# Patient Record
Sex: Female | Born: 1935
Health system: Southern US, Community
[De-identification: ages and names within clinical notes are randomized; demographics above are authoritative.]

## PROBLEM LIST (undated history)

## (undated) DIAGNOSIS — D500 Iron deficiency anemia secondary to blood loss (chronic): Principal | ICD-10-CM

## (undated) DIAGNOSIS — K635 Polyp of colon: Secondary | ICD-10-CM

## (undated) DIAGNOSIS — H353220 Exudative age-related macular degeneration, left eye, stage unspecified: Secondary | ICD-10-CM

## (undated) DIAGNOSIS — K219 Gastro-esophageal reflux disease without esophagitis: Secondary | ICD-10-CM

## (undated) DIAGNOSIS — F419 Anxiety disorder, unspecified: Secondary | ICD-10-CM

## (undated) DIAGNOSIS — N289 Disorder of kidney and ureter, unspecified: Secondary | ICD-10-CM

## (undated) DIAGNOSIS — C069 Malignant neoplasm of mouth, unspecified: Secondary | ICD-10-CM

## (undated) DIAGNOSIS — G459 Transient cerebral ischemic attack, unspecified: Secondary | ICD-10-CM

## (undated) DIAGNOSIS — Z7189 Other specified counseling: Secondary | ICD-10-CM

## (undated) DIAGNOSIS — C320 Malignant neoplasm of glottis: Secondary | ICD-10-CM

## (undated) DIAGNOSIS — J189 Pneumonia, unspecified organism: Secondary | ICD-10-CM

## (undated) DIAGNOSIS — F32A Depression, unspecified: Secondary | ICD-10-CM

## (undated) DIAGNOSIS — M1990 Unspecified osteoarthritis, unspecified site: Secondary | ICD-10-CM

## (undated) DIAGNOSIS — M545 Low back pain, unspecified: Secondary | ICD-10-CM

## (undated) DIAGNOSIS — Z9289 Personal history of other medical treatment: Secondary | ICD-10-CM

## (undated) DIAGNOSIS — G8929 Other chronic pain: Secondary | ICD-10-CM

## (undated) DIAGNOSIS — F329 Major depressive disorder, single episode, unspecified: Secondary | ICD-10-CM

## (undated) DIAGNOSIS — K909 Intestinal malabsorption, unspecified: Secondary | ICD-10-CM

## (undated) DIAGNOSIS — Z8739 Personal history of other diseases of the musculoskeletal system and connective tissue: Secondary | ICD-10-CM

## (undated) DIAGNOSIS — B019 Varicella without complication: Secondary | ICD-10-CM

## (undated) DIAGNOSIS — I739 Peripheral vascular disease, unspecified: Secondary | ICD-10-CM

## (undated) DIAGNOSIS — D649 Anemia, unspecified: Secondary | ICD-10-CM

## (undated) DIAGNOSIS — I779 Disorder of arteries and arterioles, unspecified: Secondary | ICD-10-CM

## (undated) DIAGNOSIS — I10 Essential (primary) hypertension: Secondary | ICD-10-CM

## (undated) DIAGNOSIS — K5792 Diverticulitis of intestine, part unspecified, without perforation or abscess without bleeding: Secondary | ICD-10-CM

## (undated) DIAGNOSIS — H353110 Nonexudative age-related macular degeneration, right eye, stage unspecified: Secondary | ICD-10-CM

## (undated) HISTORY — DX: Intestinal malabsorption, unspecified: K90.9

## (undated) HISTORY — DX: Other specified counseling: Z71.89

## (undated) HISTORY — DX: Depression, unspecified: F32.A

## (undated) HISTORY — PX: JOINT REPLACEMENT: SHX530

## (undated) HISTORY — DX: Essential (primary) hypertension: I10

## (undated) HISTORY — PX: DILATION AND CURETTAGE OF UTERUS: SHX78

## (undated) HISTORY — DX: Disorder of arteries and arterioles, unspecified: I77.9

## (undated) HISTORY — DX: Unspecified osteoarthritis, unspecified site: M19.90

## (undated) HISTORY — DX: Major depressive disorder, single episode, unspecified: F32.9

## (undated) HISTORY — DX: Varicella without complication: B01.9

## (undated) HISTORY — DX: Iron deficiency anemia secondary to blood loss (chronic): D50.0

## (undated) HISTORY — DX: Peripheral vascular disease, unspecified: I73.9

## (undated) HISTORY — DX: Gastro-esophageal reflux disease without esophagitis: K21.9

## (undated) HISTORY — DX: Malignant neoplasm of mouth, unspecified: C06.9

## (undated) HISTORY — PX: CATARACT EXTRACTION W/ INTRAOCULAR LENS  IMPLANT, BILATERAL: SHX1307

## (undated) HISTORY — DX: Diverticulitis of intestine, part unspecified, without perforation or abscess without bleeding: K57.92

## (undated) HISTORY — PX: CAROTID ENDARTERECTOMY: SUR193

## (undated) HISTORY — DX: Disorder of kidney and ureter, unspecified: N28.9

## (undated) HISTORY — DX: Polyp of colon: K63.5

---
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
## 1974-03-04 HISTORY — PX: ABDOMINAL HYSTERECTOMY: SHX81

## 2011-03-05 HISTORY — PX: TOTAL HIP ARTHROPLASTY: SHX124

## 2011-04-03 DIAGNOSIS — H43399 Other vitreous opacities, unspecified eye: Secondary | ICD-10-CM | POA: Diagnosis not present

## 2011-04-03 DIAGNOSIS — H43819 Vitreous degeneration, unspecified eye: Secondary | ICD-10-CM | POA: Diagnosis not present

## 2011-04-03 DIAGNOSIS — H3530 Unspecified macular degeneration: Secondary | ICD-10-CM | POA: Diagnosis not present

## 2011-04-12 DIAGNOSIS — R55 Syncope and collapse: Secondary | ICD-10-CM | POA: Diagnosis not present

## 2011-04-12 DIAGNOSIS — Z8673 Personal history of transient ischemic attack (TIA), and cerebral infarction without residual deficits: Secondary | ICD-10-CM | POA: Diagnosis not present

## 2011-04-12 DIAGNOSIS — W19XXXA Unspecified fall, initial encounter: Secondary | ICD-10-CM | POA: Diagnosis not present

## 2011-04-12 DIAGNOSIS — S0993XA Unspecified injury of face, initial encounter: Secondary | ICD-10-CM | POA: Diagnosis not present

## 2011-04-13 DIAGNOSIS — I517 Cardiomegaly: Secondary | ICD-10-CM | POA: Diagnosis not present

## 2011-04-13 DIAGNOSIS — D72829 Elevated white blood cell count, unspecified: Secondary | ICD-10-CM | POA: Diagnosis not present

## 2011-04-13 DIAGNOSIS — D649 Anemia, unspecified: Secondary | ICD-10-CM | POA: Diagnosis not present

## 2011-04-13 DIAGNOSIS — N390 Urinary tract infection, site not specified: Secondary | ICD-10-CM | POA: Diagnosis not present

## 2011-04-13 DIAGNOSIS — I359 Nonrheumatic aortic valve disorder, unspecified: Secondary | ICD-10-CM | POA: Diagnosis not present

## 2011-04-13 DIAGNOSIS — I7090 Unspecified atherosclerosis: Secondary | ICD-10-CM | POA: Diagnosis not present

## 2011-04-13 DIAGNOSIS — I519 Heart disease, unspecified: Secondary | ICD-10-CM | POA: Diagnosis not present

## 2011-04-13 DIAGNOSIS — R55 Syncope and collapse: Secondary | ICD-10-CM | POA: Diagnosis not present

## 2011-04-14 DIAGNOSIS — S0990XA Unspecified injury of head, initial encounter: Secondary | ICD-10-CM | POA: Diagnosis not present

## 2011-04-14 DIAGNOSIS — R55 Syncope and collapse: Secondary | ICD-10-CM | POA: Diagnosis not present

## 2011-04-14 DIAGNOSIS — N390 Urinary tract infection, site not specified: Secondary | ICD-10-CM | POA: Diagnosis not present

## 2011-04-14 DIAGNOSIS — I10 Essential (primary) hypertension: Secondary | ICD-10-CM | POA: Diagnosis not present

## 2011-04-14 DIAGNOSIS — D72829 Elevated white blood cell count, unspecified: Secondary | ICD-10-CM | POA: Diagnosis not present

## 2011-04-15 DIAGNOSIS — I6509 Occlusion and stenosis of unspecified vertebral artery: Secondary | ICD-10-CM | POA: Diagnosis not present

## 2011-04-15 DIAGNOSIS — D649 Anemia, unspecified: Secondary | ICD-10-CM | POA: Diagnosis not present

## 2011-04-15 DIAGNOSIS — R55 Syncope and collapse: Secondary | ICD-10-CM | POA: Diagnosis not present

## 2011-04-15 DIAGNOSIS — D72829 Elevated white blood cell count, unspecified: Secondary | ICD-10-CM | POA: Diagnosis not present

## 2011-04-15 DIAGNOSIS — N390 Urinary tract infection, site not specified: Secondary | ICD-10-CM | POA: Diagnosis not present

## 2011-04-16 DIAGNOSIS — R55 Syncope and collapse: Secondary | ICD-10-CM | POA: Diagnosis not present

## 2011-04-16 DIAGNOSIS — D72829 Elevated white blood cell count, unspecified: Secondary | ICD-10-CM | POA: Diagnosis not present

## 2011-04-16 DIAGNOSIS — N390 Urinary tract infection, site not specified: Secondary | ICD-10-CM | POA: Diagnosis not present

## 2011-04-17 DIAGNOSIS — D72829 Elevated white blood cell count, unspecified: Secondary | ICD-10-CM | POA: Diagnosis not present

## 2011-04-17 DIAGNOSIS — R55 Syncope and collapse: Secondary | ICD-10-CM | POA: Diagnosis not present

## 2011-04-17 DIAGNOSIS — N390 Urinary tract infection, site not specified: Secondary | ICD-10-CM | POA: Diagnosis not present

## 2011-04-18 DIAGNOSIS — N390 Urinary tract infection, site not specified: Secondary | ICD-10-CM | POA: Diagnosis not present

## 2011-04-18 DIAGNOSIS — M545 Low back pain: Secondary | ICD-10-CM | POA: Diagnosis not present

## 2011-04-18 DIAGNOSIS — R55 Syncope and collapse: Secondary | ICD-10-CM | POA: Diagnosis not present

## 2011-04-22 DIAGNOSIS — I129 Hypertensive chronic kidney disease with stage 1 through stage 4 chronic kidney disease, or unspecified chronic kidney disease: Secondary | ICD-10-CM | POA: Diagnosis not present

## 2011-04-22 DIAGNOSIS — IMO0001 Reserved for inherently not codable concepts without codable children: Secondary | ICD-10-CM | POA: Diagnosis not present

## 2011-04-22 DIAGNOSIS — G8929 Other chronic pain: Secondary | ICD-10-CM | POA: Diagnosis not present

## 2011-04-22 DIAGNOSIS — R262 Difficulty in walking, not elsewhere classified: Secondary | ICD-10-CM | POA: Diagnosis not present

## 2011-04-22 DIAGNOSIS — M545 Low back pain: Secondary | ICD-10-CM | POA: Diagnosis not present

## 2011-04-22 DIAGNOSIS — M1990 Unspecified osteoarthritis, unspecified site: Secondary | ICD-10-CM | POA: Diagnosis not present

## 2011-04-24 DIAGNOSIS — G8929 Other chronic pain: Secondary | ICD-10-CM | POA: Diagnosis not present

## 2011-04-24 DIAGNOSIS — M545 Low back pain: Secondary | ICD-10-CM | POA: Diagnosis not present

## 2011-04-24 DIAGNOSIS — I129 Hypertensive chronic kidney disease with stage 1 through stage 4 chronic kidney disease, or unspecified chronic kidney disease: Secondary | ICD-10-CM | POA: Diagnosis not present

## 2011-04-24 DIAGNOSIS — R262 Difficulty in walking, not elsewhere classified: Secondary | ICD-10-CM | POA: Diagnosis not present

## 2011-04-24 DIAGNOSIS — IMO0001 Reserved for inherently not codable concepts without codable children: Secondary | ICD-10-CM | POA: Diagnosis not present

## 2011-04-24 DIAGNOSIS — M1990 Unspecified osteoarthritis, unspecified site: Secondary | ICD-10-CM | POA: Diagnosis not present

## 2011-04-26 DIAGNOSIS — R109 Unspecified abdominal pain: Secondary | ICD-10-CM | POA: Diagnosis not present

## 2011-04-26 DIAGNOSIS — R110 Nausea: Secondary | ICD-10-CM | POA: Diagnosis not present

## 2011-04-26 DIAGNOSIS — K5900 Constipation, unspecified: Secondary | ICD-10-CM | POA: Diagnosis not present

## 2011-04-26 DIAGNOSIS — F17200 Nicotine dependence, unspecified, uncomplicated: Secondary | ICD-10-CM | POA: Diagnosis not present

## 2011-04-26 DIAGNOSIS — K921 Melena: Secondary | ICD-10-CM | POA: Diagnosis not present

## 2011-04-26 DIAGNOSIS — Z885 Allergy status to narcotic agent status: Secondary | ICD-10-CM | POA: Diagnosis not present

## 2011-04-26 DIAGNOSIS — R000 Tachycardia, unspecified: Secondary | ICD-10-CM | POA: Diagnosis not present

## 2011-04-29 DIAGNOSIS — I129 Hypertensive chronic kidney disease with stage 1 through stage 4 chronic kidney disease, or unspecified chronic kidney disease: Secondary | ICD-10-CM | POA: Diagnosis not present

## 2011-04-29 DIAGNOSIS — R262 Difficulty in walking, not elsewhere classified: Secondary | ICD-10-CM | POA: Diagnosis not present

## 2011-04-29 DIAGNOSIS — G8929 Other chronic pain: Secondary | ICD-10-CM | POA: Diagnosis not present

## 2011-04-29 DIAGNOSIS — M545 Low back pain: Secondary | ICD-10-CM | POA: Diagnosis not present

## 2011-04-29 DIAGNOSIS — IMO0001 Reserved for inherently not codable concepts without codable children: Secondary | ICD-10-CM | POA: Diagnosis not present

## 2011-04-29 DIAGNOSIS — M1990 Unspecified osteoarthritis, unspecified site: Secondary | ICD-10-CM | POA: Diagnosis not present

## 2011-04-30 DIAGNOSIS — R000 Tachycardia, unspecified: Secondary | ICD-10-CM | POA: Diagnosis not present

## 2011-04-30 DIAGNOSIS — I6529 Occlusion and stenosis of unspecified carotid artery: Secondary | ICD-10-CM | POA: Diagnosis not present

## 2011-05-01 DIAGNOSIS — I129 Hypertensive chronic kidney disease with stage 1 through stage 4 chronic kidney disease, or unspecified chronic kidney disease: Secondary | ICD-10-CM | POA: Diagnosis not present

## 2011-05-01 DIAGNOSIS — M1990 Unspecified osteoarthritis, unspecified site: Secondary | ICD-10-CM | POA: Diagnosis not present

## 2011-05-01 DIAGNOSIS — R262 Difficulty in walking, not elsewhere classified: Secondary | ICD-10-CM | POA: Diagnosis not present

## 2011-05-01 DIAGNOSIS — IMO0001 Reserved for inherently not codable concepts without codable children: Secondary | ICD-10-CM | POA: Diagnosis not present

## 2011-05-01 DIAGNOSIS — M545 Low back pain: Secondary | ICD-10-CM | POA: Diagnosis not present

## 2011-05-01 DIAGNOSIS — G8929 Other chronic pain: Secondary | ICD-10-CM | POA: Diagnosis not present

## 2011-05-06 DIAGNOSIS — R262 Difficulty in walking, not elsewhere classified: Secondary | ICD-10-CM | POA: Diagnosis not present

## 2011-05-06 DIAGNOSIS — I129 Hypertensive chronic kidney disease with stage 1 through stage 4 chronic kidney disease, or unspecified chronic kidney disease: Secondary | ICD-10-CM | POA: Diagnosis not present

## 2011-05-06 DIAGNOSIS — G8929 Other chronic pain: Secondary | ICD-10-CM | POA: Diagnosis not present

## 2011-05-06 DIAGNOSIS — IMO0001 Reserved for inherently not codable concepts without codable children: Secondary | ICD-10-CM | POA: Diagnosis not present

## 2011-05-06 DIAGNOSIS — M1990 Unspecified osteoarthritis, unspecified site: Secondary | ICD-10-CM | POA: Diagnosis not present

## 2011-05-06 DIAGNOSIS — M545 Low back pain: Secondary | ICD-10-CM | POA: Diagnosis not present

## 2011-05-08 DIAGNOSIS — M1990 Unspecified osteoarthritis, unspecified site: Secondary | ICD-10-CM | POA: Diagnosis not present

## 2011-05-08 DIAGNOSIS — I129 Hypertensive chronic kidney disease with stage 1 through stage 4 chronic kidney disease, or unspecified chronic kidney disease: Secondary | ICD-10-CM | POA: Diagnosis not present

## 2011-05-08 DIAGNOSIS — IMO0001 Reserved for inherently not codable concepts without codable children: Secondary | ICD-10-CM | POA: Diagnosis not present

## 2011-05-08 DIAGNOSIS — G8929 Other chronic pain: Secondary | ICD-10-CM | POA: Diagnosis not present

## 2011-05-08 DIAGNOSIS — R262 Difficulty in walking, not elsewhere classified: Secondary | ICD-10-CM | POA: Diagnosis not present

## 2011-05-08 DIAGNOSIS — M545 Low back pain: Secondary | ICD-10-CM | POA: Diagnosis not present

## 2011-05-15 DIAGNOSIS — IMO0001 Reserved for inherently not codable concepts without codable children: Secondary | ICD-10-CM | POA: Diagnosis not present

## 2011-05-15 DIAGNOSIS — R262 Difficulty in walking, not elsewhere classified: Secondary | ICD-10-CM | POA: Diagnosis not present

## 2011-05-15 DIAGNOSIS — M545 Low back pain: Secondary | ICD-10-CM | POA: Diagnosis not present

## 2011-05-15 DIAGNOSIS — M1990 Unspecified osteoarthritis, unspecified site: Secondary | ICD-10-CM | POA: Diagnosis not present

## 2011-05-15 DIAGNOSIS — G8929 Other chronic pain: Secondary | ICD-10-CM | POA: Diagnosis not present

## 2011-05-15 DIAGNOSIS — I129 Hypertensive chronic kidney disease with stage 1 through stage 4 chronic kidney disease, or unspecified chronic kidney disease: Secondary | ICD-10-CM | POA: Diagnosis not present

## 2011-05-17 DIAGNOSIS — M1990 Unspecified osteoarthritis, unspecified site: Secondary | ICD-10-CM | POA: Diagnosis not present

## 2011-05-17 DIAGNOSIS — M545 Low back pain: Secondary | ICD-10-CM | POA: Diagnosis not present

## 2011-05-17 DIAGNOSIS — IMO0001 Reserved for inherently not codable concepts without codable children: Secondary | ICD-10-CM | POA: Diagnosis not present

## 2011-05-17 DIAGNOSIS — R262 Difficulty in walking, not elsewhere classified: Secondary | ICD-10-CM | POA: Diagnosis not present

## 2011-05-17 DIAGNOSIS — I129 Hypertensive chronic kidney disease with stage 1 through stage 4 chronic kidney disease, or unspecified chronic kidney disease: Secondary | ICD-10-CM | POA: Diagnosis not present

## 2011-05-17 DIAGNOSIS — G8929 Other chronic pain: Secondary | ICD-10-CM | POA: Diagnosis not present

## 2011-05-20 DIAGNOSIS — I129 Hypertensive chronic kidney disease with stage 1 through stage 4 chronic kidney disease, or unspecified chronic kidney disease: Secondary | ICD-10-CM | POA: Diagnosis not present

## 2011-05-20 DIAGNOSIS — M545 Low back pain: Secondary | ICD-10-CM | POA: Diagnosis not present

## 2011-05-20 DIAGNOSIS — R262 Difficulty in walking, not elsewhere classified: Secondary | ICD-10-CM | POA: Diagnosis not present

## 2011-05-20 DIAGNOSIS — IMO0001 Reserved for inherently not codable concepts without codable children: Secondary | ICD-10-CM | POA: Diagnosis not present

## 2011-05-20 DIAGNOSIS — M1990 Unspecified osteoarthritis, unspecified site: Secondary | ICD-10-CM | POA: Diagnosis not present

## 2011-05-20 DIAGNOSIS — G8929 Other chronic pain: Secondary | ICD-10-CM | POA: Diagnosis not present

## 2011-05-22 DIAGNOSIS — M1990 Unspecified osteoarthritis, unspecified site: Secondary | ICD-10-CM | POA: Diagnosis not present

## 2011-05-22 DIAGNOSIS — G8929 Other chronic pain: Secondary | ICD-10-CM | POA: Diagnosis not present

## 2011-05-22 DIAGNOSIS — M545 Low back pain: Secondary | ICD-10-CM | POA: Diagnosis not present

## 2011-05-22 DIAGNOSIS — I129 Hypertensive chronic kidney disease with stage 1 through stage 4 chronic kidney disease, or unspecified chronic kidney disease: Secondary | ICD-10-CM | POA: Diagnosis not present

## 2011-05-22 DIAGNOSIS — IMO0001 Reserved for inherently not codable concepts without codable children: Secondary | ICD-10-CM | POA: Diagnosis not present

## 2011-05-22 DIAGNOSIS — R262 Difficulty in walking, not elsewhere classified: Secondary | ICD-10-CM | POA: Diagnosis not present

## 2011-05-27 DIAGNOSIS — IMO0001 Reserved for inherently not codable concepts without codable children: Secondary | ICD-10-CM | POA: Diagnosis not present

## 2011-05-27 DIAGNOSIS — M1990 Unspecified osteoarthritis, unspecified site: Secondary | ICD-10-CM | POA: Diagnosis not present

## 2011-05-27 DIAGNOSIS — G8929 Other chronic pain: Secondary | ICD-10-CM | POA: Diagnosis not present

## 2011-05-27 DIAGNOSIS — I129 Hypertensive chronic kidney disease with stage 1 through stage 4 chronic kidney disease, or unspecified chronic kidney disease: Secondary | ICD-10-CM | POA: Diagnosis not present

## 2011-05-27 DIAGNOSIS — M545 Low back pain: Secondary | ICD-10-CM | POA: Diagnosis not present

## 2011-05-27 DIAGNOSIS — R262 Difficulty in walking, not elsewhere classified: Secondary | ICD-10-CM | POA: Diagnosis not present

## 2011-05-29 DIAGNOSIS — IMO0001 Reserved for inherently not codable concepts without codable children: Secondary | ICD-10-CM | POA: Diagnosis not present

## 2011-05-29 DIAGNOSIS — G8929 Other chronic pain: Secondary | ICD-10-CM | POA: Diagnosis not present

## 2011-05-29 DIAGNOSIS — M545 Low back pain: Secondary | ICD-10-CM | POA: Diagnosis not present

## 2011-05-29 DIAGNOSIS — I129 Hypertensive chronic kidney disease with stage 1 through stage 4 chronic kidney disease, or unspecified chronic kidney disease: Secondary | ICD-10-CM | POA: Diagnosis not present

## 2011-05-29 DIAGNOSIS — M1990 Unspecified osteoarthritis, unspecified site: Secondary | ICD-10-CM | POA: Diagnosis not present

## 2011-05-29 DIAGNOSIS — R262 Difficulty in walking, not elsewhere classified: Secondary | ICD-10-CM | POA: Diagnosis not present

## 2011-06-02 DIAGNOSIS — IMO0001 Reserved for inherently not codable concepts without codable children: Secondary | ICD-10-CM | POA: Diagnosis not present

## 2011-06-03 DIAGNOSIS — I129 Hypertensive chronic kidney disease with stage 1 through stage 4 chronic kidney disease, or unspecified chronic kidney disease: Secondary | ICD-10-CM | POA: Diagnosis not present

## 2011-06-03 DIAGNOSIS — IMO0001 Reserved for inherently not codable concepts without codable children: Secondary | ICD-10-CM | POA: Diagnosis not present

## 2011-06-03 DIAGNOSIS — M545 Low back pain: Secondary | ICD-10-CM | POA: Diagnosis not present

## 2011-06-03 DIAGNOSIS — M1990 Unspecified osteoarthritis, unspecified site: Secondary | ICD-10-CM | POA: Diagnosis not present

## 2011-06-03 DIAGNOSIS — G8929 Other chronic pain: Secondary | ICD-10-CM | POA: Diagnosis not present

## 2011-06-03 DIAGNOSIS — R262 Difficulty in walking, not elsewhere classified: Secondary | ICD-10-CM | POA: Diagnosis not present

## 2011-06-05 DIAGNOSIS — M545 Low back pain: Secondary | ICD-10-CM | POA: Diagnosis not present

## 2011-06-05 DIAGNOSIS — I129 Hypertensive chronic kidney disease with stage 1 through stage 4 chronic kidney disease, or unspecified chronic kidney disease: Secondary | ICD-10-CM | POA: Diagnosis not present

## 2011-06-05 DIAGNOSIS — IMO0001 Reserved for inherently not codable concepts without codable children: Secondary | ICD-10-CM | POA: Diagnosis not present

## 2011-06-05 DIAGNOSIS — R262 Difficulty in walking, not elsewhere classified: Secondary | ICD-10-CM | POA: Diagnosis not present

## 2011-06-05 DIAGNOSIS — M1990 Unspecified osteoarthritis, unspecified site: Secondary | ICD-10-CM | POA: Diagnosis not present

## 2011-06-05 DIAGNOSIS — R0989 Other specified symptoms and signs involving the circulatory and respiratory systems: Secondary | ICD-10-CM | POA: Diagnosis not present

## 2011-06-05 DIAGNOSIS — I10 Essential (primary) hypertension: Secondary | ICD-10-CM | POA: Diagnosis not present

## 2011-06-05 DIAGNOSIS — I6529 Occlusion and stenosis of unspecified carotid artery: Secondary | ICD-10-CM | POA: Diagnosis not present

## 2011-06-05 DIAGNOSIS — G8929 Other chronic pain: Secondary | ICD-10-CM | POA: Diagnosis not present

## 2011-06-05 DIAGNOSIS — I70209 Unspecified atherosclerosis of native arteries of extremities, unspecified extremity: Secondary | ICD-10-CM | POA: Diagnosis not present

## 2011-06-06 DIAGNOSIS — H353290 Exudative age-related macular degeneration, unspecified eye, stage unspecified: Secondary | ICD-10-CM | POA: Diagnosis not present

## 2011-06-06 DIAGNOSIS — H353190 Nonexudative age-related macular degeneration, unspecified eye, stage unspecified: Secondary | ICD-10-CM | POA: Diagnosis not present

## 2011-06-08 DIAGNOSIS — I129 Hypertensive chronic kidney disease with stage 1 through stage 4 chronic kidney disease, or unspecified chronic kidney disease: Secondary | ICD-10-CM | POA: Diagnosis present

## 2011-06-08 DIAGNOSIS — I63239 Cerebral infarction due to unspecified occlusion or stenosis of unspecified carotid arteries: Secondary | ICD-10-CM | POA: Diagnosis not present

## 2011-06-08 DIAGNOSIS — I6529 Occlusion and stenosis of unspecified carotid artery: Secondary | ICD-10-CM | POA: Diagnosis not present

## 2011-06-08 DIAGNOSIS — Z5309 Procedure and treatment not carried out because of other contraindication: Secondary | ICD-10-CM | POA: Diagnosis not present

## 2011-06-12 DIAGNOSIS — M1990 Unspecified osteoarthritis, unspecified site: Secondary | ICD-10-CM | POA: Diagnosis not present

## 2011-06-12 DIAGNOSIS — M545 Low back pain: Secondary | ICD-10-CM | POA: Diagnosis not present

## 2011-06-12 DIAGNOSIS — G8929 Other chronic pain: Secondary | ICD-10-CM | POA: Diagnosis not present

## 2011-06-12 DIAGNOSIS — I129 Hypertensive chronic kidney disease with stage 1 through stage 4 chronic kidney disease, or unspecified chronic kidney disease: Secondary | ICD-10-CM | POA: Diagnosis not present

## 2011-06-12 DIAGNOSIS — IMO0001 Reserved for inherently not codable concepts without codable children: Secondary | ICD-10-CM | POA: Diagnosis not present

## 2011-06-12 DIAGNOSIS — R262 Difficulty in walking, not elsewhere classified: Secondary | ICD-10-CM | POA: Diagnosis not present

## 2011-06-14 DIAGNOSIS — M545 Low back pain: Secondary | ICD-10-CM | POA: Diagnosis not present

## 2011-06-14 DIAGNOSIS — G8929 Other chronic pain: Secondary | ICD-10-CM | POA: Diagnosis not present

## 2011-06-14 DIAGNOSIS — R262 Difficulty in walking, not elsewhere classified: Secondary | ICD-10-CM | POA: Diagnosis not present

## 2011-06-14 DIAGNOSIS — M1990 Unspecified osteoarthritis, unspecified site: Secondary | ICD-10-CM | POA: Diagnosis not present

## 2011-06-14 DIAGNOSIS — I129 Hypertensive chronic kidney disease with stage 1 through stage 4 chronic kidney disease, or unspecified chronic kidney disease: Secondary | ICD-10-CM | POA: Diagnosis not present

## 2011-06-14 DIAGNOSIS — IMO0001 Reserved for inherently not codable concepts without codable children: Secondary | ICD-10-CM | POA: Diagnosis not present

## 2011-06-17 DIAGNOSIS — R262 Difficulty in walking, not elsewhere classified: Secondary | ICD-10-CM | POA: Diagnosis not present

## 2011-06-17 DIAGNOSIS — M1990 Unspecified osteoarthritis, unspecified site: Secondary | ICD-10-CM | POA: Diagnosis not present

## 2011-06-17 DIAGNOSIS — G8929 Other chronic pain: Secondary | ICD-10-CM | POA: Diagnosis not present

## 2011-06-17 DIAGNOSIS — M545 Low back pain: Secondary | ICD-10-CM | POA: Diagnosis not present

## 2011-06-17 DIAGNOSIS — I129 Hypertensive chronic kidney disease with stage 1 through stage 4 chronic kidney disease, or unspecified chronic kidney disease: Secondary | ICD-10-CM | POA: Diagnosis not present

## 2011-06-17 DIAGNOSIS — IMO0001 Reserved for inherently not codable concepts without codable children: Secondary | ICD-10-CM | POA: Diagnosis not present

## 2011-06-19 DIAGNOSIS — R262 Difficulty in walking, not elsewhere classified: Secondary | ICD-10-CM | POA: Diagnosis not present

## 2011-06-19 DIAGNOSIS — G8929 Other chronic pain: Secondary | ICD-10-CM | POA: Diagnosis not present

## 2011-06-19 DIAGNOSIS — I129 Hypertensive chronic kidney disease with stage 1 through stage 4 chronic kidney disease, or unspecified chronic kidney disease: Secondary | ICD-10-CM | POA: Diagnosis not present

## 2011-06-19 DIAGNOSIS — IMO0001 Reserved for inherently not codable concepts without codable children: Secondary | ICD-10-CM | POA: Diagnosis not present

## 2011-06-19 DIAGNOSIS — M545 Low back pain: Secondary | ICD-10-CM | POA: Diagnosis not present

## 2011-06-19 DIAGNOSIS — M1990 Unspecified osteoarthritis, unspecified site: Secondary | ICD-10-CM | POA: Diagnosis not present

## 2011-06-28 DIAGNOSIS — I10 Essential (primary) hypertension: Secondary | ICD-10-CM | POA: Diagnosis not present

## 2011-06-28 DIAGNOSIS — G4700 Insomnia, unspecified: Secondary | ICD-10-CM | POA: Diagnosis not present

## 2011-06-28 DIAGNOSIS — R627 Adult failure to thrive: Secondary | ICD-10-CM | POA: Diagnosis not present

## 2011-07-04 DIAGNOSIS — H43819 Vitreous degeneration, unspecified eye: Secondary | ICD-10-CM | POA: Diagnosis not present

## 2011-07-04 DIAGNOSIS — H353190 Nonexudative age-related macular degeneration, unspecified eye, stage unspecified: Secondary | ICD-10-CM | POA: Diagnosis not present

## 2011-07-04 DIAGNOSIS — R7989 Other specified abnormal findings of blood chemistry: Secondary | ICD-10-CM | POA: Diagnosis not present

## 2011-07-04 DIAGNOSIS — H353290 Exudative age-related macular degeneration, unspecified eye, stage unspecified: Secondary | ICD-10-CM | POA: Diagnosis not present

## 2011-07-04 DIAGNOSIS — R7301 Impaired fasting glucose: Secondary | ICD-10-CM | POA: Diagnosis not present

## 2011-07-12 DIAGNOSIS — R634 Abnormal weight loss: Secondary | ICD-10-CM | POA: Diagnosis not present

## 2011-07-12 DIAGNOSIS — I10 Essential (primary) hypertension: Secondary | ICD-10-CM | POA: Diagnosis not present

## 2011-07-12 DIAGNOSIS — R110 Nausea: Secondary | ICD-10-CM | POA: Diagnosis not present

## 2011-07-12 DIAGNOSIS — G4700 Insomnia, unspecified: Secondary | ICD-10-CM | POA: Diagnosis not present

## 2011-07-12 DIAGNOSIS — M109 Gout, unspecified: Secondary | ICD-10-CM | POA: Diagnosis not present

## 2011-07-15 DIAGNOSIS — B351 Tinea unguium: Secondary | ICD-10-CM | POA: Diagnosis not present

## 2011-07-15 DIAGNOSIS — I739 Peripheral vascular disease, unspecified: Secondary | ICD-10-CM | POA: Diagnosis not present

## 2011-08-12 DIAGNOSIS — H353190 Nonexudative age-related macular degeneration, unspecified eye, stage unspecified: Secondary | ICD-10-CM | POA: Diagnosis not present

## 2011-08-12 DIAGNOSIS — H43819 Vitreous degeneration, unspecified eye: Secondary | ICD-10-CM | POA: Diagnosis not present

## 2011-08-12 DIAGNOSIS — H353290 Exudative age-related macular degeneration, unspecified eye, stage unspecified: Secondary | ICD-10-CM | POA: Diagnosis not present

## 2011-08-20 DIAGNOSIS — M109 Gout, unspecified: Secondary | ICD-10-CM | POA: Diagnosis not present

## 2011-08-20 DIAGNOSIS — R634 Abnormal weight loss: Secondary | ICD-10-CM | POA: Diagnosis not present

## 2011-08-20 DIAGNOSIS — R630 Anorexia: Secondary | ICD-10-CM | POA: Diagnosis not present

## 2011-08-20 DIAGNOSIS — R110 Nausea: Secondary | ICD-10-CM | POA: Diagnosis not present

## 2011-08-20 DIAGNOSIS — I10 Essential (primary) hypertension: Secondary | ICD-10-CM | POA: Diagnosis not present

## 2011-08-20 DIAGNOSIS — G4700 Insomnia, unspecified: Secondary | ICD-10-CM | POA: Diagnosis not present

## 2011-09-23 DIAGNOSIS — H353190 Nonexudative age-related macular degeneration, unspecified eye, stage unspecified: Secondary | ICD-10-CM | POA: Diagnosis not present

## 2011-09-23 DIAGNOSIS — H43819 Vitreous degeneration, unspecified eye: Secondary | ICD-10-CM | POA: Diagnosis not present

## 2011-09-23 DIAGNOSIS — H353290 Exudative age-related macular degeneration, unspecified eye, stage unspecified: Secondary | ICD-10-CM | POA: Diagnosis not present

## 2011-11-18 DIAGNOSIS — H353190 Nonexudative age-related macular degeneration, unspecified eye, stage unspecified: Secondary | ICD-10-CM | POA: Diagnosis not present

## 2011-11-18 DIAGNOSIS — H353290 Exudative age-related macular degeneration, unspecified eye, stage unspecified: Secondary | ICD-10-CM | POA: Diagnosis not present

## 2011-12-11 DIAGNOSIS — K219 Gastro-esophageal reflux disease without esophagitis: Secondary | ICD-10-CM | POA: Diagnosis not present

## 2011-12-11 DIAGNOSIS — E119 Type 2 diabetes mellitus without complications: Secondary | ICD-10-CM | POA: Diagnosis not present

## 2011-12-11 DIAGNOSIS — E039 Hypothyroidism, unspecified: Secondary | ICD-10-CM | POA: Diagnosis not present

## 2011-12-25 DIAGNOSIS — Z23 Encounter for immunization: Secondary | ICD-10-CM | POA: Diagnosis not present

## 2012-01-27 DIAGNOSIS — H353190 Nonexudative age-related macular degeneration, unspecified eye, stage unspecified: Secondary | ICD-10-CM | POA: Diagnosis not present

## 2012-01-27 DIAGNOSIS — H353290 Exudative age-related macular degeneration, unspecified eye, stage unspecified: Secondary | ICD-10-CM | POA: Diagnosis not present

## 2012-02-03 DIAGNOSIS — D1801 Hemangioma of skin and subcutaneous tissue: Secondary | ICD-10-CM | POA: Diagnosis not present

## 2012-02-03 DIAGNOSIS — L738 Other specified follicular disorders: Secondary | ICD-10-CM | POA: Diagnosis not present

## 2012-02-03 DIAGNOSIS — L570 Actinic keratosis: Secondary | ICD-10-CM | POA: Diagnosis not present

## 2012-02-03 DIAGNOSIS — L821 Other seborrheic keratosis: Secondary | ICD-10-CM | POA: Diagnosis not present

## 2012-03-05 DIAGNOSIS — K219 Gastro-esophageal reflux disease without esophagitis: Secondary | ICD-10-CM | POA: Diagnosis not present

## 2012-03-05 DIAGNOSIS — R5381 Other malaise: Secondary | ICD-10-CM | POA: Diagnosis not present

## 2012-03-05 DIAGNOSIS — I10 Essential (primary) hypertension: Secondary | ICD-10-CM | POA: Diagnosis not present

## 2012-04-23 DIAGNOSIS — I6529 Occlusion and stenosis of unspecified carotid artery: Secondary | ICD-10-CM | POA: Diagnosis not present

## 2012-04-23 DIAGNOSIS — R110 Nausea: Secondary | ICD-10-CM | POA: Diagnosis not present

## 2012-04-23 DIAGNOSIS — R269 Unspecified abnormalities of gait and mobility: Secondary | ICD-10-CM | POA: Diagnosis not present

## 2012-04-23 DIAGNOSIS — I10 Essential (primary) hypertension: Secondary | ICD-10-CM | POA: Diagnosis not present

## 2012-04-23 DIAGNOSIS — K219 Gastro-esophageal reflux disease without esophagitis: Secondary | ICD-10-CM | POA: Diagnosis not present

## 2012-04-27 DIAGNOSIS — H353290 Exudative age-related macular degeneration, unspecified eye, stage unspecified: Secondary | ICD-10-CM | POA: Diagnosis not present

## 2012-04-27 DIAGNOSIS — H353190 Nonexudative age-related macular degeneration, unspecified eye, stage unspecified: Secondary | ICD-10-CM | POA: Diagnosis not present

## 2012-04-29 DIAGNOSIS — I63239 Cerebral infarction due to unspecified occlusion or stenosis of unspecified carotid arteries: Secondary | ICD-10-CM | POA: Diagnosis not present

## 2012-05-07 DIAGNOSIS — N189 Chronic kidney disease, unspecified: Secondary | ICD-10-CM | POA: Diagnosis not present

## 2012-05-07 DIAGNOSIS — I739 Peripheral vascular disease, unspecified: Secondary | ICD-10-CM | POA: Diagnosis not present

## 2012-06-08 DIAGNOSIS — G4700 Insomnia, unspecified: Secondary | ICD-10-CM | POA: Diagnosis not present

## 2012-06-08 DIAGNOSIS — N189 Chronic kidney disease, unspecified: Secondary | ICD-10-CM | POA: Diagnosis not present

## 2012-06-08 DIAGNOSIS — I739 Peripheral vascular disease, unspecified: Secondary | ICD-10-CM | POA: Diagnosis not present

## 2012-06-08 DIAGNOSIS — I10 Essential (primary) hypertension: Secondary | ICD-10-CM | POA: Diagnosis not present

## 2012-06-08 DIAGNOSIS — S92919A Unspecified fracture of unspecified toe(s), initial encounter for closed fracture: Secondary | ICD-10-CM | POA: Diagnosis not present

## 2012-06-09 DIAGNOSIS — I10 Essential (primary) hypertension: Secondary | ICD-10-CM | POA: Diagnosis not present

## 2012-06-09 DIAGNOSIS — I739 Peripheral vascular disease, unspecified: Secondary | ICD-10-CM | POA: Diagnosis not present

## 2012-06-09 DIAGNOSIS — N189 Chronic kidney disease, unspecified: Secondary | ICD-10-CM | POA: Diagnosis not present

## 2012-06-09 DIAGNOSIS — E119 Type 2 diabetes mellitus without complications: Secondary | ICD-10-CM | POA: Diagnosis not present

## 2012-06-09 DIAGNOSIS — Z139 Encounter for screening, unspecified: Secondary | ICD-10-CM | POA: Diagnosis not present

## 2012-06-09 DIAGNOSIS — F329 Major depressive disorder, single episode, unspecified: Secondary | ICD-10-CM | POA: Diagnosis not present

## 2012-06-23 DIAGNOSIS — H43399 Other vitreous opacities, unspecified eye: Secondary | ICD-10-CM | POA: Diagnosis not present

## 2012-06-23 DIAGNOSIS — H353190 Nonexudative age-related macular degeneration, unspecified eye, stage unspecified: Secondary | ICD-10-CM | POA: Diagnosis not present

## 2012-06-23 DIAGNOSIS — H353290 Exudative age-related macular degeneration, unspecified eye, stage unspecified: Secondary | ICD-10-CM | POA: Diagnosis not present

## 2012-07-22 DIAGNOSIS — I10 Essential (primary) hypertension: Secondary | ICD-10-CM | POA: Diagnosis not present

## 2012-07-22 DIAGNOSIS — S92919B Unspecified fracture of unspecified toe(s), initial encounter for open fracture: Secondary | ICD-10-CM | POA: Diagnosis not present

## 2012-07-22 DIAGNOSIS — G4700 Insomnia, unspecified: Secondary | ICD-10-CM | POA: Diagnosis not present

## 2012-07-22 DIAGNOSIS — I739 Peripheral vascular disease, unspecified: Secondary | ICD-10-CM | POA: Diagnosis not present

## 2012-07-22 DIAGNOSIS — N189 Chronic kidney disease, unspecified: Secondary | ICD-10-CM | POA: Diagnosis not present

## 2012-07-23 DIAGNOSIS — H353290 Exudative age-related macular degeneration, unspecified eye, stage unspecified: Secondary | ICD-10-CM | POA: Diagnosis not present

## 2012-07-23 DIAGNOSIS — H353190 Nonexudative age-related macular degeneration, unspecified eye, stage unspecified: Secondary | ICD-10-CM | POA: Diagnosis not present

## 2012-07-23 DIAGNOSIS — H43399 Other vitreous opacities, unspecified eye: Secondary | ICD-10-CM | POA: Diagnosis not present

## 2012-08-11 DIAGNOSIS — K219 Gastro-esophageal reflux disease without esophagitis: Secondary | ICD-10-CM | POA: Diagnosis not present

## 2012-08-11 DIAGNOSIS — I10 Essential (primary) hypertension: Secondary | ICD-10-CM | POA: Diagnosis not present

## 2012-08-11 DIAGNOSIS — G4700 Insomnia, unspecified: Secondary | ICD-10-CM | POA: Diagnosis not present

## 2012-08-11 DIAGNOSIS — I739 Peripheral vascular disease, unspecified: Secondary | ICD-10-CM | POA: Diagnosis not present

## 2012-08-11 DIAGNOSIS — N189 Chronic kidney disease, unspecified: Secondary | ICD-10-CM | POA: Diagnosis not present

## 2012-08-20 DIAGNOSIS — H353290 Exudative age-related macular degeneration, unspecified eye, stage unspecified: Secondary | ICD-10-CM | POA: Diagnosis not present

## 2012-08-20 DIAGNOSIS — H43399 Other vitreous opacities, unspecified eye: Secondary | ICD-10-CM | POA: Diagnosis not present

## 2012-08-20 DIAGNOSIS — H353190 Nonexudative age-related macular degeneration, unspecified eye, stage unspecified: Secondary | ICD-10-CM | POA: Diagnosis not present

## 2012-10-22 DIAGNOSIS — I10 Essential (primary) hypertension: Secondary | ICD-10-CM | POA: Diagnosis not present

## 2012-10-22 DIAGNOSIS — H353290 Exudative age-related macular degeneration, unspecified eye, stage unspecified: Secondary | ICD-10-CM | POA: Diagnosis not present

## 2012-10-22 DIAGNOSIS — K219 Gastro-esophageal reflux disease without esophagitis: Secondary | ICD-10-CM | POA: Diagnosis not present

## 2012-10-22 DIAGNOSIS — I739 Peripheral vascular disease, unspecified: Secondary | ICD-10-CM | POA: Diagnosis not present

## 2012-10-22 DIAGNOSIS — F17200 Nicotine dependence, unspecified, uncomplicated: Secondary | ICD-10-CM | POA: Diagnosis not present

## 2012-10-22 DIAGNOSIS — H353190 Nonexudative age-related macular degeneration, unspecified eye, stage unspecified: Secondary | ICD-10-CM | POA: Diagnosis not present

## 2012-10-22 DIAGNOSIS — R5381 Other malaise: Secondary | ICD-10-CM | POA: Diagnosis not present

## 2012-10-22 DIAGNOSIS — N189 Chronic kidney disease, unspecified: Secondary | ICD-10-CM | POA: Diagnosis not present

## 2012-10-22 DIAGNOSIS — H43399 Other vitreous opacities, unspecified eye: Secondary | ICD-10-CM | POA: Diagnosis not present

## 2012-12-01 DIAGNOSIS — N189 Chronic kidney disease, unspecified: Secondary | ICD-10-CM | POA: Diagnosis not present

## 2012-12-01 DIAGNOSIS — R109 Unspecified abdominal pain: Secondary | ICD-10-CM | POA: Diagnosis not present

## 2012-12-01 DIAGNOSIS — F17200 Nicotine dependence, unspecified, uncomplicated: Secondary | ICD-10-CM | POA: Diagnosis not present

## 2012-12-01 DIAGNOSIS — I739 Peripheral vascular disease, unspecified: Secondary | ICD-10-CM | POA: Diagnosis not present

## 2012-12-01 DIAGNOSIS — I10 Essential (primary) hypertension: Secondary | ICD-10-CM | POA: Diagnosis not present

## 2012-12-01 DIAGNOSIS — R5381 Other malaise: Secondary | ICD-10-CM | POA: Diagnosis not present

## 2012-12-01 DIAGNOSIS — K219 Gastro-esophageal reflux disease without esophagitis: Secondary | ICD-10-CM | POA: Diagnosis not present

## 2012-12-17 DIAGNOSIS — H353190 Nonexudative age-related macular degeneration, unspecified eye, stage unspecified: Secondary | ICD-10-CM | POA: Diagnosis not present

## 2012-12-17 DIAGNOSIS — H353290 Exudative age-related macular degeneration, unspecified eye, stage unspecified: Secondary | ICD-10-CM | POA: Diagnosis not present

## 2012-12-17 DIAGNOSIS — H43399 Other vitreous opacities, unspecified eye: Secondary | ICD-10-CM | POA: Diagnosis not present

## 2012-12-23 DIAGNOSIS — Z23 Encounter for immunization: Secondary | ICD-10-CM | POA: Diagnosis not present

## 2013-01-14 DIAGNOSIS — I10 Essential (primary) hypertension: Secondary | ICD-10-CM | POA: Diagnosis not present

## 2013-01-14 DIAGNOSIS — N189 Chronic kidney disease, unspecified: Secondary | ICD-10-CM | POA: Diagnosis not present

## 2013-01-14 DIAGNOSIS — I739 Peripheral vascular disease, unspecified: Secondary | ICD-10-CM | POA: Diagnosis not present

## 2013-01-14 DIAGNOSIS — R5381 Other malaise: Secondary | ICD-10-CM | POA: Diagnosis not present

## 2013-01-14 DIAGNOSIS — K219 Gastro-esophageal reflux disease without esophagitis: Secondary | ICD-10-CM | POA: Diagnosis not present

## 2013-01-14 DIAGNOSIS — D649 Anemia, unspecified: Secondary | ICD-10-CM | POA: Diagnosis not present

## 2013-01-14 DIAGNOSIS — F329 Major depressive disorder, single episode, unspecified: Secondary | ICD-10-CM | POA: Diagnosis not present

## 2013-02-05 DIAGNOSIS — H43819 Vitreous degeneration, unspecified eye: Secondary | ICD-10-CM | POA: Diagnosis not present

## 2013-02-05 DIAGNOSIS — H3530 Unspecified macular degeneration: Secondary | ICD-10-CM | POA: Diagnosis not present

## 2013-02-18 DIAGNOSIS — H43399 Other vitreous opacities, unspecified eye: Secondary | ICD-10-CM | POA: Diagnosis not present

## 2013-02-18 DIAGNOSIS — H353190 Nonexudative age-related macular degeneration, unspecified eye, stage unspecified: Secondary | ICD-10-CM | POA: Diagnosis not present

## 2013-02-18 DIAGNOSIS — H353290 Exudative age-related macular degeneration, unspecified eye, stage unspecified: Secondary | ICD-10-CM | POA: Diagnosis not present

## 2013-04-22 DIAGNOSIS — F3289 Other specified depressive episodes: Secondary | ICD-10-CM | POA: Diagnosis not present

## 2013-04-22 DIAGNOSIS — R5381 Other malaise: Secondary | ICD-10-CM | POA: Diagnosis not present

## 2013-04-22 DIAGNOSIS — K219 Gastro-esophageal reflux disease without esophagitis: Secondary | ICD-10-CM | POA: Diagnosis not present

## 2013-04-22 DIAGNOSIS — H353190 Nonexudative age-related macular degeneration, unspecified eye, stage unspecified: Secondary | ICD-10-CM | POA: Diagnosis not present

## 2013-04-22 DIAGNOSIS — G4700 Insomnia, unspecified: Secondary | ICD-10-CM | POA: Diagnosis not present

## 2013-04-22 DIAGNOSIS — F329 Major depressive disorder, single episode, unspecified: Secondary | ICD-10-CM | POA: Diagnosis not present

## 2013-04-22 DIAGNOSIS — H43399 Other vitreous opacities, unspecified eye: Secondary | ICD-10-CM | POA: Diagnosis not present

## 2013-04-22 DIAGNOSIS — H353290 Exudative age-related macular degeneration, unspecified eye, stage unspecified: Secondary | ICD-10-CM | POA: Diagnosis not present

## 2013-04-22 DIAGNOSIS — D649 Anemia, unspecified: Secondary | ICD-10-CM | POA: Diagnosis not present

## 2013-04-22 DIAGNOSIS — I739 Peripheral vascular disease, unspecified: Secondary | ICD-10-CM | POA: Diagnosis not present

## 2013-04-22 DIAGNOSIS — I10 Essential (primary) hypertension: Secondary | ICD-10-CM | POA: Diagnosis not present

## 2013-04-22 DIAGNOSIS — N189 Chronic kidney disease, unspecified: Secondary | ICD-10-CM | POA: Diagnosis not present

## 2013-05-25 DIAGNOSIS — H353190 Nonexudative age-related macular degeneration, unspecified eye, stage unspecified: Secondary | ICD-10-CM | POA: Diagnosis not present

## 2013-05-25 DIAGNOSIS — H209 Unspecified iridocyclitis: Secondary | ICD-10-CM | POA: Diagnosis not present

## 2013-05-25 DIAGNOSIS — H353290 Exudative age-related macular degeneration, unspecified eye, stage unspecified: Secondary | ICD-10-CM | POA: Diagnosis not present

## 2013-05-25 DIAGNOSIS — H3020 Posterior cyclitis, unspecified eye: Secondary | ICD-10-CM | POA: Diagnosis not present

## 2013-05-27 DIAGNOSIS — H353190 Nonexudative age-related macular degeneration, unspecified eye, stage unspecified: Secondary | ICD-10-CM | POA: Diagnosis not present

## 2013-05-27 DIAGNOSIS — H3020 Posterior cyclitis, unspecified eye: Secondary | ICD-10-CM | POA: Diagnosis not present

## 2013-05-27 DIAGNOSIS — H209 Unspecified iridocyclitis: Secondary | ICD-10-CM | POA: Diagnosis not present

## 2013-05-27 DIAGNOSIS — H353290 Exudative age-related macular degeneration, unspecified eye, stage unspecified: Secondary | ICD-10-CM | POA: Diagnosis not present

## 2013-05-28 DIAGNOSIS — J984 Other disorders of lung: Secondary | ICD-10-CM | POA: Diagnosis not present

## 2013-05-31 DIAGNOSIS — H353190 Nonexudative age-related macular degeneration, unspecified eye, stage unspecified: Secondary | ICD-10-CM | POA: Diagnosis not present

## 2013-05-31 DIAGNOSIS — H3020 Posterior cyclitis, unspecified eye: Secondary | ICD-10-CM | POA: Diagnosis not present

## 2013-05-31 DIAGNOSIS — H209 Unspecified iridocyclitis: Secondary | ICD-10-CM | POA: Diagnosis not present

## 2013-05-31 DIAGNOSIS — H353290 Exudative age-related macular degeneration, unspecified eye, stage unspecified: Secondary | ICD-10-CM | POA: Diagnosis not present

## 2013-05-31 DIAGNOSIS — H43399 Other vitreous opacities, unspecified eye: Secondary | ICD-10-CM | POA: Diagnosis not present

## 2013-06-17 DIAGNOSIS — H353290 Exudative age-related macular degeneration, unspecified eye, stage unspecified: Secondary | ICD-10-CM | POA: Diagnosis not present

## 2013-06-17 DIAGNOSIS — H353190 Nonexudative age-related macular degeneration, unspecified eye, stage unspecified: Secondary | ICD-10-CM | POA: Diagnosis not present

## 2013-06-17 DIAGNOSIS — H209 Unspecified iridocyclitis: Secondary | ICD-10-CM | POA: Diagnosis not present

## 2013-06-17 DIAGNOSIS — H3020 Posterior cyclitis, unspecified eye: Secondary | ICD-10-CM | POA: Diagnosis not present

## 2013-06-24 DIAGNOSIS — H5360 Unspecified night blindness: Secondary | ICD-10-CM | POA: Diagnosis not present

## 2013-06-24 DIAGNOSIS — H3020 Posterior cyclitis, unspecified eye: Secondary | ICD-10-CM | POA: Diagnosis not present

## 2013-06-24 DIAGNOSIS — H353290 Exudative age-related macular degeneration, unspecified eye, stage unspecified: Secondary | ICD-10-CM | POA: Diagnosis not present

## 2013-06-24 DIAGNOSIS — H209 Unspecified iridocyclitis: Secondary | ICD-10-CM | POA: Diagnosis not present

## 2013-06-24 DIAGNOSIS — H353190 Nonexudative age-related macular degeneration, unspecified eye, stage unspecified: Secondary | ICD-10-CM | POA: Diagnosis not present

## 2013-06-24 DIAGNOSIS — H43399 Other vitreous opacities, unspecified eye: Secondary | ICD-10-CM | POA: Diagnosis not present

## 2013-07-06 DIAGNOSIS — K219 Gastro-esophageal reflux disease without esophagitis: Secondary | ICD-10-CM | POA: Diagnosis not present

## 2013-07-06 DIAGNOSIS — R630 Anorexia: Secondary | ICD-10-CM | POA: Diagnosis not present

## 2013-07-06 DIAGNOSIS — F3289 Other specified depressive episodes: Secondary | ICD-10-CM | POA: Diagnosis not present

## 2013-07-06 DIAGNOSIS — N189 Chronic kidney disease, unspecified: Secondary | ICD-10-CM | POA: Diagnosis not present

## 2013-07-06 DIAGNOSIS — H3530 Unspecified macular degeneration: Secondary | ICD-10-CM | POA: Diagnosis not present

## 2013-07-06 DIAGNOSIS — F329 Major depressive disorder, single episode, unspecified: Secondary | ICD-10-CM | POA: Diagnosis not present

## 2013-07-06 DIAGNOSIS — D649 Anemia, unspecified: Secondary | ICD-10-CM | POA: Diagnosis not present

## 2013-07-06 DIAGNOSIS — R5381 Other malaise: Secondary | ICD-10-CM | POA: Diagnosis not present

## 2013-07-06 DIAGNOSIS — F411 Generalized anxiety disorder: Secondary | ICD-10-CM | POA: Diagnosis not present

## 2013-07-12 DIAGNOSIS — H43399 Other vitreous opacities, unspecified eye: Secondary | ICD-10-CM | POA: Diagnosis not present

## 2013-07-12 DIAGNOSIS — H353290 Exudative age-related macular degeneration, unspecified eye, stage unspecified: Secondary | ICD-10-CM | POA: Diagnosis not present

## 2013-07-12 DIAGNOSIS — H53139 Sudden visual loss, unspecified eye: Secondary | ICD-10-CM | POA: Diagnosis not present

## 2013-07-12 DIAGNOSIS — H353190 Nonexudative age-related macular degeneration, unspecified eye, stage unspecified: Secondary | ICD-10-CM | POA: Diagnosis not present

## 2013-07-12 DIAGNOSIS — H5340 Unspecified visual field defects: Secondary | ICD-10-CM | POA: Diagnosis not present

## 2013-07-12 DIAGNOSIS — H209 Unspecified iridocyclitis: Secondary | ICD-10-CM | POA: Diagnosis not present

## 2013-07-12 DIAGNOSIS — H3020 Posterior cyclitis, unspecified eye: Secondary | ICD-10-CM | POA: Diagnosis not present

## 2013-08-03 DIAGNOSIS — H43399 Other vitreous opacities, unspecified eye: Secondary | ICD-10-CM | POA: Diagnosis not present

## 2013-08-03 DIAGNOSIS — H209 Unspecified iridocyclitis: Secondary | ICD-10-CM | POA: Diagnosis not present

## 2013-08-03 DIAGNOSIS — H353190 Nonexudative age-related macular degeneration, unspecified eye, stage unspecified: Secondary | ICD-10-CM | POA: Diagnosis not present

## 2013-08-03 DIAGNOSIS — H3020 Posterior cyclitis, unspecified eye: Secondary | ICD-10-CM | POA: Diagnosis not present

## 2013-08-03 DIAGNOSIS — H5340 Unspecified visual field defects: Secondary | ICD-10-CM | POA: Diagnosis not present

## 2013-08-03 DIAGNOSIS — H353290 Exudative age-related macular degeneration, unspecified eye, stage unspecified: Secondary | ICD-10-CM | POA: Diagnosis not present

## 2013-08-03 DIAGNOSIS — H53139 Sudden visual loss, unspecified eye: Secondary | ICD-10-CM | POA: Diagnosis not present

## 2013-09-03 DIAGNOSIS — F411 Generalized anxiety disorder: Secondary | ICD-10-CM | POA: Diagnosis not present

## 2013-09-03 DIAGNOSIS — R630 Anorexia: Secondary | ICD-10-CM | POA: Diagnosis not present

## 2013-09-03 DIAGNOSIS — R5381 Other malaise: Secondary | ICD-10-CM | POA: Diagnosis not present

## 2013-09-03 DIAGNOSIS — D649 Anemia, unspecified: Secondary | ICD-10-CM | POA: Diagnosis not present

## 2013-09-03 DIAGNOSIS — F329 Major depressive disorder, single episode, unspecified: Secondary | ICD-10-CM | POA: Diagnosis not present

## 2013-09-03 DIAGNOSIS — F3289 Other specified depressive episodes: Secondary | ICD-10-CM | POA: Diagnosis not present

## 2013-09-03 DIAGNOSIS — K219 Gastro-esophageal reflux disease without esophagitis: Secondary | ICD-10-CM | POA: Diagnosis not present

## 2013-09-03 DIAGNOSIS — H3530 Unspecified macular degeneration: Secondary | ICD-10-CM | POA: Diagnosis not present

## 2013-09-03 DIAGNOSIS — N189 Chronic kidney disease, unspecified: Secondary | ICD-10-CM | POA: Diagnosis not present

## 2013-09-10 DIAGNOSIS — H43399 Other vitreous opacities, unspecified eye: Secondary | ICD-10-CM | POA: Diagnosis not present

## 2013-09-10 DIAGNOSIS — H353290 Exudative age-related macular degeneration, unspecified eye, stage unspecified: Secondary | ICD-10-CM | POA: Diagnosis not present

## 2013-09-10 DIAGNOSIS — H353190 Nonexudative age-related macular degeneration, unspecified eye, stage unspecified: Secondary | ICD-10-CM | POA: Diagnosis not present

## 2013-09-10 DIAGNOSIS — H53139 Sudden visual loss, unspecified eye: Secondary | ICD-10-CM | POA: Diagnosis not present

## 2013-10-19 DIAGNOSIS — H3340 Traction detachment of retina, unspecified eye: Secondary | ICD-10-CM | POA: Diagnosis not present

## 2013-10-19 DIAGNOSIS — H353290 Exudative age-related macular degeneration, unspecified eye, stage unspecified: Secondary | ICD-10-CM | POA: Diagnosis not present

## 2013-10-19 DIAGNOSIS — H353190 Nonexudative age-related macular degeneration, unspecified eye, stage unspecified: Secondary | ICD-10-CM | POA: Diagnosis not present

## 2013-10-19 DIAGNOSIS — Z961 Presence of intraocular lens: Secondary | ICD-10-CM | POA: Diagnosis not present

## 2013-10-21 ENCOUNTER — Encounter: Payer: Self-pay | Admitting: Family Medicine

## 2013-10-21 ENCOUNTER — Ambulatory Visit (INDEPENDENT_AMBULATORY_CARE_PROVIDER_SITE_OTHER): Payer: Medicare Other | Admitting: Family Medicine

## 2013-10-21 VITALS — BP 140/78 | HR 96 | Temp 98.1°F | Ht 65.0 in | Wt 99.4 lb

## 2013-10-21 DIAGNOSIS — M9112 Juvenile osteochondrosis of head of femur [Legg-Calve-Perthes], left leg: Secondary | ICD-10-CM

## 2013-10-21 DIAGNOSIS — I10 Essential (primary) hypertension: Secondary | ICD-10-CM | POA: Diagnosis not present

## 2013-10-21 DIAGNOSIS — Z23 Encounter for immunization: Secondary | ICD-10-CM | POA: Diagnosis not present

## 2013-10-21 DIAGNOSIS — M109 Gout, unspecified: Secondary | ICD-10-CM

## 2013-10-21 DIAGNOSIS — M10072 Idiopathic gout, left ankle and foot: Secondary | ICD-10-CM

## 2013-10-21 DIAGNOSIS — M9190 Juvenile osteochondrosis of hip and pelvis, unspecified, unspecified leg: Secondary | ICD-10-CM | POA: Diagnosis not present

## 2013-10-21 DIAGNOSIS — M9110 Juvenile osteochondrosis of head of femur [Legg-Calve-Perthes], unspecified leg: Secondary | ICD-10-CM | POA: Insufficient documentation

## 2013-10-21 DIAGNOSIS — K219 Gastro-esophageal reflux disease without esophagitis: Secondary | ICD-10-CM

## 2013-10-21 DIAGNOSIS — R8290 Unspecified abnormal findings in urine: Secondary | ICD-10-CM

## 2013-10-21 DIAGNOSIS — F411 Generalized anxiety disorder: Secondary | ICD-10-CM | POA: Insufficient documentation

## 2013-10-21 DIAGNOSIS — R82998 Other abnormal findings in urine: Secondary | ICD-10-CM | POA: Diagnosis not present

## 2013-10-21 LAB — POCT URINALYSIS DIPSTICK
Bilirubin, UA: NEGATIVE
Blood, UA: NEGATIVE
GLUCOSE UA: NEGATIVE
Ketones, UA: NEGATIVE
Leukocytes, UA: NEGATIVE
Nitrite, UA: NEGATIVE
Protein, UA: 30
Spec Grav, UA: 1.01
Urobilinogen, UA: NEGATIVE
pH, UA: 5

## 2013-10-21 NOTE — Patient Instructions (Signed)

## 2013-10-21 NOTE — Progress Notes (Signed)
Subjective:    Patient ID: Marissa Cruz, female    DOB: 07-Jul-1934, 79 y.o.   MRN: [id number]  HPI Pt here to establish , daughter is present.  No complaints.  Past Medical History  Diagnosis Date  . Arthritis     Thumbs  . Throat cancer     Vocal cord Cancer  . Chicken pox   . Depression   . Diverticulitis   . GERD (gastroesophageal reflux disease)   . Hypertension   . Kidney disease   . Colon polyp   . Stroke     Mini Stroke  . Carotid artery disease    . Current Outpatient Prescriptions  Medication Sig Dispense Refill  . acetaminophen (TYLENOL) 650 MG CR tablet Take 1,300 mg by mouth at bedtime.      Marland Kitchen allopurinol (ZYLOPRIM) 100 MG tablet Take 100 mg by mouth at bedtime.      . ALPRAZolam (XANAX) 0.25 MG tablet Take 0.25 mg by mouth as needed for anxiety.      Marland Kitchen amLODipine (NORVASC) 5 MG tablet Take 5 mg by mouth daily.      Marland Kitchen aspirin 81 MG tablet Take 81 mg by mouth daily.      . beta carotene w/minerals (OCUVITE) tablet Take 1 tablet by mouth every morning.      . Cholecalciferol 4000 UNITS CAPS Take 1 capsule by mouth every morning. Vitamin D 3      . diphenhydrAMINE (SIMPLY SLEEP) 25 MG tablet Take 25 mg by mouth at bedtime as needed for sleep.      Marland Kitchen docusate sodium (COLACE) 100 MG capsule Take 100 mg by mouth every morning.      . estrogens, conjugated, ([name]) 0.625 MG tablet Take 0.625 mg by mouth at bedtime. Take daily for 21 days then do not take for 7 days.      . ferrous sulfate 325 (65 FE) MG tablet Take 325 mg by mouth daily with breakfast.      . LORazepam (ATIVAN) 0.5 MG tablet Take 0.5 mg by mouth as needed for anxiety.      . metoCLOPramide (REGLAN) 10 MG tablet Take 10 mg by mouth as needed for nausea.      . metoprolol succinate (TOPROL-XL) 25 MG 24 hr tablet Take 25 mg by mouth every morning.      . mirtazapine (REMERON SOL-TAB) 15 MG disintegrating tablet Take 15 mg by mouth at bedtime.      . Multiple Vitamins-Minerals (HM MULTIVITAMIN ADULT  GUMMY) CHEW Chew 2 tablets by mouth every morning.      Marland Kitchen omeprazole (PRILOSEC) 20 MG capsule Take 20 mg by mouth 2 (two) times daily before a meal.      . ondansetron (ZOFRAN-ODT) 8 MG disintegrating tablet Take 8 mg by mouth as needed for nausea or vomiting.      Marland Kitchen PARoxetine (PAXIL) 10 MG tablet Take 10 mg by mouth every morning.      . senna (SENOKOT) 8.6 MG TABS tablet Take 2 tablets by mouth daily.      . traZODone (DESYREL) 50 MG tablet Take 50 mg by mouth at bedtime.       No current facility-administered medications for this visit.   Family History  Problem Relation Age of Onset  . Hypertension Father    Allergies  Allergen Reactions  . Morphine And Related    Past Surgical History  Procedure Laterality Date  . Appendectomy    . Tonsillectomy and adenoidectomy    .  Vaginal hysterectomy    . Total hip arthroplasty Left       Review of Systems As above     Objective:   Physical Exam  BP 140/78  Pulse 96  Temp(Src) 98.1 F (36.7 C) (Oral)  Ht 5\' 5"  (1.651 m)  Wt 99 lb 6.4 oz (45.088 kg)  BMI 16.54 kg/m2  SpO2 97% General appearance: alert, cooperative, appears stated age and no distress Throat: lips, mucosa, and tongue normal; teeth and gums normal Neck: no adenopathy, supple, symmetrical, trachea midline and thyroid not enlarged, symmetric, no tenderness/mass/nodules Lungs: clear to auscultation bilaterally Heart: S1, S2 normal Extremities: extremities normal, atraumatic, no cyanosis or edema      Assessment & Plan:  1. Acute idiopathic gout of left foot con't allopurinol  2. Gouty arthritis of toe, left   - Uric acid  3. Essential hypertension Stable, con't meds - Basic metabolic panel - CBC with Differential - Hepatic function panel - Lipid panel - POCT urinalysis dipstick - Uric acid  4. Perthe's disease of hip, left   5. Generalized anxiety disorder con't meds  6. Gastroesophageal reflux disease without esophagitis   7. Abnormal  urine   - Urine Culture

## 2013-10-21 NOTE — Progress Notes (Signed)
Pre visit review using our clinic review tool, if applicable. No additional management support is needed unless otherwise documented below in the visit note. 

## 2013-10-22 ENCOUNTER — Telehealth: Payer: Self-pay | Admitting: Family Medicine

## 2013-10-22 NOTE — Telephone Encounter (Signed)
Relevant patient education mailed to patient.  

## 2013-10-23 LAB — HEPATIC FUNCTION PANEL
ALT: 11 U/L (ref 0–35)
AST: 23 U/L (ref 0–37)
Albumin: 3.7 g/dL (ref 3.5–5.2)
Alkaline Phosphatase: 78 U/L (ref 39–117)
Bilirubin, Direct: 0 mg/dL (ref 0.0–0.3)
TOTAL PROTEIN: 7.1 g/dL (ref 6.0–8.3)
Total Bilirubin: 0.3 mg/dL (ref 0.2–1.2)

## 2013-10-23 LAB — CBC WITH DIFFERENTIAL/PLATELET
Basophils Absolute: 0.2 10*3/uL — ABNORMAL HIGH (ref 0.0–0.1)
Basophils Relative: 1 % (ref 0.0–3.0)
EOS ABS: 0.2 10*3/uL (ref 0.0–0.7)
EOS PCT: 1.1 % (ref 0.0–5.0)
HEMATOCRIT: 30 % — AB (ref 36.0–46.0)
Hemoglobin: 9.2 g/dL — ABNORMAL LOW (ref 12.0–15.0)
LYMPHS ABS: 2.2 10*3/uL (ref 0.7–4.0)
Lymphocytes Relative: 13.8 % (ref 12.0–46.0)
MCHC: 30.7 g/dL (ref 30.0–36.0)
MCV: 92.2 fl (ref 78.0–100.0)
MONO ABS: 0.6 10*3/uL (ref 0.1–1.0)
Monocytes Relative: 3.5 % (ref 3.0–12.0)
Neutro Abs: 13.1 10*3/uL — ABNORMAL HIGH (ref 1.4–7.7)
Neutrophils Relative %: 80.6 % — ABNORMAL HIGH (ref 43.0–77.0)
PLATELETS: 480 10*3/uL — AB (ref 150.0–400.0)
RBC: 3.26 Mil/uL — ABNORMAL LOW (ref 3.87–5.11)
RDW: 16.6 % — ABNORMAL HIGH (ref 11.5–15.5)
WBC: 16.2 10*3/uL — AB (ref 4.0–10.5)

## 2013-10-23 LAB — LIPID PANEL
CHOL/HDL RATIO: 2
Cholesterol: 182 mg/dL (ref 0–200)
HDL: 93.8 mg/dL (ref >39.00)
NonHDL: 88.2
TRIGLYCERIDES: 235 mg/dL — AB (ref 0.0–149.0)
VLDL: 47 mg/dL — ABNORMAL HIGH (ref 0.0–40.0)

## 2013-10-23 LAB — BASIC METABOLIC PANEL
BUN: 35 mg/dL — ABNORMAL HIGH (ref 6–23)
CO2: 27 mEq/L (ref 19–32)
Calcium: 9.2 mg/dL (ref 8.4–10.5)
Chloride: 100 mEq/L (ref 96–112)
Creatinine, Ser: 1.6 mg/dL — ABNORMAL HIGH (ref 0.4–1.2)
GFR: 33.71 mL/min — ABNORMAL LOW (ref >60.00)
Glucose, Bld: 70 mg/dL (ref 70–99)
Potassium: 4.7 mEq/L (ref 3.5–5.1)
SODIUM: 137 meq/L (ref 135–145)

## 2013-10-23 LAB — LDL CHOLESTEROL, DIRECT: Direct LDL: 73.2 mg/dL

## 2013-10-23 LAB — URIC ACID: Uric Acid, Serum: 4 mg/dL (ref 2.4–7.0)

## 2013-10-25 LAB — URINE CULTURE: Colony Count: >=100000

## 2013-10-26 ENCOUNTER — Other Ambulatory Visit: Payer: Self-pay

## 2013-10-26 ENCOUNTER — Encounter: Payer: Self-pay | Admitting: Family Medicine

## 2013-10-26 MED ORDER — CIPROFLOXACIN HCL 500 MG PO TABS: 500.0000 mg | ORAL_TABLET | Freq: Two times a day (BID) | ORAL | Status: DC

## 2013-10-26 NOTE — Progress Notes (Signed)
Quick Note:  Called the patient at [phone] [employer]) and the line was busy ______

## 2013-11-03 DIAGNOSIS — I10 Essential (primary) hypertension: Secondary | ICD-10-CM | POA: Diagnosis not present

## 2013-11-03 DIAGNOSIS — S72009D Fracture of unspecified part of neck of unspecified femur, subsequent encounter for closed fracture with routine healing: Secondary | ICD-10-CM | POA: Diagnosis not present

## 2013-11-03 DIAGNOSIS — H3530 Unspecified macular degeneration: Secondary | ICD-10-CM

## 2013-11-04 ENCOUNTER — Telehealth: Payer: Self-pay

## 2013-11-04 NOTE — Telephone Encounter (Signed)
Forms received via fax on 11/03/2013.  Labeled.  Billing sheet attached and placed in Dr. Nonda Lou red folder for review and signature.

## 2013-11-11 NOTE — Telephone Encounter (Signed)
Forms signed by Dr. Etter Sjogren and faxed back.

## 2013-11-22 ENCOUNTER — Other Ambulatory Visit (INDEPENDENT_AMBULATORY_CARE_PROVIDER_SITE_OTHER): Payer: Medicare Other

## 2013-11-22 ENCOUNTER — Encounter: Payer: Self-pay | Admitting: Family Medicine

## 2013-11-22 DIAGNOSIS — Z0000 Encounter for general adult medical examination without abnormal findings: Secondary | ICD-10-CM | POA: Diagnosis not present

## 2013-11-22 LAB — POC HEMOCCULT BLD/STL (HOME/3-CARD/SCREEN)
Card #2 Fecal Occult Blod, POC: POSITIVE
Card #3 Fecal Occult Blood, POC: NEGATIVE
Fecal Occult Blood, POC: POSITIVE

## 2013-11-22 NOTE — Addendum Note (Signed)
Addended by: Modena Morrow D on: 11/22/2013 05:21 PM   Modules accepted: Orders

## 2013-11-23 ENCOUNTER — Encounter: Payer: Self-pay | Admitting: Family Medicine

## 2013-11-23 ENCOUNTER — Encounter: Payer: Self-pay | Admitting: Gastroenterology

## 2013-11-23 ENCOUNTER — Other Ambulatory Visit: Payer: Self-pay

## 2013-11-23 DIAGNOSIS — K921 Melena: Secondary | ICD-10-CM

## 2013-11-25 ENCOUNTER — Telehealth: Payer: Self-pay | Admitting: Family Medicine

## 2013-11-25 MED ORDER — ALLOPURINOL 100 MG PO TABS: 100.0000 mg | ORAL_TABLET | Freq: Every day | ORAL | Status: DC

## 2013-11-25 NOTE — Telephone Encounter (Signed)
Caller name:  Giglio,Stacey Relation to pt: daughter  Call back number: [phone] [facility] [phone]   Reason for call:  requesting a refill allopurinol (ZYLOPRIM) 100 MG tablet for her gout

## 2013-11-25 NOTE — Telephone Encounter (Signed)
Rx faxed.    KP 

## 2013-11-30 ENCOUNTER — Encounter: Payer: Self-pay | Admitting: Family Medicine

## 2013-11-30 NOTE — Telephone Encounter (Signed)
Please advise if you will authorize this medication or if she should wait until Dr.Lowne comes back.     KP

## 2014-01-04 DIAGNOSIS — H3531 Nonexudative age-related macular degeneration: Secondary | ICD-10-CM | POA: Diagnosis not present

## 2014-01-04 DIAGNOSIS — H3532 Exudative age-related macular degeneration: Secondary | ICD-10-CM | POA: Diagnosis not present

## 2014-01-04 DIAGNOSIS — H43813 Vitreous degeneration, bilateral: Secondary | ICD-10-CM | POA: Diagnosis not present

## 2014-01-07 ENCOUNTER — Encounter: Payer: Self-pay | Admitting: Medical

## 2014-01-07 ENCOUNTER — Ambulatory Visit (INDEPENDENT_AMBULATORY_CARE_PROVIDER_SITE_OTHER): Payer: Medicare Other | Admitting: Medical

## 2014-01-07 VITALS — BP 148/67 | HR 97 | Temp 98.7°F | Ht 65.0 in | Wt 101.8 lb

## 2014-01-07 DIAGNOSIS — M549 Dorsalgia, unspecified: Secondary | ICD-10-CM | POA: Insufficient documentation

## 2014-01-07 DIAGNOSIS — N390 Urinary tract infection, site not specified: Secondary | ICD-10-CM

## 2014-01-07 DIAGNOSIS — C041 Malignant neoplasm of lateral floor of mouth: Secondary | ICD-10-CM | POA: Diagnosis not present

## 2014-01-07 DIAGNOSIS — R319 Hematuria, unspecified: Secondary | ICD-10-CM | POA: Diagnosis not present

## 2014-01-07 DIAGNOSIS — N939 Abnormal uterine and vaginal bleeding, unspecified: Secondary | ICD-10-CM

## 2014-01-07 DIAGNOSIS — J209 Acute bronchitis, unspecified: Secondary | ICD-10-CM | POA: Insufficient documentation

## 2014-01-07 DIAGNOSIS — R82998 Other abnormal findings in urine: Secondary | ICD-10-CM | POA: Insufficient documentation

## 2014-01-07 LAB — POCT URINALYSIS DIPSTICK
Bilirubin, UA: NEGATIVE
GLUCOSE UA: NEGATIVE
Ketones, UA: NEGATIVE
NITRITE UA: NEGATIVE
PROTEIN UA: 1
SPEC GRAV UA: 1.025
UROBILINOGEN UA: 1
pH, UA: 5.5

## 2014-01-07 MED ORDER — CIPROFLOXACIN HCL 250 MG PO TABS: 250.0000 mg | ORAL_TABLET | Freq: Two times a day (BID) | ORAL | Status: DC

## 2014-01-07 NOTE — Assessment & Plan Note (Signed)
With blood in the urine and leukocytes will get urine culture. Pending urine culture if uti symptoms then start ciprofloxin. Made antibiotic available.

## 2014-01-07 NOTE — Progress Notes (Signed)
Pre visit review using our clinic review tool, if applicable. No additional management support is needed unless otherwise documented below in the visit note. 

## 2014-01-07 NOTE — Assessment & Plan Note (Signed)
With some leukocytes in urine. Send culture out. Cipro available if symptoms worsen.

## 2014-01-07 NOTE — Patient Instructions (Addendum)
You have no uti symptoms but your urine has some infection fighting cells. I am sending urine out for culture. I am going to send cipro to your pharmacy. You can start antibiotic if you develop urinary symptoms pending urine culture.  For your blood on toilet paper when you wiped, I checked your vaginal area today and the tissue looks mild swollen and scant blood present. But I could not visualize a source of bleeding. I will refer you to gynecologist for further evaluation.  Folllow up in 7 days or as needed.

## 2014-01-07 NOTE — Progress Notes (Signed)
Subjective:    Patient ID: Marissa Cruz, female    DOB: Mar 09, 1934, 79 y.o.   MRN: [id number]  HPI   Pt states last night she wiped herself and got some bright red blood. She described small bright red spot on toilet paper. This was after wiping vaginal area. Vaginal area is not sore. Pt states the redness was blood not mixed with urine. No fever, no chills, no nausea or vomiting. No new back pain. No suprapubic pain. Pt had complete hysterectomy.(40 yrs ago)  Pt just moved here.   Past Medical History  Diagnosis Date  . Arthritis     Thumbs  . Throat cancer     Vocal cord Cancer  . Chicken pox   . Depression   . Diverticulitis   . GERD (gastroesophageal reflux disease)   . Hypertension   . Kidney disease   . Colon polyp   . Stroke     Mini Stroke  . Carotid artery disease     History   Social History  . Marital Status: Widowed    Spouse Name: N/A    Number of Children: N/A  . Years of Education: N/A   Occupational History  . Not on file.   Social History Main Topics  . Smoking status: Current Every Day Smoker  . Smokeless tobacco: Never Used  . Alcohol Use: Yes  . Drug Use: No  . Sexual Activity: Not on file   Other Topics Concern  . Not on file   Social History Narrative    Past Surgical History  Procedure Laterality Date  . Appendectomy    . Tonsillectomy and adenoidectomy    . Vaginal hysterectomy    . Total hip arthroplasty Left     Family History  Problem Relation Age of Onset  . Hypertension Father     Allergies  Allergen Reactions  . Morphine And Related     Current Outpatient Prescriptions on File Prior to Visit  Medication Sig Dispense Refill  . acetaminophen (TYLENOL) 650 MG CR tablet Take 1,300 mg by mouth at bedtime.    Marland Kitchen allopurinol (ZYLOPRIM) 100 MG tablet Take 1 tablet (100 mg total) by mouth at bedtime. 90 tablet 1  . ALPRAZolam (XANAX) 0.25 MG tablet Take 0.25 mg by mouth as needed for anxiety.    Marland Kitchen amLODipine (NORVASC)  5 MG tablet Take 5 mg by mouth daily.    Marland Kitchen aspirin 81 MG tablet Take 81 mg by mouth daily.    . beta carotene w/minerals (OCUVITE) tablet Take 1 tablet by mouth every morning.    . Cholecalciferol 4000 UNITS CAPS Take 1 capsule by mouth every morning. Vitamin D 3    . ciprofloxacin (CIPRO) 500 MG tablet Take 1 tablet (500 mg total) by mouth 2 (two) times daily. 10 tablet 0  . diphenhydrAMINE (SIMPLY SLEEP) 25 MG tablet Take 25 mg by mouth at bedtime as needed for sleep.    Marland Kitchen docusate sodium (COLACE) 100 MG capsule Take 100 mg by mouth every morning.    . estrogens, conjugated, ([name]) 0.625 MG tablet Take 0.625 mg by mouth at bedtime. Take daily for 21 days then do not take for 7 days.    . ferrous sulfate 325 (65 FE) MG tablet Take 325 mg by mouth daily with breakfast.    . LORazepam (ATIVAN) 0.5 MG tablet Take 0.5 mg by mouth as needed for anxiety.    . metoCLOPramide (REGLAN) 10 MG tablet Take 10 mg by mouth  as needed for nausea.    . metoprolol succinate (TOPROL-XL) 25 MG 24 hr tablet Take 25 mg by mouth every morning.    . mirtazapine (REMERON SOL-TAB) 15 MG disintegrating tablet Take 15 mg by mouth at bedtime.    . Multiple Vitamins-Minerals (HM MULTIVITAMIN ADULT GUMMY) CHEW Chew 2 tablets by mouth every morning.    Marland Kitchen omeprazole (PRILOSEC) 20 MG capsule Take 20 mg by mouth 2 (two) times daily before a meal.    . ondansetron (ZOFRAN-ODT) 8 MG disintegrating tablet Take 8 mg by mouth as needed for nausea or vomiting.    Marland Kitchen PARoxetine (PAXIL) 10 MG tablet Take 10 mg by mouth every morning.    . senna (SENOKOT) 8.6 MG TABS tablet Take 2 tablets by mouth daily.    . traZODone (DESYREL) 50 MG tablet Take 50 mg by mouth at bedtime.     No current facility-administered medications on file prior to visit.    BP 148/67 mmHg  Pulse 97  Temp(Src) 98.7 F (37.1 C) (Oral)  Ht 5\' 5"  (1.651 m)  Wt 101 lb 12.8 oz (46.176 kg)  BMI 16.94 kg/m2  SpO2 94%     Review of Systems    Constitutional: Negative for fever, chills and fatigue.  Respiratory: Negative for cough, choking, shortness of breath and wheezing.   Cardiovascular: Negative for chest pain and palpitations.  Gastrointestinal: Negative.   Genitourinary: Negative.  Negative for dysuria, urgency, frequency, hematuria, flank pain, decreased urine volume, vaginal discharge, enuresis, difficulty urinating, vaginal pain and pelvic pain.       Slight bright red blood on wiping vaginal area.  Musculoskeletal: Negative.   Skin: Negative.   Neurological: Negative.   Hematological: Negative for adenopathy. Does not bruise/bleed easily.       Objective:   Physical Exam  Constitutional: She is oriented to person, place, and time. She appears well-developed and well-nourished.  HENT:  Head: Normocephalic and atraumatic.  Eyes: Conjunctivae and EOM are normal. Pupils are equal, round, and reactive to light.  Neck: Normal range of motion. Neck supple.  Cardiovascular: Normal rate, regular rhythm and normal heart sounds.  Exam reveals no gallop and no friction rub.   No murmur heard. Pulmonary/Chest: Effort normal and breath sounds normal. No respiratory distress. She has no wheezes. She has no rales. She exhibits no tenderness.  Abdominal: Soft. Bowel sounds are normal. She exhibits no distension and no mass. There is no tenderness. There is no rebound and no guarding.  Suprapubic area not tender.  Genitourinary:  Outside inpection of vaginal area shows. Slight reddish raw appearance. One small spec of blood.I gid not see source of the blood.  Neurological: She is alert and oriented to person, place, and time. No cranial nerve deficit.  Psychiatric: She has a normal mood and affect. Her behavior is normal. Judgment and thought content normal.           Assessment & Plan:

## 2014-01-09 LAB — URINE CULTURE

## 2014-01-10 ENCOUNTER — Telehealth: Payer: Self-pay | Admitting: Medical

## 2014-01-10 ENCOUNTER — Telehealth: Payer: Self-pay | Admitting: Family Medicine

## 2014-01-10 MED ORDER — CIPROFLOXACIN HCL 500 MG PO TABS: 500.0000 mg | ORAL_TABLET | Freq: Two times a day (BID) | ORAL | Status: DC

## 2014-01-10 NOTE — Telephone Encounter (Signed)
emmi emailed °

## 2014-01-10 NOTE — Telephone Encounter (Signed)
Patient advised ABO sent to pharmacy.

## 2014-01-10 NOTE — Telephone Encounter (Signed)
Will give more days of cipro due to + klebsiella.

## 2014-01-11 ENCOUNTER — Other Ambulatory Visit: Payer: Self-pay | Admitting: Family Medicine

## 2014-01-11 ENCOUNTER — Telehealth: Payer: Self-pay | Admitting: Medical

## 2014-01-11 NOTE — Telephone Encounter (Signed)
Pt calling want more antibiotics. I gave cipro 250 mg bid x 3 days. Then when culture came back I gave addtion cipro 500 mg bid x 4 more days. Did she pick up that script. Total 7 days. I don't thinks she needs any more unless worse symptoms and in that case needs to be rechecked?

## 2014-01-13 ENCOUNTER — Telehealth: Payer: Self-pay | Admitting: Medical

## 2014-01-13 MED ORDER — CIPROFLOXACIN HCL 500 MG PO TABS: 500.0000 mg | ORAL_TABLET | Freq: Two times a day (BID) | ORAL | Status: DC

## 2014-01-13 NOTE — Telephone Encounter (Signed)
Unclear why pt did not take the original low dose cipro I rx'd. But I want her to be on cipro 500 mg bid x full 7 days. I called in 500 mg bid x 4 days the other day. So call in another 500 mg bid x 3 days.

## 2014-01-21 DIAGNOSIS — C049 Malignant neoplasm of floor of mouth, unspecified: Secondary | ICD-10-CM | POA: Diagnosis not present

## 2014-01-24 ENCOUNTER — Ambulatory Visit (INDEPENDENT_AMBULATORY_CARE_PROVIDER_SITE_OTHER): Payer: Medicare Other | Admitting: Gastroenterology

## 2014-01-24 ENCOUNTER — Encounter: Payer: Self-pay | Admitting: Gastroenterology

## 2014-01-24 VITALS — BP 168/84 | HR 100 | Ht 65.0 in | Wt 101.8 lb

## 2014-01-24 DIAGNOSIS — R195 Other fecal abnormalities: Secondary | ICD-10-CM | POA: Diagnosis not present

## 2014-01-24 DIAGNOSIS — D509 Iron deficiency anemia, unspecified: Secondary | ICD-10-CM | POA: Diagnosis not present

## 2014-01-24 MED ORDER — PEG-KCL-NACL-NASULF-NA ASC-C 100 G PO SOLR: 1.0000 | Freq: Once | ORAL | Status: DC

## 2014-01-24 NOTE — Patient Instructions (Addendum)
Your procedure has been scheduled at [facility] on 01/31/2014 Separate instructions have been given STOP IRON 5 days before procedure

## 2014-01-24 NOTE — Progress Notes (Signed)
    History of Present Illness: This is a 79 year old female accompanied by her daughter. She was recently found to have occult blood in her stool. She has an anemia which has been treated with iron replacement. She states she has been treated with iron replacement several times in the past. She states she had a colonoscopy performed in [place] about 6 or 7 years ago and she reports that it was normal. Denies weight loss, abdominal pain, constipation, diarrhea, change in stool caliber, melena, hematochezia, nausea, vomiting, dysphagia, reflux symptoms, chest pain.  Current Medications, Allergies, Past Medical History, Past Surgical History, Family History and Social History were reviewed in [employer] record.  Physical Exam: General: Well developed , well nourished, frail, elderly, no acute distress Head: Normocephalic and atraumatic Eyes:  sclerae anicteric, EOMI Ears: Normal auditory acuity Mouth: No deformity or lesions Lungs: Clear throughout to auscultation Heart: Regular rate and rhythm; no murmurs, rubs or bruits Abdomen: Soft, non tender and non distended. No masses, hepatosplenomegaly or hernias noted. Normal Bowel sounds Rectal: Deferred to colonoscopy Musculoskeletal: Symmetrical with no gross deformities  Pulses:  Normal pulses noted Extremities: No clubbing, cyanosis, edema or deformities noted Neurological: Alert oriented x 4, grossly nonfocal Psychological:  Alert and cooperative. Normal mood and affect  Assessment and Recommendations:  1. Hemoccult positive stool and anemia, possibly iron deficiency. Schedule colonoscopy. The risks, benefits, and alternatives to colonoscopy with possible biopsy and possible polypectomy were discussed with the patient and they consent to proceed.

## 2014-01-26 DIAGNOSIS — D490 Neoplasm of unspecified behavior of digestive system: Secondary | ICD-10-CM | POA: Diagnosis not present

## 2014-01-31 ENCOUNTER — Encounter (HOSPITAL_COMMUNITY)
Admission: RE | Disposition: A | Discharge status: Home / Self Care | Payer: Self-pay | Source: Ambulatory Visit | Attending: Gastroenterology

## 2014-01-31 ENCOUNTER — Encounter (HOSPITAL_COMMUNITY): Payer: Self-pay | Admitting: *Deleted

## 2014-01-31 ENCOUNTER — Ambulatory Visit (HOSPITAL_COMMUNITY)
Admission: RE | Admit: 2014-01-31 | Discharge: 2014-01-31 | Disposition: A | Discharge status: Home / Self Care | Payer: Medicare Other | Source: Ambulatory Visit | Attending: Gastroenterology | Admitting: Gastroenterology

## 2014-01-31 DIAGNOSIS — D123 Benign neoplasm of transverse colon: Secondary | ICD-10-CM

## 2014-01-31 DIAGNOSIS — D509 Iron deficiency anemia, unspecified: Secondary | ICD-10-CM

## 2014-01-31 DIAGNOSIS — Z1211 Encounter for screening for malignant neoplasm of colon: Secondary | ICD-10-CM | POA: Insufficient documentation

## 2014-01-31 DIAGNOSIS — K648 Other hemorrhoids: Secondary | ICD-10-CM | POA: Insufficient documentation

## 2014-01-31 DIAGNOSIS — D500 Iron deficiency anemia secondary to blood loss (chronic): Secondary | ICD-10-CM | POA: Diagnosis not present

## 2014-01-31 DIAGNOSIS — D649 Anemia, unspecified: Secondary | ICD-10-CM | POA: Insufficient documentation

## 2014-01-31 DIAGNOSIS — D125 Benign neoplasm of sigmoid colon: Secondary | ICD-10-CM | POA: Diagnosis not present

## 2014-01-31 DIAGNOSIS — R195 Other fecal abnormalities: Secondary | ICD-10-CM

## 2014-01-31 HISTORY — PX: COLONOSCOPY: SHX5424

## 2014-01-31 SURGERY — COLONOSCOPY
Anesthesia: Moderate Sedation

## 2014-01-31 MED ORDER — SODIUM CHLORIDE 0.9 % IV SOLN
INTRAVENOUS | Status: DC
Start: 2014-01-31 — End: 2014-01-31
  Administered 2014-01-31: 500 mL via INTRAVENOUS

## 2014-01-31 MED ORDER — MIDAZOLAM HCL 10 MG/2ML IJ SOLN
INTRAMUSCULAR | Status: AC
  Filled 2014-01-31: qty 2

## 2014-01-31 MED ORDER — FENTANYL CITRATE 0.05 MG/ML IJ SOLN
INTRAMUSCULAR | Status: AC
  Filled 2014-01-31: qty 2

## 2014-01-31 MED ORDER — FENTANYL CITRATE 0.05 MG/ML IJ SOLN
INTRAMUSCULAR | Status: DC | PRN
  Administered 2014-01-31 (×3): 25 ug via INTRAVENOUS

## 2014-01-31 MED ORDER — MIDAZOLAM HCL 5 MG/5ML IJ SOLN
INTRAMUSCULAR | Status: DC | PRN
  Administered 2014-01-31 (×3): 2 mg via INTRAVENOUS

## 2014-01-31 NOTE — Discharge Instructions (Signed)
Gastrointestinal Endoscopy, Care After °Refer to this sheet in the next few weeks. These instructions provide you with information on caring for yourself after your procedure. Your caregiver may also give you more specific instructions. Your treatment has been planned according to current medical practices, but problems sometimes occur. Call your caregiver if you have any problems or questions after your procedure. °HOME CARE INSTRUCTIONS °· If you were given medicine to help you relax (sedative), do not drive, operate machinery, or sign important documents for 24 hours. °· Avoid alcohol and hot or warm beverages for the first 24 hours after the procedure. °· Only take over-the-counter or prescription medicines for pain, discomfort, or fever as directed by your caregiver. You may resume taking your normal medicines unless your caregiver tells you otherwise. Ask your caregiver when you may resume taking medicines that may cause bleeding, such as aspirin, clopidogrel, or warfarin. °· You may return to your normal diet and activities on the day after your procedure, or as directed by your caregiver. Walking may help to reduce any bloated feeling in your abdomen. °· Drink enough fluids to keep your urine clear or pale yellow. °· You may gargle with salt water if you have a sore throat. °SEEK IMMEDIATE MEDICAL CARE IF: °· You have severe nausea or vomiting. °· You have severe abdominal pain, abdominal cramps that last longer than 6 hours, or abdominal swelling (distention). °· You have severe shoulder or back pain. °· You have trouble swallowing. °· You have shortness of breath, your breathing is shallow, or you are breathing faster than normal. °· You have a fever or a rapid heartbeat. °· You vomit blood or material that looks like coffee grounds. °· You have bloody, black, or tarry stools. °MAKE SURE YOU: °· Understand these instructions. °· Will watch your condition. °· Will get help right away if you are not doing  well or get worse. °Document Released: 10/03/2003 Document Revised: 07/05/2013 Document Reviewed: 05/21/2011 °ExitCare® Patient Information ©2015 [employer]. This information is not intended to replace advice given to you by your health care provider. Make sure you discuss any questions you have with your health care provider. ° °

## 2014-01-31 NOTE — H&P (View-Only) (Signed)
    History of Present Illness: This is a 79 year old female accompanied by her daughter. She was recently found to have occult blood in her stool. She has an anemia which has been treated with iron replacement. She states she has been treated with iron replacement several times in the past. She states she had a colonoscopy performed in [place] about 6 or 7 years ago and she reports that it was normal. Denies weight loss, abdominal pain, constipation, diarrhea, change in stool caliber, melena, hematochezia, nausea, vomiting, dysphagia, reflux symptoms, chest pain.  Current Medications, Allergies, Past Medical History, Past Surgical History, Family History and Social History were reviewed in [employer] record.  Physical Exam: General: Well developed , well nourished, frail, elderly, no acute distress Head: Normocephalic and atraumatic Eyes:  sclerae anicteric, EOMI Ears: Normal auditory acuity Mouth: No deformity or lesions Lungs: Clear throughout to auscultation Heart: Regular rate and rhythm; no murmurs, rubs or bruits Abdomen: Soft, non tender and non distended. No masses, hepatosplenomegaly or hernias noted. Normal Bowel sounds Rectal: Deferred to colonoscopy Musculoskeletal: Symmetrical with no gross deformities  Pulses:  Normal pulses noted Extremities: No clubbing, cyanosis, edema or deformities noted Neurological: Alert oriented x 4, grossly nonfocal Psychological:  Alert and cooperative. Normal mood and affect  Assessment and Recommendations:  1. Hemoccult positive stool and anemia, possibly iron deficiency. Schedule colonoscopy. The risks, benefits, and alternatives to colonoscopy with possible biopsy and possible polypectomy were discussed with the patient and they consent to proceed.

## 2014-01-31 NOTE — Interval H&P Note (Signed)
History and Physical Interval Note:  01/31/2014 8:02 AM  Marissa Cruz  has presented today for surgery, with the diagnosis of blood in stools   anemia  The various methods of treatment have been discussed with the patient and family. After consideration of risks, benefits and other options for treatment, the patient has consented to  Procedure(s): COLONOSCOPY (N/A) as a surgical intervention .  The patient's history has been reviewed, patient examined, no change in status, stable for surgery.  I have reviewed the patient's chart and labs.  Questions were answered to the patient's satisfaction.     Pricilla Riffle. Fuller Plan MD

## 2014-01-31 NOTE — Op Note (Signed)
[facility] [place], [id number]   COLONOSCOPY PROCEDURE REPORT  PATIENT: Marissa Cruz, Marissa Cruz  MR#: [id number] BIRTHDATE: June 30, 1934 , 79  yrs. old GENDER: female ENDOSCOPIST: Ladene Artist, MD, [facility] REFERRED HM:CNOBSJ Lowne, DO PROCEDURE DATE:  01/31/2014 PROCEDURE:   Colonoscopy with snare polypectomy First Screening Colonoscopy - Avg.  risk and is 50 yrs.  old or older - No.  Prior Negative Screening - Now for repeat screening. N/A  History of Adenoma - Now for follow-up colonoscopy & has been > or = to 3 yrs.  N/A  Polyps Removed Today? Yes. ASA CLASS:   Class III INDICATIONS:heme-positive stool and anemia, non-specific. MEDICATIONS: Fentanyl 75 mcg IV and Versed 6 mg IV DESCRIPTION OF PROCEDURE:   After the risks benefits and alternatives of the procedure were thoroughly explained, informed consent was obtained.  The digital rectal exam revealed no abnormalities of the rectum.   The Pentax Ped Colon [id number] endoscope was introduced through the anus and advanced to the cecum, which was identified by both the appendix and ileocecal valve. No adverse events experienced.   Limited by a tortuous colon.   The quality of the prep was good, using MoviPrep  The instrument was then slowly withdrawn as the colon was fully examined.  COLON FINDINGS: Multiple non-bleeding arteriovenous malformations were found in the ascending colon and at the cecum.   Two sessile polyps measuring 6 mm in size were found in the sigmoid colon and transverse colon.  A polypectomy was performed with a cold snare. The resection was complete, the polyp tissue was completely retrieved and sent to histology.   The examination was otherwise normal.  Retroflexed views revealed internal Grade I hemorrhoids. The time to cecum=6 minutes 00 seconds.  Withdrawal time=11 minutes 00 seconds.  The scope was withdrawn and the procedure completed. COMPLICATIONS: There were no immediate  complications.  ENDOSCOPIC IMPRESSION: 1.   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   Two sessile polyps in the sigmoid colon and transverse colon; polypectomy performed with a cold snare 3.   Grade l internal hemorrhoids  RECOMMENDATIONS: 1.  Await pathology results 2.  Given your age, you will not need another colonoscopy for colon cancer screening or polyp surveillance.  These types of tests usually stop around the age 54. 3.  Chronic blood loss from AVMs. Continue Fe replacement. Anticipate ongoing GI blood loss from AVMs.  eSigned:  Ladene Artist, MD, [facility] 01/31/2014 9:07 AM

## 2014-02-01 ENCOUNTER — Encounter: Payer: Self-pay | Admitting: Gastroenterology

## 2014-02-01 ENCOUNTER — Encounter (HOSPITAL_COMMUNITY): Payer: Self-pay | Admitting: Gastroenterology

## 2014-03-04 DIAGNOSIS — C069 Malignant neoplasm of mouth, unspecified: Secondary | ICD-10-CM

## 2014-03-04 HISTORY — PX: MOUTH FLOOR MASS EXCISION: SHX2048

## 2014-03-04 HISTORY — DX: Malignant neoplasm of mouth, unspecified: C06.9

## 2014-04-01 DIAGNOSIS — I10 Essential (primary) hypertension: Secondary | ICD-10-CM | POA: Diagnosis not present

## 2014-04-01 DIAGNOSIS — J309 Allergic rhinitis, unspecified: Secondary | ICD-10-CM | POA: Diagnosis not present

## 2014-04-01 DIAGNOSIS — Z885 Allergy status to narcotic agent status: Secondary | ICD-10-CM | POA: Diagnosis not present

## 2014-04-01 DIAGNOSIS — Z79899 Other long term (current) drug therapy: Secondary | ICD-10-CM | POA: Diagnosis not present

## 2014-04-01 DIAGNOSIS — K219 Gastro-esophageal reflux disease without esophagitis: Secondary | ICD-10-CM | POA: Diagnosis not present

## 2014-04-01 DIAGNOSIS — Z8673 Personal history of transient ischemic attack (TIA), and cerebral infarction without residual deficits: Secondary | ICD-10-CM | POA: Diagnosis not present

## 2014-04-01 DIAGNOSIS — F17210 Nicotine dependence, cigarettes, uncomplicated: Secondary | ICD-10-CM | POA: Diagnosis not present

## 2014-04-01 DIAGNOSIS — Z7982 Long term (current) use of aspirin: Secondary | ICD-10-CM | POA: Diagnosis not present

## 2014-04-01 DIAGNOSIS — D490 Neoplasm of unspecified behavior of digestive system: Secondary | ICD-10-CM | POA: Diagnosis not present

## 2014-04-01 DIAGNOSIS — F419 Anxiety disorder, unspecified: Secondary | ICD-10-CM | POA: Diagnosis not present

## 2014-04-02 DIAGNOSIS — Z01810 Encounter for preprocedural cardiovascular examination: Secondary | ICD-10-CM | POA: Diagnosis not present

## 2014-04-04 ENCOUNTER — Encounter: Payer: Self-pay | Admitting: Family Medicine

## 2014-04-04 MED ORDER — OMEPRAZOLE 20 MG PO CPDR: 20.0000 mg | DELAYED_RELEASE_CAPSULE | Freq: Two times a day (BID) | ORAL | Status: DC

## 2014-04-05 DIAGNOSIS — Z90710 Acquired absence of both cervix and uterus: Secondary | ICD-10-CM | POA: Diagnosis not present

## 2014-04-05 DIAGNOSIS — Z7982 Long term (current) use of aspirin: Secondary | ICD-10-CM | POA: Diagnosis not present

## 2014-04-05 DIAGNOSIS — D649 Anemia, unspecified: Secondary | ICD-10-CM | POA: Diagnosis not present

## 2014-04-05 DIAGNOSIS — Z885 Allergy status to narcotic agent status: Secondary | ICD-10-CM | POA: Diagnosis not present

## 2014-04-05 DIAGNOSIS — I10 Essential (primary) hypertension: Secondary | ICD-10-CM | POA: Diagnosis not present

## 2014-04-05 DIAGNOSIS — F419 Anxiety disorder, unspecified: Secondary | ICD-10-CM | POA: Diagnosis not present

## 2014-04-05 DIAGNOSIS — Z8673 Personal history of transient ischemic attack (TIA), and cerebral infarction without residual deficits: Secondary | ICD-10-CM | POA: Diagnosis not present

## 2014-04-05 DIAGNOSIS — F17210 Nicotine dependence, cigarettes, uncomplicated: Secondary | ICD-10-CM | POA: Diagnosis not present

## 2014-04-05 DIAGNOSIS — K219 Gastro-esophageal reflux disease without esophagitis: Secondary | ICD-10-CM | POA: Diagnosis not present

## 2014-04-05 DIAGNOSIS — Z9049 Acquired absence of other specified parts of digestive tract: Secondary | ICD-10-CM | POA: Diagnosis not present

## 2014-04-05 DIAGNOSIS — Z96642 Presence of left artificial hip joint: Secondary | ICD-10-CM | POA: Diagnosis not present

## 2014-04-05 DIAGNOSIS — Z9889 Other specified postprocedural states: Secondary | ICD-10-CM | POA: Diagnosis not present

## 2014-04-05 DIAGNOSIS — Z923 Personal history of irradiation: Secondary | ICD-10-CM | POA: Diagnosis not present

## 2014-04-05 DIAGNOSIS — D0006 Carcinoma in situ of floor of mouth: Secondary | ICD-10-CM | POA: Diagnosis not present

## 2014-04-18 ENCOUNTER — Other Ambulatory Visit: Payer: Self-pay

## 2014-04-18 NOTE — Telephone Encounter (Signed)
[name] requested  Please advise due to patient age.   Marissa Cruz

## 2014-04-20 ENCOUNTER — Telehealth: Payer: Self-pay | Admitting: *Deleted

## 2014-04-20 DIAGNOSIS — D0006 Carcinoma in situ of floor of mouth: Secondary | ICD-10-CM | POA: Diagnosis not present

## 2014-04-20 DIAGNOSIS — Z483 Aftercare following surgery for neoplasm: Secondary | ICD-10-CM | POA: Diagnosis not present

## 2014-04-20 NOTE — Telephone Encounter (Signed)
Received home health certification and plan of care via fax from [facility]. Forwarded to Dr. Etter Sjogren. JG//CMA

## 2014-04-21 DIAGNOSIS — M9110 Juvenile osteochondrosis of head of femur [Legg-Calve-Perthes], unspecified leg: Secondary | ICD-10-CM | POA: Diagnosis not present

## 2014-04-21 DIAGNOSIS — H3530 Unspecified macular degeneration: Secondary | ICD-10-CM | POA: Diagnosis not present

## 2014-04-21 DIAGNOSIS — D123 Benign neoplasm of transverse colon: Secondary | ICD-10-CM | POA: Diagnosis not present

## 2014-04-21 DIAGNOSIS — I10 Essential (primary) hypertension: Secondary | ICD-10-CM | POA: Diagnosis not present

## 2014-04-26 DIAGNOSIS — H3532 Exudative age-related macular degeneration: Secondary | ICD-10-CM | POA: Diagnosis not present

## 2014-04-26 DIAGNOSIS — H3341 Traction detachment of retina, right eye: Secondary | ICD-10-CM | POA: Diagnosis not present

## 2014-04-26 DIAGNOSIS — H35372 Puckering of macula, left eye: Secondary | ICD-10-CM | POA: Diagnosis not present

## 2014-04-26 DIAGNOSIS — H3531 Nonexudative age-related macular degeneration: Secondary | ICD-10-CM | POA: Diagnosis not present

## 2014-05-01 ENCOUNTER — Other Ambulatory Visit: Payer: Self-pay | Admitting: Family Medicine

## 2014-05-02 MED ORDER — ESTROGENS CONJUGATED 0.625 MG PO TABS: 0.6250 mg | ORAL_TABLET | Freq: Every day | ORAL | Status: DC

## 2014-05-16 ENCOUNTER — Other Ambulatory Visit: Payer: Self-pay | Admitting: Family Medicine

## 2014-05-16 ENCOUNTER — Telehealth: Payer: Self-pay

## 2014-05-16 NOTE — Telephone Encounter (Signed)
See speciality notes 

## 2014-05-17 ENCOUNTER — Encounter: Payer: Self-pay | Admitting: Family Medicine

## 2014-05-17 ENCOUNTER — Ambulatory Visit (INDEPENDENT_AMBULATORY_CARE_PROVIDER_SITE_OTHER): Payer: Medicare Other | Admitting: Family Medicine

## 2014-05-17 VITALS — BP 142/64 | HR 94 | Temp 98.5°F | Ht 65.0 in | Wt 105.6 lb

## 2014-05-17 DIAGNOSIS — F17200 Nicotine dependence, unspecified, uncomplicated: Secondary | ICD-10-CM

## 2014-05-17 DIAGNOSIS — Z720 Tobacco use: Secondary | ICD-10-CM | POA: Diagnosis not present

## 2014-05-17 DIAGNOSIS — I10 Essential (primary) hypertension: Secondary | ICD-10-CM

## 2014-05-17 DIAGNOSIS — Z0000 Encounter for general adult medical examination without abnormal findings: Secondary | ICD-10-CM

## 2014-05-17 DIAGNOSIS — E2839 Other primary ovarian failure: Secondary | ICD-10-CM | POA: Diagnosis not present

## 2014-05-17 MED ORDER — NICOTINE 10 MG IN INHA: 1.0000 | RESPIRATORY_TRACT | Status: DC | PRN

## 2014-05-17 NOTE — Progress Notes (Signed)
Subjective:   Marissa Cruz is a 80 y.o. female who presents for Medicare Annual (Subsequent) preventive examination.  Review of Systems:   Review of Systems  Constitutional: Negative for activity change, appetite change and fatigue.  HENT: Negative for hearing loss, congestion, tinnitus and ear discharge.   Eyes: Negative for visual disturbance (see optho q1y -- vision corrected to 20/20 with glasses).  Respiratory: Negative for cough, chest tightness and shortness of breath.   Cardiovascular: Negative for chest pain, palpitations and leg swelling.  Gastrointestinal: Negative for abdominal pain, diarrhea, constipation and abdominal distention.  Genitourinary: Negative for urgency, frequency, decreased urine volume and difficulty urinating.  Musculoskeletal: Negative for back pain, arthralgias and gait problem.  Skin: Negative for color change, pallor and rash.  Neurological: Negative for dizziness, light-headedness, numbness and headaches.  Hematological: Negative for adenopathy. Does not bruise/bleed easily.  Psychiatric/Behavioral: Negative for suicidal ideas, confusion, sleep disturbance, self-injury, dysphoric mood, decreased concentration and agitation.  Pt is able to read and write and can do all ADLs No risk for falling No abuse/ violence in home    [facility]th-- [facility]-- [facility]-- [facility]          Objective:     Vitals: BP 142/64 mmHg  Pulse 94  Temp(Src) 98.5 F (36.9 C) (Oral)  Ht 5\' 5"  (1.651 m)  Wt 105 lb 9.6 oz (47.9 kg)  BMI 17.57 kg/m2  SpO2 97% BP 142/64 mmHg  Pulse 94  Temp(Src) 98.5 F (36.9 C) (Oral)  Ht 5\' 5"  (1.651 m)  Wt 105 lb 9.6 oz (47.9 kg)  BMI 17.57 kg/m2  SpO2 97% General appearance: alert, cooperative, appears stated age and no distress Head: Normocephalic, without obvious abnormality, atraumatic Eyes: conjunctivae/corneas clear. PERRL, EOM's intact. Fundi benign. Ears: normal TM's and external ear canals both  ears Nose: Nares normal. Septum midline. Mucosa normal. No drainage or sinus tenderness. Throat: lips, mucosa, and tongue normal; teeth and gums normal Neck: no adenopathy, no carotid bruit, no JVD, supple, symmetrical, trachea midline and thyroid not enlarged, symmetric, no tenderness/mass/nodules Back: symmetric, no curvature. ROM normal. No CVA tenderness. Lungs: clear to auscultation bilaterally Breasts: normal appearance, no masses or tenderness Heart: regular rate and rhythm, S1, S2 normal, no murmur, click, rub or gallop Abdomen: soft, non-tender; bowel sounds normal; no masses,  no organomegaly Pelvic: not indicated; post-menopausal, no abnormal Pap smears in past Extremities: extremities normal, atraumatic, no cyanosis or edema Pulses: 2+ and symmetric Skin: Skin color, texture, turgor normal. No rashes or lesions Lymph nodes: Cervical, supraclavicular, and axillary nodes normal. Neurologic: Alert and oriented X 3, normal strength and tone. Normal symmetric reflexes. Normal coordination and gait Psych-- no depression , no anxiety Tobacco History  Smoking status  . Current Every Day Smoker -- 0.50 packs/day  . Types: Cigarettes  Smokeless tobacco  . Never Used     Ready to quit: Yes Counseling given: Not Answered   Past Medical History  Diagnosis Date  . Arthritis     Thumbs  . Throat cancer     Vocal cord Cancer  . Chicken pox   . Depression   . Diverticulitis   . GERD (gastroesophageal reflux disease)   . Hypertension   . Kidney disease   . Colon polyp   . Stroke     Mini Stroke  . Carotid artery disease   . Oral-mouth cancer    Past Surgical History  Procedure Laterality Date  . Appendectomy    . Tonsillectomy and  adenoidectomy    . Vaginal hysterectomy    . Total hip arthroplasty Left   . Carotid endarterectomy    . Colonoscopy N/A 01/31/2014    Procedure: COLONOSCOPY;  Surgeon: Ladene Artist, MD;  Location: [facility];  Service: Endoscopy;   Laterality: N/A;  . Mouth cancer     Family History  Problem Relation Age of Onset  . Hypertension Father    History  Sexual Activity  . Sexual Activity: Not on file    Outpatient Encounter Prescriptions as of 05/17/2014  Medication Sig  . acetaminophen (TYLENOL) 650 MG CR tablet Take 1,300 mg by mouth at bedtime.  Marland Kitchen allopurinol (ZYLOPRIM) 100 MG tablet TAKE 1 TABLET BY MOUTH AT BEDTIME  . ALPRAZolam (XANAX) 0.25 MG tablet Take 0.25 mg by mouth as needed for anxiety.  Marland Kitchen amLODipine (NORVASC) 5 MG tablet Take 5 mg by mouth daily.  Marland Kitchen aspirin 81 MG tablet Take 81 mg by mouth daily.  . beta carotene w/minerals (OCUVITE) tablet Take 1 tablet by mouth every morning.  . Cholecalciferol 4000 UNITS CAPS Take 1 capsule by mouth every morning. Vitamin D 3  . diphenhydrAMINE (SIMPLY SLEEP) 25 MG tablet Take 25 mg by mouth at bedtime as needed for sleep.  Marland Kitchen docusate sodium (COLACE) 100 MG capsule Take 100 mg by mouth every morning.  . estrogens, conjugated, ([name]) 0.625 MG tablet Take 1 tablet (0.625 mg total) by mouth daily. Take daily for 21 days then do not take for 7 days.  . ferrous sulfate 325 (65 FE) MG tablet Take 325 mg by mouth 2 (two) times daily with a meal.   . LORazepam (ATIVAN) 0.5 MG tablet Take 0.5 mg by mouth as needed for anxiety.  . metoprolol succinate (TOPROL-XL) 25 MG 24 hr tablet Take 25 mg by mouth every morning.  . Multiple Vitamins-Minerals (HM MULTIVITAMIN ADULT GUMMY) CHEW Chew 2 tablets by mouth every morning.  Marland Kitchen omeprazole (PRILOSEC) 20 MG capsule Take 1 capsule (20 mg total) by mouth 2 (two) times daily before a meal.  . ondansetron (ZOFRAN-ODT) 8 MG disintegrating tablet Take 8 mg by mouth as needed for nausea or vomiting.  Marland Kitchen PARoxetine (PAXIL) 10 MG tablet Take 10 mg by mouth every morning.  . traZODone (DESYREL) 50 MG tablet Take 50 mg by mouth at bedtime.  . nicotine (NICOTROL) 10 MG inhaler Inhale 1 cartridge (1 continuous puffing total) into the lungs as  needed for smoking cessation.    Activities of Daily Living In your present state of health, do you have any difficulty performing the following activities: 10/21/2013  Is the patient deaf or have difficulty hearing? N  Hearing Y  Vision N  Difficulty concentrating or making decisions Y  Walking or climbing stairs? Y  Doing errands, shopping? Y    Patient Care Team: Rosalita Chessman, DO as PCP - General (Family Medicine) Ladene Artist, MD as Consulting Physician (Gastroenterology) Michaela Corner, DDS as Consulting Physician (Oral Surgery)    Assessment:    Cpe:  Exercise Activities and Dietary recommendations    Goals    None     Fall Risk Fall Risk  10/21/2013  Falls in the past year? No   Depression Screen PHQ 2/9 Scores 10/21/2013  PHQ - 2 Score 0     Cognitive Testing No flowsheet data found.  Immunization History  Administered Date(s) Administered  . Influenza-Unspecified 12/02/2013  . Pneumococcal Polysaccharide-23 10/21/2013  . Tdap 05/02/2013  . Zoster 11/02/2013  Screening Tests Health Maintenance  Topic Date Due  . DEXA SCAN  11/12/2001  . INFLUENZA VACCINE  10/03/2014  . PNA vac Low Risk Adult (2 of 2 - PCV13) 10/22/2014  . TETANUS/TDAP  05/03/2023  . COLONOSCOPY  02/01/2024  . ZOSTAVAX  Completed      Plan:    check labs GHM utd During the course of the visit the patient was educated and counseled about the following appropriate screening and preventive services:   Vaccines to include Pneumoccal, Influenza, Hepatitis B, Td, Zostavax, HCV  Electrocardiogram  Cardiovascular Disease  Colorectal cancer screening  Bone density screening  Diabetes screening  Glaucoma screening  Mammography/pap  Nutrition counseling   Patient Instructions (the written plan) was given to the patient.   1. Estrogen deficiency    2. Tobacco use disorder   - nicotine (NICOTROL) 10 MG inhaler; Inhale 1 cartridge (1 continuous puffing total)  into the lungs as needed for smoking cessation.  Dispense: 42 each; Refill: 3  3. Essential hypertension   - Basic metabolic panel; Future - CBC with Differential/Platelet; Future - Hepatic function panel; Future - Lipid panel; Future - POCT urinalysis dipstick; Future - TSH; Future  4. Preventative health care    5. Routine history and physical examination of adult     Garnet Koyanagi, DO  05/17/2014

## 2014-05-17 NOTE — Progress Notes (Signed)
lymph node

## 2014-05-17 NOTE — Patient Instructions (Addendum)
Your Bone Density Scan appointment is scheduled for Monday 05/23/2014 @ 2 pm. [facility], [address], [place], [place] [address] [phone]   Preventive Care for Adults A healthy lifestyle and preventive care can promote health and wellness. Preventive health guidelines for women include the following key practices.  A routine yearly physical is a good way to check with your health care provider about your health and preventive screening. It is a chance to share any concerns and updates on your health and to receive a thorough exam.  Visit your dentist for a routine exam and preventive care every 6 months. Brush your teeth twice a day and floss once a day. Good oral hygiene prevents tooth decay and gum disease.  The frequency of eye exams is based on your age, health, family medical history, use of contact lenses, and other factors. Follow your health care provider's recommendations for frequency of eye exams.  Eat a healthy diet. Foods like vegetables, fruits, whole grains, low-fat dairy products, and lean protein foods contain the nutrients you need without too many calories. Decrease your intake of foods high in solid fats, added sugars, and salt. Eat the right amount of calories for you.Get information about a proper diet from your health care provider, if necessary.  Regular physical exercise is one of the most important things you can do for your health. Most adults should get at least 150 minutes of moderate-intensity exercise (any activity that increases your heart rate and causes you to sweat) each week. In addition, most adults need muscle-strengthening exercises on 2 or more days a week.  Maintain a healthy weight. The body mass index (BMI) is a screening tool to identify possible weight problems. It provides an estimate of body fat based on height and weight. Your health care provider can find your BMI and can help you achieve or maintain a healthy weight.For adults 20  years and older:  A BMI below 18.5 is considered underweight.  A BMI of 18.5 to 24.9 is normal.  A BMI of 25 to 29.9 is considered overweight.  A BMI of 30 and above is considered obese.  Maintain normal blood lipids and cholesterol levels by exercising and minimizing your intake of saturated fat. Eat a balanced diet with plenty of fruit and vegetables. Blood tests for lipids and cholesterol should begin at age 37 and be repeated every 5 years. If your lipid or cholesterol levels are high, you are over 50, or you are at high risk for heart disease, you may need your cholesterol levels checked more frequently.Ongoing high lipid and cholesterol levels should be treated with medicines if diet and exercise are not working.  If you smoke, find out from your health care provider how to quit. If you do not use tobacco, do not start.  Lung cancer screening is recommended for adults aged 69-80 years who are at high risk for developing lung cancer because of a history of smoking. A yearly low-dose CT scan of the lungs is recommended for people who have at least a 30-pack-year history of smoking and are a current smoker or have quit within the past 15 years. A pack year of smoking is smoking an average of 1 pack of cigarettes a day for 1 year (for example: 1 pack a day for 30 years or 2 packs a day for 15 years). Yearly screening should continue until the smoker has stopped smoking for at least 15 years. Yearly screening should be stopped for people  who develop a health problem that would prevent them from having lung cancer treatment.  If you are pregnant, do not drink alcohol. If you are breastfeeding, be very cautious about drinking alcohol. If you are not pregnant and choose to drink alcohol, do not have more than 1 drink per day. One drink is considered to be 12 ounces (355 mL) of beer, 5 ounces (148 mL) of wine, or 1.5 ounces (44 mL) of liquor.  Avoid use of street drugs. Do not share needles with  anyone. Ask for help if you need support or instructions about stopping the use of drugs.  High blood pressure causes heart disease and increases the risk of stroke. Your blood pressure should be checked at least every 1 to 2 years. Ongoing high blood pressure should be treated with medicines if weight loss and exercise do not work.  If you are 32-40 years old, ask your health care provider if you should take aspirin to prevent strokes.  Diabetes screening involves taking a blood sample to check your fasting blood sugar level. This should be done once every 3 years, after age 34, if you are within normal weight and without risk factors for diabetes. Testing should be considered at a younger age or be carried out more frequently if you are overweight and have at least 1 risk factor for diabetes.  Breast cancer screening is essential preventive care for women. You should practice "breast self-awareness." This means understanding the normal appearance and feel of your breasts and may include breast self-examination. Any changes detected, no matter how small, should be reported to a health care provider. Women in their 72s and 30s should have a clinical breast exam (CBE) by a health care provider as part of a regular health exam every 1 to 3 years. After age 39, women should have a CBE every year. Starting at age 49, women should consider having a mammogram (breast X-ray test) every year. Women who have a family history of breast cancer should talk to their health care provider about genetic screening. Women at a high risk of breast cancer should talk to their health care providers about having an MRI and a mammogram every year.  Breast cancer gene (BRCA)-related cancer risk assessment is recommended for women who have family members with BRCA-related cancers. BRCA-related cancers include breast, ovarian, tubal, and peritoneal cancers. Having family members with these cancers may be associated with an  increased risk for harmful changes (mutations) in the breast cancer genes BRCA1 and BRCA2. Results of the assessment will determine the need for genetic counseling and BRCA1 and BRCA2 testing.  Routine pelvic exams to screen for cancer are no longer recommended for nonpregnant women who are considered low risk for cancer of the pelvic organs (ovaries, uterus, and vagina) and who do not have symptoms. Ask your health care provider if a screening pelvic exam is right for you.  If you have had past treatment for cervical cancer or a condition that could lead to cancer, you need Pap tests and screening for cancer for at least 20 years after your treatment. If Pap tests have been discontinued, your risk factors (such as having a new sexual partner) need to be reassessed to determine if screening should be resumed. Some women have medical problems that increase the chance of getting cervical cancer. In these cases, your health care provider may recommend more frequent screening and Pap tests.  The HPV test is an additional test that may be used for cervical  cancer screening. The HPV test looks for the virus that can cause the cell changes on the cervix. The cells collected during the Pap test can be tested for HPV. The HPV test could be used to screen women aged 61 years and older, and should be used in women of any age who have unclear Pap test results. After the age of 58, women should have HPV testing at the same frequency as a Pap test.  Colorectal cancer can be detected and often prevented. Most routine colorectal cancer screening begins at the age of 70 years and continues through age 71 years. However, your health care provider may recommend screening at an earlier age if you have risk factors for colon cancer. On a yearly basis, your health care provider may provide home test kits to check for hidden blood in the stool. Use of a small camera at the end of a tube, to directly examine the colon  (sigmoidoscopy or colonoscopy), can detect the earliest forms of colorectal cancer. Talk to your health care provider about this at age 47, when routine screening begins. Direct exam of the colon should be repeated every 5-10 years through age 28 years, unless early forms of pre-cancerous polyps or small growths are found.  People who are at an increased risk for hepatitis B should be screened for this virus. You are considered at high risk for hepatitis B if:  You were born in a country where hepatitis B occurs often. Talk with your health care provider about which countries are considered high risk.  Your parents were born in a high-risk country and you have not received a shot to protect against hepatitis B (hepatitis B vaccine).  You have HIV or AIDS.  You use needles to inject street drugs.  You live with, or have sex with, someone who has hepatitis B.  You get hemodialysis treatment.  You take certain medicines for conditions like cancer, organ transplantation, and autoimmune conditions.  Hepatitis C blood testing is recommended for all people born from [birth year] through 1963 and any individual with known risks for hepatitis C.  Practice safe sex. Use condoms and avoid high-risk sexual practices to reduce the spread of sexually transmitted infections (STIs). STIs include gonorrhea, chlamydia, syphilis, trichomonas, herpes, HPV, and human immunodeficiency virus (HIV). Herpes, HIV, and HPV are viral illnesses that have no cure. They can result in disability, cancer, and death.  You should be screened for sexually transmitted illnesses (STIs) including gonorrhea and chlamydia if:  You are sexually active and are younger than 24 years.  You are older than 24 years and your health care provider tells you that you are at risk for this type of infection.  Your sexual activity has changed since you were last screened and you are at an increased risk for chlamydia or gonorrhea. Ask your health  care provider if you are at risk.  If you are at risk of being infected with HIV, it is recommended that you take a prescription medicine daily to prevent HIV infection. This is called preexposure prophylaxis (PrEP). You are considered at risk if:  You are a heterosexual woman, are sexually active, and are at increased risk for HIV infection.  You take drugs by injection.  You are sexually active with a partner who has HIV.  Talk with your health care provider about whether you are at high risk of being infected with HIV. If you choose to begin PrEP, you should first be tested for HIV. You should  then be tested every 3 months for as long as you are taking PrEP.  Osteoporosis is a disease in which the bones lose minerals and strength with aging. This can result in serious bone fractures or breaks. The risk of osteoporosis can be identified using a bone density scan. Women ages 22 years and over and women at risk for fractures or osteoporosis should discuss screening with their health care providers. Ask your health care provider whether you should take a calcium supplement or vitamin D to reduce the rate of osteoporosis.  Menopause can be associated with physical symptoms and risks. Hormone replacement therapy is available to decrease symptoms and risks. You should talk to your health care provider about whether hormone replacement therapy is right for you.  Use sunscreen. Apply sunscreen liberally and repeatedly throughout the day. You should seek shade when your shadow is shorter than you. Protect yourself by wearing long sleeves, pants, a wide-brimmed hat, and sunglasses year round, whenever you are outdoors.  Once a month, do a whole body skin exam, using a mirror to look at the skin on your back. Tell your health care provider of new moles, moles that have irregular borders, moles that are larger than a pencil eraser, or moles that have changed in shape or color.  Stay current with required  vaccines (immunizations).  Influenza vaccine. All adults should be immunized every year.  Tetanus, diphtheria, and acellular pertussis (Td, Tdap) vaccine. Pregnant women should receive 1 dose of Tdap vaccine during each pregnancy. The dose should be obtained regardless of the length of time since the last dose. Immunization is preferred during the 27th-36th week of gestation. An adult who has not previously received Tdap or who does not know her vaccine status should receive 1 dose of Tdap. This initial dose should be followed by tetanus and diphtheria toxoids (Td) booster doses every 10 years. Adults with an unknown or incomplete history of completing a 3-dose immunization series with Td-containing vaccines should begin or complete a primary immunization series including a Tdap dose. Adults should receive a Td booster every 10 years.  Varicella vaccine. An adult without evidence of immunity to varicella should receive 2 doses or a second dose if she has previously received 1 dose. Pregnant females who do not have evidence of immunity should receive the first dose after pregnancy. This first dose should be obtained before leaving the health care facility. The second dose should be obtained 4-8 weeks after the first dose.  Human papillomavirus (HPV) vaccine. Females aged 13-26 years who have not received the vaccine previously should obtain the 3-dose series. The vaccine is not recommended for use in pregnant females. However, pregnancy testing is not needed before receiving a dose. If a female is found to be pregnant after receiving a dose, no treatment is needed. In that case, the remaining doses should be delayed until after the pregnancy. Immunization is recommended for any person with an immunocompromised condition through the age of 69 years if she did not get any or all doses earlier. During the 3-dose series, the second dose should be obtained 4-8 weeks after the first dose. The third dose should be  obtained 24 weeks after the first dose and 16 weeks after the second dose.  Zoster vaccine. One dose is recommended for adults aged 69 years or older unless certain conditions are present.  Measles, mumps, and rubella (MMR) vaccine. Adults born before [birth year] generally are considered immune to measles and mumps. Adults born in [birth year]  or later should have 1 or more doses of MMR vaccine unless there is a contraindication to the vaccine or there is laboratory evidence of immunity to each of the three diseases. A routine second dose of MMR vaccine should be obtained at least 28 days after the first dose for students attending postsecondary schools, health care workers, or international travelers. People who received inactivated measles vaccine or an unknown type of measles vaccine during 1963-1967 should receive 2 doses of MMR vaccine. People who received inactivated mumps vaccine or an unknown type of mumps vaccine before 1979 and are at high risk for mumps infection should consider immunization with 2 doses of MMR vaccine. For females of childbearing age, rubella immunity should be determined. If there is no evidence of immunity, females who are not pregnant should be vaccinated. If there is no evidence of immunity, females who are pregnant should delay immunization until after pregnancy. Unvaccinated health care workers born before [birth year] who lack laboratory evidence of measles, mumps, or rubella immunity or laboratory confirmation of disease should consider measles and mumps immunization with 2 doses of MMR vaccine or rubella immunization with 1 dose of MMR vaccine.  Pneumococcal 13-valent conjugate (PCV13) vaccine. When indicated, a person who is uncertain of her immunization history and has no record of immunization should receive the PCV13 vaccine. An adult aged 101 years or older who has certain medical conditions and has not been previously immunized should receive 1 dose of PCV13 vaccine. This PCV13 should be  followed with a dose of pneumococcal polysaccharide (PPSV23) vaccine. The PPSV23 vaccine dose should be obtained at least 8 weeks after the dose of PCV13 vaccine. An adult aged 1 years or older who has certain medical conditions and previously received 1 or more doses of PPSV23 vaccine should receive 1 dose of PCV13. The PCV13 vaccine dose should be obtained 1 or more years after the last PPSV23 vaccine dose.  Pneumococcal polysaccharide (PPSV23) vaccine. When PCV13 is also indicated, PCV13 should be obtained first. All adults aged 65 years and older should be immunized. An adult younger than age 75 years who has certain medical conditions should be immunized. Any person who resides in a nursing home or long-term care facility should be immunized. An adult smoker should be immunized. People with an immunocompromised condition and certain other conditions should receive both PCV13 and PPSV23 vaccines. People with human immunodeficiency virus (HIV) infection should be immunized as soon as possible after diagnosis. Immunization during chemotherapy or radiation therapy should be avoided. Routine use of PPSV23 vaccine is not recommended for American Indians, [place] Natives, or people younger than 65 years unless there are medical conditions that require PPSV23 vaccine. When indicated, people who have unknown immunization and have no record of immunization should receive PPSV23 vaccine. One-time revaccination 5 years after the first dose of PPSV23 is recommended for people aged 19-64 years who have chronic kidney failure, nephrotic syndrome, asplenia, or immunocompromised conditions. People who received 1-2 doses of PPSV23 before age 12 years should receive another dose of PPSV23 vaccine at age 61 years or later if at least 5 years have passed since the previous dose. Doses of PPSV23 are not needed for people immunized with PPSV23 at or after age 52 years.  Meningococcal vaccine. Adults with asplenia or persistent  complement component deficiencies should receive 2 doses of quadrivalent meningococcal conjugate (MenACWY-D) vaccine. The doses should be obtained at least 2 months apart. Microbiologists working with certain meningococcal bacteria, [employer] recruits, people at risk during  an outbreak, and people who travel to or live in countries with a high rate of meningitis should be immunized. A first-year college student up through age 62 years who is living in a residence hall should receive a dose if she did not receive a dose on or after her 16th birthday. Adults who have certain high-risk conditions should receive one or more doses of vaccine.  Hepatitis A vaccine. Adults who wish to be protected from this disease, have certain high-risk conditions, work with hepatitis A-infected animals, work in hepatitis A research labs, or travel to or work in countries with a high rate of hepatitis A should be immunized. Adults who were previously unvaccinated and who anticipate close contact with an international adoptee during the first 60 days after arrival in the [place] from a country with a high rate of hepatitis A should be immunized.  Hepatitis B vaccine. Adults who wish to be protected from this disease, have certain high-risk conditions, may be exposed to blood or other infectious body fluids, are household contacts or sex partners of hepatitis B positive people, are clients or workers in certain care facilities, or travel to or work in countries with a high rate of hepatitis B should be immunized.  Haemophilus influenzae type b (Hib) vaccine. A previously unvaccinated person with asplenia or sickle cell disease or having a scheduled splenectomy should receive 1 dose of Hib vaccine. Regardless of previous immunization, a recipient of a hematopoietic stem cell transplant should receive a 3-dose series 6-12 months after her successful transplant. Hib vaccine is not recommended for adults with HIV  infection. Preventive Services / Frequency Ages 83 to 57 years  Blood pressure check.** / Every 1 to 2 years.  Lipid and cholesterol check.** / Every 5 years beginning at age 40.  Clinical breast exam.** / Every 3 years for women in their 66s and 76s.  BRCA-related cancer risk assessment.** / For women who have family members with a BRCA-related cancer (breast, ovarian, tubal, or peritoneal cancers).  Pap test.** / Every 2 years from ages 57 through 44. Every 3 years starting at age 40 through age 70 or 65 with a history of 3 consecutive normal Pap tests.  HPV screening.** / Every 3 years from ages 39 through ages 23 to 22 with a history of 3 consecutive normal Pap tests.  Hepatitis C blood test.** / For any individual with known risks for hepatitis C.  Skin self-exam. / Monthly.  Influenza vaccine. / Every year.  Tetanus, diphtheria, and acellular pertussis (Tdap, Td) vaccine.** / Consult your health care provider. Pregnant women should receive 1 dose of Tdap vaccine during each pregnancy. 1 dose of Td every 10 years.  Varicella vaccine.** / Consult your health care provider. Pregnant females who do not have evidence of immunity should receive the first dose after pregnancy.  HPV vaccine. / 3 doses over 6 months, if 53 and younger. The vaccine is not recommended for use in pregnant females. However, pregnancy testing is not needed before receiving a dose.  Measles, mumps, rubella (MMR) vaccine.** / You need at least 1 dose of MMR if you were born in 1957 or later. You may also need a 2nd dose. For females of childbearing age, rubella immunity should be determined. If there is no evidence of immunity, females who are not pregnant should be vaccinated. If there is no evidence of immunity, females who are pregnant should delay immunization until after pregnancy.  Pneumococcal 13-valent conjugate (PCV13) vaccine.** / Consult  your health care provider.  Pneumococcal polysaccharide  (PPSV23) vaccine.** / 1 to 2 doses if you smoke cigarettes or if you have certain conditions.  Meningococcal vaccine.** / 1 dose if you are age 78 to 81 years and a Market researcher living in a residence hall, or have one of several medical conditions, you need to get vaccinated against meningococcal disease. You may also need additional booster doses.  Hepatitis A vaccine.** / Consult your health care provider.  Hepatitis B vaccine.** / Consult your health care provider.  Haemophilus influenzae type b (Hib) vaccine.** / Consult your health care provider. Ages 6 to 6 years  Blood pressure check.** / Every 1 to 2 years.  Lipid and cholesterol check.** / Every 5 years beginning at age 69 years.  Lung cancer screening. / Every year if you are aged 74-80 years and have a 30-pack-year history of smoking and currently smoke or have quit within the past 15 years. Yearly screening is stopped once you have quit smoking for at least 15 years or develop a health problem that would prevent you from having lung cancer treatment.  Clinical breast exam.** / Every year after age 52 years.  BRCA-related cancer risk assessment.** / For women who have family members with a BRCA-related cancer (breast, ovarian, tubal, or peritoneal cancers).  Mammogram.** / Every year beginning at age 57 years and continuing for as long as you are in good health. Consult with your health care provider.  Pap test.** / Every 3 years starting at age 28 years through age 37 or 80 years with a history of 3 consecutive normal Pap tests.  HPV screening.** / Every 3 years from ages 43 years through ages 16 to 80 years with a history of 3 consecutive normal Pap tests.  Fecal occult blood test (FOBT) of stool. / Every year beginning at age 22 years and continuing until age 70 years. You may not need to do this test if you get a colonoscopy every 10 years.  Flexible sigmoidoscopy or colonoscopy.** / Every 5 years for a  flexible sigmoidoscopy or every 10 years for a colonoscopy beginning at age 71 years and continuing until age 43 years.  Hepatitis C blood test.** / For all people born from [birth year] through 1963 and any individual with known risks for hepatitis C.  Skin self-exam. / Monthly.  Influenza vaccine. / Every year.  Tetanus, diphtheria, and acellular pertussis (Tdap/Td) vaccine.** / Consult your health care provider. Pregnant women should receive 1 dose of Tdap vaccine during each pregnancy. 1 dose of Td every 10 years.  Varicella vaccine.** / Consult your health care provider. Pregnant females who do not have evidence of immunity should receive the first dose after pregnancy.  Zoster vaccine.** / 1 dose for adults aged 58 years or older.  Measles, mumps, rubella (MMR) vaccine.** / You need at least 1 dose of MMR if you were born in 1955 or later. You may also need a 2nd dose. For females of childbearing age, rubella immunity should be determined. If there is no evidence of immunity, females who are not pregnant should be vaccinated. If there is no evidence of immunity, females who are pregnant should delay immunization until after pregnancy.  Pneumococcal 13-valent conjugate (PCV13) vaccine.** / Consult your health care provider.  Pneumococcal polysaccharide (PPSV23) vaccine.** / 1 to 2 doses if you smoke cigarettes or if you have certain conditions.  Meningococcal vaccine.** / Consult your health care provider.  Hepatitis A vaccine.** / Consult your  health care provider.  Hepatitis B vaccine.** / Consult your health care provider.  Haemophilus influenzae type b (Hib) vaccine.** / Consult your health care provider. Ages 37 years and over  Blood pressure check.** / Every 1 to 2 years.  Lipid and cholesterol check.** / Every 5 years beginning at age 44 years.  Lung cancer screening. / Every year if you are aged 70-80 years and have a 30-pack-year history of smoking and currently smoke or have  quit within the past 15 years. Yearly screening is stopped once you have quit smoking for at least 15 years or develop a health problem that would prevent you from having lung cancer treatment.  Clinical breast exam.** / Every year after age 23 years.  BRCA-related cancer risk assessment.** / For women who have family members with a BRCA-related cancer (breast, ovarian, tubal, or peritoneal cancers).  Mammogram.** / Every year beginning at age 87 years and continuing for as long as you are in good health. Consult with your health care provider.  Pap test.** / Every 3 years starting at age 60 years through age 9 or 90 years with 3 consecutive normal Pap tests. Testing can be stopped between 65 and 70 years with 3 consecutive normal Pap tests and no abnormal Pap or HPV tests in the past 10 years.  HPV screening.** / Every 3 years from ages 33 years through ages 50 or 62 years with a history of 3 consecutive normal Pap tests. Testing can be stopped between 65 and 70 years with 3 consecutive normal Pap tests and no abnormal Pap or HPV tests in the past 10 years.  Fecal occult blood test (FOBT) of stool. / Every year beginning at age 44 years and continuing until age 31 years. You may not need to do this test if you get a colonoscopy every 10 years.  Flexible sigmoidoscopy or colonoscopy.** / Every 5 years for a flexible sigmoidoscopy or every 10 years for a colonoscopy beginning at age 31 years and continuing until age 87 years.  Hepatitis C blood test.** / For all people born from [birth year] through 1963 and any individual with known risks for hepatitis C.  Osteoporosis screening.** / A one-time screening for women ages 40 years and over and women at risk for fractures or osteoporosis.  Skin self-exam. / Monthly.  Influenza vaccine. / Every year.  Tetanus, diphtheria, and acellular pertussis (Tdap/Td) vaccine.** / 1 dose of Td every 10 years.  Varicella vaccine.** / Consult your health care  provider.  Zoster vaccine.** / 1 dose for adults aged 91 years or older.  Pneumococcal 13-valent conjugate (PCV13) vaccine.** / Consult your health care provider.  Pneumococcal polysaccharide (PPSV23) vaccine.** / 1 dose for all adults aged 5 years and older.  Meningococcal vaccine.** / Consult your health care provider.  Hepatitis A vaccine.** / Consult your health care provider.  Hepatitis B vaccine.** / Consult your health care provider.  Haemophilus influenzae type b (Hib) vaccine.** / Consult your health care provider. ** Family history and personal history of risk and conditions may change your health care provider's recommendations. Document Released: 04/16/2001 Document Revised: 07/05/2013 Document Reviewed: 07/16/2010 [facility] Patient Information 2015 [place], [place]. This information is not intended to replace advice given to you by your health care provider. Make sure you discuss any questions you have with your health care provider.

## 2014-05-23 DIAGNOSIS — Z13820 Encounter for screening for osteoporosis: Secondary | ICD-10-CM | POA: Diagnosis not present

## 2014-05-24 ENCOUNTER — Other Ambulatory Visit (INDEPENDENT_AMBULATORY_CARE_PROVIDER_SITE_OTHER): Payer: Medicare Other

## 2014-05-24 DIAGNOSIS — I10 Essential (primary) hypertension: Secondary | ICD-10-CM | POA: Diagnosis not present

## 2014-05-24 LAB — CBC WITH DIFFERENTIAL/PLATELET
Basophils Absolute: 0.1 10*3/uL (ref 0.0–0.1)
Basophils Relative: 0.8 % (ref 0.0–3.0)
Eosinophils Absolute: 0.3 10*3/uL (ref 0.0–0.7)
Eosinophils Relative: 2.2 % (ref 0.0–5.0)
HCT: 36.1 % (ref 36.0–46.0)
HEMOGLOBIN: 11.6 g/dL — AB (ref 12.0–15.0)
LYMPHS PCT: 14.6 % (ref 12.0–46.0)
Lymphs Abs: 1.8 10*3/uL (ref 0.7–4.0)
MCHC: 32.1 g/dL (ref 30.0–36.0)
MCV: 99.2 fl (ref 78.0–100.0)
Monocytes Absolute: 0.6 10*3/uL (ref 0.1–1.0)
Monocytes Relative: 5.2 % (ref 3.0–12.0)
NEUTROS ABS: 9.6 10*3/uL — AB (ref 1.4–7.7)
Neutrophils Relative %: 77.2 % — ABNORMAL HIGH (ref 43.0–77.0)
PLATELETS: 386 10*3/uL (ref 150.0–400.0)
RBC: 3.64 Mil/uL — ABNORMAL LOW (ref 3.87–5.11)
RDW: 14.9 % (ref 11.5–15.5)
WBC: 12.4 10*3/uL — ABNORMAL HIGH (ref 4.0–10.5)

## 2014-05-24 LAB — BASIC METABOLIC PANEL
BUN: 22 mg/dL (ref 6–23)
CO2: 29 mEq/L (ref 19–32)
Calcium: 9.1 mg/dL (ref 8.4–10.5)
Chloride: 102 mEq/L (ref 96–112)
Creatinine, Ser: 1.45 mg/dL — ABNORMAL HIGH (ref 0.40–1.20)
GFR: 37.17 mL/min — ABNORMAL LOW (ref >60.00)
Glucose, Bld: 82 mg/dL (ref 70–99)
Potassium: 3.7 mEq/L (ref 3.5–5.1)
SODIUM: 138 meq/L (ref 135–145)

## 2014-05-24 LAB — POCT URINALYSIS DIPSTICK
Bilirubin, UA: NEGATIVE
Blood, UA: NEGATIVE
GLUCOSE UA: NEGATIVE
Ketones, UA: NEGATIVE
Leukocytes, UA: NEGATIVE
Nitrite, UA: NEGATIVE
PH UA: 6
Protein, UA: NEGATIVE
SPEC GRAV UA: 1.02
Urobilinogen, UA: NEGATIVE

## 2014-05-24 LAB — LIPID PANEL
CHOL/HDL RATIO: 2
Cholesterol: 168 mg/dL (ref 0–200)
HDL: 85.3 mg/dL (ref >39.00)
LDL Cholesterol: 63 mg/dL (ref 0–99)
NonHDL: 82.7
Triglycerides: 100 mg/dL (ref 0.0–149.0)
VLDL: 20 mg/dL (ref 0.0–40.0)

## 2014-05-24 LAB — HEPATIC FUNCTION PANEL
ALBUMIN: 3.8 g/dL (ref 3.5–5.2)
ALT: 12 U/L (ref 0–35)
AST: 20 U/L (ref 0–37)
Alkaline Phosphatase: 95 U/L (ref 39–117)
Bilirubin, Direct: 0.1 mg/dL (ref 0.0–0.3)
Total Bilirubin: 0.3 mg/dL (ref 0.2–1.2)
Total Protein: 6.9 g/dL (ref 6.0–8.3)

## 2014-05-24 LAB — TSH: TSH: 3.95 u[IU]/mL (ref 0.35–4.50)

## 2014-05-29 ENCOUNTER — Encounter: Payer: Self-pay | Admitting: Family Medicine

## 2014-05-30 MED ORDER — TRAZODONE HCL 50 MG PO TABS: 50.0000 mg | ORAL_TABLET | Freq: Every day | ORAL | Status: DC

## 2014-05-30 NOTE — Telephone Encounter (Signed)
Please advise on Trazodone Rx.       KP

## 2014-05-30 NOTE — Telephone Encounter (Signed)
Ok to fill trazadone #30  1 po qhs prn sleep,  2 refills

## 2014-06-09 ENCOUNTER — Other Ambulatory Visit: Payer: Self-pay | Admitting: Family Medicine

## 2014-06-09 NOTE — Telephone Encounter (Signed)
Last seen 05/17/14 and filled 01/24/14    Please advise     KP

## 2014-06-16 ENCOUNTER — Telehealth: Payer: Self-pay

## 2014-06-16 MED ORDER — ALENDRONATE SODIUM 70 MG PO TABS: 70.0000 mg | ORAL_TABLET | ORAL | Status: DC

## 2014-06-16 NOTE — Telephone Encounter (Signed)
BMD received from [facility]: the patient has Osteoporosis and per Dr.Lowne Take calcium with vitamin d daily and start Fosamax 70 mg po weekly. The patient verbalized understanding and agreed. Rx faxed, no question at this time but she will call back if she has any.     KP

## 2014-06-27 ENCOUNTER — Other Ambulatory Visit: Payer: Self-pay | Admitting: Family Medicine

## 2014-06-28 ENCOUNTER — Encounter: Payer: Self-pay | Admitting: Family Medicine

## 2014-06-28 ENCOUNTER — Ambulatory Visit (HOSPITAL_BASED_OUTPATIENT_CLINIC_OR_DEPARTMENT_OTHER)
Admission: RE | Admit: 2014-06-28 | Discharge: 2014-06-28 | Disposition: A | Discharge status: Home / Self Care | Payer: Medicare Other | Source: Ambulatory Visit | Attending: Family Medicine | Admitting: Family Medicine

## 2014-06-28 ENCOUNTER — Ambulatory Visit (INDEPENDENT_AMBULATORY_CARE_PROVIDER_SITE_OTHER): Payer: Medicare Other | Admitting: Family Medicine

## 2014-06-28 VITALS — BP 158/76 | HR 111 | Temp 99.4°F | Resp 18 | Ht 67.0 in | Wt 104.0 lb

## 2014-06-28 DIAGNOSIS — R062 Wheezing: Secondary | ICD-10-CM | POA: Insufficient documentation

## 2014-06-28 DIAGNOSIS — R05 Cough: Secondary | ICD-10-CM

## 2014-06-28 DIAGNOSIS — J209 Acute bronchitis, unspecified: Secondary | ICD-10-CM

## 2014-06-28 DIAGNOSIS — R059 Cough, unspecified: Secondary | ICD-10-CM

## 2014-06-28 DIAGNOSIS — R0602 Shortness of breath: Secondary | ICD-10-CM | POA: Diagnosis not present

## 2014-06-28 MED ORDER — CEFTRIAXONE SODIUM 1 G IJ SOLR
1.0000 g | Freq: Once | INTRAMUSCULAR | Status: AC
  Administered 2014-06-28: 1 g via INTRAMUSCULAR

## 2014-06-28 MED ORDER — LEVOFLOXACIN 500 MG PO TABS: 500.0000 mg | ORAL_TABLET | Freq: Every day | ORAL | Status: DC

## 2014-06-28 NOTE — Progress Notes (Signed)
Patient ID: Marissa Cruz, female    DOB: 1934/07/20  Age: 80 y.o. MRN: [id number]    Subjective:  Subjective HPI Marissa Cruz presents for cough since last Thursday.  No fevers + chills   No cp  Review of Systems  Constitutional: Positive for chills and fatigue. Negative for fever.  HENT: Positive for congestion and postnasal drip. Negative for rhinorrhea and sinus pressure.   Respiratory: Positive for cough, chest tightness, shortness of breath and wheezing.   Cardiovascular: Negative for chest pain, palpitations and leg swelling.  Allergic/Immunologic: Negative for environmental allergies.  Psychiatric/Behavioral: Negative for confusion, dysphoric mood and decreased concentration. The patient is not nervous/anxious.     History Past Medical History  Diagnosis Date  . Arthritis     Thumbs  . Throat cancer     Vocal cord Cancer  . Chicken pox   . Depression   . Diverticulitis   . GERD (gastroesophageal reflux disease)   . Hypertension   . Kidney disease   . Colon polyp   . Stroke     Mini Stroke  . Carotid artery disease   . Oral-mouth cancer     She has past surgical history that includes Appendectomy; Tonsillectomy and adenoidectomy; Vaginal hysterectomy; Total hip arthroplasty (Left); Carotid endarterectomy; Colonoscopy (N/A, 01/31/2014); and mouth cancer.   Her family history includes Hypertension in her father.She reports that she has been smoking Cigarettes.  She has been smoking about 0.50 packs per day. She has never used smokeless tobacco. She reports that she drinks alcohol. She reports that she does not use illicit drugs.  Current Outpatient Prescriptions on File Prior to Visit  Medication Sig Dispense Refill  . acetaminophen (TYLENOL) 650 MG CR tablet Take 1,300 mg by mouth at bedtime.    Marland Kitchen alendronate (FOSAMAX) 70 MG tablet Take 1 tablet (70 mg total) by mouth once a week. Take with a full glass of water on an empty stomach. 4 tablet 11  . allopurinol  (ZYLOPRIM) 100 MG tablet TAKE 1 TABLET BY MOUTH AT BEDTIME 90 tablet 0  . ALPRAZolam (XANAX) 0.25 MG tablet Take 0.25 mg by mouth as needed for anxiety.    Marland Kitchen amLODipine (NORVASC) 5 MG tablet TAKE 1 TABLET BY MOUTH DAILY FOR HYPEFRTENSION 90 tablet 1  . aspirin 81 MG tablet Take 81 mg by mouth daily.    . beta carotene w/minerals (OCUVITE) tablet Take 1 tablet by mouth every morning.    . Cholecalciferol 4000 UNITS CAPS Take 1 capsule by mouth every morning. Vitamin D 3    . diphenhydrAMINE (SIMPLY SLEEP) 25 MG tablet Take 25 mg by mouth at bedtime as needed for sleep.    Marland Kitchen docusate sodium (COLACE) 100 MG capsule Take 100 mg by mouth every morning.    . estrogens, conjugated, ([name]) 0.625 MG tablet Take 1 tablet (0.625 mg total) by mouth daily. Take daily for 21 days then do not take for 7 days. 90 tablet 0  . ferrous sulfate 325 (65 FE) MG tablet Take 325 mg by mouth 2 (two) times daily with a meal.     . LORazepam (ATIVAN) 0.5 MG tablet Take 0.5 mg by mouth as needed for anxiety.    . metoprolol succinate (TOPROL-XL) 25 MG 24 hr tablet Take 25 mg by mouth every morning.    . mirtazapine (REMERON SOL-TAB) 15 MG disintegrating tablet TAKE 1 TABLET BY MOUTH EVERY NIGHT AT BEDTIME 90 tablet 0  . Multiple Vitamins-Minerals (HM MULTIVITAMIN ADULT GUMMY) CHEW Chew  2 tablets by mouth every morning.    . nicotine (NICOTROL) 10 MG inhaler Inhale 1 cartridge (1 continuous puffing total) into the lungs as needed for smoking cessation. 42 each 3  . omeprazole (PRILOSEC) 20 MG capsule Take 1 capsule (20 mg total) by mouth 2 (two) times daily before a meal. 60 capsule 11  . ondansetron (ZOFRAN-ODT) 8 MG disintegrating tablet Take 8 mg by mouth as needed for nausea or vomiting.    Marland Kitchen PARoxetine (PAXIL) 10 MG tablet TAKE 1 TABLET BY MOUTH DAILY FOR DEPRESSION 90 tablet 1  . traZODone (DESYREL) 50 MG tablet Take 1 tablet (50 mg total) by mouth at bedtime. 30 tablet 2   No current facility-administered  medications on file prior to visit.     Objective:  Objective Physical Exam  Constitutional: She is oriented to person, place, and time. She appears well-developed and well-nourished.  HENT:  Right Ear: External ear normal.  Left Ear: External ear normal.  + PND + errythema  Eyes: Conjunctivae are normal. Right eye exhibits no discharge. Left eye exhibits no discharge.  Cardiovascular: Normal rate, regular rhythm and normal heart sounds.   No murmur heard. Pulmonary/Chest: Effort normal and breath sounds normal. No respiratory distress. She has no wheezes. She has no rales. She exhibits no tenderness.  Musculoskeletal: She exhibits no edema.  Lymphadenopathy:    She has cervical adenopathy.  Neurological: She is alert and oriented to person, place, and time.  Nursing note and vitals reviewed.  BP 158/76 mmHg  Pulse 111  Temp(Src) 99.4 F (37.4 C) (Oral)  Resp 18  Ht '5\' 7"'$  (1.702 m)  Wt 104 lb (47.174 kg)  BMI 16.28 kg/m2  SpO2 93% Wt Readings from Last 3 Encounters:  06/28/14 104 lb (47.174 kg)  05/17/14 105 lb 9.6 oz (47.9 kg)  01/24/14 101 lb 12.8 oz (46.176 kg)     Lab Results  Component Value Date   WBC 12.8* 06/28/2014   HGB 11.8* 06/28/2014   HCT 36.3 06/28/2014   PLT 478.0* 06/28/2014   GLUCOSE 85 06/28/2014   CHOL 168 05/24/2014   TRIG 100.0 05/24/2014   HDL 85.30 05/24/2014   LDLDIRECT 73.2 10/21/2013   LDLCALC 63 05/24/2014   ALT 10 06/28/2014   AST 18 06/28/2014   NA 139 06/28/2014   K 4.0 06/28/2014   CL 100 06/28/2014   CREATININE 1.83* 06/28/2014   BUN 24* 06/28/2014   CO2 31 06/28/2014   TSH 3.95 05/24/2014    No results found.   Assessment & Plan:  Plan I am having Ms. Mesch start on levofloxacin. I am also having her maintain her metoprolol succinate, ferrous sulfate, docusate sodium, beta carotene w/minerals, aspirin, Cholecalciferol, HM MULTIVITAMIN ADULT GUMMY, acetaminophen, diphenhydrAMINE, ondansetron, ALPRAZolam, LORazepam,  omeprazole, estrogens (conjugated), allopurinol, nicotine, traZODone, mirtazapine, alendronate, amLODipine, and PARoxetine. We administered cefTRIAXone.  Meds ordered this encounter  Medications  . levofloxacin (LEVAQUIN) 500 MG tablet    Sig: Take 1 tablet (500 mg total) by mouth daily.    Dispense:  7 tablet    Refill:  0  . cefTRIAXone (ROCEPHIN) injection 1 g    Sig:     Order Specific Question:  Antibiotic Indication:    Answer:  Other Indication (list below)    Problem List Items Addressed This Visit    Acute bronchitis   Relevant Medications   levofloxacin (LEVAQUIN) 500 MG tablet   cefTRIAXone (ROCEPHIN) injection 1 g (Completed)    Other Visit Diagnoses  Cough    -  Primary    Relevant Orders    DG Chest 2 View (Completed)    Basic metabolic panel (Completed)    CBC with Differential/Platelet (Completed)    Hepatic function panel (Completed)       Follow-up: Return in about 2 weeks (around 07/12/2014), or if symptoms worsen or fail to improve, for f/u.  Garnet Koyanagi, DO

## 2014-06-28 NOTE — Patient Instructions (Signed)

## 2014-06-28 NOTE — Progress Notes (Signed)
Pre visit review using our clinic review tool, if applicable. No additional management support is needed unless otherwise documented below in the visit note. 

## 2014-06-29 LAB — BASIC METABOLIC PANEL
BUN: 24 mg/dL — AB (ref 6–23)
CO2: 31 meq/L (ref 19–32)
Calcium: 9.5 mg/dL (ref 8.4–10.5)
Chloride: 100 mEq/L (ref 96–112)
Creatinine, Ser: 1.83 mg/dL — ABNORMAL HIGH (ref 0.40–1.20)
GFR: 28.4 mL/min — ABNORMAL LOW (ref >60.00)
GLUCOSE: 85 mg/dL (ref 70–99)
Potassium: 4 mEq/L (ref 3.5–5.1)
SODIUM: 139 meq/L (ref 135–145)

## 2014-06-29 LAB — CBC WITH DIFFERENTIAL/PLATELET
Basophils Absolute: 0.2 10*3/uL — ABNORMAL HIGH (ref 0.0–0.1)
Basophils Relative: 1.5 % (ref 0.0–3.0)
EOS PCT: 1.8 % (ref 0.0–5.0)
Eosinophils Absolute: 0.2 10*3/uL (ref 0.0–0.7)
HCT: 36.3 % (ref 36.0–46.0)
Hemoglobin: 11.8 g/dL — ABNORMAL LOW (ref 12.0–15.0)
Lymphocytes Relative: 16.1 % (ref 12.0–46.0)
Lymphs Abs: 2.1 10*3/uL (ref 0.7–4.0)
MCHC: 32.5 g/dL (ref 30.0–36.0)
MCV: 97.6 fl (ref 78.0–100.0)
MONOS PCT: 6.7 % (ref 3.0–12.0)
Monocytes Absolute: 0.9 10*3/uL (ref 0.1–1.0)
NEUTROS PCT: 73.9 % (ref 43.0–77.0)
Neutro Abs: 9.4 10*3/uL — ABNORMAL HIGH (ref 1.4–7.7)
PLATELETS: 478 10*3/uL — AB (ref 150.0–400.0)
RBC: 3.72 Mil/uL — ABNORMAL LOW (ref 3.87–5.11)
RDW: 14.4 % (ref 11.5–15.5)
WBC: 12.8 10*3/uL — AB (ref 4.0–10.5)

## 2014-06-29 LAB — HEPATIC FUNCTION PANEL
ALBUMIN: 4 g/dL (ref 3.5–5.2)
ALK PHOS: 101 U/L (ref 39–117)
ALT: 10 U/L (ref 0–35)
AST: 18 U/L (ref 0–37)
Bilirubin, Direct: 0 mg/dL (ref 0.0–0.3)
Total Bilirubin: 0.2 mg/dL (ref 0.2–1.2)
Total Protein: 7.4 g/dL (ref 6.0–8.3)

## 2014-06-30 ENCOUNTER — Encounter: Payer: Self-pay | Admitting: Family Medicine

## 2014-07-04 ENCOUNTER — Other Ambulatory Visit: Payer: Self-pay

## 2014-07-04 DIAGNOSIS — R945 Abnormal results of liver function studies: Principal | ICD-10-CM

## 2014-07-04 DIAGNOSIS — R7989 Other specified abnormal findings of blood chemistry: Secondary | ICD-10-CM

## 2014-07-11 ENCOUNTER — Encounter: Payer: Self-pay | Admitting: Family Medicine

## 2014-07-11 ENCOUNTER — Ambulatory Visit (INDEPENDENT_AMBULATORY_CARE_PROVIDER_SITE_OTHER): Payer: Medicare Other | Admitting: Family Medicine

## 2014-07-11 VITALS — BP 112/70 | HR 116 | Temp 98.2°F | Wt 105.0 lb

## 2014-07-11 DIAGNOSIS — J302 Other seasonal allergic rhinitis: Secondary | ICD-10-CM

## 2014-07-11 DIAGNOSIS — J209 Acute bronchitis, unspecified: Secondary | ICD-10-CM | POA: Diagnosis not present

## 2014-07-11 MED ORDER — AZELASTINE-FLUTICASONE 137-50 MCG/ACT NA SUSP: 1.0000 | Freq: Two times a day (BID) | NASAL | Status: DC

## 2014-07-11 NOTE — Progress Notes (Signed)
Subjective:    Patient ID: Marissa Cruz, female    DOB: [birth year], 80 y.o.   MRN: [id number]  HPI  Patient here for f/u bronchitis.  She still has some cough but is dry and overall symptoms are better.   Past Medical History  Diagnosis Date  . Arthritis     Thumbs  . Throat cancer     Vocal cord Cancer  . Chicken pox   . Depression   . Diverticulitis   . GERD (gastroesophageal reflux disease)   . Hypertension   . Kidney disease   . Colon polyp   . Stroke     Mini Stroke  . Carotid artery disease   . Oral-mouth cancer     Review of Systems  Constitutional: Positive for fatigue. Negative for activity change, appetite change and unexpected weight change.  Respiratory: Positive for cough. Negative for chest tightness, shortness of breath and wheezing.   Cardiovascular: Negative for chest pain and palpitations.  Psychiatric/Behavioral: Negative for behavioral problems and dysphoric mood. The patient is not nervous/anxious.     Current Outpatient Prescriptions on File Prior to Visit  Medication Sig Dispense Refill  . acetaminophen (TYLENOL) 650 MG CR tablet Take 1,300 mg by mouth at bedtime.    Marland Kitchen alendronate (FOSAMAX) 70 MG tablet Take 1 tablet (70 mg total) by mouth once a week. Take with a full glass of water on an empty stomach. 4 tablet 11  . allopurinol (ZYLOPRIM) 100 MG tablet TAKE 1 TABLET BY MOUTH AT BEDTIME 90 tablet 0  . ALPRAZolam (XANAX) 0.25 MG tablet Take 0.25 mg by mouth as needed for anxiety.    Marland Kitchen amLODipine (NORVASC) 5 MG tablet TAKE 1 TABLET BY MOUTH DAILY FOR HYPEFRTENSION 90 tablet 1  . aspirin 81 MG tablet Take 81 mg by mouth daily.    . beta carotene w/minerals (OCUVITE) tablet Take 1 tablet by mouth every morning.    . Cholecalciferol 4000 UNITS CAPS Take 1 capsule by mouth every morning. Vitamin D 3    . diphenhydrAMINE (SIMPLY SLEEP) 25 MG tablet Take 25 mg by mouth at bedtime as needed for sleep.    Marland Kitchen docusate sodium (COLACE) 100 MG capsule Take  100 mg by mouth every morning.    . estrogens, conjugated, ([name]) 0.625 MG tablet Take 1 tablet (0.625 mg total) by mouth daily. Take daily for 21 days then do not take for 7 days. 90 tablet 0  . ferrous sulfate 325 (65 FE) MG tablet Take 325 mg by mouth 2 (two) times daily with a meal.     . LORazepam (ATIVAN) 0.5 MG tablet Take 0.5 mg by mouth as needed for anxiety.    . metoprolol succinate (TOPROL-XL) 25 MG 24 hr tablet Take 25 mg by mouth every morning.    . mirtazapine (REMERON SOL-TAB) 15 MG disintegrating tablet TAKE 1 TABLET BY MOUTH EVERY NIGHT AT BEDTIME 90 tablet 0  . Multiple Vitamins-Minerals (HM MULTIVITAMIN ADULT GUMMY) CHEW Chew 2 tablets by mouth every morning.    . nicotine (NICOTROL) 10 MG inhaler Inhale 1 cartridge (1 continuous puffing total) into the lungs as needed for smoking cessation. 42 each 3  . omeprazole (PRILOSEC) 20 MG capsule Take 1 capsule (20 mg total) by mouth 2 (two) times daily before a meal. 60 capsule 11  . ondansetron (ZOFRAN-ODT) 8 MG disintegrating tablet Take 8 mg by mouth as needed for nausea or vomiting.    Marland Kitchen PARoxetine (PAXIL) 10 MG tablet TAKE 1  TABLET BY MOUTH DAILY FOR DEPRESSION 90 tablet 1  . traZODone (DESYREL) 50 MG tablet Take 1 tablet (50 mg total) by mouth at bedtime. 30 tablet 2   No current facility-administered medications on file prior to visit.       Objective:    Physical Exam  Constitutional: She is oriented to person, place, and time. She appears well-developed and well-nourished.  HENT:  Head: Normocephalic and atraumatic.  Eyes: Conjunctivae and EOM are normal.  Neck: Normal range of motion. Neck supple. No JVD present. Carotid bruit is not present. No thyromegaly present.  Cardiovascular: Normal rate, regular rhythm and normal heart sounds.   No murmur heard. Pulmonary/Chest: Effort normal and breath sounds normal. No respiratory distress. She has no wheezes. She has no rales. She exhibits no tenderness.    Musculoskeletal: She exhibits no edema.  Neurological: She is alert and oriented to person, place, and time.  Psychiatric: She has a normal mood and affect.    BP 112/70 mmHg  Pulse 116  Temp(Src) 98.2 F (36.8 C) (Oral)  Wt 105 lb (47.628 kg)  SpO2 93% Wt Readings from Last 3 Encounters:  07/11/14 105 lb (47.628 kg)  06/28/14 104 lb (47.174 kg)  05/17/14 105 lb 9.6 oz (47.9 kg)     Lab Results  Component Value Date   WBC 12.8* 06/28/2014   HGB 11.8* 06/28/2014   HCT 36.3 06/28/2014   PLT 478.0* 06/28/2014   GLUCOSE 85 06/28/2014   CHOL 168 05/24/2014   TRIG 100.0 05/24/2014   HDL 85.30 05/24/2014   LDLDIRECT 73.2 10/21/2013   LDLCALC 63 05/24/2014   ALT 10 06/28/2014   AST 18 06/28/2014   NA 139 06/28/2014   K 4.0 06/28/2014   CL 100 06/28/2014   CREATININE 1.83* 06/28/2014   BUN 24* 06/28/2014   CO2 31 06/28/2014   TSH 3.95 05/24/2014       Assessment & Plan:   Problem List Items Addressed This Visit    None    Visit Diagnoses    Other seasonal allergic rhinitis    -  Primary    Relevant Medications    Azelastine-Fluticasone (DYMISTA) 137-50 MCG/ACT SUSP       I have discontinued Ms. Asmar's levofloxacin. I am also having her start on Azelastine-Fluticasone. Additionally, I am having her maintain her metoprolol succinate, ferrous sulfate, docusate sodium, beta carotene w/minerals, aspirin, Cholecalciferol, HM MULTIVITAMIN ADULT GUMMY, acetaminophen, diphenhydrAMINE, ondansetron, ALPRAZolam, LORazepam, omeprazole, estrogens (conjugated), allopurinol, nicotine, traZODone, mirtazapine, alendronate, amLODipine, and PARoxetine.  Meds ordered this encounter  Medications  . Azelastine-Fluticasone (DYMISTA) 137-50 MCG/ACT SUSP    Sig: Place 1 spray into both nostrils 2 (two) times daily.    Dispense:  23 g    Refill:  [place], DO

## 2014-07-11 NOTE — Progress Notes (Signed)
Pre visit review using our clinic review tool, if applicable. No additional management support is needed unless otherwise documented below in the visit note. 

## 2014-07-11 NOTE — Patient Instructions (Signed)

## 2014-07-11 NOTE — Assessment & Plan Note (Signed)
Much improved dymista for pnd and runny nose rto prn

## 2014-07-25 ENCOUNTER — Other Ambulatory Visit: Payer: Self-pay | Admitting: Family Medicine

## 2014-08-08 ENCOUNTER — Other Ambulatory Visit: Payer: Self-pay | Admitting: Family Medicine

## 2014-08-09 NOTE — Telephone Encounter (Signed)
BMP 06/28/14 elevated kidney functions. Please advise      KP

## 2014-08-11 ENCOUNTER — Ambulatory Visit (HOSPITAL_BASED_OUTPATIENT_CLINIC_OR_DEPARTMENT_OTHER)
Admission: RE | Admit: 2014-08-11 | Discharge: 2014-08-11 | Disposition: A | Discharge status: Home / Self Care | Payer: Medicare Other | Source: Ambulatory Visit | Attending: Family Medicine | Admitting: Family Medicine

## 2014-08-11 ENCOUNTER — Encounter: Payer: Self-pay | Admitting: Family Medicine

## 2014-08-11 ENCOUNTER — Ambulatory Visit (INDEPENDENT_AMBULATORY_CARE_PROVIDER_SITE_OTHER): Payer: Medicare Other | Admitting: Family Medicine

## 2014-08-11 VITALS — BP 122/74 | HR 109 | Temp 98.1°F | Wt 102.0 lb

## 2014-08-11 DIAGNOSIS — R05 Cough: Secondary | ICD-10-CM

## 2014-08-11 DIAGNOSIS — R059 Cough, unspecified: Secondary | ICD-10-CM

## 2014-08-11 DIAGNOSIS — J449 Chronic obstructive pulmonary disease, unspecified: Secondary | ICD-10-CM | POA: Diagnosis not present

## 2014-08-11 DIAGNOSIS — J189 Pneumonia, unspecified organism: Secondary | ICD-10-CM | POA: Diagnosis not present

## 2014-08-11 DIAGNOSIS — R509 Fever, unspecified: Secondary | ICD-10-CM | POA: Diagnosis not present

## 2014-08-11 MED ORDER — LEVOFLOXACIN 500 MG PO TABS: 500.0000 mg | ORAL_TABLET | Freq: Every day | ORAL | Status: DC

## 2014-08-11 NOTE — Patient Instructions (Signed)

## 2014-08-11 NOTE — Progress Notes (Signed)
Pre visit review using our clinic review tool, if applicable. No additional management support is needed unless otherwise documented below in the visit note. 

## 2014-08-11 NOTE — Progress Notes (Signed)
Patient ID: Marissa Cruz, female    DOB: 1934-11-29  Age: 80 y.o. MRN: [id number]    Subjective:  Subjective HPI Marissa Cruz presents for cough and productive since Saturday.  No fever.  No chest pain.  Review of Systems  Constitutional: Negative for fever and chills.  HENT: Positive for congestion. Negative for postnasal drip, rhinorrhea and sinus pressure.   Respiratory: Positive for cough, chest tightness, shortness of breath and wheezing.   Cardiovascular: Negative for chest pain, palpitations and leg swelling.  Allergic/Immunologic: Negative for environmental allergies.  Psychiatric/Behavioral: Negative for decreased concentration. The patient is not nervous/anxious.     History Past Medical History  Diagnosis Date  . Arthritis     Thumbs  . Throat cancer     Vocal cord Cancer  . Chicken pox   . Depression   . Diverticulitis   . GERD (gastroesophageal reflux disease)   . Hypertension   . Kidney disease   . Colon polyp   . Stroke     Mini Stroke  . Carotid artery disease   . Oral-mouth cancer     She has past surgical history that includes Appendectomy; Tonsillectomy and adenoidectomy; Vaginal hysterectomy; Total hip arthroplasty (Left); Carotid endarterectomy; Colonoscopy (N/A, 01/31/2014); and mouth cancer.   Her family history includes Hypertension in her father.She reports that she has been smoking Cigarettes.  She has been smoking about 0.50 packs per day. She has never used smokeless tobacco. She reports that she drinks alcohol. She reports that she does not use illicit drugs.  Current Outpatient Prescriptions on File Prior to Visit  Medication Sig Dispense Refill  . acetaminophen (TYLENOL) 650 MG CR tablet Take 1,300 mg by mouth at bedtime.    Marland Kitchen alendronate (FOSAMAX) 70 MG tablet Take 1 tablet (70 mg total) by mouth once a week. Take with a full glass of water on an empty stomach. 4 tablet 11  . allopurinol (ZYLOPRIM) 100 MG tablet TAKE 1 TABLET BY MOUTH AT  BEDTIME 90 tablet 0  . allopurinol (ZYLOPRIM) 100 MG tablet TAKE 1 TABLET BY MOUTH AT BEDTIME 90 tablet 0  . ALPRAZolam (XANAX) 0.25 MG tablet Take 0.25 mg by mouth as needed for anxiety.    Marland Kitchen amLODipine (NORVASC) 5 MG tablet TAKE 1 TABLET BY MOUTH DAILY FOR HYPEFRTENSION 90 tablet 1  . aspirin 81 MG tablet Take 81 mg by mouth daily.    . Azelastine-Fluticasone (DYMISTA) 137-50 MCG/ACT SUSP Place 1 spray into both nostrils 2 (two) times daily. 23 g 3  . beta carotene w/minerals (OCUVITE) tablet Take 1 tablet by mouth every morning.    . Cholecalciferol 4000 UNITS CAPS Take 1 capsule by mouth every morning. Vitamin D 3    . diphenhydrAMINE (SIMPLY SLEEP) 25 MG tablet Take 25 mg by mouth at bedtime as needed for sleep.    Marland Kitchen docusate sodium (COLACE) 100 MG capsule Take 100 mg by mouth every morning.    . ferrous sulfate 325 (65 FE) MG tablet Take 325 mg by mouth 2 (two) times daily with a meal.     . LORazepam (ATIVAN) 0.5 MG tablet Take 0.5 mg by mouth as needed for anxiety.    . metoprolol succinate (TOPROL-XL) 25 MG 24 hr tablet Take 25 mg by mouth every morning.    . mirtazapine (REMERON SOL-TAB) 15 MG disintegrating tablet TAKE 1 TABLET BY MOUTH EVERY NIGHT AT BEDTIME 90 tablet 0  . Multiple Vitamins-Minerals (HM MULTIVITAMIN ADULT GUMMY) CHEW Chew 2 tablets by mouth  every morning.    . nicotine (NICOTROL) 10 MG inhaler Inhale 1 cartridge (1 continuous puffing total) into the lungs as needed for smoking cessation. 42 each 3  . omeprazole (PRILOSEC) 20 MG capsule Take 1 capsule (20 mg total) by mouth 2 (two) times daily before a meal. 60 capsule 11  . ondansetron (ZOFRAN-ODT) 8 MG disintegrating tablet Take 8 mg by mouth as needed for nausea or vomiting.    Marland Kitchen PARoxetine (PAXIL) 10 MG tablet TAKE 1 TABLET BY MOUTH DAILY FOR DEPRESSION 90 tablet 1  . [name] 0.625 MG tablet TAKE 1 TABLET BY MOUTH DAILY FOR 21 DAYS THEN DO NOT TAKE FOR 7 DAYS 90 tablet 0  . traZODone (DESYREL) 50 MG tablet Take 1  tablet (50 mg total) by mouth at bedtime. 30 tablet 2   No current facility-administered medications on file prior to visit.     Objective:  Objective Physical Exam  Constitutional: She is oriented to person, place, and time. She appears well-developed and well-nourished.  HENT:  Right Ear: External ear normal.  Left Ear: External ear normal.  + PND + errythema  Eyes: Conjunctivae are normal. Right eye exhibits no discharge. Left eye exhibits no discharge.  Neck: Normal range of motion. Neck supple.  Cardiovascular: Normal rate, regular rhythm and normal heart sounds.   No murmur heard. Pulmonary/Chest: She is in respiratory distress. She has no wheezes. She has rales. She exhibits no tenderness.  Musculoskeletal: She exhibits no edema.  Lymphadenopathy:    She has cervical adenopathy.  Neurological: She is alert and oriented to person, place, and time.  Psychiatric: She has a normal mood and affect. Her behavior is normal. Judgment and thought content normal.   BP 122/74 mmHg  Pulse 109  Temp(Src) 98.1 F (36.7 C) (Oral)  Wt 102 lb (46.267 kg)  SpO2 92% Wt Readings from Last 3 Encounters:  08/11/14 102 lb (46.267 kg)  07/11/14 105 lb (47.628 kg)  06/28/14 104 lb (47.174 kg)     Lab Results  Component Value Date   WBC 12.8* 06/28/2014   HGB 11.8* 06/28/2014   HCT 36.3 06/28/2014   PLT 478.0* 06/28/2014   GLUCOSE 85 06/28/2014   CHOL 168 05/24/2014   TRIG 100.0 05/24/2014   HDL 85.30 05/24/2014   LDLDIRECT 73.2 10/21/2013   LDLCALC 63 05/24/2014   ALT 10 06/28/2014   AST 18 06/28/2014   NA 139 06/28/2014   K 4.0 06/28/2014   CL 100 06/28/2014   CREATININE 1.83* 06/28/2014   BUN 24* 06/28/2014   CO2 31 06/28/2014   TSH 3.95 05/24/2014    Dg Chest 2 View  08/11/2014   CLINICAL DATA:  Productive cough and fever for the past 4 days, history of tobacco use, throat malignancy, and CVA.  EXAM: CHEST  2 VIEW  COMPARISON:  PA and lateral chest of June 28, 2014.   FINDINGS: The lungs are mildly hyperinflated. The right infrahilar lung markings are increased. There is small amount of apical pleural thickening bilaterally. The heart and pulmonary vascularity are normal. The mediastinum is normal in width. There is no pleural effusion. The bony thorax exhibits no acute abnormality.  IMPRESSION: COPD with superimposed right lower lobe atelectasis or early pneumonia.  Followup PA and lateral chest X-ray is recommended in 3-4 weeks following trial of antibiotic therapy to ensure resolution and exclude underlying malignancy.   Electronically Signed   By: David  Martinique M.D.   On: 08/11/2014 14:23     Assessment &  Plan:  Plan I am having Ms. Badolato start on levofloxacin. I am also having her maintain her metoprolol succinate, ferrous sulfate, docusate sodium, beta carotene w/minerals, aspirin, Cholecalciferol, HM MULTIVITAMIN ADULT GUMMY, acetaminophen, diphenhydrAMINE, ondansetron, ALPRAZolam, LORazepam, omeprazole, nicotine, traZODone, mirtazapine, alendronate, amLODipine, PARoxetine, Azelastine-Fluticasone, [name], allopurinol, and allopurinol.  Meds ordered this encounter  Medications  . levofloxacin (LEVAQUIN) 500 MG tablet    Sig: Take 1 tablet (500 mg total) by mouth daily.    Dispense:  10 tablet    Refill:  0    Problem List Items Addressed This Visit    None    Visit Diagnoses    CAP (community acquired pneumonia)    -  Primary    Relevant Medications    levofloxacin (LEVAQUIN) 500 MG tablet    Cough        Relevant Orders    DG Chest 2 View (Completed)       Follow-up: Return in about 2 weeks (around 08/25/2014), or if symptoms worsen or fail to improve, for f;u pneumonia.  Garnet Koyanagi, DO

## 2014-08-18 ENCOUNTER — Ambulatory Visit (INDEPENDENT_AMBULATORY_CARE_PROVIDER_SITE_OTHER): Payer: Medicare Other | Admitting: Family Medicine

## 2014-08-18 ENCOUNTER — Encounter: Payer: Self-pay | Admitting: Family Medicine

## 2014-08-18 VITALS — BP 123/72 | HR 115 | Temp 98.7°F | Resp 18 | Ht 65.0 in | Wt 100.2 lb

## 2014-08-18 DIAGNOSIS — J189 Pneumonia, unspecified organism: Secondary | ICD-10-CM

## 2014-08-18 MED ORDER — AMOXICILLIN-POT CLAVULANATE 875-125 MG PO TABS: 1.0000 | ORAL_TABLET | Freq: Two times a day (BID) | ORAL | Status: DC

## 2014-08-18 MED ORDER — IPRATROPIUM-ALBUTEROL 0.5-2.5 (3) MG/3ML IN SOLN
3.0000 mL | Freq: Once | RESPIRATORY_TRACT | Status: AC
  Administered 2014-08-18: 3 mL via RESPIRATORY_TRACT

## 2014-08-18 MED ORDER — CLARITHROMYCIN ER 500 MG PO TB24: 1000.0000 mg | ORAL_TABLET | Freq: Every day | ORAL | Status: DC

## 2014-08-18 MED ORDER — CEFTRIAXONE SODIUM 1 G IJ SOLR
1.0000 g | Freq: Once | INTRAMUSCULAR | Status: AC
  Administered 2014-08-18: 1 g via INTRAMUSCULAR

## 2014-08-18 NOTE — Patient Instructions (Signed)

## 2014-08-18 NOTE — Progress Notes (Signed)
Pre visit review using our clinic review tool, if applicable. No additional management support is needed unless otherwise documented below in the visit note. 

## 2014-08-19 ENCOUNTER — Encounter (HOSPITAL_BASED_OUTPATIENT_CLINIC_OR_DEPARTMENT_OTHER): Payer: Self-pay

## 2014-08-19 ENCOUNTER — Telehealth: Payer: Self-pay | Admitting: Family Medicine

## 2014-08-19 ENCOUNTER — Inpatient Hospital Stay (HOSPITAL_BASED_OUTPATIENT_CLINIC_OR_DEPARTMENT_OTHER)
Admission: EM | Admit: 2014-08-19 | Discharge: 2014-08-22 | DRG: 194 | Disposition: A | Discharge status: Home / Self Care | Payer: Medicare Other | Attending: Internal Medicine | Admitting: Internal Medicine

## 2014-08-19 ENCOUNTER — Emergency Department (HOSPITAL_BASED_OUTPATIENT_CLINIC_OR_DEPARTMENT_OTHER): Payer: Medicare Other

## 2014-08-19 DIAGNOSIS — I129 Hypertensive chronic kidney disease with stage 1 through stage 4 chronic kidney disease, or unspecified chronic kidney disease: Secondary | ICD-10-CM | POA: Diagnosis present

## 2014-08-19 DIAGNOSIS — Z923 Personal history of irradiation: Secondary | ICD-10-CM

## 2014-08-19 DIAGNOSIS — M109 Gout, unspecified: Secondary | ICD-10-CM | POA: Diagnosis present

## 2014-08-19 DIAGNOSIS — F17210 Nicotine dependence, cigarettes, uncomplicated: Secondary | ICD-10-CM | POA: Diagnosis not present

## 2014-08-19 DIAGNOSIS — N184 Chronic kidney disease, stage 4 (severe): Secondary | ICD-10-CM | POA: Diagnosis present

## 2014-08-19 DIAGNOSIS — E440 Moderate protein-calorie malnutrition: Secondary | ICD-10-CM | POA: Diagnosis present

## 2014-08-19 DIAGNOSIS — M1990 Unspecified osteoarthritis, unspecified site: Secondary | ICD-10-CM | POA: Diagnosis present

## 2014-08-19 DIAGNOSIS — Z96642 Presence of left artificial hip joint: Secondary | ICD-10-CM | POA: Diagnosis present

## 2014-08-19 DIAGNOSIS — Z85819 Personal history of malignant neoplasm of unspecified site of lip, oral cavity, and pharynx: Secondary | ICD-10-CM | POA: Diagnosis not present

## 2014-08-19 DIAGNOSIS — J189 Pneumonia, unspecified organism: Secondary | ICD-10-CM | POA: Diagnosis not present

## 2014-08-19 DIAGNOSIS — Z8521 Personal history of malignant neoplasm of larynx: Secondary | ICD-10-CM

## 2014-08-19 DIAGNOSIS — K219 Gastro-esophageal reflux disease without esophagitis: Secondary | ICD-10-CM | POA: Diagnosis present

## 2014-08-19 DIAGNOSIS — R059 Cough, unspecified: Secondary | ICD-10-CM

## 2014-08-19 DIAGNOSIS — L8990 Pressure ulcer of unspecified site, unspecified stage: Secondary | ICD-10-CM | POA: Insufficient documentation

## 2014-08-19 DIAGNOSIS — E46 Unspecified protein-calorie malnutrition: Secondary | ICD-10-CM | POA: Insufficient documentation

## 2014-08-19 DIAGNOSIS — Z681 Body mass index (BMI) 19 or less, adult: Secondary | ICD-10-CM

## 2014-08-19 DIAGNOSIS — Z66 Do not resuscitate: Secondary | ICD-10-CM | POA: Diagnosis present

## 2014-08-19 DIAGNOSIS — Z720 Tobacco use: Secondary | ICD-10-CM

## 2014-08-19 DIAGNOSIS — F17200 Nicotine dependence, unspecified, uncomplicated: Secondary | ICD-10-CM | POA: Diagnosis present

## 2014-08-19 DIAGNOSIS — Z8249 Family history of ischemic heart disease and other diseases of the circulatory system: Secondary | ICD-10-CM | POA: Diagnosis not present

## 2014-08-19 DIAGNOSIS — Z8673 Personal history of transient ischemic attack (TIA), and cerebral infarction without residual deficits: Secondary | ICD-10-CM | POA: Diagnosis not present

## 2014-08-19 DIAGNOSIS — F411 Generalized anxiety disorder: Secondary | ICD-10-CM | POA: Diagnosis not present

## 2014-08-19 DIAGNOSIS — L8991 Pressure ulcer of unspecified site, stage 1: Secondary | ICD-10-CM | POA: Diagnosis present

## 2014-08-19 DIAGNOSIS — I10 Essential (primary) hypertension: Secondary | ICD-10-CM | POA: Diagnosis not present

## 2014-08-19 DIAGNOSIS — R05 Cough: Secondary | ICD-10-CM | POA: Diagnosis not present

## 2014-08-19 HISTORY — DX: Malignant neoplasm of glottis: C32.0

## 2014-08-19 HISTORY — DX: Nonexudative age-related macular degeneration, right eye, stage unspecified: H35.3110

## 2014-08-19 HISTORY — DX: Personal history of other diseases of the musculoskeletal system and connective tissue: Z87.39

## 2014-08-19 HISTORY — DX: Pneumonia, unspecified organism: J18.9

## 2014-08-19 HISTORY — DX: Other chronic pain: G89.29

## 2014-08-19 HISTORY — DX: Exudative age-related macular degeneration, left eye, stage unspecified: H35.3220

## 2014-08-19 HISTORY — DX: Anemia, unspecified: D64.9

## 2014-08-19 HISTORY — DX: Low back pain: M54.5

## 2014-08-19 HISTORY — DX: Low back pain, unspecified: M54.50

## 2014-08-19 HISTORY — DX: Anxiety disorder, unspecified: F41.9

## 2014-08-19 HISTORY — DX: Transient cerebral ischemic attack, unspecified: G45.9

## 2014-08-19 HISTORY — DX: Personal history of other medical treatment: Z92.89

## 2014-08-19 LAB — CBC
HCT: 32.1 % — ABNORMAL LOW (ref 36.0–46.0)
HEMOGLOBIN: 10.5 g/dL — AB (ref 12.0–15.0)
MCH: 31.6 pg (ref 26.0–34.0)
MCHC: 32.7 g/dL (ref 30.0–36.0)
MCV: 96.7 fL (ref 78.0–100.0)
Platelets: 314 10*3/uL (ref 150–400)
RBC: 3.32 MIL/uL — AB (ref 3.87–5.11)
RDW: 14.6 % (ref 11.5–15.5)
WBC: 12.6 10*3/uL — ABNORMAL HIGH (ref 4.0–10.5)

## 2014-08-19 LAB — CBC WITH DIFFERENTIAL/PLATELET
BASOS PCT: 1 % (ref 0–1)
Basophils Absolute: 0 10*3/uL (ref 0.0–0.1)
Basophils Absolute: 0.2 10*3/uL — ABNORMAL HIGH (ref 0.0–0.1)
Basophils Relative: 0.2 % (ref 0.0–3.0)
Eosinophils Absolute: 0 10*3/uL (ref 0.0–0.7)
Eosinophils Absolute: 0 10*3/uL (ref 0.0–0.7)
Eosinophils Relative: 0.2 % (ref 0.0–5.0)
Eosinophils Relative: 0 % (ref 0–5)
HCT: 36.3 % (ref 36.0–46.0)
HCT: 32.2 % — ABNORMAL LOW (ref 36.0–46.0)
HEMOGLOBIN: 11.5 g/dL — AB (ref 12.0–15.0)
Hemoglobin: 10.2 g/dL — ABNORMAL LOW (ref 12.0–15.0)
LYMPHS PCT: 2 % — AB (ref 12–46)
Lymphocytes Relative: 11 % — ABNORMAL LOW (ref 12.0–46.0)
Lymphs Abs: 1.7 10*3/uL (ref 0.7–4.0)
Lymphs Abs: 0.3 10*3/uL — ABNORMAL LOW (ref 0.7–4.0)
MCH: 30.4 pg (ref 26.0–34.0)
MCHC: 31.7 g/dL (ref 30.0–36.0)
MCHC: 31.7 g/dL (ref 30.0–36.0)
MCV: 97.9 fl (ref 78.0–100.0)
MCV: 95.8 fL (ref 78.0–100.0)
MONO ABS: 1.4 10*3/uL — AB (ref 0.1–1.0)
MONO ABS: 0.3 10*3/uL (ref 0.1–1.0)
Monocytes Relative: 8.8 % (ref 3.0–12.0)
Monocytes Relative: 2 % — ABNORMAL LOW (ref 3–12)
NEUTROS ABS: 12.4 10*3/uL — AB (ref 1.4–7.7)
NEUTROS PCT: 95 % — AB (ref 43–77)
Neutro Abs: 15.2 10*3/uL — ABNORMAL HIGH (ref 1.7–7.7)
Neutrophils Relative %: 79.8 % — ABNORMAL HIGH (ref 43.0–77.0)
PLATELETS: 243 10*3/uL (ref 150–400)
Platelets: 350 10*3/uL (ref 150.0–400.0)
RBC: 3.71 Mil/uL — ABNORMAL LOW (ref 3.87–5.11)
RBC: 3.36 MIL/uL — ABNORMAL LOW (ref 3.87–5.11)
RDW: 15.6 % — ABNORMAL HIGH (ref 11.5–15.5)
RDW: 14.3 % (ref 11.5–15.5)
WBC: 15.6 10*3/uL — ABNORMAL HIGH (ref 4.0–10.5)
WBC: 16 10*3/uL — ABNORMAL HIGH (ref 4.0–10.5)

## 2014-08-19 LAB — BASIC METABOLIC PANEL
BUN: 19 mg/dL (ref 6–23)
CO2: 25 mEq/L (ref 19–32)
CREATININE: 1.54 mg/dL — AB (ref 0.40–1.20)
Calcium: 8.9 mg/dL (ref 8.4–10.5)
Chloride: 95 mEq/L — ABNORMAL LOW (ref 96–112)
GFR: 34.65 mL/min — ABNORMAL LOW (ref >60.00)
Glucose, Bld: 73 mg/dL (ref 70–99)
Potassium: 4.4 mEq/L (ref 3.5–5.1)
Sodium: 132 mEq/L — ABNORMAL LOW (ref 135–145)

## 2014-08-19 LAB — COMPREHENSIVE METABOLIC PANEL
ALT: 9 U/L — ABNORMAL LOW (ref 14–54)
ANION GAP: 14 (ref 5–15)
AST: 20 U/L (ref 15–41)
Albumin: 3.1 g/dL — ABNORMAL LOW (ref 3.5–5.0)
Alkaline Phosphatase: 72 U/L (ref 38–126)
BILIRUBIN TOTAL: 0.5 mg/dL (ref 0.3–1.2)
BUN: 22 mg/dL — ABNORMAL HIGH (ref 6–20)
CHLORIDE: 95 mmol/L — AB (ref 101–111)
CO2: 24 mmol/L (ref 22–32)
Calcium: 8 mg/dL — ABNORMAL LOW (ref 8.9–10.3)
Creatinine, Ser: 1.64 mg/dL — ABNORMAL HIGH (ref 0.44–1.00)
GFR calc non Af Amer: 29 mL/min — ABNORMAL LOW (ref >60)
GFR, EST AFRICAN AMERICAN: 34 mL/min — AB (ref >60)
GLUCOSE: 116 mg/dL — AB (ref 65–99)
POTASSIUM: 4 mmol/L (ref 3.5–5.1)
Sodium: 133 mmol/L — ABNORMAL LOW (ref 135–145)
TOTAL PROTEIN: 6.3 g/dL — AB (ref 6.5–8.1)

## 2014-08-19 LAB — TROPONIN I: Troponin I: 0.04 ng/mL — ABNORMAL HIGH (ref <0.031)

## 2014-08-19 LAB — CREATININE, SERUM
CREATININE: 1.63 mg/dL — AB (ref 0.44–1.00)
GFR calc non Af Amer: 29 mL/min — ABNORMAL LOW (ref >60)
GFR, EST AFRICAN AMERICAN: 34 mL/min — AB (ref >60)

## 2014-08-19 LAB — I-STAT CG4 LACTIC ACID, ED: LACTIC ACID, VENOUS: 1.37 mmol/L (ref 0.5–2.0)

## 2014-08-19 MED ORDER — PIPERACILLIN-TAZOBACTAM 3.375 G IVPB 30 MIN
3.3750 g | Freq: Once | INTRAVENOUS | Status: AC
  Administered 2014-08-19: 3.375 g via INTRAVENOUS
  Filled 2014-08-19 (×2): qty 50

## 2014-08-19 MED ORDER — CEFTRIAXONE SODIUM IN DEXTROSE 20 MG/ML IV SOLN
1.0000 g | INTRAVENOUS | Status: DC
  Administered 2014-08-19 – 2014-08-21 (×3): 1 g via INTRAVENOUS
  Filled 2014-08-19 (×4): qty 50

## 2014-08-19 MED ORDER — METOPROLOL SUCCINATE ER 25 MG PO TB24
25.0000 mg | ORAL_TABLET | Freq: Every morning | ORAL | Status: DC
  Administered 2014-08-20 – 2014-08-22 (×3): 25 mg via ORAL
  Filled 2014-08-19 (×3): qty 1

## 2014-08-19 MED ORDER — PANTOPRAZOLE SODIUM 40 MG PO TBEC
40.0000 mg | DELAYED_RELEASE_TABLET | Freq: Every day | ORAL | Status: DC
  Administered 2014-08-19 – 2014-08-22 (×4): 40 mg via ORAL
  Filled 2014-08-19 (×4): qty 1

## 2014-08-19 MED ORDER — SODIUM CHLORIDE 0.9 % IV SOLN
INTRAVENOUS | Status: AC
  Administered 2014-08-19: 14:00:00 via INTRAVENOUS

## 2014-08-19 MED ORDER — OCUVITE PO TABS
1.0000 | ORAL_TABLET | Freq: Every morning | ORAL | Status: DC
  Administered 2014-08-20 – 2014-08-22 (×3): 1 via ORAL
  Filled 2014-08-19 (×3): qty 1

## 2014-08-19 MED ORDER — MIRTAZAPINE 15 MG PO TBDP
15.0000 mg | ORAL_TABLET | Freq: Every day | ORAL | Status: DC
  Administered 2014-08-19 – 2014-08-21 (×3): 15 mg via ORAL
  Filled 2014-08-19 (×4): qty 1

## 2014-08-19 MED ORDER — ACETAMINOPHEN 325 MG PO TABS
650.0000 mg | ORAL_TABLET | Freq: Once | ORAL | Status: AC
  Administered 2014-08-19: 650 mg via ORAL
  Filled 2014-08-19: qty 2

## 2014-08-19 MED ORDER — ENSURE ENLIVE PO LIQD: 237.0000 mL | Freq: Two times a day (BID) | ORAL | Status: DC

## 2014-08-19 MED ORDER — SODIUM CHLORIDE 0.9 % IV BOLUS (SEPSIS)
500.0000 mL | Freq: Once | INTRAVENOUS | Status: AC
  Administered 2014-08-19: 500 mL via INTRAVENOUS

## 2014-08-19 MED ORDER — SODIUM CHLORIDE 0.9 % IJ SOLN: 3.0000 mL | INTRAMUSCULAR | Status: DC | PRN

## 2014-08-19 MED ORDER — ACETAMINOPHEN 500 MG PO TABS
1000.0000 mg | ORAL_TABLET | Freq: Every day | ORAL | Status: DC
  Administered 2014-08-19 – 2014-08-21 (×3): 1000 mg via ORAL
  Filled 2014-08-19 (×5): qty 2

## 2014-08-19 MED ORDER — PREDNISONE 50 MG PO TABS
50.0000 mg | ORAL_TABLET | Freq: Every day | ORAL | Status: DC
  Administered 2014-08-19 – 2014-08-22 (×4): 50 mg via ORAL
  Filled 2014-08-19 (×5): qty 1

## 2014-08-19 MED ORDER — DOCUSATE SODIUM 100 MG PO CAPS
100.0000 mg | ORAL_CAPSULE | Freq: Every morning | ORAL | Status: DC
  Administered 2014-08-20 – 2014-08-21 (×2): 100 mg via ORAL
  Filled 2014-08-19 (×3): qty 1

## 2014-08-19 MED ORDER — NICOTINE 21 MG/24HR TD PT24
21.0000 mg | MEDICATED_PATCH | Freq: Every day | TRANSDERMAL | Status: DC
Start: 2014-08-19 — End: 2014-08-22
  Administered 2014-08-19 – 2014-08-22 (×4): 21 mg via TRANSDERMAL
  Filled 2014-08-19 (×4): qty 1

## 2014-08-19 MED ORDER — DEXTROSE 5 % IV SOLN
500.0000 mg | INTRAVENOUS | Status: DC
  Administered 2014-08-19 – 2014-08-21 (×3): 500 mg via INTRAVENOUS
  Filled 2014-08-19 (×4): qty 500

## 2014-08-19 MED ORDER — FERROUS SULFATE 325 (65 FE) MG PO TABS
325.0000 mg | ORAL_TABLET | Freq: Two times a day (BID) | ORAL | Status: DC
  Administered 2014-08-19 – 2014-08-22 (×6): 325 mg via ORAL
  Filled 2014-08-19 (×8): qty 1

## 2014-08-19 MED ORDER — SODIUM CHLORIDE 0.9 % IV SOLN: 250.0000 mL | INTRAVENOUS | Status: DC | PRN

## 2014-08-19 MED ORDER — ALPRAZOLAM 0.25 MG PO TABS: 0.2500 mg | ORAL_TABLET | ORAL | Status: DC | PRN

## 2014-08-19 MED ORDER — ONDANSETRON HCL 4 MG/2ML IJ SOLN
4.0000 mg | Freq: Once | INTRAMUSCULAR | Status: AC
  Administered 2014-08-19: 4 mg via INTRAVENOUS
  Filled 2014-08-19: qty 2

## 2014-08-19 MED ORDER — PAROXETINE HCL 10 MG PO TABS
10.0000 mg | ORAL_TABLET | Freq: Every day | ORAL | Status: DC
  Administered 2014-08-19 – 2014-08-22 (×4): 10 mg via ORAL
  Filled 2014-08-19 (×4): qty 1

## 2014-08-19 MED ORDER — ALBUTEROL SULFATE (2.5 MG/3ML) 0.083% IN NEBU
2.5000 mg | INHALATION_SOLUTION | RESPIRATORY_TRACT | Status: DC | PRN
  Filled 2014-08-19: qty 3

## 2014-08-19 MED ORDER — ALLOPURINOL 100 MG PO TABS
100.0000 mg | ORAL_TABLET | Freq: Every day | ORAL | Status: DC
  Administered 2014-08-19 – 2014-08-21 (×3): 100 mg via ORAL
  Filled 2014-08-19 (×4): qty 1

## 2014-08-19 MED ORDER — ENOXAPARIN SODIUM 30 MG/0.3ML ~~LOC~~ SOLN
30.0000 mg | SUBCUTANEOUS | Status: DC
  Administered 2014-08-19 – 2014-08-21 (×3): 30 mg via SUBCUTANEOUS
  Filled 2014-08-19 (×4): qty 0.3

## 2014-08-19 MED ORDER — POLYETHYLENE GLYCOL 3350 17 G PO PACK
17.0000 g | PACK | Freq: Once | ORAL | Status: AC
  Administered 2014-08-19: 17 g via ORAL
  Filled 2014-08-19: qty 1

## 2014-08-19 MED ORDER — ASPIRIN EC 81 MG PO TBEC
81.0000 mg | DELAYED_RELEASE_TABLET | Freq: Every day | ORAL | Status: DC
  Administered 2014-08-19 – 2014-08-22 (×4): 81 mg via ORAL
  Filled 2014-08-19 (×4): qty 1

## 2014-08-19 MED ORDER — SODIUM CHLORIDE 0.9 % IJ SOLN
3.0000 mL | Freq: Two times a day (BID) | INTRAMUSCULAR | Status: DC
  Administered 2014-08-19 – 2014-08-22 (×6): 3 mL via INTRAVENOUS

## 2014-08-19 MED ORDER — GUAIFENESIN-DM 100-10 MG/5ML PO SYRP
5.0000 mL | ORAL_SOLUTION | ORAL | Status: DC | PRN
  Filled 2014-08-19: qty 5

## 2014-08-19 MED ORDER — AMLODIPINE BESYLATE 5 MG PO TABS
5.0000 mg | ORAL_TABLET | Freq: Every day | ORAL | Status: DC
  Administered 2014-08-19 – 2014-08-22 (×4): 5 mg via ORAL
  Filled 2014-08-19 (×4): qty 1

## 2014-08-19 MED ORDER — PIPERACILLIN-TAZOBACTAM 3.375 G IVPB
3.3750 g | Freq: Three times a day (TID) | INTRAVENOUS | Status: DC
  Filled 2014-08-19 (×2): qty 50

## 2014-08-19 MED ORDER — TRAZODONE HCL 50 MG PO TABS
50.0000 mg | ORAL_TABLET | Freq: Every day | ORAL | Status: DC
  Administered 2014-08-19 – 2014-08-21 (×3): 50 mg via ORAL
  Filled 2014-08-19 (×4): qty 1

## 2014-08-19 MED ORDER — ALBUTEROL SULFATE (2.5 MG/3ML) 0.083% IN NEBU
2.5000 mg | INHALATION_SOLUTION | Freq: Three times a day (TID) | RESPIRATORY_TRACT | Status: DC
  Filled 2014-08-19: qty 3

## 2014-08-19 MED ORDER — VANCOMYCIN HCL 500 MG IV SOLR
500.0000 mg | INTRAVENOUS | Status: DC
  Filled 2014-08-19: qty 500

## 2014-08-19 NOTE — Plan of Care (Signed)
Problem: Phase I Progression Outcomes Goal: OOB as tolerated unless otherwise ordered Outcome: Completed/Met Date Met:  08/19/14 [facility]SC

## 2014-08-19 NOTE — ED Notes (Signed)
Patient back from  X-ray 

## 2014-08-19 NOTE — ED Notes (Signed)
Patient transported to X-ray 

## 2014-08-19 NOTE — H&P (Addendum)
History and Physical  Marissa Cruz VOJ:[id number] DOB: January 21, 1935 DOA: 08/19/2014  Referring physician: Blanchie Dessert, ER physician PCP: Garnet Koyanagi, DO   Chief Complaint: Cough  HPI: Marissa Cruz is a 80 y.o. female  With past medical history of tobacco abuse, hypertension and stage III-4 chronic kidney disease who week ago was returning from [place] with her family and after they arrived home multiple family members including her had pneumonia. Prior to this she was in good health. Seen by her PCP and chest x-ray confirmed infiltrate. Patient was started on by mouth Levaquin and completed almost the whole course, but with no improvement.  Antibiotics were changed but after one day, she called the office notified she was having continued shortness of breath and persistent cough and advised to come to the emergency room. Patient to [facility]. Chest x-ray confirmed continued infiltrate. White blood cell count at 16. Rest of labs were unrevealing. Lactic acid level only at 1.37. Patient given dose of vancomycin and Zosyn the emergency room. Hospitalist call for further evaluation and admission after.   Review of Systems:  Patient seen after transfer from [facility]. Pt complains of persistent cough, sometimes nonproductive with whitish sputum. She does feel short of breath with some exertion. Also complains of wheeze. She also has noted some constipation for her which is new  Pt denies any headaches, vision changes, dysphagia, chest pain, palpitations, abdominal pain, hematuria, dysuria, diarrhea, focal extremity numbness or weakness or pain-other than baseline, she ambulates by wheelchair Review of systems are otherwise negative  Past Medical History  Diagnosis Date  . Chicken pox   . Depression   . Diverticulitis   . GERD (gastroesophageal reflux disease)   . Hypertension   . Kidney disease   . Colon polyp   . Carotid artery disease   . CAP (community  acquired pneumonia) 08/19/2014  . Vocal cord cancer ~ 2009    S/P radiation  . 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 Anemia   . History of blood transfusion 1976; 2013    "w/hysterectomy; hip OR"  . TIA (transient ischemic attack) ~ 2009 X 2  . Arthritis     "thumbs, hands" (08/18/2014)  . Chronic lower back pain   . History of gout   . Anxiety   . Age-related macular degeneration, wet, left eye   . Age-related macular degeneration, dry, right eye    Past Surgical History  Procedure Laterality Date  . Abdominal hysterectomy  1976  . Total hip arthroplasty Left 2013  . Carotid endarterectomy Left ~ 2014  . Colonoscopy N/A 01/31/2014    Procedure: COLONOSCOPY;  Surgeon: Ladene Artist, MD;  Location: [facility];  Service: Endoscopy;  Laterality: N/A;  . Mouth floor mass excision  03/2014    "removed cancer underneath my tongue"  . Tonsillectomy  1941  . Appendectomy  1954  . Joint replacement    . Dilation and curettage of uterus    . Cataract extraction w/ intraocular lens  implant, bilateral Bilateral ~ 2009   Social History:  reports that she has been smoking Cigarettes.  She has a 30 pack-year smoking history. She has never used smokeless tobacco. She reports that she drinks about 8.4 oz of alcohol per week. She reports that she does not use illicit drugs. Patient lives at home by herself with her daughter living just 2 miles away & is able to participate in activities of daily  living with use of a wheelchair  Allergies  Allergen Reactions  . Morphine And Related Other (See Comments)    Hallucinations and combative  . Codeine Nausea Only    Family History  Problem Relation Age of Onset  . Hypertension Father      Prior to Admission medications   Medication Sig Start Date End Date Taking? Authorizing Provider  acetaminophen (TYLENOL) 650 MG CR tablet Take 1,300 mg by mouth at bedtime.   Yes Historical Provider, MD  alendronate (FOSAMAX) 70  MG tablet Take 1 tablet (70 mg total) by mouth once a week. Take with a full glass of water on an empty stomach. 06/16/14  Yes Rosalita Chessman, DO  allopurinol (ZYLOPRIM) 100 MG tablet TAKE 1 TABLET BY MOUTH AT BEDTIME 08/09/14  Yes Yvonne R Lowne, DO  ALPRAZolam (XANAX) 0.25 MG tablet Take 0.25 mg by mouth as needed for anxiety.   Yes Historical Provider, MD  amLODipine (NORVASC) 5 MG tablet TAKE 1 TABLET BY MOUTH DAILY FOR HYPEFRTENSION 06/27/14  Yes Yvonne R Lowne, DO  amoxicillin-clavulanate (AUGMENTIN) 875-125 MG per tablet Take 1 tablet by mouth 2 (two) times daily. 08/18/14  Yes Rosalita Chessman, DO  aspirin 81 MG tablet Take 81 mg by mouth daily.   Yes Historical Provider, MD  Azelastine-Fluticasone (DYMISTA) 137-50 MCG/ACT SUSP Place 1 spray into both nostrils 2 (two) times daily. 07/11/14  Yes Alferd Apa Lowne, DO  beta carotene w/minerals (OCUVITE) tablet Take 1 tablet by mouth every morning.   Yes Historical Provider, MD  Cholecalciferol 4000 UNITS CAPS Take 1 capsule by mouth every morning. Vitamin D 3   Yes Historical Provider, MD  clarithromycin (BIAXIN XL) 500 MG 24 hr tablet Take 2 tablets (1,000 mg total) by mouth daily. 08/18/14  Yes Alferd Apa Lowne, DO  diphenhydrAMINE (SIMPLY SLEEP) 25 MG tablet Take 25 mg by mouth at bedtime as needed for sleep.   Yes Historical Provider, MD  docusate sodium (COLACE) 100 MG capsule Take 100 mg by mouth every morning.   Yes Historical Provider, MD  ferrous sulfate 325 (65 FE) MG tablet Take 325 mg by mouth 2 (two) times daily with a meal.    Yes Historical Provider, MD  LORazepam (ATIVAN) 0.5 MG tablet Take 0.5 mg by mouth as needed for anxiety.   Yes Historical Provider, MD  metoprolol succinate (TOPROL-XL) 25 MG 24 hr tablet Take 25 mg by mouth every morning.   Yes Historical Provider, MD  mirtazapine (REMERON SOL-TAB) 15 MG disintegrating tablet TAKE 1 TABLET BY MOUTH EVERY NIGHT AT BEDTIME 06/09/14  Yes Rosalita Chessman, DO  Multiple Vitamins-Minerals (HM  MULTIVITAMIN ADULT GUMMY) CHEW Chew 2 tablets by mouth every morning.   Yes Historical Provider, MD  omeprazole (PRILOSEC) 20 MG capsule Take 1 capsule (20 mg total) by mouth 2 (two) times daily before a meal. 04/04/14  Yes Yvonne R Lowne, DO  ondansetron (ZOFRAN-ODT) 8 MG disintegrating tablet Take 8 mg by mouth as needed for nausea or vomiting.   Yes Historical Provider, MD  PARoxetine (PAXIL) 10 MG tablet TAKE 1 TABLET BY MOUTH DAILY FOR DEPRESSION 06/27/14  Yes Yvonne R Lowne, DO  [name] 0.625 MG tablet TAKE 1 TABLET BY MOUTH DAILY FOR 21 DAYS THEN DO NOT TAKE FOR 7 DAYS 07/25/14  Yes Alferd Apa Lowne, DO  traZODone (DESYREL) 50 MG tablet Take 1 tablet (50 mg total) by mouth at bedtime. 05/30/14  Yes Yvonne R Lowne, DO  nicotine (NICOTROL) 10 MG  inhaler Inhale 1 cartridge (1 continuous puffing total) into the lungs as needed for smoking cessation. 05/17/14   Rosalita Chessman, DO    Physical Exam: BP 139/42 mmHg  Pulse 89  Temp(Src) 98.6 F (37 C) (Oral)  Resp 20  Ht '5\' 7"'$  (1.702 m)  Wt 46.267 kg (102 lb)  BMI 15.97 kg/m2  SpO2 96%  General:  Alert and oriented 3, no acute distress, emaciated Eyes: Sclera nonicteric, extraocular movements are intact ENT: Normocephalic, atraumatic, mucous members are slightly dry Neck: No JVD Cardiovascular: Regular rate and rhythm, S1-S2 Respiratory: Bilateral rales, with some wheezing. Right greater than left Abdomen: Soft, nontender, nondistended, positive bowel sounds Skin: No skin breaks, tears or lesions Musculoskeletal: No clubbing or cyanosis or edema Psychiatric: Patient is appropriate, no evidence of psychoses Neurologic: No focal deficits           Labs on Admission:  Basic Metabolic Panel:  Recent Labs Lab 08/18/14 1519 08/19/14 1130  NA 132* 133*  K 4.4 4.0  CL 95* 95*  CO2 25 24  GLUCOSE 73 116*  BUN 19 22*  CREATININE 1.54* 1.64*  CALCIUM 8.9 8.0*   Liver Function Tests:  Recent Labs Lab 08/19/14 1130  AST 20  ALT 9*    ALKPHOS 72  BILITOT 0.5  PROT 6.3*  ALBUMIN 3.1*   No results for input(s): LIPASE, AMYLASE in the last 168 hours. No results for input(s): AMMONIA in the last 168 hours. CBC:  Recent Labs Lab 08/18/14 1519 08/19/14 1130  WBC 15.6* 16.0*  NEUTROABS 12.4* 15.2*  HGB 11.5* 10.2*  HCT 36.3 32.2*  MCV 97.9 95.8  PLT 350.0 243   Cardiac Enzymes:  Recent Labs Lab 08/19/14 1140  TROPONINI 0.04*    BNP (last 3 results) No results for input(s): BNP in the last 8760 hours.  ProBNP (last 3 results) No results for input(s): PROBNP in the last 8760 hours.  CBG: No results for input(s): GLUCAP in the last 168 hours.  Radiological Exams on Admission: Dg Chest 2 View  08/19/2014   CLINICAL DATA:  Recent diagnosis of pneumonia with persistent cough and congestion  EXAM: CHEST - 2 VIEW  COMPARISON:  08/11/2014  FINDINGS: Cardiac shadow is stable. The lungs are well aerated bilaterally. Persistent right basilar changes are noted particularly on the frontal exam. No new focal infiltrate is seen. No bony abnormality is noted.  IMPRESSION: Persistent right basilar changes.   Electronically Signed   By: Inez Catalina M.D.   On: 08/19/2014 12:16    EKG: Independently reviewed. Normal sinus rhythm  Assessment/Plan Present on Admission:  . CAP (community acquired pneumonia): Outside of tobacco abuse, patient does not really have any additional risk factors for healthcare associated pneumonia. She is quite alert and oriented. Very functional. No issues with choking or dysphagia or aspiration. We'll treat her as community-acquired pneumonia with IV antibiotics. Because of wheezing have given prednisone plus nebulizers scheduled and when necessary. If persistent fever or breathing gets worse could consider changing antibiotics to broad spectrum versus CT of chest. No active pain or presentation consistent with PE  . HTN (hypertension): Stable, continue home medication  . Gout: Stable continue  allopurinol  . GERD (gastroesophageal reflux disease): Stable, continue PPI  . Generalized anxiety disorder: Stable continue home medications  . Tobacco use disorder: Patient counseled. No previous history of COPD. Lungs do not appear to hyperinflated. Nicotine patch.  . CKD (chronic kidney disease) stage 4, GFR 15-29 ml/min: Looks to be  close to baseline  Minimally elevated troponin: Almost certainly secondary to respiratory issues. We'll check repeat level in the morning and as long as not significantly elevated,disregard  Consultants: None  Code Status: DO NOT RESUSCITATE as confirmed by patient  Family Communication: Left message with daughter  Disposition Plan: Likely here for a few days and then discharged home  Time spent: 53 minutes  [facility] Pager [phone]

## 2014-08-19 NOTE — Plan of Care (Signed)
Called by Dr Maryan Rued from [facility]   Patient is 80 year old history of pneumonia diagnosed last week who had completed a course of Levaquin and saw her PCP for persistent cough nausea and decreased appetite. At that time labs were drawn she was given a shot of Rocephin and started on clarithromycin and Augmentin. Since that time patient has had persistent nausea with one episode of vomiting. Today presented to [facility] ED with worsening leukocytosis, slightly elevated troponin 0.04, creatinine 1.6, low grade temp 99.1 with tachycardia 118 Chest x-ray with persistent right-sided infiltrates  Accepted  To tele Inpatient with IV antibiotics, serial cardiac enzymes.    Kameelah Minish M.D. [facility] 08/19/2014, 1:37 PM  Pager: [phone]

## 2014-08-19 NOTE — Telephone Encounter (Signed)
Caller name: Erline Levine Relationship to patient: daughter Can be reached: [phone] Pharmacy:  Reason for call: Pt is not feeling any better. With the concern of Pneumonia pt has stated she wants to be taken to the ER. Daughter states she will bring her to the [facility] ER.

## 2014-08-19 NOTE — ED Notes (Signed)
[facility] at bedside. Report called to [place] RN.

## 2014-08-19 NOTE — Progress Notes (Signed)
ANTIBIOTIC CONSULT NOTE - INITIAL  Pharmacy Consult for vanc/zosyn Indication: pneumonia  Allergies  Allergen Reactions  . Morphine And Related Other (See Comments)    Hallucinations and combative  . Codeine Nausea Only    Patient Measurements: Height: '5\' 7"'$  (711.6 cm) Weight: 102 lb (46.267 kg) IBW/kg (Calculated) : 61.6   Vital Signs: Temp: 99.1 F (37.3 C) (06/17 1155) Temp Source: Oral (06/17 1155) BP: 150/57 mmHg (06/17 1330) Pulse Rate: 116 (06/17 1042) Intake/Output from previous day:   Intake/Output from this shift:    Labs:  Recent Labs  08/19/14 1130  WBC 16.0*  HGB 10.2*  PLT 243  CREATININE 1.64*   Estimated Creatinine Clearance: 21 mL/min (by C-G formula based on Cr of 1.64). No results for input(s): VANCOTROUGH, VANCOPEAK, VANCORANDOM, GENTTROUGH, GENTPEAK, GENTRANDOM, TOBRATROUGH, TOBRAPEAK, TOBRARND, AMIKACINPEAK, AMIKACINTROU, AMIKACIN in the last 72 hours.   Microbiology: No results found for this or any previous visit (from the past 720 hour(s)).  Medical History: Past Medical History  Diagnosis Date  . Arthritis     Thumbs  . Throat cancer     Vocal cord Cancer  . Chicken pox   . Depression   . Diverticulitis   . GERD (gastroesophageal reflux disease)   . Hypertension   . Kidney disease   . Colon polyp   . Stroke     Mini Stroke  . Carotid artery disease   . Oral-mouth cancer     Assessment: 60 yof to [facility] ED with worsening leukocytosis, slight trop elevation, SCr 1.6, tachycardia. CXR - R sided infiltrates. Diagnosed with pna last wk. Completed course of levo. WBC 16, afebrile, LA 1.37. No cultures ordered yet. SCr 1.64 on admit, CrCl~21  6/17 vanc>> 6/17 zosyn>>  Goal of Therapy:  Vancomycin trough level 15-20 mcg/ml  Plan:  Zosyn 3.375g IV (30 min inf); then zosyn 3.375g IV q8h  Vanc '500mg'$  IV q24h  Monitor renal function closely (right at cut-off for zosyn dose reduction)  Mon clinical progress, abx plan   Elicia Lamp, PharmD Clinical Pharmacist - Resident Pager [phone] 08/19/2014 2:05 PM

## 2014-08-19 NOTE — ED Provider Notes (Signed)
CSN: [id number]     Arrival date & time 08/19/14  1029 History   First MD Initiated Contact with Patient 08/19/14 1051     Chief Complaint  Patient presents with  . Cough     (Consider location/radiation/quality/duration/timing/severity/associated sxs/prior Treatment) HPI Comments: Patient with a history of pneumonia diagnosed last week who had completed a course of Levaquin and saw her doctor yesterday for persistent cough nausea and decreased appetite. At that time labs were drawn she was given a shot of Rocephin and started on clarithromycin and Augmentin. Since that time patient has had persistent nausea with one episode of vomiting. She denies significant shortness of breath but has no prior lung history. She is able to eat occasionally if she takes an antinausea pill. She denies any diarrhea. No significant chest pain but persistent nonproductive cough. No abdominal pain. Since she is having all the coughing she has spelled the muscle in her lower back so does have back pain every time she coughs. Because she was feeling no better today she came here for further care  Patient is a 80 y.o. female presenting with cough. The history is provided by the patient.  Cough Cough characteristics:  Non-productive and hacking Severity:  Severe Onset quality:  Gradual Duration:  2 weeks Timing:  Constant Progression:  Worsening Chronicity:  New Smoker: no   Associated symptoms: no fever and no shortness of breath   Associated symptoms comment:  Nausea, occasional vomiting, persistent coughing and lower back pain.   Past Medical History  Diagnosis Date  . Arthritis     Thumbs  . Throat cancer     Vocal cord Cancer  . Chicken pox   . Depression   . Diverticulitis   . GERD (gastroesophageal reflux disease)   . Hypertension   . Kidney disease   . Colon polyp   . Stroke     Mini Stroke  . Carotid artery disease   . Oral-mouth cancer    Past Surgical History  Procedure Laterality Date   . Appendectomy    . Tonsillectomy and adenoidectomy    . Vaginal hysterectomy    . Total hip arthroplasty Left   . Carotid endarterectomy    . Colonoscopy N/A 01/31/2014    Procedure: COLONOSCOPY;  Surgeon: Ladene Artist, MD;  Location: [facility];  Service: Endoscopy;  Laterality: N/A;  . Mouth cancer     Family History  Problem Relation Age of Onset  . Hypertension Father    History  Substance Use Topics  . Smoking status: Current Every Day Smoker -- 0.50 packs/day    Types: Cigarettes  . Smokeless tobacco: Never Used  . Alcohol Use: 0.0 oz/week    0 Standard drinks or equivalent per week   OB History    No data available     Review of Systems  Constitutional: Negative for fever.  Respiratory: Positive for cough. Negative for shortness of breath.   All other systems reviewed and are negative.     Allergies  Morphine and related and Codeine  Home Medications   Prior to Admission medications   Medication Sig Start Date End Date Taking? Authorizing Provider  acetaminophen (TYLENOL) 650 MG CR tablet Take 1,300 mg by mouth at bedtime.   Yes Historical Provider, MD  alendronate (FOSAMAX) 70 MG tablet Take 1 tablet (70 mg total) by mouth once a week. Take with a full glass of water on an empty stomach. 06/16/14  Yes Rosalita Chessman, DO  allopurinol (  ZYLOPRIM) 100 MG tablet TAKE 1 TABLET BY MOUTH AT BEDTIME 08/09/14  Yes Yvonne R Lowne, DO  ALPRAZolam (XANAX) 0.25 MG tablet Take 0.25 mg by mouth as needed for anxiety.   Yes Historical Provider, MD  amLODipine (NORVASC) 5 MG tablet TAKE 1 TABLET BY MOUTH DAILY FOR HYPEFRTENSION 06/27/14  Yes Yvonne R Lowne, DO  amoxicillin-clavulanate (AUGMENTIN) 875-125 MG per tablet Take 1 tablet by mouth 2 (two) times daily. 08/18/14  Yes Rosalita Chessman, DO  aspirin 81 MG tablet Take 81 mg by mouth daily.   Yes Historical Provider, MD  Azelastine-Fluticasone (DYMISTA) 137-50 MCG/ACT SUSP Place 1 spray into both nostrils 2 (two) times daily.  07/11/14  Yes Alferd Apa Lowne, DO  beta carotene w/minerals (OCUVITE) tablet Take 1 tablet by mouth every morning.   Yes Historical Provider, MD  Cholecalciferol 4000 UNITS CAPS Take 1 capsule by mouth every morning. Vitamin D 3   Yes Historical Provider, MD  clarithromycin (BIAXIN XL) 500 MG 24 hr tablet Take 2 tablets (1,000 mg total) by mouth daily. 08/18/14  Yes Alferd Apa Lowne, DO  diphenhydrAMINE (SIMPLY SLEEP) 25 MG tablet Take 25 mg by mouth at bedtime as needed for sleep.   Yes Historical Provider, MD  docusate sodium (COLACE) 100 MG capsule Take 100 mg by mouth every morning.   Yes Historical Provider, MD  ferrous sulfate 325 (65 FE) MG tablet Take 325 mg by mouth 2 (two) times daily with a meal.    Yes Historical Provider, MD  LORazepam (ATIVAN) 0.5 MG tablet Take 0.5 mg by mouth as needed for anxiety.   Yes Historical Provider, MD  metoprolol succinate (TOPROL-XL) 25 MG 24 hr tablet Take 25 mg by mouth every morning.   Yes Historical Provider, MD  mirtazapine (REMERON SOL-TAB) 15 MG disintegrating tablet TAKE 1 TABLET BY MOUTH EVERY NIGHT AT BEDTIME 06/09/14  Yes Rosalita Chessman, DO  Multiple Vitamins-Minerals (HM MULTIVITAMIN ADULT GUMMY) CHEW Chew 2 tablets by mouth every morning.   Yes Historical Provider, MD  omeprazole (PRILOSEC) 20 MG capsule Take 1 capsule (20 mg total) by mouth 2 (two) times daily before a meal. 04/04/14  Yes Yvonne R Lowne, DO  ondansetron (ZOFRAN-ODT) 8 MG disintegrating tablet Take 8 mg by mouth as needed for nausea or vomiting.   Yes Historical Provider, MD  PARoxetine (PAXIL) 10 MG tablet TAKE 1 TABLET BY MOUTH DAILY FOR DEPRESSION 06/27/14  Yes Yvonne R Lowne, DO  [name] 0.625 MG tablet TAKE 1 TABLET BY MOUTH DAILY FOR 21 DAYS THEN DO NOT TAKE FOR 7 DAYS 07/25/14  Yes Alferd Apa Lowne, DO  traZODone (DESYREL) 50 MG tablet Take 1 tablet (50 mg total) by mouth at bedtime. 05/30/14  Yes Alferd Apa Lowne, DO  allopurinol (ZYLOPRIM) 100 MG tablet TAKE 1 TABLET BY MOUTH AT  BEDTIME 08/09/14   Rosalita Chessman, DO  nicotine (NICOTROL) 10 MG inhaler Inhale 1 cartridge (1 continuous puffing total) into the lungs as needed for smoking cessation. 05/17/14   Alferd Apa Lowne, DO   BP 112/92 mmHg  Pulse 118  Temp(Src) 98.5 F (36.9 C) (Oral)  Resp 18  Ht '5\' 7"'$  (1.702 m)  Wt 102 lb (46.267 kg)  BMI 15.97 kg/m2  SpO2 97% Physical Exam  Constitutional: She is oriented to person, place, and time. She appears well-developed. She appears cachectic. No distress.  HENT:  Head: Normocephalic and atraumatic.  Mouth/Throat: Oropharynx is clear and moist.  Eyes: Conjunctivae and EOM are normal. Pupils  are equal, round, and reactive to light.  Neck: Normal range of motion. Neck supple.  Cardiovascular: Normal rate, regular rhythm and intact distal pulses.   No murmur heard. Pulmonary/Chest: Effort normal. No respiratory distress. She has no rales.  Occasional wheeze. After coughing lung sounds are clear  Abdominal: Soft. She exhibits no distension. There is no tenderness. There is no rebound and no guarding.  Musculoskeletal: Normal range of motion. She exhibits no edema or tenderness.  Neurological: She is alert and oriented to person, place, and time.  Skin: Skin is warm and dry. No rash noted. No erythema.  Psychiatric: She has a normal mood and affect. Her behavior is normal.  Nursing note and vitals reviewed.   ED Course  Procedures (including critical care time) Labs Review Labs Reviewed  CBC WITH DIFFERENTIAL/PLATELET - Abnormal; Notable for the following:    WBC 16.0 (*)    RBC 3.36 (*)    Hemoglobin 10.2 (*)    HCT 32.2 (*)    Neutrophils Relative % 95 (*)    Lymphocytes Relative 2 (*)    Monocytes Relative 2 (*)    Neutro Abs 15.2 (*)    Lymphs Abs 0.3 (*)    Basophils Absolute 0.2 (*)    All other components within normal limits  COMPREHENSIVE METABOLIC PANEL - Abnormal; Notable for the following:    Sodium 133 (*)    Chloride 95 (*)    Glucose, Bld  116 (*)    BUN 22 (*)    Creatinine, Ser 1.64 (*)    Calcium 8.0 (*)    Total Protein 6.3 (*)    Albumin 3.1 (*)    ALT 9 (*)    GFR calc non Af Amer 29 (*)    GFR calc Af Amer 34 (*)    All other components within normal limits  TROPONIN I - Abnormal; Notable for the following:    Troponin I 0.04 (*)    All other components within normal limits  I-STAT CG4 LACTIC ACID, ED    Imaging Review Dg Chest 2 View  08/19/2014   CLINICAL DATA:  Recent diagnosis of pneumonia with persistent cough and congestion  EXAM: CHEST - 2 VIEW  COMPARISON:  08/11/2014  FINDINGS: Cardiac shadow is stable. The lungs are well aerated bilaterally. Persistent right basilar changes are noted particularly on the frontal exam. No new focal infiltrate is seen. No bony abnormality is noted.  IMPRESSION: Persistent right basilar changes.   Electronically Signed   By: Inez Catalina M.D.   On: 08/19/2014 12:16     EKG Interpretation   Date/Time:  Friday August 19 2014 11:17:59 EDT Ventricular Rate:  100 PR Interval:  114 QRS Duration: 84 QT Interval:  338 QTC Calculation: 436 R Axis:   70 Text Interpretation:  Normal sinus rhythm Normal ECG No previous tracing  Confirmed by Maryan Rued  MD, Loree Fee ([id number]) on 08/19/2014 11:29:13 AM      MDM   Final diagnoses:  Cough   Patient presents with a history of pneumonia diagnosed last week initially completed a course of Levaquin and saw her doctor yesterday started on Augmentin and clarithromycin and given a shot of Rocephin in the office. She had returned to her doctor for a follow-up it was having persistent nausea, cough and lower back pain. Patient presents to emergency room today for persistent symptoms of cough, nausea and lower back pain. She denies significant shortness of breath or abdominal pain. She was able to eat  some last night and only had one episode of vomiting this morning. Antinausea medication was helpful. She denies any swelling or weight gain. No  prior history of cardiac disease.    On exam patient initially had scant wheezing and occasional rub which cleared with coughing. She is tachycardic within normal pressure and oxygen saturation.  Patient had labs drawn yesterday but the results have not returned. EKG today without acute findings. CBC, CMP, troponin, lactic acid, chest x-ray pending. Patient given IV fluids and nausea medication. Also possibility that patient's nausea is related to the amount of amber by Rx she's had recently. She denies any diarrhea or concern for C. difficile at this time.  1:19 PM Patient with a worsening leukocytosis of 16,000. Chest x-ray with persistent right basilar abnormality. Mildly elevated troponin but a normal EKG so unknown significance. Given patient's failure on outpatient treatment will admit for community-acquired pneumonia. Will continue Rocephin and azithromycin.    Blanchie Dessert, MD 08/19/14 1320

## 2014-08-19 NOTE — Progress Notes (Signed)
PHARMACY NOTE  CONSULT :  Azithromycin, Ceftriaxone INDICATION :  CAP, Pharmacy to adjust if required.  ASSESSMENT:  Pharmacy consulted for Renal adjustment of Antibiotics.  Vancomycin and Zosyn Discontinued.  CTX and Azithromycin started for CAP.    Marland Kitchen  Dosing Weight  46 kg,  estimated CrCl  21 ml/min  PLAN:  1. Continue Ceftriaxone and Azithromycin as previously ordered.  No Dosing adjustments required. 2. Recommend Monitoring WBC's, fever curve, any cultures/sensitivities, and clinical progression. 3. Pharmacy will Sign Off and follow peripherally given no adjustments in doses or schedules are anticipated.  There are alerts to indicate dramatic changes in clinical condition or lab results that might require dose or schedule adjustments.  Please re-consult if additional assistance is needed. Thank you for allowing Pharmacy to participate in this patient's care   Marthenia Rolling,  Pharm.D. ,  08/19/2014,  6:50 PM

## 2014-08-19 NOTE — ED Notes (Signed)
Pt reports worsening shortness of breath, cough since being diagnosed with Pneumonia 6/9 - pt has been on Levaquin - started on Augmentin, Clarithromycin yesterday with PCN IM - pt traveled back from [place] on 08/10/14 - pt reports nausea and worsening s/s, referred to ED by PCP based upon that.

## 2014-08-20 DIAGNOSIS — L8990 Pressure ulcer of unspecified site, unspecified stage: Secondary | ICD-10-CM | POA: Insufficient documentation

## 2014-08-20 DIAGNOSIS — E440 Moderate protein-calorie malnutrition: Secondary | ICD-10-CM

## 2014-08-20 DIAGNOSIS — E46 Unspecified protein-calorie malnutrition: Secondary | ICD-10-CM | POA: Insufficient documentation

## 2014-08-20 LAB — BASIC METABOLIC PANEL
ANION GAP: 12 (ref 5–15)
BUN: 19 mg/dL (ref 6–20)
CO2: 24 mmol/L (ref 22–32)
Calcium: 7.6 mg/dL — ABNORMAL LOW (ref 8.9–10.3)
Chloride: 96 mmol/L — ABNORMAL LOW (ref 101–111)
Creatinine, Ser: 1.6 mg/dL — ABNORMAL HIGH (ref 0.44–1.00)
GFR calc non Af Amer: 30 mL/min — ABNORMAL LOW (ref >60)
GFR, EST AFRICAN AMERICAN: 35 mL/min — AB (ref >60)
Glucose, Bld: 130 mg/dL — ABNORMAL HIGH (ref 65–99)
POTASSIUM: 4.1 mmol/L (ref 3.5–5.1)
SODIUM: 132 mmol/L — AB (ref 135–145)

## 2014-08-20 LAB — CBC
HCT: 32.4 % — ABNORMAL LOW (ref 36.0–46.0)
HEMOGLOBIN: 10.7 g/dL — AB (ref 12.0–15.0)
MCH: 31.3 pg (ref 26.0–34.0)
MCHC: 33 g/dL (ref 30.0–36.0)
MCV: 94.7 fL (ref 78.0–100.0)
PLATELETS: 304 10*3/uL (ref 150–400)
RBC: 3.42 MIL/uL — ABNORMAL LOW (ref 3.87–5.11)
RDW: 14.8 % (ref 11.5–15.5)
WBC: 8.6 10*3/uL (ref 4.0–10.5)

## 2014-08-20 LAB — TROPONIN I: Troponin I: <0.03 ng/mL (ref <0.031)

## 2014-08-20 LAB — HIV ANTIBODY (ROUTINE TESTING W REFLEX): HIV Screen 4th Generation wRfx: NONREACTIVE

## 2014-08-20 MED ORDER — PRO-STAT 64 PO LIQD
30.0000 mL | Freq: Every day | ORAL | Status: DC
  Administered 2014-08-20 – 2014-08-21 (×2): 30 mL via ORAL
  Filled 2014-08-20 (×3): qty 30

## 2014-08-20 NOTE — Progress Notes (Signed)
PROGRESS NOTE  Marissa Cruz OHY:[id number] DOB: Feb 20, 1935 DOA: 08/19/2014 PCP: Garnet Koyanagi, DO  HPI/Recap of past 17 hours: 80 year old female with past medical history of stage 3/4 chronic kidney disease and tobacco abuse admitted 6/17 for persistent pneumonia despite taking course of Levaquin by mouth.   Much improved today. Lung exam much more clear. No fevers overnight. White blood cell count normalized.  Assessment/Plan: Principal Problem:   CAP (community acquired pneumonia): Responded to antibiotics. Given no response to by mouth Levaquin, would favor continuing one more day of IV antibiotics. Continue nebs plus steroids.  Taper steroids and change nebs to when necessary only Active Problems:   HTN (hypertension): Stable continue home meds   Generalized anxiety disorder: Stable   Gout: Stable continue allopurinol   GERD (gastroesophageal reflux disease)   Tobacco use disorder: Counseled. Nicotine patch.   CKD (chronic kidney disease) stage 4, GFR 15-29 ml/min   Malnutrition of moderate degree: Patient meets criteria and the context of acute illness/injury. Seen by nutrition. Started on pro-stat daily plus Magic cup with dinner   Pressure ulcer: Present on admission.   Code Status: DO NOT RESUSCITATE  Family Communication: Left message with daughter  Disposition Plan: Likely home tomorrow   Consultants:  None  Procedures:  None  Antibiotics:  IV Zithromax 6/17-present  IV Rocephin 6/17-present   Objective: BP 149/86 mmHg  Pulse 89  Temp(Src) 98.3 F (36.8 C) (Oral)  Resp 18  Ht '5\' 7"'$  (1.702 m)  Wt 47.628 kg (105 lb)  BMI 16.44 kg/m2  SpO2 96%  Intake/Output Summary (Last 24 hours) at 08/20/14 1653 Last data filed at 08/20/14 1230  Gross per 24 hour  Intake 1686.25 ml  Output    452 ml  Net 1234.25 ml   Filed Weights   08/19/14 1035 08/19/14 2008  Weight: 46.267 kg (102 lb) 47.628 kg (105 lb)    Exam:   General:  Alert and oriented 3,  no acute distress  Cardiovascular: Regular rate and rhythm, S1-S2  Respiratory: Decreased breath sounds throughout  Abdomen: Soft, nontender, nondistended, positive bowel sounds  Musculoskeletal: No edema   Data Reviewed: Basic Metabolic Panel:  Recent Labs Lab 08/18/14 1519 08/19/14 1130 08/19/14 1846 08/20/14 0608  NA 132* 133*  --  132*  K 4.4 4.0  --  4.1  CL 95* 95*  --  96*  CO2 25 24  --  24  GLUCOSE 73 116*  --  130*  BUN 19 22*  --  19  CREATININE 1.54* 1.64* 1.63* 1.60*  CALCIUM 8.9 8.0*  --  7.6*   Liver Function Tests:  Recent Labs Lab 08/19/14 1130  AST 20  ALT 9*  ALKPHOS 72  BILITOT 0.5  PROT 6.3*  ALBUMIN 3.1*   No results for input(s): LIPASE, AMYLASE in the last 168 hours. No results for input(s): AMMONIA in the last 168 hours. CBC:  Recent Labs Lab 08/18/14 1519 08/19/14 1130 08/19/14 1846 08/20/14 0608  WBC 15.6* 16.0* 12.6* 8.6  NEUTROABS 12.4* 15.2*  --   --   HGB 11.5* 10.2* 10.5* 10.7*  HCT 36.3 32.2* 32.1* 32.4*  MCV 97.9 95.8 96.7 94.7  PLT 350.0 243 314 304   Cardiac Enzymes:    Recent Labs Lab 08/19/14 1140 08/20/14 0608  TROPONINI 0.04* <0.03   BNP (last 3 results) No results for input(s): BNP in the last 8760 hours.  ProBNP (last 3 results) No results for input(s): PROBNP in the last 8760 hours.  CBG:  No results for input(s): GLUCAP in the last 168 hours.  No results found for this or any previous visit (from the past 240 hour(s)).   Studies: No results found.  Scheduled Meds: . acetaminophen  1,000 mg Oral QHS  . allopurinol  100 mg Oral QHS  . amLODipine  5 mg Oral Daily  . aspirin EC  81 mg Oral Daily  . azithromycin  500 mg Intravenous Q24H  . beta carotene w/minerals  1 tablet Oral q morning - 10a  . cefTRIAXone (ROCEPHIN)  IV  1 g Intravenous Q24H  . docusate sodium  100 mg Oral q morning - 10a  . enoxaparin (LOVENOX) injection  30 mg Subcutaneous Q24H  . feeding supplement (PRO-STAT 64)  30  mL Oral Q2000  . ferrous sulfate  325 mg Oral BID WC  . metoprolol succinate  25 mg Oral q morning - 10a  . mirtazapine  15 mg Oral QHS  . nicotine  21 mg Transdermal Daily  . pantoprazole  40 mg Oral Daily  . PARoxetine  10 mg Oral Daily  . predniSONE  50 mg Oral Q breakfast  . sodium chloride  3 mL Intravenous Q12H  . traZODone  50 mg Oral QHS    Continuous Infusions:    Time spent: 15 min  [facility] Pager [phone] . If 7PM-7AM, please contact night-coverage at [email or web address], password [facility] 08/20/2014, 4:53 PM  LOS: 1 day

## 2014-08-20 NOTE — Progress Notes (Signed)
Initial Nutrition Assessment  DOCUMENTATION CODES: Non-severe (moderate) malnutrition in context of acute illness/injury  INTERVENTION: Magic cup with Dinner, each supplement provides 290 kcal and 9 grams of protein  Prostat Daily (each supplement provides 100 kcals, 15 g Pro)  NUTRITION DIAGNOSIS: Inadequate oral intake related to acute illness as evidenced by reported energy intake < 75% for > 7 days.  GOAL: Patient will meet greater than or equal to 90% of their needs  MONITOR: PO intake, Supplement acceptance, Labs, I & O's, Skin  REASON FOR ASSESSMENT: Consult Assessment of nutrition requirement/status  ASSESSMENT: 80 y.o. female PMHx of tobacco abuse, hypertension and stage III-4 chronic kidney disease. Recently had gotten pneumonia, took most of antibiotic course with no improvement. Persistent SOB+Cough. Admitted for further eval. WBC: 16  Spoke with pt. She reports acquiring the pneumonia about 2 weeks ago. In that time she reports n/v/d and constipation from the anti nausea medication. She has been eating poorly as well. She would eat 2 meals a day but they were small. She drinks carnation instant breakfast each day-does not like boost/ensure. She did not follow any diet at home. She did take mvi, iron, calcium.   She reports normal weight is 106. She believes she has lost 4 lbs these past 2 weeks. Documentation would indicate 2 lb loss (just under 2%) . Patient and daughter report she has always been extremely thin which EMR documentation confirms.   NFPE: Pt is wheel chair bound, lower extremity atrophy expected. She does have upper body severe muscle/fat wasting. However, this is not new and unrelated to this acute illness. Pt reports being this thin for quite a while.    Height: Ht Readings from Last 1 Encounters:  08/19/14 '5\' 7"'$  (1.702 m)    Weight: Wt Readings from Last 1 Encounters:  08/19/14 105 lb (47.628 kg)    Ideal Body Weight:  61.4 kg  Wt Readings  from Last 10 Encounters:  08/19/14 105 lb (47.628 kg)  08/18/14 100 lb 3.2 oz (45.45 kg)  08/11/14 102 lb (46.267 kg)  07/11/14 105 lb (47.628 kg)  06/28/14 104 lb (47.174 kg)  05/17/14 105 lb 9.6 oz (47.9 kg)  01/24/14 101 lb 12.8 oz (46.176 kg)  01/07/14 101 lb 12.8 oz (46.176 kg)  10/21/13 99 lb 6.4 oz (45.088 kg)  6/16: 100 lbs (45.45 kg)  BMI:  Body mass index is 16.44 kg/(m^2).  Estimated Nutritional Needs: Kcal:  1500-1700 (33-37 kcal/kg) Protein:  64-73 (1.4-1.6 g/kg) Fluid:  1.5-1.7 lliters  Skin:  PU stage 1-sacrum  Diet Order:  Diet 2 gram sodium Room service appropriate?: Yes; Fluid consistency:: Thin  EDUCATION NEEDS: No education needs identified at this time   Intake/Output Summary (Last 24 hours) at 08/20/14 1115 Last data filed at 08/20/14 0600  Gross per 24 hour  Intake 1446.25 ml  Output    150 ml  Net 1296.25 ml   Last BM:  6/15  Burtis Junes RD, LDN Nutrition Pager: [phone] 08/20/2014 11:15 AM

## 2014-08-21 DIAGNOSIS — F411 Generalized anxiety disorder: Secondary | ICD-10-CM

## 2014-08-21 LAB — BRAIN NATRIURETIC PEPTIDE: B Natriuretic Peptide: 187.2 pg/mL — ABNORMAL HIGH (ref 0.0–100.0)

## 2014-08-21 MED ORDER — ALBUTEROL SULFATE (2.5 MG/3ML) 0.083% IN NEBU
2.5000 mg | INHALATION_SOLUTION | Freq: Three times a day (TID) | RESPIRATORY_TRACT | Status: DC
  Administered 2014-08-21 (×3): 2.5 mg via RESPIRATORY_TRACT
  Filled 2014-08-21 (×4): qty 3

## 2014-08-21 NOTE — Progress Notes (Signed)
PROGRESS NOTE  Marissa Cruz JKK:[id number] DOB: 11-Mar-1934 DOA: 08/19/2014 PCP: Garnet Koyanagi, DO  HPI/Recap of past 46 hours: 80 year old female with past medical history of stage 3/4 chronic kidney disease and tobacco abuse admitted 6/17 for persistent pneumonia despite taking course of Levaquin by mouth.   By hospital day 2, patient much improved. Lungs clear. However today, more coughing, wheezing on exam noted. Patient is soft does not feel well.  Assessment/Plan: Principal Problem:   CAP (community acquired pneumonia): Responded to antibiotics. Given no response to by mouth Levaquin, would favor continuing one more day of IV antibiotics. Continue nebs plus steroids.  Taper stable rate, we had scheduled nebulizers.  BNP checked and found to be normal. We'll have speech therapy check for dysphagia  Essential hypertension: Continue home meds Generalized anxiety disorder: Stable   Gout: Stable continue allopurinol   GERD (gastroesophageal reflux disease)   Tobacco use disorder: Counseled. Nicotine patch.   CKD (chronic kidney disease) stage 4, GFR 15-29 ml/min   Malnutrition of moderate degree: Patient meets criteria and the context of acute illness/injury. Seen by nutrition. Started on pro-stat daily plus Magic cup with dinner   Pressure ulcer: Present on admission.   Code Status: DO NOT RESUSCITATE  Family Communication: Left message with daughter  Disposition Plan: possibly home tomorrow if feeling better    Consultants:  None  Procedures:  None  Antibiotics:  IV Zithromax 6/17-present  IV Rocephin 6/17-present   Objective: BP 158/52 mmHg  Pulse 79  Temp(Src) 99.1 F (37.3 C) (Oral)  Resp 18  Ht '5\' 7"'$  (1.702 m)  Wt 45.36 kg (100 lb)  BMI 15.66 kg/m2  SpO2 96%  Intake/Output Summary (Last 24 hours) at 08/21/14 1243 Last data filed at 08/21/14 0840  Gross per 24 hour  Intake  24410 ml  Output    301 ml  Net  24109 ml   Filed Weights   08/19/14 1035  08/19/14 2008 08/20/14 2007  Weight: 46.267 kg (102 lb) 47.628 kg (105 lb) 45.36 kg (100 lb)    Exam:   General:  Alert and oriented 3, no acute distress  Cardiovascular: Regular rate and rhythm, S1-S2  Respiratory: Decreased breath sounds throughout, mild expiratory wheezing, no crackles   Abdomen: Soft, nontender, nondistended, positive bowel sounds  Musculoskeletal: No edema   Data Reviewed: Basic Metabolic Panel:  Recent Labs Lab 08/18/14 1519 08/19/14 1130 08/19/14 1846 08/20/14 0608  NA 132* 133*  --  132*  K 4.4 4.0  --  4.1  CL 95* 95*  --  96*  CO2 25 24  --  24  GLUCOSE 73 116*  --  130*  BUN 19 22*  --  19  CREATININE 1.54* 1.64* 1.63* 1.60*  CALCIUM 8.9 8.0*  --  7.6*   Liver Function Tests:  Recent Labs Lab 08/19/14 1130  AST 20  ALT 9*  ALKPHOS 72  BILITOT 0.5  PROT 6.3*  ALBUMIN 3.1*   No results for input(s): LIPASE, AMYLASE in the last 168 hours. No results for input(s): AMMONIA in the last 168 hours. CBC:  Recent Labs Lab 08/18/14 1519 08/19/14 1130 08/19/14 1846 08/20/14 0608  WBC 15.6* 16.0* 12.6* 8.6  NEUTROABS 12.4* 15.2*  --   --   HGB 11.5* 10.2* 10.5* 10.7*  HCT 36.3 32.2* 32.1* 32.4*  MCV 97.9 95.8 96.7 94.7  PLT 350.0 243 314 304   Cardiac Enzymes:    Recent Labs Lab 08/19/14 1140 08/20/14 0608  TROPONINI 0.04* <0.03  BNP (last 3 results)  Recent Labs  08/21/14 1046  BNP 187.2*    ProBNP (last 3 results) No results for input(s): PROBNP in the last 8760 hours.  CBG: No results for input(s): GLUCAP in the last 168 hours.  No results found for this or any previous visit (from the past 240 hour(s)).   Studies: No results found.  Scheduled Meds: . acetaminophen  1,000 mg Oral QHS  . albuterol  2.5 mg Nebulization TID  . allopurinol  100 mg Oral QHS  . amLODipine  5 mg Oral Daily  . aspirin EC  81 mg Oral Daily  . azithromycin  500 mg Intravenous Q24H  . beta carotene w/minerals  1 tablet Oral q  morning - 10a  . cefTRIAXone (ROCEPHIN)  IV  1 g Intravenous Q24H  . docusate sodium  100 mg Oral q morning - 10a  . enoxaparin (LOVENOX) injection  30 mg Subcutaneous Q24H  . feeding supplement (PRO-STAT 64)  30 mL Oral Q2000  . ferrous sulfate  325 mg Oral BID WC  . metoprolol succinate  25 mg Oral q morning - 10a  . mirtazapine  15 mg Oral QHS  . nicotine  21 mg Transdermal Daily  . pantoprazole  40 mg Oral Daily  . PARoxetine  10 mg Oral Daily  . predniSONE  50 mg Oral Q breakfast  . sodium chloride  3 mL Intravenous Q12H  . traZODone  50 mg Oral QHS    Continuous Infusions:    Time spent: 25 min  [facility] Pager [phone] . If 7PM-7AM, please contact night-coverage at [email or web address], password [facility] 08/21/2014, 12:43 PM  LOS: 2 days

## 2014-08-21 NOTE — Progress Notes (Signed)
Utilization review completed.  

## 2014-08-22 DIAGNOSIS — L8990 Pressure ulcer of unspecified site, unspecified stage: Secondary | ICD-10-CM

## 2014-08-22 LAB — CBC
HCT: 34.5 % — ABNORMAL LOW (ref 36.0–46.0)
Hemoglobin: 11.2 g/dL — ABNORMAL LOW (ref 12.0–15.0)
MCH: 30.9 pg (ref 26.0–34.0)
MCHC: 32.5 g/dL (ref 30.0–36.0)
MCV: 95 fL (ref 78.0–100.0)
PLATELETS: 422 10*3/uL — AB (ref 150–400)
RBC: 3.63 MIL/uL — ABNORMAL LOW (ref 3.87–5.11)
RDW: 14.6 % (ref 11.5–15.5)
WBC: 16.5 10*3/uL — ABNORMAL HIGH (ref 4.0–10.5)

## 2014-08-22 LAB — CLOSTRIDIUM DIFFICILE BY PCR: CDIFFPCR: NEGATIVE

## 2014-08-22 MED ORDER — AZITHROMYCIN 500 MG PO TABS: 500.0000 mg | ORAL_TABLET | Freq: Every day | ORAL | Status: AC

## 2014-08-22 MED ORDER — PROMETHAZINE-CODEINE 6.25-10 MG/5ML PO SYRP: 5.0000 mL | ORAL_SOLUTION | Freq: Four times a day (QID) | ORAL | Status: DC | PRN

## 2014-08-22 MED ORDER — ALBUTEROL SULFATE HFA 108 (90 BASE) MCG/ACT IN AERS: 2.0000 | INHALATION_SPRAY | Freq: Four times a day (QID) | RESPIRATORY_TRACT | Status: DC | PRN

## 2014-08-22 MED ORDER — CEFACLOR 500 MG PO CAPS: 500.0000 mg | ORAL_CAPSULE | Freq: Two times a day (BID) | ORAL | Status: DC

## 2014-08-22 MED ORDER — PREDNISOLONE 5 MG PO TABS: ORAL_TABLET | ORAL | Status: DC

## 2014-08-22 NOTE — Clinical Documentation Improvement (Signed)
  08/21/14 Progr note.Marland KitchenMarland Kitchen"Pressure ulcer: Present on admission."... 08/19/14 H&P...she ambulates by wheelchair Review of systems are otherwise negative..."..Marland Kitchen"Physical exam: .Marland KitchenMarland KitchenSkin: No skin breaks, tears or lesions"... For accurate Dx specificity & severity can noted "Pressure ulcer: Present on admission." be further specified w/ location, stage & tx. Thank you  Possible Clinical Conditions?  Stage  I  Pressure Ulcer   (reddening of the skin) Stage  II Pressure Ulcer  (blister open or unopened) Stage  III Pressure Ulcer (through all layers skin) Stage IV Pressure Ulcer   (through skin & underlying  muscle, tendons, and bones) Other Condition Cannot Clinically Determine   Supporting Information: See above note  Thank You, Ermelinda Das, RN, BSN, CCDS Certified Clinical Documentation Specialist Pager: [facility]: Health Information Management

## 2014-08-22 NOTE — Progress Notes (Signed)
Patient ID: Marissa Cruz, female    DOB: 1934-05-10  Age: 80 y.o. MRN: [id number]    Subjective:  Subjective HPI Marissa Cruz presents f/u pneumonia.  Pt feeling better but still with cough-- productive.  See previous visit and cxr.  Pt daughter concerned and feels pt is no better.    Review of Systems  Constitutional: Negative for fever and chills.  HENT: Negative for congestion, postnasal drip, rhinorrhea and sinus pressure.   Respiratory: Positive for cough, chest tightness, shortness of breath and wheezing.   Cardiovascular: Negative for chest pain, palpitations and leg swelling.  Allergic/Immunologic: Negative for environmental allergies.  Psychiatric/Behavioral: Negative for decreased concentration. The patient is not nervous/anxious.     History Past Medical History  Diagnosis Date  . Chicken pox   . Depression   . Diverticulitis   . GERD (gastroesophageal reflux disease)   . Hypertension   . Kidney disease   . Colon polyp   . Carotid artery disease   . CAP (community acquired pneumonia) 08/19/2014  . Vocal cord cancer ~ 2009    S/P radiation  . 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 Anemia   . History of blood transfusion 1976; 2013    "w/hysterectomy; hip OR"  . TIA (transient ischemic attack) ~ 2009 X 2  . Arthritis     "thumbs, hands" (08/18/2014)  . Chronic lower back pain   . History of gout   . Anxiety   . Age-related macular degeneration, wet, left eye   . Age-related macular degeneration, dry, right eye     She has past surgical history that includes Abdominal hysterectomy (1976); Total hip arthroplasty (Left, 2013); Carotid endarterectomy (Left, ~ 2014); Colonoscopy (N/A, 01/31/2014); Mouth floor mass excision (03/2014); Tonsillectomy (1941); Appendectomy (1954); Joint replacement; Dilation and curettage of uterus; and Cataract extraction w/ intraocular lens  implant, bilateral (Bilateral, ~ 2009).   Her family history includes  Hypertension in her father.She reports that she has been smoking Cigarettes.  She has a 30 pack-year smoking history. She has never used smokeless tobacco. She reports that she drinks about 8.4 oz of alcohol per week. She reports that she does not use illicit drugs.  No current facility-administered medications on file prior to visit.   Current Outpatient Prescriptions on File Prior to Visit  Medication Sig Dispense Refill  . acetaminophen (TYLENOL) 650 MG CR tablet Take 1,300 mg by mouth at bedtime.    Marland Kitchen alendronate (FOSAMAX) 70 MG tablet Take 1 tablet (70 mg total) by mouth once a week. Take with a full glass of water on an empty stomach. 4 tablet 11  . allopurinol (ZYLOPRIM) 100 MG tablet TAKE 1 TABLET BY MOUTH AT BEDTIME 90 tablet 0  . ALPRAZolam (XANAX) 0.25 MG tablet Take 0.25 mg by mouth as needed for anxiety.    Marland Kitchen amLODipine (NORVASC) 5 MG tablet TAKE 1 TABLET BY MOUTH DAILY FOR HYPEFRTENSION 90 tablet 1  . aspirin 81 MG tablet Take 81 mg by mouth daily.    . Azelastine-Fluticasone (DYMISTA) 137-50 MCG/ACT SUSP Place 1 spray into both nostrils 2 (two) times daily. 23 g 3  . beta carotene w/minerals (OCUVITE) tablet Take 1 tablet by mouth every morning.    . Cholecalciferol 4000 UNITS CAPS Take 1 capsule by mouth every morning. Vitamin D 3    . diphenhydrAMINE (SIMPLY SLEEP) 25 MG tablet Take 25 mg by mouth at bedtime as needed for sleep.    Marland Kitchen docusate  sodium (COLACE) 100 MG capsule Take 100 mg by mouth every morning.    . ferrous sulfate 325 (65 FE) MG tablet Take 325 mg by mouth 2 (two) times daily with a meal.     . LORazepam (ATIVAN) 0.5 MG tablet Take 0.5 mg by mouth as needed for anxiety.    . metoprolol succinate (TOPROL-XL) 25 MG 24 hr tablet Take 25 mg by mouth every morning.    . mirtazapine (REMERON SOL-TAB) 15 MG disintegrating tablet TAKE 1 TABLET BY MOUTH EVERY NIGHT AT BEDTIME 90 tablet 0  . Multiple Vitamins-Minerals (HM MULTIVITAMIN ADULT GUMMY) CHEW Chew 2 tablets by  mouth every morning.    . nicotine (NICOTROL) 10 MG inhaler Inhale 1 cartridge (1 continuous puffing total) into the lungs as needed for smoking cessation. 42 each 3  . omeprazole (PRILOSEC) 20 MG capsule Take 1 capsule (20 mg total) by mouth 2 (two) times daily before a meal. 60 capsule 11  . ondansetron (ZOFRAN-ODT) 8 MG disintegrating tablet Take 8 mg by mouth as needed for nausea or vomiting.    Marland Kitchen PARoxetine (PAXIL) 10 MG tablet TAKE 1 TABLET BY MOUTH DAILY FOR DEPRESSION 90 tablet 1  . [name] 0.625 MG tablet TAKE 1 TABLET BY MOUTH DAILY FOR 21 DAYS THEN DO NOT TAKE FOR 7 DAYS 90 tablet 0  . traZODone (DESYREL) 50 MG tablet Take 1 tablet (50 mg total) by mouth at bedtime. 30 tablet 2     Objective:  Objective Physical Exam  Constitutional: She is oriented to person, place, and time. She appears well-developed and well-nourished.  HENT:  Right Ear: External ear normal.  Left Ear: External ear normal.  + PND + errythema  Eyes: Conjunctivae are normal. Right eye exhibits no discharge. Left eye exhibits no discharge.  Cardiovascular: Normal rate, regular rhythm and normal heart sounds.   No murmur heard. Pulmonary/Chest: Effort normal. No respiratory distress. She has wheezes. She has rales. She exhibits no tenderness.  Pt givne duoneb with little improvement  Musculoskeletal: She exhibits no edema.  Lymphadenopathy:    She has cervical adenopathy.  Neurological: She is alert and oriented to person, place, and time.   BP 123/72 mmHg  Pulse 115  Temp(Src) 98.7 F (37.1 C) (Oral)  Resp 18  Ht '5\' 5"'$  (1.651 m)  Wt 100 lb 3.2 oz (45.45 kg)  BMI 16.67 kg/m2  SpO2 94% Wt Readings from Last 3 Encounters:  08/21/14 101 lb (45.813 kg)  08/18/14 100 lb 3.2 oz (45.45 kg)  08/11/14 102 lb (46.267 kg)     Lab Results  Component Value Date   WBC 8.6 08/20/2014   HGB 10.7* 08/20/2014   HCT 32.4* 08/20/2014   PLT 304 08/20/2014   GLUCOSE 130* 08/20/2014   CHOL 168 05/24/2014    TRIG 100.0 05/24/2014   HDL 85.30 05/24/2014   LDLDIRECT 73.2 10/21/2013   LDLCALC 63 05/24/2014   ALT 9* 08/19/2014   AST 20 08/19/2014   NA 132* 08/20/2014   K 4.1 08/20/2014   CL 96* 08/20/2014   CREATININE 1.60* 08/20/2014   BUN 19 08/20/2014   CO2 24 08/20/2014   TSH 3.95 05/24/2014    Dg Chest 2 View  08/19/2014   CLINICAL DATA:  Recent diagnosis of pneumonia with persistent cough and congestion  EXAM: CHEST - 2 VIEW  COMPARISON:  08/11/2014  FINDINGS: Cardiac shadow is stable. The lungs are well aerated bilaterally. Persistent right basilar changes are noted particularly on the frontal exam. No  new focal infiltrate is seen. No bony abnormality is noted.  IMPRESSION: Persistent right basilar changes.   Electronically Signed   By: Inez Catalina M.D.   On: 08/19/2014 12:16     Assessment & Plan:  Plan I have discontinued Ms. Delude's levofloxacin. I am also having her start on amoxicillin-clavulanate and clarithromycin. Additionally, I am having her maintain her metoprolol succinate, ferrous sulfate, docusate sodium, beta carotene w/minerals, aspirin, Cholecalciferol, HM MULTIVITAMIN ADULT GUMMY, acetaminophen, diphenhydrAMINE, ondansetron, ALPRAZolam, LORazepam, omeprazole, nicotine, traZODone, mirtazapine, alendronate, amLODipine, PARoxetine, Azelastine-Fluticasone, [name], and allopurinol. We administered cefTRIAXone and ipratropium-albuterol.  Meds ordered this encounter  Medications  . amoxicillin-clavulanate (AUGMENTIN) 875-125 MG per tablet    Sig: Take 1 tablet by mouth 2 (two) times daily.    Dispense:  20 tablet    Refill:  0  . clarithromycin (BIAXIN XL) 500 MG 24 hr tablet    Sig: Take 2 tablets (1,000 mg total) by mouth daily.    Dispense:  28 tablet    Refill:  0  . cefTRIAXone (ROCEPHIN) injection 1 g    Sig:     Order Specific Question:  Antibiotic Indication:    Answer:  Other Indication (list below)    Order Specific Question:  Other Indication:    Answer:   Pneumonia  . ipratropium-albuterol (DUONEB) 0.5-2.5 (3) MG/3ML nebulizer solution 3 mL    Sig:     Problem List Items Addressed This Visit    CAP (community acquired pneumonia) - Primary    Did discuss ER with pt-- she does not want to go to hospital meds changed per orders Pt and daughter instructed to take her to ER if symptoms do not improve or worsen      Relevant Medications   amoxicillin-clavulanate (AUGMENTIN) 875-125 MG per tablet   clarithromycin (BIAXIN XL) 500 MG 24 hr tablet   cefTRIAXone (ROCEPHIN) injection 1 g (Completed)   ipratropium-albuterol (DUONEB) 0.5-2.5 (3) MG/3ML nebulizer solution 3 mL (Completed)   Other Relevant Orders   Basic metabolic panel (Completed)   CBC with Differential/Platelet (Completed)      Follow-up: Return in about 2 weeks (around 09/01/2014), or if symptoms worsen or fail to improve.  Garnet Koyanagi, DO

## 2014-08-22 NOTE — Discharge Summary (Signed)
Discharge Summary  Marissa Cruz GQB:[id number] DOB: Aug 01, 1934  PCP: Garnet Koyanagi, DO  Admit date: 08/19/2014 Discharge date: 08/22/2014  Time spent: 25 minutes  Recommendations for Outpatient Follow-up:  1. New medication: Zithromax 500 mg by mouth daily 4 days 2. New medication: Prednisone taper 3. New medication: Albuterol inhaler 2 puffs 4 times a day scheduled for the next 2 days, then as needed 4. New medication: Ceclor 500 mg by mouth twice a day times the next 4 days  5. Patient will follow up with her PCP in the next 2 weeks: 6. New medication: Cough medicine when necessary  Discharge Diagnoses:  Active Hospital Problems   Diagnosis Date Noted  . CAP (community acquired pneumonia) 08/19/2014  . Malnutrition of moderate degree 08/20/2014  . Pressure ulcer 08/20/2014  . Tobacco use disorder 08/19/2014  . CKD (chronic kidney disease) stage 4, GFR 15-29 ml/min 08/19/2014  . HTN (hypertension) 10/21/2013  . Gout 10/21/2013  . GERD (gastroesophageal reflux disease) 10/21/2013  . Generalized anxiety disorder 10/21/2013    Resolved Hospital Problems   Diagnosis Date Noted Date Resolved  No resolved problems to display.    Discharge Condition: Improved, being discharged home  Diet recommendation: Low-sodium  Filed Weights   08/19/14 2008 08/20/14 2007 08/21/14 2102  Weight: 47.628 kg (105 lb) 45.36 kg (100 lb) 45.813 kg (101 lb)    History of present illness:  80 year old female with past medical history of stage 3/4 chronic kidney disease and tobacco abuse admitted 6/17 for persistent pneumonia despite taking course of Levaquin by mouth.   Hospital Course:  Principal Problem:   CAP (community acquired pneumonia): On initial exam, patient noted to have some wheezing and rales. Started on antibiotics plus nebulizers and steroid-induced plus cough medicine. Patient not hypoxic so supplemental oxygen not needed. Although she had proved to be on course of Levaquin, she  showed no signs of aspiration-confirmed by speech therapy, and no other indications that treat this with broad-spectrum antibiotics from healthcare associated pneumonia. Treated with IV Rocephin and Zithromax. By following day, patient had some improvement although by 6/19, backslid. Nebs added as scheduled plus continued prednisone taper and by 6/20, patient markedly improved with breathing easier, otherwise stable. Noted to have some leukocytosis at this point, attributed to steroids, plan will be to discharge patient on scheduled albuterol, then switch to when necessary plus prednisone taper plus several more days of by mouth Zithromax and cephalosporins. Follow-up with PCP in the next few weeks Active Problems:   HTN (hypertension): Stable continue home medications    Generalized anxiety disorder: Stable continue on home medications    Gout: Continue allopurinol    GERD (gastroesophageal reflux disease): Continue PPI.    Tobacco use disorder: Counseled. Responded well to nicotine patch. She says that she is going to try to quit smoking altogether. Daughter has nicotine patches at home that she will continue so no prescription given. Advised the patient that hopefully she will quit smoking, but if she does not, to at least not smoke at least the next week while she is still in recovery. She says she will do so.    CKD (chronic kidney disease) stage 4, GFR 15-29 ml/min: Stable. At baseline    Malnutrition of moderate degree: Patient is criteria in the context of acute illness/injury.  Seen by nutrition. Started on ProStep daily plus Magic cup with dinner.    Pressure ulcer: Stage I on decubitus. Present on admission. Patient while some what ambulatory, for the most part  gets around by wheelchair which is what's causing this.    Procedures:  None  Consultations:  None  Discharge Exam: BP 141/57 mmHg  Pulse 86  Temp(Src) 97.6 F (36.4 C) (Oral)  Resp 18  Ht '5\' 7"'$  (1.702 m)  Wt  45.813 kg (101 lb)  BMI 15.82 kg/m2  SpO2 98%  General: *Alert and oriented 3 Cardiovascular: Regular rate and rhythm, S1-S2 Respiratory: Decreased breath sounds throughout, minimal end expiratory wheezing at bases, much improved  Discharge Instructions You were cared for by a hospitalist during your hospital stay. If you have any questions about your discharge medications or the care you received while you were in the hospital after you are discharged, you can call the unit and asked to speak with the hospitalist on call if the hospitalist that took care of you is not available. Once you are discharged, your primary care physician will handle any further medical issues. Please note that NO REFILLS for any discharge medications will be authorized once you are discharged, as it is imperative that you return to your primary care physician (or establish a relationship with a primary care physician if you do not have one) for your aftercare needs so that they can reassess your need for medications and monitor your lab values.  Discharge Instructions    Diet - low sodium heart healthy    Complete by:  As directed      Increase activity slowly    Complete by:  As directed             Medication List    STOP taking these medications        amoxicillin-clavulanate 875-125 MG per tablet  Commonly known as:  AUGMENTIN     clarithromycin 500 MG 24 hr tablet  Commonly known as:  BIAXIN XL      TAKE these medications        acetaminophen 650 MG CR tablet  Commonly known as:  TYLENOL  Take 1,300 mg by mouth at bedtime.     albuterol 108 (90 BASE) MCG/ACT inhaler  Commonly known as:  PROVENTIL HFA;VENTOLIN HFA  Inhale 2 puffs into the lungs every 6 (six) hours as needed for wheezing or shortness of breath.     alendronate 70 MG tablet  Commonly known as:  FOSAMAX  Take 1 tablet (70 mg total) by mouth once a week. Take with a full glass of water on an empty stomach.     allopurinol 100  MG tablet  Commonly known as:  ZYLOPRIM  TAKE 1 TABLET BY MOUTH AT BEDTIME     ALPRAZolam 0.25 MG tablet  Commonly known as:  XANAX  Take 0.25 mg by mouth as needed for anxiety.     amLODipine 5 MG tablet  Commonly known as:  NORVASC  TAKE 1 TABLET BY MOUTH DAILY FOR HYPEFRTENSION     aspirin 81 MG tablet  Take 81 mg by mouth daily.     Azelastine-Fluticasone 137-50 MCG/ACT Susp  Commonly known as:  DYMISTA  Place 1 spray into both nostrils 2 (two) times daily.     azithromycin 500 MG tablet  Commonly known as:  ZITHROMAX  Take 1 tablet (500 mg total) by mouth daily.     beta carotene w/minerals tablet  Take 1 tablet by mouth every morning.     HM MULTIVITAMIN ADULT GUMMY Chew  Chew 2 tablets by mouth every morning.     cefaclor 500 MG capsule  Commonly known as:  CECLOR  Take 1 capsule (500 mg total) by mouth 2 (two) times daily.     Cholecalciferol 4000 UNITS Caps  Take 1 capsule by mouth every morning. Vitamin D 3     docusate sodium 100 MG capsule  Commonly known as:  COLACE  Take 100 mg by mouth every morning.     ferrous sulfate 325 (65 FE) MG tablet  Take 325 mg by mouth 2 (two) times daily with a meal.     LORazepam 0.5 MG tablet  Commonly known as:  ATIVAN  Take 0.5 mg by mouth as needed for anxiety.     metoprolol succinate 25 MG 24 hr tablet  Commonly known as:  TOPROL-XL  Take 25 mg by mouth every morning.     mirtazapine 15 MG disintegrating tablet  Commonly known as:  REMERON SOL-TAB  TAKE 1 TABLET BY MOUTH EVERY NIGHT AT BEDTIME     nicotine 10 MG inhaler  Commonly known as:  NICOTROL  Inhale 1 cartridge (1 continuous puffing total) into the lungs as needed for smoking cessation.     omeprazole 20 MG capsule  Commonly known as:  PRILOSEC  Take 1 capsule (20 mg total) by mouth 2 (two) times daily before a meal.     ondansetron 8 MG disintegrating tablet  Commonly known as:  ZOFRAN-ODT  Take 8 mg by mouth as needed for nausea or vomiting.       PARoxetine 10 MG tablet  Commonly known as:  PAXIL  TAKE 1 TABLET BY MOUTH DAILY FOR DEPRESSION     prednisoLONE 5 MG Tabs tablet  Commonly known as:  MILLIPRED  40 mg (8 tabs) po day 1, then 30 mg on day 2, then '20mg'$  on day 3, then '10mg'$  on day 4     [name] 0.625 MG tablet  Generic drug:  estrogens (conjugated)  TAKE 1 TABLET BY MOUTH DAILY FOR 21 DAYS THEN DO NOT TAKE FOR 7 DAYS     promethazine-codeine 6.25-10 MG/5ML syrup  Commonly known as:  PHENERGAN with CODEINE  Take 5 mLs by mouth every 6 (six) hours as needed for cough.     SIMPLY SLEEP 25 MG tablet  Generic drug:  diphenhydrAMINE  Take 25 mg by mouth at bedtime as needed for sleep.     traZODone 50 MG tablet  Commonly known as:  DESYREL  Take 1 tablet (50 mg total) by mouth at bedtime.       Allergies  Allergen Reactions  . Morphine And Related Other (See Comments)    Hallucinations and combative  . Codeine Nausea Only      The results of significant diagnostics from this hospitalization (including imaging, microbiology, ancillary and laboratory) are listed below for reference.    Significant Diagnostic Studies: Dg Chest 2 View  08/19/2014   CLINICAL DATA:  Recent diagnosis of pneumonia with persistent cough and congestion  EXAM: CHEST - 2 VIEW  COMPARISON:  08/11/2014  FINDINGS: Cardiac shadow is stable. The lungs are well aerated bilaterally. Persistent right basilar changes are noted particularly on the frontal exam. No new focal infiltrate is seen. No bony abnormality is noted.  IMPRESSION: Persistent right basilar changes.   Electronically Signed   By: Inez Catalina M.D.   On: 08/19/2014 12:16   Dg Chest 2 View  08/11/2014   CLINICAL DATA:  Productive cough and fever for the past 4 days, history of tobacco use, throat malignancy, and CVA.  EXAM: CHEST  2 VIEW  COMPARISON:  PA and lateral chest of June 28, 2014.  FINDINGS: The lungs are mildly hyperinflated. The right infrahilar lung markings are  increased. There is small amount of apical pleural thickening bilaterally. The heart and pulmonary vascularity are normal. The mediastinum is normal in width. There is no pleural effusion. The bony thorax exhibits no acute abnormality.  IMPRESSION: COPD with superimposed right lower lobe atelectasis or early pneumonia.  Followup PA and lateral chest X-ray is recommended in 3-4 weeks following trial of antibiotic therapy to ensure resolution and exclude underlying malignancy.   Electronically Signed   By: David  Martinique M.D.   On: 08/11/2014 14:23    Microbiology: Recent Results (from the past 240 hour(s))  Clostridium Difficile by PCR (not at [facility])     Status: None   Collection Time: 08/22/14  6:03 AM  Result Value Ref Range Status   C difficile by pcr NEGATIVE NEGATIVE Final     Labs: Basic Metabolic Panel:  Recent Labs Lab 08/18/14 1519 08/19/14 1130 08/19/14 1846 08/20/14 0608  NA 132* 133*  --  132*  K 4.4 4.0  --  4.1  CL 95* 95*  --  96*  CO2 25 24  --  24  GLUCOSE 73 116*  --  130*  BUN 19 22*  --  19  CREATININE 1.54* 1.64* 1.63* 1.60*  CALCIUM 8.9 8.0*  --  7.6*   Liver Function Tests:  Recent Labs Lab 08/19/14 1130  AST 20  ALT 9*  ALKPHOS 72  BILITOT 0.5  PROT 6.3*  ALBUMIN 3.1*   No results for input(s): LIPASE, AMYLASE in the last 168 hours. No results for input(s): AMMONIA in the last 168 hours. CBC:  Recent Labs Lab 08/18/14 1519 08/19/14 1130 08/19/14 1846 08/20/14 0608 08/22/14 0745  WBC 15.6* 16.0* 12.6* 8.6 16.5*  NEUTROABS 12.4* 15.2*  --   --   --   HGB 11.5* 10.2* 10.5* 10.7* 11.2*  HCT 36.3 32.2* 32.1* 32.4* 34.5*  MCV 97.9 95.8 96.7 94.7 95.0  PLT 350.0 243 314 304 422*   Cardiac Enzymes:  Recent Labs Lab 08/19/14 1140 08/20/14 0608  TROPONINI 0.04* <0.03   BNP: BNP (last 3 results)  Recent Labs  08/21/14 1046  BNP 187.2*    ProBNP (last 3 results) No results for input(s): PROBNP in the last 8760 hours.  CBG: No  results for input(s): GLUCAP in the last 168 hours.     Signed:  Annita Brod  [facility] 08/22/2014, 2:55 PM

## 2014-08-22 NOTE — Assessment & Plan Note (Signed)
Did discuss ER with pt-- she does not want to go to hospital meds changed per orders Pt and daughter instructed to take her to ER if symptoms do not improve or worsen

## 2014-08-22 NOTE — Evaluation (Signed)
Clinical/Bedside Swallow Evaluation Patient Details  Name: Marissa Cruz MRN: [id number] Date of Birth: 04/25/1934  Today's Date: 08/22/2014 Time: SLP Start Time (ACUTE ONLY): 7353 SLP Stop Time (ACUTE ONLY): 0852 SLP Time Calculation (min) (ACUTE ONLY): 17 min  Past Medical History:  Past Medical History  Diagnosis Date  . Chicken pox   . Depression   . Diverticulitis   . GERD (gastroesophageal reflux disease)   . Hypertension   . Kidney disease   . Colon polyp   . Carotid artery disease   . CAP (community acquired pneumonia) 08/19/2014  . Vocal cord cancer ~ 2009    S/P radiation  . 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 Anemia   . History of blood transfusion 1976; 2013    "w/hysterectomy; hip OR"  . TIA (transient ischemic attack) ~ 2009 X 2  . Arthritis     "thumbs, hands" (08/18/2014)  . Chronic lower back pain   . History of gout   . Anxiety   . Age-related macular degeneration, wet, left eye   . Age-related macular degeneration, dry, right eye    Past Surgical History:  Past Surgical History  Procedure Laterality Date  . Abdominal hysterectomy  1976  . Total hip arthroplasty Left 2013  . Carotid endarterectomy Left ~ 2014  . Colonoscopy N/A 01/31/2014    Procedure: COLONOSCOPY;  Surgeon: Ladene Artist, MD;  Location: [facility];  Service: Endoscopy;  Laterality: N/A;  . Mouth floor mass excision  03/2014    "removed cancer underneath my tongue"  . Tonsillectomy  1941  . Appendectomy  1954  . Joint replacement    . Dilation and curettage of uterus    . Cataract extraction w/ intraocular lens  implant, bilateral Bilateral ~ 2009   HPI:  Pt is a 80 y.o. female with PMH of tobacco abuse, GERD, hypertension and stage III-4 chronic kidney disease who week ago was returning from [place] with her family and after they arrived home multiple family members including her had pneumonia. Admitted to ER 6/17. CXR 6/9 revealed RLL atelectasis/  PNA, CXR 6/17 revealed persistent R basilar changes with no new infiltrate. Pt noted to have had vocal fold cancer with radiation in 2009 and oral-mouth cancer with mouth floor mass excision in January 2016. No reports of difficulty swallowing. Bedside swallow eval ordered to aide in ruling out aspiration.   Assessment / Plan / Recommendation Clinical Impression  Pt demonstrated no overt s/s of aspiration at bedside. Swallow appeared timely with adequate hyolaryngeal excursion. Discussed medical hx with pt as there are some risk factors for aspiration. Pt reported no increased difficulty swallowing following radiation in 2009 nor following oral resection in 2016. Pt did describe having a "strong gag reflex" and occasionally feeling choked during meals but reported that this happens after taking a large bite to eat. Aspiration risk at bedside appears low if precautions are followed. Recommend continuing regular diet/ thin liquids, meds whole with liquid. Reviewed reflux precautions with pt- sit upright 30 minutes after meal, alternate food with liquid, small bites/ sips. SLP will sign off at this time. Please re-consult if needs arise.    Aspiration Risk  Mild    Diet Recommendation Age appropriate regular solids;Thin   Medication Administration: Whole meds with liquid Compensations: Slow rate;Small sips/bites;Follow solids with liquid    Other  Recommendations Oral Care Recommendations: Oral care BID   Follow Up Recommendations       Frequency  and Duration        Pertinent Vitals/Pain none    SLP Swallow Goals     Swallow Study Prior Functional Status       General Other Pertinent Information: Pt is a 80 y.o. female with PMH of tobacco abuse, GERD, hypertension and stage III-4 chronic kidney disease who week ago was returning from [place] with her family and after they arrived home multiple family members including her had pneumonia. Admitted to ER 6/17. CXR 6/9 revealed RLL  atelectasis/ PNA, CXR 6/17 revealed persistent R basilar changes with no new infiltrate. Pt noted to have had vocal fold cancer with radiation in 2009 and oral-mouth cancer with mouth floor mass excision in January 2016. No reports of difficulty swallowing. Bedside swallow eval ordered to aide in ruling out aspiration. Type of Study: Bedside swallow evaluation Diet Prior to this Study: Regular;Thin liquids Temperature Spikes Noted: No Respiratory Status: Room air History of Recent Intubation: No Behavior/Cognition: Alert;Cooperative;Pleasant mood Oral Cavity - Dentition: Adequate natural dentition/normal for age Self-Feeding Abilities: Able to feed self Patient Positioning: Upright in bed Baseline Vocal Quality: Hoarse Volitional Cough: Strong Volitional Swallow: Able to elicit    Oral/Motor/Sensory Function Overall Oral Motor/Sensory Function: Appears within functional limits for tasks assessed Labial ROM: Within Functional Limits Labial Symmetry: Within Functional Limits Labial Strength: Within Functional Limits Labial Sensation: Within Functional Limits Lingual ROM: Within Functional Limits Lingual Symmetry: Within Functional Limits Lingual Strength: Within Functional Limits Lingual Sensation: Within Functional Limits   Ice Chips Ice chips: Not tested   Thin Liquid Thin Liquid: Within functional limits Presentation: Cup;Straw    Nectar Thick Nectar Thick Liquid: Not tested   Honey Thick Honey Thick Liquid: Not tested   Puree Puree: Within functional limits Presentation: Self Fed;Spoon   Solid   GO    Solid: Within functional limits Presentation: Fulda, Leon, Vian, CCC-SLP 08/22/2014,8:56 AM  [phone]

## 2014-08-23 ENCOUNTER — Telehealth: Payer: Self-pay

## 2014-08-23 NOTE — Telephone Encounter (Signed)
PCP: Garnet Koyanagi, DO  Admit date: 08/19/2014 Discharge date: 08/22/2014  Reason for admission:  CAP  Recommendations for Outpatient Follow-up:  1. New medication: Zithromax 500 mg by mouth daily 4 days 2. New medication: Prednisone taper 3. New medication: Albuterol inhaler 2 puffs 4 times a day scheduled for the next 2 days, then as needed 4. New medication: Ceclor 500 mg by mouth twice a day times the next 4 days  5. Patient will follow up with her PCP in the next 2 weeks: 6. New medication: Cough medicine when necessary    Hospital follow up:  Tuesday, August 30, 2014 at 11:30 am with Dr. Etter Sjogren  Transition Care Management Follow-up Telephone Call  How have you been since you were released from the hospital? Pt states she is feeling much better.  Still has a cough, but it is much improved.  She denies fever, shortness of breath, and chest tightness.     Do you understand why you were in the hospital? yes   Do you understand the discharge instructions? yes  Items Reviewed:  Medications reviewed: pt is states daughter prepares her medications. Daughter not present at time of call.    Allergies reviewed: yes  Dietary changes reviewed: yes, low sodium heart healthy  Referrals reviewed: n/a   Functional Questionnaire:   Activities of Daily Living (ADLs):   She states they are independent in the following: bathing and hygiene, feeding, continence, grooming, toileting and dressing States they require assistance with the following: mobile via wheelchair   Any transportation issues/concerns?: no, daughter provides transportation   Any patient concerns? no   Confirmed importance and date/time of follow-up visits scheduled: yes   Confirmed with patient if condition begins to worsen call PCP or go to the ER: yes

## 2014-08-30 ENCOUNTER — Encounter: Payer: Self-pay | Admitting: Family Medicine

## 2014-08-30 ENCOUNTER — Ambulatory Visit (INDEPENDENT_AMBULATORY_CARE_PROVIDER_SITE_OTHER): Payer: Medicare Other | Admitting: Family Medicine

## 2014-08-30 ENCOUNTER — Other Ambulatory Visit: Payer: Self-pay

## 2014-08-30 ENCOUNTER — Other Ambulatory Visit: Payer: Self-pay | Admitting: Family Medicine

## 2014-08-30 VITALS — BP 124/70 | HR 106 | Temp 98.4°F | Resp 18 | Ht 66.5 in | Wt 101.0 lb

## 2014-08-30 DIAGNOSIS — J189 Pneumonia, unspecified organism: Secondary | ICD-10-CM | POA: Diagnosis not present

## 2014-08-30 DIAGNOSIS — Z8701 Personal history of pneumonia (recurrent): Secondary | ICD-10-CM | POA: Diagnosis not present

## 2014-08-30 LAB — CBC WITH DIFFERENTIAL/PLATELET
Basophils Absolute: 0.2 10*3/uL — ABNORMAL HIGH (ref 0.0–0.1)
Basophils Relative: 0.9 % (ref 0.0–3.0)
Eosinophils Absolute: 0.3 10*3/uL (ref 0.0–0.7)
Eosinophils Relative: 2 % (ref 0.0–5.0)
HCT: 35.3 % — ABNORMAL LOW (ref 36.0–46.0)
Hemoglobin: 11.1 g/dL — ABNORMAL LOW (ref 12.0–15.0)
Lymphocytes Relative: 12.5 % (ref 12.0–46.0)
Lymphs Abs: 2.1 10*3/uL (ref 0.7–4.0)
MCHC: 31.4 g/dL (ref 30.0–36.0)
MCV: 95.8 fl (ref 78.0–100.0)
Monocytes Absolute: 1.2 10*3/uL — ABNORMAL HIGH (ref 0.1–1.0)
Monocytes Relative: 7.1 % (ref 3.0–12.0)
Neutro Abs: 13.3 10*3/uL — ABNORMAL HIGH (ref 1.4–7.7)
Neutrophils Relative %: 77.5 % — ABNORMAL HIGH (ref 43.0–77.0)
Platelets: 504 10*3/uL — ABNORMAL HIGH (ref 150.0–400.0)
RBC: 3.68 Mil/uL — ABNORMAL LOW (ref 3.87–5.11)
RDW: 15.3 % (ref 11.5–15.5)
WBC: 17.2 10*3/uL — ABNORMAL HIGH (ref 4.0–10.5)

## 2014-08-30 NOTE — Patient Instructions (Signed)

## 2014-08-30 NOTE — Progress Notes (Signed)
Pre visit review using our clinic review tool, if applicable. No additional management support is needed unless otherwise documented below in the visit note. 

## 2014-08-30 NOTE — Progress Notes (Signed)
Patient ID: Marissa Cruz, female    DOB: December 04, 1934  Age: 80 y.o. MRN: [id number]    Subjective:  Subjective HPI Marissa Cruz presents for f/u from hospital for pneumonia-- pt is feeling much better. No complaints.   Review of Systems  Constitutional: Negative for chills, diaphoresis, appetite change, fatigue and unexpected weight change.  Eyes: Negative for pain, redness and visual disturbance.  Respiratory: Negative for cough, chest tightness, shortness of breath and wheezing.   Cardiovascular: Negative for chest pain, palpitations and leg swelling.  Endocrine: Negative for cold intolerance, heat intolerance, polydipsia, polyphagia and polyuria.  Genitourinary: Negative for dysuria, frequency and difficulty urinating.  Neurological: Negative for dizziness, light-headedness, numbness and headaches.  Psychiatric/Behavioral: Negative for dysphoric mood and decreased concentration.    History Past Medical History  Diagnosis Date  . Chicken pox   . Depression   . Diverticulitis   . GERD (gastroesophageal reflux disease)   . Hypertension   . Kidney disease   . Colon polyp   . Carotid artery disease   . CAP (community acquired pneumonia) 08/19/2014  . Vocal cord cancer ~ 2009    S/P radiation  . 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 Anemia   . History of blood transfusion 1976; 2013    "w/hysterectomy; hip OR"  . TIA (transient ischemic attack) ~ 2009 X 2  . Arthritis     "thumbs, hands" (08/18/2014)  . Chronic lower back pain   . History of gout   . Anxiety   . Age-related macular degeneration, wet, left eye   . Age-related macular degeneration, dry, right eye     She has past surgical history that includes Abdominal hysterectomy (1976); Total hip arthroplasty (Left, 2013); Carotid endarterectomy (Left, ~ 2014); Colonoscopy (N/A, 01/31/2014); Mouth floor mass excision (03/2014); Tonsillectomy (1941); Appendectomy (1954); Joint replacement; Dilation  and curettage of uterus; and Cataract extraction w/ intraocular lens  implant, bilateral (Bilateral, ~ 2009).   Her family history includes Hypertension in her father.She reports that she quit smoking 11 days ago. Her smoking use included Cigarettes. She has a 30 pack-year smoking history. She has never used smokeless tobacco. She reports that she drinks about 8.4 oz of alcohol per week. She reports that she does not use illicit drugs.  Current Outpatient Prescriptions on File Prior to Visit  Medication Sig Dispense Refill  . acetaminophen (TYLENOL) 650 MG CR tablet Take 1,300 mg by mouth at bedtime.    Marland Kitchen albuterol (PROVENTIL HFA;VENTOLIN HFA) 108 (90 BASE) MCG/ACT inhaler Inhale 2 puffs into the lungs every 6 (six) hours as needed for wheezing or shortness of breath. 1 Inhaler 2  . alendronate (FOSAMAX) 70 MG tablet Take 1 tablet (70 mg total) by mouth once a week. Take with a full glass of water on an empty stomach. 4 tablet 11  . allopurinol (ZYLOPRIM) 100 MG tablet TAKE 1 TABLET BY MOUTH AT BEDTIME 90 tablet 0  . ALPRAZolam (XANAX) 0.25 MG tablet Take 0.25 mg by mouth as needed for anxiety.    Marland Kitchen amLODipine (NORVASC) 5 MG tablet TAKE 1 TABLET BY MOUTH DAILY FOR HYPEFRTENSION 90 tablet 1  . aspirin 81 MG tablet Take 81 mg by mouth daily.    . Azelastine-Fluticasone (DYMISTA) 137-50 MCG/ACT SUSP Place 1 spray into both nostrils 2 (two) times daily. 23 g 3  . beta carotene w/minerals (OCUVITE) tablet Take 1 tablet by mouth every morning.    . Cholecalciferol 4000 UNITS CAPS Take  1 capsule by mouth every morning. Vitamin D 3    . diphenhydrAMINE (SIMPLY SLEEP) 25 MG tablet Take 25 mg by mouth at bedtime as needed for sleep.    Marland Kitchen docusate sodium (COLACE) 100 MG capsule Take 100 mg by mouth every morning.    . ferrous sulfate 325 (65 FE) MG tablet Take 325 mg by mouth 2 (two) times daily with a meal.     . LORazepam (ATIVAN) 0.5 MG tablet Take 0.5 mg by mouth as needed for anxiety.    . metoprolol  succinate (TOPROL-XL) 25 MG 24 hr tablet Take 25 mg by mouth every morning.    . mirtazapine (REMERON SOL-TAB) 15 MG disintegrating tablet TAKE 1 TABLET BY MOUTH EVERY NIGHT AT BEDTIME 90 tablet 0  . Multiple Vitamins-Minerals (HM MULTIVITAMIN ADULT GUMMY) CHEW Chew 2 tablets by mouth every morning.    Marland Kitchen omeprazole (PRILOSEC) 20 MG capsule Take 1 capsule (20 mg total) by mouth 2 (two) times daily before a meal. 60 capsule 11  . ondansetron (ZOFRAN-ODT) 8 MG disintegrating tablet Take 8 mg by mouth as needed for nausea or vomiting.    Marland Kitchen PARoxetine (PAXIL) 10 MG tablet TAKE 1 TABLET BY MOUTH DAILY FOR DEPRESSION 90 tablet 1  . [name] 0.625 MG tablet TAKE 1 TABLET BY MOUTH DAILY FOR 21 DAYS THEN DO NOT TAKE FOR 7 DAYS 90 tablet 0  . traZODone (DESYREL) 50 MG tablet Take 1 tablet (50 mg total) by mouth at bedtime. 30 tablet 2   No current facility-administered medications on file prior to visit.     Objective:  Objective Physical Exam  Constitutional: She is oriented to person, place, and time. She appears well-developed and well-nourished.  HENT:  Head: Normocephalic and atraumatic.  Eyes: Conjunctivae and EOM are normal.  Neck: Normal range of motion. Neck supple. No JVD present. Carotid bruit is not present. No thyromegaly present.  Cardiovascular: Normal rate, regular rhythm and normal heart sounds.   No murmur heard. Pulmonary/Chest: Effort normal and breath sounds normal. No respiratory distress. She has no wheezes. She has no rales. She exhibits no tenderness.  Musculoskeletal: She exhibits no edema.  Neurological: She is alert and oriented to person, place, and time.  Psychiatric: She has a normal mood and affect. Her behavior is normal.   BP 124/70 mmHg  Pulse 106  Temp(Src) 98.4 F (36.9 C) (Oral)  Resp 18  Ht 5' 6.5" (1.689 m)  Wt 101 lb (45.813 kg)  BMI 16.06 kg/m2  SpO2 97% Wt Readings from Last 3 Encounters:  08/30/14 101 lb (45.813 kg)  08/21/14 101 lb (45.813 kg)    08/18/14 100 lb 3.2 oz (45.45 kg)     Lab Results  Component Value Date   WBC 16.5* 08/22/2014   HGB 11.2* 08/22/2014   HCT 34.5* 08/22/2014   PLT 422* 08/22/2014   GLUCOSE 130* 08/20/2014   CHOL 168 05/24/2014   TRIG 100.0 05/24/2014   HDL 85.30 05/24/2014   LDLDIRECT 73.2 10/21/2013   LDLCALC 63 05/24/2014   ALT 9* 08/19/2014   AST 20 08/19/2014   NA 132* 08/20/2014   K 4.1 08/20/2014   CL 96* 08/20/2014   CREATININE 1.60* 08/20/2014   BUN 19 08/20/2014   CO2 24 08/20/2014   TSH 3.95 05/24/2014    Dg Chest 2 View  08/19/2014   CLINICAL DATA:  Recent diagnosis of pneumonia with persistent cough and congestion  EXAM: CHEST - 2 VIEW  COMPARISON:  08/11/2014  FINDINGS:  Cardiac shadow is stable. The lungs are well aerated bilaterally. Persistent right basilar changes are noted particularly on the frontal exam. No new focal infiltrate is seen. No bony abnormality is noted.  IMPRESSION: Persistent right basilar changes.   Electronically Signed   By: Inez Catalina M.D.   On: 08/19/2014 12:16     Assessment & Plan:  Plan I have discontinued Marissa Cruz's nicotine, prednisoLONE, promethazine-codeine, and cefaclor. I am also having her maintain her metoprolol succinate, ferrous sulfate, docusate sodium, beta carotene w/minerals, aspirin, Cholecalciferol, HM MULTIVITAMIN ADULT GUMMY, acetaminophen, diphenhydrAMINE, ondansetron, ALPRAZolam, LORazepam, omeprazole, traZODone, mirtazapine, alendronate, amLODipine, PARoxetine, Azelastine-Fluticasone, [name], allopurinol, and albuterol.  No orders of the defined types were placed in this encounter.    Problem List Items Addressed This Visit    CAP (community acquired pneumonia)    abx and steroids done Check cbcd F/u 3 months or sooner prn       Other Visit Diagnoses    History of pneumonia    -  Primary    Relevant Orders    CBC with Differential/Platelet       Follow-up: Return in about 3 months (around 11/30/2014), or if  symptoms worsen or fail to improve, for pneumonia f/u.  Garnet Koyanagi, DO

## 2014-08-30 NOTE — Assessment & Plan Note (Signed)
abx and steroids done Check cbcd F/u 3 months or sooner prn

## 2014-08-31 ENCOUNTER — Ambulatory Visit (HOSPITAL_BASED_OUTPATIENT_CLINIC_OR_DEPARTMENT_OTHER)
Admission: RE | Admit: 2014-08-31 | Discharge: 2014-08-31 | Disposition: A | Discharge status: Home / Self Care | Payer: Medicare Other | Source: Ambulatory Visit | Attending: Family Medicine | Admitting: Family Medicine

## 2014-08-31 DIAGNOSIS — F17200 Nicotine dependence, unspecified, uncomplicated: Secondary | ICD-10-CM | POA: Diagnosis not present

## 2014-08-31 DIAGNOSIS — J189 Pneumonia, unspecified organism: Secondary | ICD-10-CM | POA: Insufficient documentation

## 2014-08-31 DIAGNOSIS — J449 Chronic obstructive pulmonary disease, unspecified: Secondary | ICD-10-CM | POA: Diagnosis not present

## 2014-09-03 ENCOUNTER — Other Ambulatory Visit: Payer: Self-pay | Admitting: Family Medicine

## 2014-09-06 NOTE — Telephone Encounter (Signed)
Pt is requesting refill on Trazodone. Dr. Etter Sjogren Pt  Last OV: 08/30/2014  Last Fill: 05/30/2014 #30 2RF  Please advise.

## 2014-09-06 NOTE — Telephone Encounter (Signed)
Sent to pharmacy 

## 2014-09-06 NOTE — Telephone Encounter (Signed)
Okay 30 and 2 refills 

## 2014-10-01 ENCOUNTER — Other Ambulatory Visit: Payer: Self-pay | Admitting: Family Medicine

## 2014-10-03 NOTE — Telephone Encounter (Signed)
Last seen 08/30/14 and filled 06/09/14 #90   Please advise     KP

## 2014-10-13 ENCOUNTER — Other Ambulatory Visit: Payer: Self-pay | Admitting: Family Medicine

## 2014-10-13 NOTE — Telephone Encounter (Signed)
The Trazodone was sent on 09/06/14 #30 with 2 refills, but the patient is requesting 90. Please advise     KP

## 2014-10-24 DIAGNOSIS — D490 Neoplasm of unspecified behavior of digestive system: Secondary | ICD-10-CM | POA: Diagnosis not present

## 2014-10-24 DIAGNOSIS — Z87891 Personal history of nicotine dependence: Secondary | ICD-10-CM | POA: Diagnosis not present

## 2014-10-24 DIAGNOSIS — Z483 Aftercare following surgery for neoplasm: Secondary | ICD-10-CM | POA: Diagnosis not present

## 2014-10-24 DIAGNOSIS — D0006 Carcinoma in situ of floor of mouth: Secondary | ICD-10-CM | POA: Diagnosis not present

## 2014-10-25 DIAGNOSIS — H3531 Nonexudative age-related macular degeneration: Secondary | ICD-10-CM | POA: Diagnosis not present

## 2014-10-25 DIAGNOSIS — H3532 Exudative age-related macular degeneration: Secondary | ICD-10-CM | POA: Diagnosis not present

## 2014-11-03 DIAGNOSIS — Z0100 Encounter for examination of eyes and vision without abnormal findings: Secondary | ICD-10-CM | POA: Diagnosis not present

## 2014-11-03 DIAGNOSIS — H3532 Exudative age-related macular degeneration: Secondary | ICD-10-CM | POA: Diagnosis not present

## 2014-11-03 DIAGNOSIS — Z961 Presence of intraocular lens: Secondary | ICD-10-CM | POA: Diagnosis not present

## 2014-11-08 ENCOUNTER — Other Ambulatory Visit: Payer: Self-pay | Admitting: Family Medicine

## 2014-11-17 ENCOUNTER — Encounter: Payer: Self-pay | Admitting: Family Medicine

## 2014-11-17 ENCOUNTER — Ambulatory Visit (INDEPENDENT_AMBULATORY_CARE_PROVIDER_SITE_OTHER): Payer: Medicare Other | Admitting: Family Medicine

## 2014-11-17 VITALS — BP 130/72 | HR 97 | Temp 98.1°F | Wt 108.4 lb

## 2014-11-17 DIAGNOSIS — I10 Essential (primary) hypertension: Secondary | ICD-10-CM | POA: Diagnosis not present

## 2014-11-17 DIAGNOSIS — Z23 Encounter for immunization: Secondary | ICD-10-CM | POA: Diagnosis not present

## 2014-11-17 LAB — LIPID PANEL
CHOLESTEROL: 177 mg/dL (ref 0–200)
HDL: 92.5 mg/dL (ref >39.00)
LDL CALC: 60 mg/dL (ref 0–99)
NonHDL: 84.93
Total CHOL/HDL Ratio: 2
Triglycerides: 123 mg/dL (ref 0.0–149.0)
VLDL: 24.6 mg/dL (ref 0.0–40.0)

## 2014-11-17 LAB — COMPREHENSIVE METABOLIC PANEL
ALBUMIN: 3.8 g/dL (ref 3.5–5.2)
ALK PHOS: 95 U/L (ref 39–117)
ALT: 13 U/L (ref 0–35)
AST: 20 U/L (ref 0–37)
BUN: 26 mg/dL — ABNORMAL HIGH (ref 6–23)
CALCIUM: 9.3 mg/dL (ref 8.4–10.5)
CHLORIDE: 103 meq/L (ref 96–112)
CO2: 28 mEq/L (ref 19–32)
CREATININE: 1.42 mg/dL — AB (ref 0.40–1.20)
GFR: 38.02 mL/min — ABNORMAL LOW (ref >60.00)
Glucose, Bld: 88 mg/dL (ref 70–99)
Potassium: 4.3 mEq/L (ref 3.5–5.1)
Sodium: 139 mEq/L (ref 135–145)
Total Bilirubin: 0.3 mg/dL (ref 0.2–1.2)
Total Protein: 7.2 g/dL (ref 6.0–8.3)

## 2014-11-17 NOTE — Assessment & Plan Note (Addendum)
con't metoprolol And norvasc rto 6 months

## 2014-11-17 NOTE — Patient Instructions (Signed)

## 2014-11-17 NOTE — Progress Notes (Signed)
Pre visit review using our clinic review tool, if applicable. No additional management support is needed unless otherwise documented below in the visit note. 

## 2014-11-17 NOTE — Progress Notes (Signed)
Patient ID: Marissa Cruz, female    DOB: Apr 01, 1934  Age: 80 y.o. MRN: [id number]    Subjective:  Subjective HPI Marissa Cruz presents for f/u bp and flu shot.  No cp, sob or palp.    Review of Systems  Constitutional: Negative for diaphoresis, appetite change, fatigue and unexpected weight change.  Eyes: Negative for pain, redness and visual disturbance.  Respiratory: Negative for cough, chest tightness, shortness of breath and wheezing.   Cardiovascular: Negative for chest pain, palpitations and leg swelling.  Endocrine: Negative for cold intolerance, heat intolerance, polydipsia, polyphagia and polyuria.  Genitourinary: Negative for dysuria, frequency and difficulty urinating.  Neurological: Negative for dizziness, light-headedness, numbness and headaches.    History Past Medical History  Diagnosis Date  . Chicken pox   . Depression   . Diverticulitis   . GERD (gastroesophageal reflux disease)   . Hypertension   . Kidney disease   . Colon polyp   . Carotid artery disease   . CAP (community acquired pneumonia) 08/19/2014  . Vocal cord cancer ~ 2009    S/P radiation  . 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 Anemia   . History of blood transfusion 1976; 2013    "w/hysterectomy; hip OR"  . TIA (transient ischemic attack) ~ 2009 X 2  . Arthritis     "thumbs, hands" (08/18/2014)  . Chronic lower back pain   . History of gout   . Anxiety   . Age-related macular degeneration, wet, left eye   . Age-related macular degeneration, dry, right eye     She has past surgical history that includes Abdominal hysterectomy (1976); Total hip arthroplasty (Left, 2013); Carotid endarterectomy (Left, ~ 2014); Colonoscopy (N/A, 01/31/2014); Mouth floor mass excision (03/2014); Tonsillectomy (1941); Appendectomy (1954); Joint replacement; Dilation and curettage of uterus; and Cataract extraction w/ intraocular lens  implant, bilateral (Bilateral, ~ 2009).   Her family  history includes Hypertension in her father.She reports that she quit smoking about 2 months ago. Her smoking use included Cigarettes. She has a 30 pack-year smoking history. She has never used smokeless tobacco. She reports that she drinks about 8.4 oz of alcohol per week. She reports that she does not use illicit drugs.  Current Outpatient Prescriptions on File Prior to Visit  Medication Sig Dispense Refill  . acetaminophen (TYLENOL) 650 MG CR tablet Take 1,300 mg by mouth at bedtime.    Marland Kitchen albuterol (PROVENTIL HFA;VENTOLIN HFA) 108 (90 BASE) MCG/ACT inhaler Inhale 2 puffs into the lungs every 6 (six) hours as needed for wheezing or shortness of breath. 1 Inhaler 2  . alendronate (FOSAMAX) 70 MG tablet Take 1 tablet (70 mg total) by mouth once a week. Take with a full glass of water on an empty stomach. 4 tablet 11  . allopurinol (ZYLOPRIM) 100 MG tablet TAKE 1 TABLET BY MOUTH AT BEDTIME 90 tablet 1  . ALPRAZolam (XANAX) 0.25 MG tablet Take 0.25 mg by mouth as needed for anxiety.    Marland Kitchen amLODipine (NORVASC) 5 MG tablet TAKE 1 TABLET BY MOUTH DAILY FOR HYPEFRTENSION 90 tablet 1  . aspirin 81 MG tablet Take 81 mg by mouth daily.    . Azelastine-Fluticasone (DYMISTA) 137-50 MCG/ACT SUSP Place 1 spray into both nostrils 2 (two) times daily. 23 g 3  . beta carotene w/minerals (OCUVITE) tablet Take 1 tablet by mouth every morning.    . Cholecalciferol 4000 UNITS CAPS Take 1 capsule by mouth every morning. Vitamin D 3    .  diphenhydrAMINE (SIMPLY SLEEP) 25 MG tablet Take 25 mg by mouth at bedtime as needed for sleep.    Marland Kitchen docusate sodium (COLACE) 100 MG capsule Take 100 mg by mouth every morning.    . ferrous sulfate 325 (65 FE) MG tablet Take 325 mg by mouth 2 (two) times daily with a meal.     . LORazepam (ATIVAN) 0.5 MG tablet Take 0.5 mg by mouth as needed for anxiety.    . metoprolol succinate (TOPROL-XL) 25 MG 24 hr tablet Take 25 mg by mouth every morning.    . mirtazapine (REMERON SOL-TAB) 15 MG  disintegrating tablet TAKE 1 TABLET BY MOUTH AT BEDTIME 90 tablet 0  . Multiple Vitamins-Minerals (HM MULTIVITAMIN ADULT GUMMY) CHEW Chew 2 tablets by mouth every morning.    Marland Kitchen omeprazole (PRILOSEC) 20 MG capsule Take 1 capsule (20 mg total) by mouth 2 (two) times daily before a meal. 60 capsule 11  . ondansetron (ZOFRAN-ODT) 8 MG disintegrating tablet Take 8 mg by mouth as needed for nausea or vomiting.    Marland Kitchen PARoxetine (PAXIL) 10 MG tablet TAKE 1 TABLET BY MOUTH DAILY FOR DEPRESSION 90 tablet 1  . [name] 0.625 MG tablet TAKE 1 TABLET BY MOUTH DAILY FOR 21 DAYS, THEN DO NOT TAKE FOR 7 DAYS 90 tablet 0  . traZODone (DESYREL) 50 MG tablet TAKE 1 TABLET(50 MG) BY MOUTH AT BEDTIME 90 tablet 2   No current facility-administered medications on file prior to visit.     Objective:  Objective Physical Exam  Constitutional: She is oriented to person, place, and time. She appears well-developed and well-nourished.  HENT:  Head: Normocephalic and atraumatic.  Eyes: Conjunctivae and EOM are normal.  Neck: Normal range of motion. Neck supple. No JVD present. Carotid bruit is not present. No thyromegaly present.  Cardiovascular: Normal rate, regular rhythm and normal heart sounds.   No murmur heard. Pulmonary/Chest: Effort normal and breath sounds normal. No respiratory distress. She has no wheezes. She has no rales. She exhibits no tenderness.  Musculoskeletal: She exhibits no edema.  Neurological: She is alert and oriented to person, place, and time.  Psychiatric: She has a normal mood and affect. Her behavior is normal.   BP 130/72 mmHg  Pulse 97  Temp(Src) 98.1 F (36.7 C) (Oral)  Wt 108 lb 6.4 oz (49.17 kg)  SpO2 95% Wt Readings from Last 3 Encounters:  11/17/14 108 lb 6.4 oz (49.17 kg)  08/30/14 101 lb (45.813 kg)  08/21/14 101 lb (45.813 kg)     Lab Results  Component Value Date   WBC 17.2* 08/30/2014   HGB 11.1* 08/30/2014   HCT 35.3* 08/30/2014   PLT 504.0* 08/30/2014    GLUCOSE 88 11/17/2014   CHOL 177 11/17/2014   TRIG 123.0 11/17/2014   HDL 92.50 11/17/2014   LDLDIRECT 73.2 10/21/2013   LDLCALC 60 11/17/2014   ALT 13 11/17/2014   AST 20 11/17/2014   NA 139 11/17/2014   K 4.3 11/17/2014   CL 103 11/17/2014   CREATININE 1.42* 11/17/2014   BUN 26* 11/17/2014   CO2 28 11/17/2014   TSH 3.95 05/24/2014    Dg Chest 2 View  08/31/2014   CLINICAL DATA:  Pneumonia 08/11/2014.  Smoker.  EXAM: CHEST  2 VIEW  COMPARISON:  08/19/2014  FINDINGS: The lungs are hyperinflated likely secondary to COPD. There is no focal parenchymal opacity. There is no pleural effusion or pneumothorax. The heart and mediastinal contours are unremarkable.  The osseous structures are unremarkable.  IMPRESSION: No active cardiopulmonary disease.   Electronically Signed   By: Kathreen Devoid   On: 08/31/2014 14:19     Assessment & Plan:  Plan I am having Ms. Chenette maintain her metoprolol succinate, ferrous sulfate, docusate sodium, beta carotene w/minerals, aspirin, Cholecalciferol, HM MULTIVITAMIN ADULT GUMMY, acetaminophen, diphenhydrAMINE, ondansetron, ALPRAZolam, LORazepam, omeprazole, alendronate, amLODipine, PARoxetine, Azelastine-Fluticasone, albuterol, mirtazapine, traZODone, [name], and allopurinol.  No orders of the defined types were placed in this encounter.    Problem List Items Addressed This Visit    None    Visit Diagnoses    Essential hypertension    -  Primary    Relevant Orders    Comp Met (CMET) (Completed)    Lipid panel (Completed)    Need for influenza vaccination        Relevant Orders    Flu vaccine HIGH DOSE PF (Fluzone High dose) (Completed)    Need for pneumococcal vaccination        Relevant Orders    Pneumococcal conjugate vaccine 13-valent (Completed)       Follow-up: Return in about 6 months (around 05/17/2015), or if symptoms worsen or fail to improve, for annual exam, fasting.  Garnet Koyanagi, DO

## 2014-12-02 ENCOUNTER — Telehealth: Payer: Self-pay | Admitting: *Deleted

## 2014-12-02 NOTE — Telephone Encounter (Signed)
Home health certification and plan of care received via fax from [facility] for [place] period: 9/47/09-08/30/34. Forwarded to Dr. Etter Sjogren. JG//CMA

## 2014-12-07 NOTE — Telephone Encounter (Signed)
Forms faxed successfully to [facility]. Sent for scanning. JG//CMA

## 2014-12-18 ENCOUNTER — Other Ambulatory Visit: Payer: Self-pay | Admitting: Family Medicine

## 2014-12-27 ENCOUNTER — Other Ambulatory Visit: Payer: Self-pay | Admitting: Family Medicine

## 2014-12-27 ENCOUNTER — Ambulatory Visit (INDEPENDENT_AMBULATORY_CARE_PROVIDER_SITE_OTHER): Payer: Medicare Other | Admitting: Family Medicine

## 2014-12-27 ENCOUNTER — Encounter: Payer: Self-pay | Admitting: Family Medicine

## 2014-12-27 VITALS — BP 126/62 | HR 94 | Temp 98.1°F | Wt 112.0 lb

## 2014-12-27 DIAGNOSIS — I10 Essential (primary) hypertension: Secondary | ICD-10-CM | POA: Diagnosis not present

## 2014-12-27 DIAGNOSIS — R350 Frequency of micturition: Secondary | ICD-10-CM

## 2014-12-27 LAB — POCT URINALYSIS DIPSTICK
BILIRUBIN UA: NEGATIVE
Blood, UA: NEGATIVE
GLUCOSE UA: NEGATIVE
KETONES UA: NEGATIVE
Nitrite, UA: NEGATIVE
Spec Grav, UA: 1.02
UROBILINOGEN UA: 2
pH, UA: 6

## 2014-12-27 MED ORDER — SULFAMETHOXAZOLE-TRIMETHOPRIM 800-160 MG PO TABS: 1.0000 | ORAL_TABLET | Freq: Two times a day (BID) | ORAL | Status: DC

## 2014-12-27 MED ORDER — METOPROLOL SUCCINATE ER 25 MG PO TB24: 25.0000 mg | ORAL_TABLET | Freq: Every morning | ORAL | Status: DC

## 2014-12-27 NOTE — Progress Notes (Signed)
Patient ID: Marissa Cruz, female    DOB: 1934/05/25  Age: 80 y.o. MRN: [id number]    Subjective:  Subjective HPI Marissa Cruz presents for urinary frequency.  No dysuria, no blood.  No abd pain.    Review of Systems  Constitutional: Negative for diaphoresis, appetite change, fatigue and unexpected weight change.  Eyes: Negative for pain, redness and visual disturbance.  Respiratory: Negative for cough, chest tightness, shortness of breath and wheezing.   Cardiovascular: Negative for chest pain, palpitations and leg swelling.  Endocrine: Negative for cold intolerance, heat intolerance, polydipsia, polyphagia and polyuria.  Genitourinary: Positive for frequency. Negative for dysuria, urgency, hematuria, decreased urine volume and difficulty urinating.  Neurological: Negative for dizziness, light-headedness, numbness and headaches.    History Past Medical History  Diagnosis Date  . Chicken pox   . Depression   . Diverticulitis   . GERD (gastroesophageal reflux disease)   . Hypertension   . Kidney disease   . Colon polyp   . Carotid artery disease ([place])   . CAP (community acquired pneumonia) 08/19/2014  . Vocal cord cancer ([place]) ~ 2009    S/P radiation  . 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 Anemia   . History of blood transfusion 1976; 2013    "w/hysterectomy; hip OR"  . TIA (transient ischemic attack) ~ 2009 X 2  . Arthritis     "thumbs, hands" (08/18/2014)  . Chronic lower back pain   . History of gout   . Anxiety   . Age-related macular degeneration, wet, left eye ([place])   . Age-related macular degeneration, dry, right eye     She has past surgical history that includes Abdominal hysterectomy (1976); Total hip arthroplasty (Left, 2013); Carotid endarterectomy (Left, ~ 2014); Colonoscopy (N/A, 01/31/2014); Mouth floor mass excision (03/2014); Tonsillectomy (1941); Appendectomy (1954); Joint replacement; Dilation and curettage of uterus; and  Cataract extraction w/ intraocular lens  implant, bilateral (Bilateral, ~ 2009).   Her family history includes Hypertension in her father.She reports that she quit smoking about 4 months ago. Her smoking use included Cigarettes. She has a 30 pack-year smoking history. She has never used smokeless tobacco. She reports that she drinks about 8.4 oz of alcohol per week. She reports that she does not use illicit drugs.  Current Outpatient Prescriptions on File Prior to Visit  Medication Sig Dispense Refill  . acetaminophen (TYLENOL) 650 MG CR tablet Take 1,300 mg by mouth at bedtime.    Marland Kitchen albuterol (PROVENTIL HFA;VENTOLIN HFA) 108 (90 BASE) MCG/ACT inhaler Inhale 2 puffs into the lungs every 6 (six) hours as needed for wheezing or shortness of breath. 1 Inhaler 2  . alendronate (FOSAMAX) 70 MG tablet Take 1 tablet (70 mg total) by mouth once a week. Take with a full glass of water on an empty stomach. 4 tablet 11  . allopurinol (ZYLOPRIM) 100 MG tablet TAKE 1 TABLET BY MOUTH AT BEDTIME 90 tablet 1  . ALPRAZolam (XANAX) 0.25 MG tablet Take 0.25 mg by mouth as needed for anxiety.    Marland Kitchen amLODipine (NORVASC) 5 MG tablet TAKE 1 TABLET BY MOUTH DAILY FOR HYPERTENSION 90 tablet 1  . aspirin 81 MG tablet Take 81 mg by mouth daily.    . Azelastine-Fluticasone (DYMISTA) 137-50 MCG/ACT SUSP Place 1 spray into both nostrils 2 (two) times daily. 23 g 3  . beta carotene w/minerals (OCUVITE) tablet Take 1 tablet by mouth every morning.    . Cholecalciferol 4000 UNITS  CAPS Take 1 capsule by mouth every morning. Vitamin D 3    . diphenhydrAMINE (SIMPLY SLEEP) 25 MG tablet Take 25 mg by mouth at bedtime as needed for sleep.    Marland Kitchen docusate sodium (COLACE) 100 MG capsule Take 100 mg by mouth every morning.    . ferrous sulfate 325 (65 FE) MG tablet Take 325 mg by mouth 2 (two) times daily with a meal.     . LORazepam (ATIVAN) 0.5 MG tablet Take 0.5 mg by mouth as needed for anxiety.    . mirtazapine (REMERON SOL-TAB) 15 MG  disintegrating tablet TAKE 1 TABLET BY MOUTH AT BEDTIME 90 tablet 0  . Multiple Vitamins-Minerals (HM MULTIVITAMIN ADULT GUMMY) CHEW Chew 2 tablets by mouth every morning.    Marland Kitchen omeprazole (PRILOSEC) 20 MG capsule Take 1 capsule (20 mg total) by mouth 2 (two) times daily before a meal. 60 capsule 11  . ondansetron (ZOFRAN-ODT) 8 MG disintegrating tablet Take 8 mg by mouth as needed for nausea or vomiting.    Marland Kitchen PARoxetine (PAXIL) 10 MG tablet TAKE 1 TABLET BY MOUTH DAILY FOR DEPRESSION 90 tablet 1  . [name] 0.625 MG tablet TAKE 1 TABLET BY MOUTH DAILY FOR 21 DAYS, THEN DO NOT TAKE FOR 7 DAYS 90 tablet 0  . traZODone (DESYREL) 50 MG tablet TAKE 1 TABLET(50 MG) BY MOUTH AT BEDTIME 90 tablet 2   No current facility-administered medications on file prior to visit.     Objective:  Objective Physical Exam  Constitutional: She appears well-developed and well-nourished. No distress.  Eyes: Right eye exhibits discharge.  Abdominal: Soft. She exhibits no distension. There is no tenderness (no suprapubic or CVA tenderness). There is no rebound and no guarding.  Psychiatric: She has a normal mood and affect. Her behavior is normal. Judgment and thought content normal.  Nursing note and vitals reviewed.  BP 126/62 mmHg  Pulse 94  Temp(Src) 98.1 F (36.7 C) (Oral)  Wt 112 lb (50.803 kg)  SpO2 96% Wt Readings from Last 3 Encounters:  12/27/14 112 lb (50.803 kg)  11/17/14 108 lb 6.4 oz (49.17 kg)  08/30/14 101 lb (45.813 kg)     Lab Results  Component Value Date   WBC 17.2* 08/30/2014   HGB 11.1* 08/30/2014   HCT 35.3* 08/30/2014   PLT 504.0* 08/30/2014   GLUCOSE 88 11/17/2014   CHOL 177 11/17/2014   TRIG 123.0 11/17/2014   HDL 92.50 11/17/2014   LDLDIRECT 73.2 10/21/2013   LDLCALC 60 11/17/2014   ALT 13 11/17/2014   AST 20 11/17/2014   NA 139 11/17/2014   K 4.3 11/17/2014   CL 103 11/17/2014   CREATININE 1.42* 11/17/2014   BUN 26* 11/17/2014   CO2 28 11/17/2014   TSH 3.95  05/24/2014    Dg Chest 2 View  08/31/2014  CLINICAL DATA:  Pneumonia 08/11/2014.  Smoker. EXAM: CHEST  2 VIEW COMPARISON:  08/19/2014 FINDINGS: The lungs are hyperinflated likely secondary to COPD. There is no focal parenchymal opacity. There is no pleural effusion or pneumothorax. The heart and mediastinal contours are unremarkable. The osseous structures are unremarkable. IMPRESSION: No active cardiopulmonary disease. Electronically Signed   By: Kathreen Devoid   On: 08/31/2014 14:19     Assessment & Plan:  Plan I have changed Ms. Bloomquist's metoprolol succinate. I am also having her start on sulfamethoxazole-trimethoprim. Additionally, I am having her maintain her ferrous sulfate, docusate sodium, beta carotene w/minerals, aspirin, Cholecalciferol, HM MULTIVITAMIN ADULT GUMMY, acetaminophen, diphenhydrAMINE, ondansetron, ALPRAZolam,  LORazepam, omeprazole, alendronate, Azelastine-Fluticasone, albuterol, mirtazapine, traZODone, [name], allopurinol, PARoxetine, and amLODipine.  Meds ordered this encounter  Medications  . metoprolol succinate (TOPROL-XL) 25 MG 24 hr tablet    Sig: Take 1 tablet (25 mg total) by mouth every morning.    Dispense:  90 tablet    Refill:  1  . sulfamethoxazole-trimethoprim (BACTRIM DS,SEPTRA DS) 800-160 MG tablet    Sig: Take 1 tablet by mouth 2 (two) times daily.    Dispense:  14 tablet    Refill:  0    Problem List Items Addressed This Visit    None    Visit Diagnoses    Essential hypertension    -  Primary    Relevant Medications    metoprolol succinate (TOPROL-XL) 25 MG 24 hr tablet    Frequency of urination        Relevant Medications    sulfamethoxazole-trimethoprim (BACTRIM DS,SEPTRA DS) 800-160 MG tablet    Other Relevant Orders    POCT Urinalysis Dipstick (Completed)    Urine Culture       Follow-up: Return if symptoms worsen or fail to improve.  Garnet Koyanagi, DO

## 2014-12-27 NOTE — Progress Notes (Signed)
Pre visit review using our clinic review tool, if applicable. No additional management support is needed unless otherwise documented below in the visit note. 

## 2014-12-27 NOTE — Patient Instructions (Signed)

## 2014-12-30 LAB — URINE CULTURE

## 2015-01-05 ENCOUNTER — Other Ambulatory Visit: Payer: Self-pay | Admitting: Family Medicine

## 2015-01-05 NOTE — Telephone Encounter (Signed)
Please advise if this refill is appropriate.     KP

## 2015-01-30 ENCOUNTER — Encounter: Payer: Self-pay | Admitting: Medical

## 2015-01-30 ENCOUNTER — Ambulatory Visit (INDEPENDENT_AMBULATORY_CARE_PROVIDER_SITE_OTHER): Payer: Medicare Other | Admitting: Medical

## 2015-01-30 ENCOUNTER — Ambulatory Visit (HOSPITAL_BASED_OUTPATIENT_CLINIC_OR_DEPARTMENT_OTHER)
Admission: RE | Admit: 2015-01-30 | Discharge: 2015-01-30 | Disposition: A | Discharge status: Home / Self Care | Payer: Medicare Other | Source: Ambulatory Visit | Attending: Medical | Admitting: Medical

## 2015-01-30 VITALS — BP 120/76 | HR 67 | Temp 98.0°F | Ht 66.5 in | Wt 112.8 lb

## 2015-01-30 DIAGNOSIS — D649 Anemia, unspecified: Secondary | ICD-10-CM

## 2015-01-30 DIAGNOSIS — R0781 Pleurodynia: Secondary | ICD-10-CM

## 2015-01-30 DIAGNOSIS — I10 Essential (primary) hypertension: Secondary | ICD-10-CM | POA: Diagnosis not present

## 2015-01-30 DIAGNOSIS — I2510 Atherosclerotic heart disease of native coronary artery without angina pectoris: Secondary | ICD-10-CM | POA: Insufficient documentation

## 2015-01-30 DIAGNOSIS — R1012 Left upper quadrant pain: Secondary | ICD-10-CM | POA: Diagnosis not present

## 2015-01-30 MED ORDER — TRAMADOL HCL 50 MG PO TABS: 50.0000 mg | ORAL_TABLET | Freq: Two times a day (BID) | ORAL | Status: DC | PRN

## 2015-01-30 NOTE — Patient Instructions (Addendum)
For your left upper quadrant region pain will get lab work.  Also for left lower rib area pain will get xray and make sure no left lower lobe consolidation causing the pain.  Use tylenol currently for pain. If pain more severe and constant then can use tramadol. But notify us if constant or if symptoms change.  Follow up in 7 days or as needed.

## 2015-01-30 NOTE — Progress Notes (Signed)
Subjective:    Patient ID: Marissa Cruz, female    DOB: 10/07/1934, 80 y.o.   MRN: [id number]  HPI  Pt in with some left upper quadrant/lower rib region pain since Friday. Pt describes sharp pain worse with movement. No cough, no fever or chills. No pain after she eats. No diarrhea. No fall or trauma. Pain level is 8/10. But last for only a second or so(if seated with no movement or twisting of thorax has no pain). She will feel pain when twists or moves.No pain on deep breathing.   Review of Systems  Constitutional: Negative for fever, chills and fatigue.  Respiratory: Negative for cough, choking, shortness of breath and wheezing.   Cardiovascular: Negative for chest pain and palpitations.  Genitourinary: Negative for dysuria.  Musculoskeletal: Negative for back pain.       Left lower rib area pain. Left upper quadrant pain.  Skin: Negative for rash.  Hematological: Negative for adenopathy.  Psychiatric/Behavioral: Negative for suicidal ideas, behavioral problems, dysphoric mood and decreased concentration. The patient is not nervous/anxious.     Past Medical History  Diagnosis Date  . Chicken pox   . Depression   . Diverticulitis   . GERD (gastroesophageal reflux disease)   . Hypertension   . Kidney disease   . Colon polyp   . Carotid artery disease ([place])   . CAP (community acquired pneumonia) 08/19/2014  . Vocal cord cancer ([place]) ~ 2009    S/P radiation  . 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 Anemia   . History of blood transfusion 1976; 2013    "w/hysterectomy; hip OR"  . TIA (transient ischemic attack) ~ 2009 X 2  . Arthritis     "thumbs, hands" (08/18/2014)  . Chronic lower back pain   . History of gout   . Anxiety   . Age-related macular degeneration, wet, left eye ([place])   . Age-related macular degeneration, dry, right eye     Social History   Social History  . Marital Status: Widowed    Spouse Name: N/A  . Number of  Children: 1  . Years of Education: N/A   Occupational History  . Retired    Social History Main Topics  . Smoking status: Former Smoker -- 0.50 packs/day for 60 years    Types: Cigarettes    Quit date: 08/19/2014  . Smokeless tobacco: Never Used  . Alcohol Use: 8.4 oz/week    14 Shots of liquor, 0 Standard drinks or equivalent per week     Comment: 08/19/2014 "2 shots brandy q night"  . Drug Use: No  . Sexual Activity: Not Currently   Other Topics Concern  . Not on file   Social History Narrative    Past Surgical History  Procedure Laterality Date  . Abdominal hysterectomy  1976  . Total hip arthroplasty Left 2013  . Carotid endarterectomy Left ~ 2014  . Colonoscopy N/A 01/31/2014    Procedure: COLONOSCOPY;  Surgeon: Ladene Artist, MD;  Location: [facility];  Service: Endoscopy;  Laterality: N/A;  . Mouth floor mass excision  03/2014    "removed cancer underneath my tongue"  . Tonsillectomy  1941  . Appendectomy  1954  . Joint replacement    . Dilation and curettage of uterus    . Cataract extraction w/ intraocular lens  implant, bilateral Bilateral ~ 2009    Family History  Problem Relation Age of Onset  . Hypertension Father  Allergies  Allergen Reactions  . Morphine And Related Other (See Comments)    Hallucinations and combative  . Codeine Nausea Only    Current Outpatient Prescriptions on File Prior to Visit  Medication Sig Dispense Refill  . acetaminophen (TYLENOL) 650 MG CR tablet Take 1,300 mg by mouth at bedtime.    Marland Kitchen albuterol (PROVENTIL HFA;VENTOLIN HFA) 108 (90 BASE) MCG/ACT inhaler Inhale 2 puffs into the lungs every 6 (six) hours as needed for wheezing or shortness of breath. 1 Inhaler 2  . alendronate (FOSAMAX) 70 MG tablet Take 1 tablet (70 mg total) by mouth once a week. Take with a full glass of water on an empty stomach. 4 tablet 11  . allopurinol (ZYLOPRIM) 100 MG tablet TAKE 1 TABLET BY MOUTH AT BEDTIME 90 tablet 1  . ALPRAZolam  (XANAX) 0.25 MG tablet Take 0.25 mg by mouth as needed for anxiety.    Marland Kitchen amLODipine (NORVASC) 5 MG tablet TAKE 1 TABLET BY MOUTH DAILY FOR HYPERTENSION 90 tablet 1  . aspirin 81 MG tablet Take 81 mg by mouth daily.    . Azelastine-Fluticasone (DYMISTA) 137-50 MCG/ACT SUSP Place 1 spray into both nostrils 2 (two) times daily. 23 g 3  . beta carotene w/minerals (OCUVITE) tablet Take 1 tablet by mouth every morning.    . Cholecalciferol 4000 UNITS CAPS Take 1 capsule by mouth every morning. Vitamin D 3    . diphenhydrAMINE (SIMPLY SLEEP) 25 MG tablet Take 25 mg by mouth at bedtime as needed for sleep.    Marland Kitchen docusate sodium (COLACE) 100 MG capsule Take 100 mg by mouth every morning.    . ferrous sulfate 325 (65 FE) MG tablet Take 325 mg by mouth 2 (two) times daily with a meal.     . LORazepam (ATIVAN) 0.5 MG tablet Take 0.5 mg by mouth as needed for anxiety.    . metoprolol succinate (TOPROL-XL) 25 MG 24 hr tablet Take 1 tablet (25 mg total) by mouth every morning. 90 tablet 1  . mirtazapine (REMERON SOL-TAB) 15 MG disintegrating tablet DISSOLVE 1 TABLET ON THE TONGUE AT BEDTIME 90 tablet 0  . Multiple Vitamins-Minerals (HM MULTIVITAMIN ADULT GUMMY) CHEW Chew 2 tablets by mouth every morning.    Marland Kitchen omeprazole (PRILOSEC) 20 MG capsule Take 1 capsule (20 mg total) by mouth 2 (two) times daily before a meal. 60 capsule 11  . ondansetron (ZOFRAN-ODT) 8 MG disintegrating tablet Take 8 mg by mouth as needed for nausea or vomiting.    Marland Kitchen PARoxetine (PAXIL) 10 MG tablet TAKE 1 TABLET BY MOUTH DAILY FOR DEPRESSION 90 tablet 1  . [name] 0.625 MG tablet TAKE 1 TABLET BY MOUTH DAILY FOR 21 DAYS, THEN DO NOT TAKE FOR 7 DAYS 90 tablet 0  . [name] 0.625 MG tablet TAKE 1 TABLET BY MOUTH DAILY FOR 21 DAYS, THEN DO NOT TAKE FOR 7 DAYS 90 tablet 0  . sulfamethoxazole-trimethoprim (BACTRIM DS,SEPTRA DS) 800-160 MG tablet Take 1 tablet by mouth 2 (two) times daily. 14 tablet 0  . traZODone (DESYREL) 50 MG tablet TAKE 1  TABLET(50 MG) BY MOUTH AT BEDTIME 90 tablet 2   No current facility-administered medications on file prior to visit.    BP 120/76 mmHg  Pulse 67  Temp(Src) 98 F (36.7 C) (Oral)  Ht 5' 6.5" (1.689 m)  Wt 112 lb 12.8 oz (51.166 kg)  BMI 17.94 kg/m2  SpO2 96%       Objective:   Physical Exam  General- No acute  distress. Pleasant patient.(no pain during exam except for brief one second pain when she adjusted her seated position) Neck- Full range of motion, no jvd Lungs- Clear, even and unlabored. Heart- regular rate and rhythm. Neurologic- CNII- XII grossly intact. Anterior thorax- no pain on palpation of left lower ribs or left upper quadrant. Derm- no rash on abdomen or thorax.      Assessment & Plan:  Prior use tramadol and no reaction. For your left upper quadrant region pain will get lab work.  Also for left lower rib area pain will get xray and make sure no left lower lobe consolidation causing the pain.  Use tylenol currently for pain. If pain more severe and constant then can use tramadol. But notify us if constant or if symptoms change.  Follow up in 7 days or as needed.

## 2015-01-30 NOTE — Progress Notes (Signed)
Pre visit review using our clinic review tool, if applicable. No additional management support is needed unless otherwise documented below in the visit note. 

## 2015-01-31 ENCOUNTER — Telehealth: Payer: Self-pay | Admitting: Medical

## 2015-01-31 DIAGNOSIS — D649 Anemia, unspecified: Secondary | ICD-10-CM

## 2015-01-31 LAB — CBC WITH DIFFERENTIAL/PLATELET
BASOS ABS: 0.5 10*3/uL — AB (ref 0.0–0.1)
Basophils Relative: 3.9 % — ABNORMAL HIGH (ref 0.0–3.0)
EOS ABS: 0.2 10*3/uL (ref 0.0–0.7)
Eosinophils Relative: 1.4 % (ref 0.0–5.0)
HEMATOCRIT: 31.9 % — AB (ref 36.0–46.0)
Hemoglobin: 9.9 g/dL — ABNORMAL LOW (ref 12.0–15.0)
Lymphocytes Relative: 13 % (ref 12.0–46.0)
Lymphs Abs: 1.6 10*3/uL (ref 0.7–4.0)
MCHC: 31.1 g/dL (ref 30.0–36.0)
MCV: 97.6 fl (ref 78.0–100.0)
Monocytes Absolute: 0.7 10*3/uL (ref 0.1–1.0)
Monocytes Relative: 6 % (ref 3.0–12.0)
NEUTROS ABS: 9.4 10*3/uL — AB (ref 1.4–7.7)
NEUTROS PCT: 75.7 % (ref 43.0–77.0)
PLATELETS: 552 10*3/uL — AB (ref 150.0–400.0)
RBC: 3.27 Mil/uL — AB (ref 3.87–5.11)
RDW: 15.1 % (ref 11.5–15.5)
WBC: 12.4 10*3/uL — ABNORMAL HIGH (ref 4.0–10.5)

## 2015-01-31 LAB — AMYLASE: Amylase: 65 U/L (ref 27–131)

## 2015-01-31 LAB — COMPREHENSIVE METABOLIC PANEL
ALT: 11 U/L (ref 0–35)
AST: 19 U/L (ref 0–37)
Albumin: 3.7 g/dL (ref 3.5–5.2)
Alkaline Phosphatase: 93 U/L (ref 39–117)
BILIRUBIN TOTAL: 0.2 mg/dL (ref 0.2–1.2)
BUN: 24 mg/dL — ABNORMAL HIGH (ref 6–23)
CALCIUM: 9.4 mg/dL (ref 8.4–10.5)
CO2: 30 mEq/L (ref 19–32)
CREATININE: 1.49 mg/dL — AB (ref 0.40–1.20)
Chloride: 102 mEq/L (ref 96–112)
GFR: 35.95 mL/min — ABNORMAL LOW (ref >60.00)
GLUCOSE: 77 mg/dL (ref 70–99)
Potassium: 5 mEq/L (ref 3.5–5.1)
Sodium: 140 mEq/L (ref 135–145)
Total Protein: 6.8 g/dL (ref 6.0–8.3)

## 2015-01-31 LAB — LIPASE: Lipase: 24 U/L (ref 11.0–59.0)

## 2015-01-31 NOTE — Telephone Encounter (Signed)
I  placed orders for  labs to be done this Friday.

## 2015-02-03 DIAGNOSIS — D649 Anemia, unspecified: Secondary | ICD-10-CM | POA: Diagnosis not present

## 2015-02-03 LAB — CBC WITH DIFFERENTIAL/PLATELET
Basophils Absolute: 0 10*3/uL (ref 0.0–0.1)
Basophils Relative: 0.3 % (ref 0.0–3.0)
Eosinophils Absolute: 0.3 10*3/uL (ref 0.0–0.7)
Eosinophils Relative: 2.3 % (ref 0.0–5.0)
HCT: 33.5 % — ABNORMAL LOW (ref 36.0–46.0)
Hemoglobin: 10.4 g/dL — ABNORMAL LOW (ref 12.0–15.0)
LYMPHS ABS: 2 10*3/uL (ref 0.7–4.0)
Lymphocytes Relative: 15 % (ref 12.0–46.0)
MCHC: 31.1 g/dL (ref 30.0–36.0)
MCV: 96.6 fl (ref 78.0–100.0)
MONO ABS: 0.8 10*3/uL (ref 0.1–1.0)
Monocytes Relative: 5.8 % (ref 3.0–12.0)
NEUTROS PCT: 76.6 % (ref 43.0–77.0)
Neutro Abs: 10 10*3/uL — ABNORMAL HIGH (ref 1.4–7.7)
Platelets: 553 10*3/uL — ABNORMAL HIGH (ref 150.0–400.0)
RBC: 3.47 Mil/uL — AB (ref 3.87–5.11)
RDW: 15 % (ref 11.5–15.5)
WBC: 13.1 10*3/uL — ABNORMAL HIGH (ref 4.0–10.5)

## 2015-02-03 LAB — IRON AND TIBC
%SAT: 22 % (ref 11–50)
Iron: 91 ug/dL (ref 45–160)
TIBC: 414 ug/dL (ref 250–450)
UIBC: 323 ug/dL (ref 125–400)

## 2015-02-03 LAB — FERRITIN: FERRITIN: 17.4 ng/mL (ref 10.0–291.0)

## 2015-02-03 NOTE — Addendum Note (Signed)
Addended by: Peggyann Shoals on: 02/03/2015 12:39 PM   Modules accepted: Orders

## 2015-02-17 ENCOUNTER — Other Ambulatory Visit (INDEPENDENT_AMBULATORY_CARE_PROVIDER_SITE_OTHER): Payer: Medicare Other

## 2015-02-17 DIAGNOSIS — D649 Anemia, unspecified: Secondary | ICD-10-CM | POA: Diagnosis not present

## 2015-02-17 LAB — FECAL OCCULT BLOOD, IMMUNOCHEMICAL: Fecal Occult Bld: NEGATIVE

## 2015-03-19 ENCOUNTER — Other Ambulatory Visit: Payer: Self-pay | Admitting: Family Medicine

## 2015-03-27 ENCOUNTER — Other Ambulatory Visit: Payer: Self-pay | Admitting: Family Medicine

## 2015-03-27 NOTE — Telephone Encounter (Signed)
Last seen 12/27/14 and filled 12/28/14 #90  Please advise    KP

## 2015-04-04 ENCOUNTER — Other Ambulatory Visit: Payer: Self-pay | Admitting: Family Medicine

## 2015-04-05 NOTE — Telephone Encounter (Signed)
Rx sent 01/05/15 #90 with no refills. Please advise  If this refill is appropriate.    KP

## 2015-04-11 DIAGNOSIS — B351 Tinea unguium: Secondary | ICD-10-CM | POA: Diagnosis not present

## 2015-04-11 DIAGNOSIS — L821 Other seborrheic keratosis: Secondary | ICD-10-CM | POA: Diagnosis not present

## 2015-04-11 DIAGNOSIS — Z411 Encounter for cosmetic surgery: Secondary | ICD-10-CM | POA: Diagnosis not present

## 2015-04-11 DIAGNOSIS — D229 Melanocytic nevi, unspecified: Secondary | ICD-10-CM | POA: Diagnosis not present

## 2015-04-11 DIAGNOSIS — L720 Epidermal cyst: Secondary | ICD-10-CM | POA: Diagnosis not present

## 2015-04-29 ENCOUNTER — Other Ambulatory Visit: Payer: Self-pay | Admitting: Family Medicine

## 2015-05-01 NOTE — Telephone Encounter (Signed)
Last seen 12/27/14 and Uric Acid done 10/21/13.  Please advise      KP

## 2015-05-17 ENCOUNTER — Telehealth: Payer: Self-pay | Admitting: Behavioral Health

## 2015-05-17 ENCOUNTER — Encounter: Payer: Self-pay | Admitting: Behavioral Health

## 2015-05-17 NOTE — Telephone Encounter (Signed)
Pre-Visit Call completed with patient and chart updated.   Pre-Visit Info documented in Specialty Comments under [employer].    

## 2015-05-18 ENCOUNTER — Ambulatory Visit (INDEPENDENT_AMBULATORY_CARE_PROVIDER_SITE_OTHER): Payer: Medicare Other | Admitting: Family Medicine

## 2015-05-18 ENCOUNTER — Encounter: Payer: Self-pay | Admitting: Family Medicine

## 2015-05-18 VITALS — BP 134/76 | HR 98 | Temp 97.9°F | Ht 67.0 in | Wt 116.8 lb

## 2015-05-18 DIAGNOSIS — Z0000 Encounter for general adult medical examination without abnormal findings: Secondary | ICD-10-CM

## 2015-05-18 DIAGNOSIS — I10 Essential (primary) hypertension: Secondary | ICD-10-CM | POA: Diagnosis not present

## 2015-05-18 DIAGNOSIS — R8299 Other abnormal findings in urine: Secondary | ICD-10-CM | POA: Diagnosis not present

## 2015-05-18 DIAGNOSIS — R82998 Other abnormal findings in urine: Secondary | ICD-10-CM

## 2015-05-18 LAB — LIPID PANEL
Cholesterol: 191 mg/dL (ref 0–200)
HDL: 79.3 mg/dL (ref >39.00)
LDL Cholesterol: 80 mg/dL (ref 0–99)
NonHDL: 111.35
TRIGLYCERIDES: 156 mg/dL — AB (ref 0.0–149.0)
Total CHOL/HDL Ratio: 2
VLDL: 31.2 mg/dL (ref 0.0–40.0)

## 2015-05-18 LAB — COMPREHENSIVE METABOLIC PANEL
ALT: 12 U/L (ref 0–35)
AST: 20 U/L (ref 0–37)
Albumin: 3.9 g/dL (ref 3.5–5.2)
Alkaline Phosphatase: 95 U/L (ref 39–117)
BILIRUBIN TOTAL: 0.3 mg/dL (ref 0.2–1.2)
BUN: 26 mg/dL — AB (ref 6–23)
CHLORIDE: 99 meq/L (ref 96–112)
CO2: 29 mEq/L (ref 19–32)
CREATININE: 1.59 mg/dL — AB (ref 0.40–1.20)
Calcium: 9.5 mg/dL (ref 8.4–10.5)
GFR: 33.33 mL/min — ABNORMAL LOW (ref >60.00)
Glucose, Bld: 95 mg/dL (ref 70–99)
Potassium: 3.7 mEq/L (ref 3.5–5.1)
SODIUM: 136 meq/L (ref 135–145)
Total Protein: 7.5 g/dL (ref 6.0–8.3)

## 2015-05-18 LAB — POCT URINALYSIS DIPSTICK
BILIRUBIN UA: NEGATIVE
GLUCOSE UA: NEGATIVE
Ketones, UA: NEGATIVE
Nitrite, UA: NEGATIVE
PH UA: 5.5
RBC UA: NEGATIVE
Spec Grav, UA: 1.02
Urobilinogen, UA: 0.2

## 2015-05-18 LAB — CBC WITH DIFFERENTIAL/PLATELET
BASOS ABS: 0.1 10*3/uL (ref 0.0–0.1)
BASOS PCT: 1 % (ref 0.0–3.0)
Eosinophils Absolute: 0.2 10*3/uL (ref 0.0–0.7)
Eosinophils Relative: 1.4 % (ref 0.0–5.0)
HCT: 34.5 % — ABNORMAL LOW (ref 36.0–46.0)
Hemoglobin: 11 g/dL — ABNORMAL LOW (ref 12.0–15.0)
LYMPHS ABS: 1.6 10*3/uL (ref 0.7–4.0)
Lymphocytes Relative: 11.8 % — ABNORMAL LOW (ref 12.0–46.0)
MCHC: 31.9 g/dL (ref 30.0–36.0)
MCV: 94.1 fl (ref 78.0–100.0)
Monocytes Absolute: 0.8 10*3/uL (ref 0.1–1.0)
Monocytes Relative: 5.6 % (ref 3.0–12.0)
NEUTROS ABS: 10.9 10*3/uL — AB (ref 1.4–7.7)
NEUTROS PCT: 80.2 % — AB (ref 43.0–77.0)
Platelets: 491 10*3/uL — ABNORMAL HIGH (ref 150.0–400.0)
RBC: 3.67 Mil/uL — ABNORMAL LOW (ref 3.87–5.11)
RDW: 15.6 % — AB (ref 11.5–15.5)
WBC: 13.6 10*3/uL — ABNORMAL HIGH (ref 4.0–10.5)

## 2015-05-18 NOTE — Patient Instructions (Signed)
Preventive Care for Adults, Female A healthy lifestyle and preventive care can promote health and wellness. Preventive health guidelines for women include the following key practices.  A routine yearly physical is a good way to check with your health care provider about your health and preventive screening. It is a chance to share any concerns and updates on your health and to receive a thorough exam.  Visit your dentist for a routine exam and preventive care every 6 months. Brush your teeth twice a day and floss once a day. Good oral hygiene prevents tooth decay and gum disease.  The frequency of eye exams is based on your age, health, family medical history, use of contact lenses, and other factors. Follow your health care provider's recommendations for frequency of eye exams.  Eat a healthy diet. Foods like vegetables, fruits, whole grains, low-fat dairy products, and lean protein foods contain the nutrients you need without too many calories. Decrease your intake of foods high in solid fats, added sugars, and salt. Eat the right amount of calories for you.Get information about a proper diet from your health care provider, if necessary.  Regular physical exercise is one of the most important things you can do for your health. Most adults should get at least 150 minutes of moderate-intensity exercise (any activity that increases your heart rate and causes you to sweat) each week. In addition, most adults need muscle-strengthening exercises on 2 or more days a week.  Maintain a healthy weight. The body mass index (BMI) is a screening tool to identify possible weight problems. It provides an estimate of body fat based on height and weight. Your health care provider can find your BMI and can help you achieve or maintain a healthy weight.For adults 20 years and older:  A BMI below 18.5 is considered underweight.  A BMI of 18.5 to 24.9 is normal.  A BMI of 25 to 29.9 is considered overweight.  A  BMI of 30 and above is considered obese.  Maintain normal blood lipids and cholesterol levels by exercising and minimizing your intake of saturated fat. Eat a balanced diet with plenty of fruit and vegetables. Blood tests for lipids and cholesterol should begin at age 45 and be repeated every 5 years. If your lipid or cholesterol levels are high, you are over 50, or you are at high risk for heart disease, you may need your cholesterol levels checked more frequently.Ongoing high lipid and cholesterol levels should be treated with medicines if diet and exercise are not working.  If you smoke, find out from your health care provider how to quit. If you do not use tobacco, do not start.  Lung cancer screening is recommended for adults aged 45-80 years who are at high risk for developing lung cancer because of a history of smoking. A yearly low-dose CT scan of the lungs is recommended for people who have at least a 30-pack-year history of smoking and are a current smoker or have quit within the past 15 years. A pack year of smoking is smoking an average of 1 pack of cigarettes a day for 1 year (for example: 1 pack a day for 30 years or 2 packs a day for 15 years). Yearly screening should continue until the smoker has stopped smoking for at least 15 years. Yearly screening should be stopped for people who develop a health problem that would prevent them from having lung cancer treatment.  If you are pregnant, do not drink alcohol. If you are  breastfeeding, be very cautious about drinking alcohol. If you are not pregnant and choose to drink alcohol, do not have more than 1 drink per day. One drink is considered to be 12 ounces (355 mL) of beer, 5 ounces (148 mL) of wine, or 1.5 ounces (44 mL) of liquor.  Avoid use of street drugs. Do not share needles with anyone. Ask for help if you need support or instructions about stopping the use of drugs.  High blood pressure causes heart disease and increases the risk  of stroke. Your blood pressure should be checked at least every 1 to 2 years. Ongoing high blood pressure should be treated with medicines if weight loss and exercise do not work.  If you are 55-79 years old, ask your health care provider if you should take aspirin to prevent strokes.  Diabetes screening is done by taking a blood sample to check your blood glucose level after you have not eaten for a certain period of time (fasting). If you are not overweight and you do not have risk factors for diabetes, you should be screened once every 3 years starting at age 45. If you are overweight or obese and you are 40-70 years of age, you should be screened for diabetes every year as part of your cardiovascular risk assessment.  Breast cancer screening is essential preventive care for women. You should practice "breast self-awareness." This means understanding the normal appearance and feel of your breasts and may include breast self-examination. Any changes detected, no matter how small, should be reported to a health care provider. Women in their 20s and 30s should have a clinical breast exam (CBE) by a health care provider as part of a regular health exam every 1 to 3 years. After age 40, women should have a CBE every year. Starting at age 40, women should consider having a mammogram (breast X-ray test) every year. Women who have a family history of breast cancer should talk to their health care provider about genetic screening. Women at a high risk of breast cancer should talk to their health care providers about having an MRI and a mammogram every year.  Breast cancer gene (BRCA)-related cancer risk assessment is recommended for women who have family members with BRCA-related cancers. BRCA-related cancers include breast, ovarian, tubal, and peritoneal cancers. Having family members with these cancers may be associated with an increased risk for harmful changes (mutations) in the breast cancer genes BRCA1 and  BRCA2. Results of the assessment will determine the need for genetic counseling and BRCA1 and BRCA2 testing.  Your health care provider may recommend that you be screened regularly for cancer of the pelvic organs (ovaries, uterus, and vagina). This screening involves a pelvic examination, including checking for microscopic changes to the surface of your cervix (Pap test). You may be encouraged to have this screening done every 3 years, beginning at age 21.  For women ages 30-65, health care providers may recommend pelvic exams and Pap testing every 3 years, or they may recommend the Pap and pelvic exam, combined with testing for human papilloma virus (HPV), every 5 years. Some types of HPV increase your risk of cervical cancer. Testing for HPV may also be done on women of any age with unclear Pap test results.  Other health care providers may not recommend any screening for nonpregnant women who are considered low risk for pelvic cancer and who do not have symptoms. Ask your health care provider if a screening pelvic exam is right for   you.  If you have had past treatment for cervical cancer or a condition that could lead to cancer, you need Pap tests and screening for cancer for at least 20 years after your treatment. If Pap tests have been discontinued, your risk factors (such as having a new sexual partner) need to be reassessed to determine if screening should resume. Some women have medical problems that increase the chance of getting cervical cancer. In these cases, your health care provider may recommend more frequent screening and Pap tests.  Colorectal cancer can be detected and often prevented. Most routine colorectal cancer screening begins at the age of 50 years and continues through age 75 years. However, your health care provider may recommend screening at an earlier age if you have risk factors for colon cancer. On a yearly basis, your health care provider may provide home test kits to check  for hidden blood in the stool. Use of a small camera at the end of a tube, to directly examine the colon (sigmoidoscopy or colonoscopy), can detect the earliest forms of colorectal cancer. Talk to your health care provider about this at age 50, when routine screening begins. Direct exam of the colon should be repeated every 5-10 years through age 75 years, unless early forms of precancerous polyps or small growths are found.  People who are at an increased risk for hepatitis B should be screened for this virus. You are considered at high risk for hepatitis B if:  You were born in a country where hepatitis B occurs often. Talk with your health care provider about which countries are considered high risk.  Your parents were born in a high-risk country and you have not received a shot to protect against hepatitis B (hepatitis B vaccine).  You have HIV or AIDS.  You use needles to inject street drugs.  You live with, or have sex with, someone who has hepatitis B.  You get hemodialysis treatment.  You take certain medicines for conditions like cancer, organ transplantation, and autoimmune conditions.  Hepatitis C blood testing is recommended for all people born from 1945 through 1965 and any individual with known risks for hepatitis C.  Practice safe sex. Use condoms and avoid high-risk sexual practices to reduce the spread of sexually transmitted infections (STIs). STIs include gonorrhea, chlamydia, syphilis, trichomonas, herpes, HPV, and human immunodeficiency virus (HIV). Herpes, HIV, and HPV are viral illnesses that have no cure. They can result in disability, cancer, and death.  You should be screened for sexually transmitted illnesses (STIs) including gonorrhea and chlamydia if:  You are sexually active and are younger than 24 years.  You are older than 24 years and your health care provider tells you that you are at risk for this type of infection.  Your sexual activity has changed  since you were last screened and you are at an increased risk for chlamydia or gonorrhea. Ask your health care provider if you are at risk.  If you are at risk of being infected with HIV, it is recommended that you take a prescription medicine daily to prevent HIV infection. This is called preexposure prophylaxis (PrEP). You are considered at risk if:  You are sexually active and do not regularly use condoms or know the HIV status of your partner(s).  You take drugs by injection.  You are sexually active with a partner who has HIV.  Talk with your health care provider about whether you are at high risk of being infected with HIV. If   you choose to begin PrEP, you should first be tested for HIV. You should then be tested every 3 months for as long as you are taking PrEP.  Osteoporosis is a disease in which the bones lose minerals and strength with aging. This can result in serious bone fractures or breaks. The risk of osteoporosis can be identified using a bone density scan. Women ages 67 years and over and women at risk for fractures or osteoporosis should discuss screening with their health care providers. Ask your health care provider whether you should take a calcium supplement or vitamin D to reduce the rate of osteoporosis.  Menopause can be associated with physical symptoms and risks. Hormone replacement therapy is available to decrease symptoms and risks. You should talk to your health care provider about whether hormone replacement therapy is right for you.  Use sunscreen. Apply sunscreen liberally and repeatedly throughout the day. You should seek shade when your shadow is shorter than you. Protect yourself by wearing long sleeves, pants, a wide-brimmed hat, and sunglasses year round, whenever you are outdoors.  Once a month, do a whole body skin exam, using a mirror to look at the skin on your back. Tell your health care provider of new moles, moles that have irregular borders, moles that  are larger than a pencil eraser, or moles that have changed in shape or color.  Stay current with required vaccines (immunizations).  Influenza vaccine. All adults should be immunized every year.  Tetanus, diphtheria, and acellular pertussis (Td, Tdap) vaccine. Pregnant women should receive 1 dose of Tdap vaccine during each pregnancy. The dose should be obtained regardless of the length of time since the last dose. Immunization is preferred during the 27th-36th week of gestation. An adult who has not previously received Tdap or who does not know her vaccine status should receive 1 dose of Tdap. This initial dose should be followed by tetanus and diphtheria toxoids (Td) booster doses every 10 years. Adults with an unknown or incomplete history of completing a 3-dose immunization series with Td-containing vaccines should begin or complete a primary immunization series including a Tdap dose. Adults should receive a Td booster every 10 years.  Varicella vaccine. An adult without evidence of immunity to varicella should receive 2 doses or a second dose if she has previously received 1 dose. Pregnant females who do not have evidence of immunity should receive the first dose after pregnancy. This first dose should be obtained before leaving the health care facility. The second dose should be obtained 4-8 weeks after the first dose.  Human papillomavirus (HPV) vaccine. Females aged 13-26 years who have not received the vaccine previously should obtain the 3-dose series. The vaccine is not recommended for use in pregnant females. However, pregnancy testing is not needed before receiving a dose. If a female is found to be pregnant after receiving a dose, no treatment is needed. In that case, the remaining doses should be delayed until after the pregnancy. Immunization is recommended for any person with an immunocompromised condition through the age of 61 years if she did not get any or all doses earlier. During the  3-dose series, the second dose should be obtained 4-8 weeks after the first dose. The third dose should be obtained 24 weeks after the first dose and 16 weeks after the second dose.  Zoster vaccine. One dose is recommended for adults aged 30 years or older unless certain conditions are present.  Measles, mumps, and rubella (MMR) vaccine. Adults born  before 1957 generally are considered immune to measles and mumps. Adults born in 1957 or later should have 1 or more doses of MMR vaccine unless there is a contraindication to the vaccine or there is laboratory evidence of immunity to each of the three diseases. A routine second dose of MMR vaccine should be obtained at least 28 days after the first dose for students attending postsecondary schools, health care workers, or international travelers. People who received inactivated measles vaccine or an unknown type of measles vaccine during 1963-1967 should receive 2 doses of MMR vaccine. People who received inactivated mumps vaccine or an unknown type of mumps vaccine before 1979 and are at high risk for mumps infection should consider immunization with 2 doses of MMR vaccine. For females of childbearing age, rubella immunity should be determined. If there is no evidence of immunity, females who are not pregnant should be vaccinated. If there is no evidence of immunity, females who are pregnant should delay immunization until after pregnancy. Unvaccinated health care workers born before 1957 who lack laboratory evidence of measles, mumps, or rubella immunity or laboratory confirmation of disease should consider measles and mumps immunization with 2 doses of MMR vaccine or rubella immunization with 1 dose of MMR vaccine.  Pneumococcal 13-valent conjugate (PCV13) vaccine. When indicated, a person who is uncertain of his immunization history and has no record of immunization should receive the PCV13 vaccine. All adults 65 years of age and older should receive this  vaccine. An adult aged 19 years or older who has certain medical conditions and has not been previously immunized should receive 1 dose of PCV13 vaccine. This PCV13 should be followed with a dose of pneumococcal polysaccharide (PPSV23) vaccine. Adults who are at high risk for pneumococcal disease should obtain the PPSV23 vaccine at least 8 weeks after the dose of PCV13 vaccine. Adults older than 79 years of age who have normal immune system function should obtain the PPSV23 vaccine dose at least 1 year after the dose of PCV13 vaccine.  Pneumococcal polysaccharide (PPSV23) vaccine. When PCV13 is also indicated, PCV13 should be obtained first. All adults aged 65 years and older should be immunized. An adult younger than age 65 years who has certain medical conditions should be immunized. Any person who resides in a nursing home or long-term care facility should be immunized. An adult smoker should be immunized. People with an immunocompromised condition and certain other conditions should receive both PCV13 and PPSV23 vaccines. People with human immunodeficiency virus (HIV) infection should be immunized as soon as possible after diagnosis. Immunization during chemotherapy or radiation therapy should be avoided. Routine use of PPSV23 vaccine is not recommended for American Indians, [place] Natives, or people younger than 65 years unless there are medical conditions that require PPSV23 vaccine. When indicated, people who have unknown immunization and have no record of immunization should receive PPSV23 vaccine. One-time revaccination 5 years after the first dose of PPSV23 is recommended for people aged 19-64 years who have chronic kidney failure, nephrotic syndrome, asplenia, or immunocompromised conditions. People who received 1-2 doses of PPSV23 before age 65 years should receive another dose of PPSV23 vaccine at age 65 years or later if at least 5 years have passed since the previous dose. Doses of PPSV23 are not  needed for people immunized with PPSV23 at or after age 65 years.  Meningococcal vaccine. Adults with asplenia or persistent complement component deficiencies should receive 2 doses of quadrivalent meningococcal conjugate (MenACWY-D) vaccine. The doses should be obtained   at least 2 months apart. Microbiologists working with certain meningococcal bacteria, [place] recruits, people at risk during an outbreak, and people who travel to or live in countries with a high rate of meningitis should be immunized. A first-year college student up through age 34 years who is living in a residence hall should receive a dose if she did not receive a dose on or after her 16th birthday. Adults who have certain high-risk conditions should receive one or more doses of vaccine.  Hepatitis A vaccine. Adults who wish to be protected from this disease, have certain high-risk conditions, work with hepatitis A-infected animals, work in hepatitis A research labs, or travel to or work in countries with a high rate of hepatitis A should be immunized. Adults who were previously unvaccinated and who anticipate close contact with an international adoptee during the first 60 days after arrival in the [place] from a country with a high rate of hepatitis A should be immunized.  Hepatitis B vaccine. Adults who wish to be protected from this disease, have certain high-risk conditions, may be exposed to blood or other infectious body fluids, are household contacts or sex partners of hepatitis B positive people, are clients or workers in certain care facilities, or travel to or work in countries with a high rate of hepatitis B should be immunized.  Haemophilus influenzae type b (Hib) vaccine. A previously unvaccinated person with asplenia or sickle cell disease or having a scheduled splenectomy should receive 1 dose of Hib vaccine. Regardless of previous immunization, a recipient of a hematopoietic stem cell transplant should receive a  3-dose series 6-12 months after her successful transplant. Hib vaccine is not recommended for adults with HIV infection. Preventive Services / Frequency Ages 35 to 4 years  Blood pressure check.** / Every 3-5 years.  Lipid and cholesterol check.** / Every 5 years beginning at age 60.  Clinical breast exam.** / Every 3 years for women in their 71s and 10s.  BRCA-related cancer risk assessment.** / For women who have family members with a BRCA-related cancer (breast, ovarian, tubal, or peritoneal cancers).  Pap test.** / Every 2 years from ages 76 through 26. Every 3 years starting at age 61 through age 76 or 93 with a history of 3 consecutive normal Pap tests.  HPV screening.** / Every 3 years from ages 37 through ages 60 to 51 with a history of 3 consecutive normal Pap tests.  Hepatitis C blood test.** / For any individual with known risks for hepatitis C.  Skin self-exam. / Monthly.  Influenza vaccine. / Every year.  Tetanus, diphtheria, and acellular pertussis (Tdap, Td) vaccine.** / Consult your health care provider. Pregnant women should receive 1 dose of Tdap vaccine during each pregnancy. 1 dose of Td every 10 years.  Varicella vaccine.** / Consult your health care provider. Pregnant females who do not have evidence of immunity should receive the first dose after pregnancy.  HPV vaccine. / 3 doses over 6 months, if 93 and younger. The vaccine is not recommended for use in pregnant females. However, pregnancy testing is not needed before receiving a dose.  Measles, mumps, rubella (MMR) vaccine.** / You need at least 1 dose of MMR if you were born in 1957 or later. You may also need a 2nd dose. For females of childbearing age, rubella immunity should be determined. If there is no evidence of immunity, females who are not pregnant should be vaccinated. If there is no evidence of immunity, females who are  pregnant should delay immunization until after pregnancy.  Pneumococcal  13-valent conjugate (PCV13) vaccine.** / Consult your health care provider.  Pneumococcal polysaccharide (PPSV23) vaccine.** / 1 to 2 doses if you smoke cigarettes or if you have certain conditions.  Meningococcal vaccine.** / 1 dose if you are age 68 to 8 years and a Market researcher living in a residence hall, or have one of several medical conditions, you need to get vaccinated against meningococcal disease. You may also need additional booster doses.  Hepatitis A vaccine.** / Consult your health care provider.  Hepatitis B vaccine.** / Consult your health care provider.  Haemophilus influenzae type b (Hib) vaccine.** / Consult your health care provider. Ages 7 to 53 years  Blood pressure check.** / Every year.  Lipid and cholesterol check.** / Every 5 years beginning at age 25 years.  Lung cancer screening. / Every year if you are aged 11-80 years and have a 30-pack-year history of smoking and currently smoke or have quit within the past 15 years. Yearly screening is stopped once you have quit smoking for at least 15 years or develop a health problem that would prevent you from having lung cancer treatment.  Clinical breast exam.** / Every year after age 48 years.  BRCA-related cancer risk assessment.** / For women who have family members with a BRCA-related cancer (breast, ovarian, tubal, or peritoneal cancers).  Mammogram.** / Every year beginning at age 41 years and continuing for as long as you are in good health. Consult with your health care provider.  Pap test.** / Every 3 years starting at age 65 years through age 37 or 70 years with a history of 3 consecutive normal Pap tests.  HPV screening.** / Every 3 years from ages 72 years through ages 60 to 40 years with a history of 3 consecutive normal Pap tests.  Fecal occult blood test (FOBT) of stool. / Every year beginning at age 21 years and continuing until age 5 years. You may not need to do this test if you get  a colonoscopy every 10 years.  Flexible sigmoidoscopy or colonoscopy.** / Every 5 years for a flexible sigmoidoscopy or every 10 years for a colonoscopy beginning at age 35 years and continuing until age 48 years.  Hepatitis C blood test.** / For all people born from [birth year] through 1965 and any individual with known risks for hepatitis C.  Skin self-exam. / Monthly.  Influenza vaccine. / Every year.  Tetanus, diphtheria, and acellular pertussis (Tdap/Td) vaccine.** / Consult your health care provider. Pregnant women should receive 1 dose of Tdap vaccine during each pregnancy. 1 dose of Td every 10 years.  Varicella vaccine.** / Consult your health care provider. Pregnant females who do not have evidence of immunity should receive the first dose after pregnancy.  Zoster vaccine.** / 1 dose for adults aged 30 years or older.  Measles, mumps, rubella (MMR) vaccine.** / You need at least 1 dose of MMR if you were born in 1957 or later. You may also need a second dose. For females of childbearing age, rubella immunity should be determined. If there is no evidence of immunity, females who are not pregnant should be vaccinated. If there is no evidence of immunity, females who are pregnant should delay immunization until after pregnancy.  Pneumococcal 13-valent conjugate (PCV13) vaccine.** / Consult your health care provider.  Pneumococcal polysaccharide (PPSV23) vaccine.** / 1 to 2 doses if you smoke cigarettes or if you have certain conditions.  Meningococcal vaccine.** /  Consult your health care provider.  Hepatitis A vaccine.** / Consult your health care provider.  Hepatitis B vaccine.** / Consult your health care provider.  Haemophilus influenzae type b (Hib) vaccine.** / Consult your health care provider. Ages 64 years and over  Blood pressure check.** / Every year.  Lipid and cholesterol check.** / Every 5 years beginning at age 23 years.  Lung cancer screening. / Every year if you  are aged 16-80 years and have a 30-pack-year history of smoking and currently smoke or have quit within the past 15 years. Yearly screening is stopped once you have quit smoking for at least 15 years or develop a health problem that would prevent you from having lung cancer treatment.  Clinical breast exam.** / Every year after age 74 years.  BRCA-related cancer risk assessment.** / For women who have family members with a BRCA-related cancer (breast, ovarian, tubal, or peritoneal cancers).  Mammogram.** / Every year beginning at age 44 years and continuing for as long as you are in good health. Consult with your health care provider.  Pap test.** / Every 3 years starting at age 58 years through age 22 or 39 years with 3 consecutive normal Pap tests. Testing can be stopped between 65 and 70 years with 3 consecutive normal Pap tests and no abnormal Pap or HPV tests in the past 10 years.  HPV screening.** / Every 3 years from ages 64 years through ages 70 or 61 years with a history of 3 consecutive normal Pap tests. Testing can be stopped between 65 and 70 years with 3 consecutive normal Pap tests and no abnormal Pap or HPV tests in the past 10 years.  Fecal occult blood test (FOBT) of stool. / Every year beginning at age 40 years and continuing until age 27 years. You may not need to do this test if you get a colonoscopy every 10 years.  Flexible sigmoidoscopy or colonoscopy.** / Every 5 years for a flexible sigmoidoscopy or every 10 years for a colonoscopy beginning at age 7 years and continuing until age 32 years.  Hepatitis C blood test.** / For all people born from [birth year] through 1963 and any individual with known risks for hepatitis C.  Osteoporosis screening.** / A one-time screening for women ages 30 years and over and women at risk for fractures or osteoporosis.  Skin self-exam. / Monthly.  Influenza vaccine. / Every year.  Tetanus, diphtheria, and acellular pertussis (Tdap/Td)  vaccine.** / 1 dose of Td every 10 years.  Varicella vaccine.** / Consult your health care provider.  Zoster vaccine.** / 1 dose for adults aged 35 years or older.  Pneumococcal 13-valent conjugate (PCV13) vaccine.** / Consult your health care provider.  Pneumococcal polysaccharide (PPSV23) vaccine.** / 1 dose for all adults aged 46 years and older.  Meningococcal vaccine.** / Consult your health care provider.  Hepatitis A vaccine.** / Consult your health care provider.  Hepatitis B vaccine.** / Consult your health care provider.  Haemophilus influenzae type b (Hib) vaccine.** / Consult your health care provider. ** Family history and personal history of risk and conditions may change your health care provider's recommendations.   This information is not intended to replace advice given to you by your health care provider. Make sure you discuss any questions you have with your health care provider.   Document Released: 04/16/2001 Document Revised: 03/11/2014 Document Reviewed: 07/16/2010 Elsevier Interactive Patient Education [employer].

## 2015-05-18 NOTE — Progress Notes (Signed)
Subjective:   Marissa Cruz is a 81 y.o. female who presents for Medicare Annual (Subsequent) preventive examination.  Review of Systems:   Review of Systems  Constitutional: Negative for activity change, appetite change and fatigue.  HENT: Negative for hearing loss, congestion, tinnitus and ear discharge.   Eyes: Negative for visual disturbance (see optho q1y -- vision corrected to 20/20 with glasses).  Respiratory: Negative for cough, chest tightness and shortness of breath.   Cardiovascular: Negative for chest pain, palpitations and leg swelling.  Gastrointestinal: Negative for abdominal pain, diarrhea, constipation and abdominal distention.  Genitourinary: Negative for urgency, frequency, decreased urine volume and difficulty urinating.  Musculoskeletal: Negative for back pain, arthralgias and gait problem.  Skin: Negative for color change, pallor and rash.  Neurological: Negative for dizziness, light-headedness, numbness and headaches.  Hematological: Negative for adenopathy. Does not bruise/bleed easily.  Psychiatric/Behavioral: Negative for suicidal ideas, confusion, sleep disturbance, self-injury, dysphoric mood, decreased concentration and agitation.  Pt is able to read and write and can do all ADLs No risk for falling No abuse/ violence in home           Objective:     Vitals: BP 134/76 mmHg  Pulse 98  Temp(Src) 97.9 F (36.6 C) (Oral)  Ht 5' 7" (1.702 m)  Wt 116 lb 12.8 oz (52.98 kg)  BMI 18.29 kg/m2  SpO2 94% BP 134/76 mmHg  Pulse 98  Temp(Src) 97.9 F (36.6 C) (Oral)  Ht 5' 7" (1.702 m)  Wt 116 lb 12.8 oz (52.98 kg)  BMI 18.29 kg/m2  SpO2 94% General appearance: alert, cooperative, appears stated age and no distress Head: Normocephalic, without obvious abnormality, atraumatic Eyes: negative findings: lids and lashes normal and pupils equal, round, reactive to light and accomodation Ears: normal TM's and external ear canals both ears Nose: Nares  normal. Septum midline. Mucosa normal. No drainage or sinus tenderness. Throat: lips, mucosa, and tongue normal; teeth and gums normal Neck: no adenopathy, supple, symmetrical, trachea midline and thyroid not enlarged, symmetric, no tenderness/mass/nodules Back: symmetric, no curvature. ROM normal. No CVA tenderness. Lungs: clear to auscultation bilaterally Breasts: normal appearance, no masses or tenderness Heart: regular rate and rhythm, S1, S2 normal, no murmur, click, rub or gallop Abdomen: soft, non-tender; bowel sounds normal; no masses,  no organomegaly Extremities: extremities normal, atraumatic, no cyanosis or edema Pulses: 2+ and symmetric Skin: Skin color, texture, turgor normal. No rashes or lesions Lymph nodes: Cervical, supraclavicular, and axillary nodes normal. Neurologic: Alert and oriented X 3, normal strength and tone. Normal symmetric reflexes. Normal coordination and gait Tobacco History  Smoking status  . Former Smoker -- 0.50 packs/day for 60 years  . Types: Cigarettes  . Quit date: 08/19/2014  Smokeless tobacco  . Never Used     Counseling given: Not Answered   Past Medical History  Diagnosis Date  . Chicken pox   . Depression   . Diverticulitis   . GERD (gastroesophageal reflux disease)   . Hypertension   . Kidney disease   . Colon polyp   . Carotid artery disease ([place])   . CAP (community acquired pneumonia) 08/19/2014  . Vocal cord cancer ([place]) ~ 2009    S/P radiation  . 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 Anemia   . History of blood transfusion 1976; 2013    "w/hysterectomy; hip OR"  . TIA (transient ischemic attack) ~ 2009 X 2  . Arthritis     "thumbs,  hands" (08/18/2014)  . Chronic lower back pain   . History of gout   . Anxiety   . Age-related macular degeneration, wet, left eye ([place])   . Age-related macular degeneration, dry, right eye    Past Surgical History  Procedure Laterality Date  . Abdominal  hysterectomy  1976  . Total hip arthroplasty Left 2013  . Carotid endarterectomy Left ~ 2014  . Colonoscopy N/A 01/31/2014    Procedure: COLONOSCOPY;  Surgeon: Ladene Artist, MD;  Location: [facility];  Service: Endoscopy;  Laterality: N/A;  . Mouth floor mass excision  03/2014    "removed cancer underneath my tongue"  . Tonsillectomy  1941  . Appendectomy  1954  . Joint replacement    . Dilation and curettage of uterus    . Cataract extraction w/ intraocular lens  implant, bilateral Bilateral ~ 2009   Family History  Problem Relation Age of Onset  . Hypertension Father    History  Sexual Activity  . Sexual Activity: Not Currently    Outpatient Encounter Prescriptions as of 05/18/2015  Medication Sig  . acetaminophen (TYLENOL) 650 MG CR tablet Take 1,300 mg by mouth at bedtime.  Marland Kitchen albuterol (PROVENTIL HFA;VENTOLIN HFA) 108 (90 BASE) MCG/ACT inhaler Inhale 2 puffs into the lungs every 6 (six) hours as needed for wheezing or shortness of breath.  . allopurinol (ZYLOPRIM) 100 MG tablet TAKE 1 TABLET BY MOUTH AT BEDTIME  . ALPRAZolam (XANAX) 0.25 MG tablet Take 0.25 mg by mouth as needed for anxiety.  Marland Kitchen amLODipine (NORVASC) 5 MG tablet TAKE 1 TABLET BY MOUTH DAILY FOR HYPERTENSION  . aspirin 81 MG tablet Take 81 mg by mouth daily.  . beta carotene w/minerals (OCUVITE) tablet Take 1 tablet by mouth every morning.  . Cholecalciferol 4000 UNITS CAPS Take 1 capsule by mouth every morning. Vitamin D 3  . diphenhydrAMINE (SIMPLY SLEEP) 25 MG tablet Take 25 mg by mouth at bedtime as needed for sleep.  Marland Kitchen docusate sodium (COLACE) 100 MG capsule Take 100 mg by mouth every morning.  . ferrous sulfate 325 (65 FE) MG tablet Take 325 mg by mouth 2 (two) times daily with a meal.   . LORazepam (ATIVAN) 0.5 MG tablet Take 0.5 mg by mouth as needed for anxiety. Reported on 05/17/2015  . metoprolol succinate (TOPROL-XL) 25 MG 24 hr tablet Take 1 tablet (25 mg total) by mouth every morning.  .  mirtazapine (REMERON SOL-TAB) 15 MG disintegrating tablet DISSOLVE 1 TABLET ON THE TONGUE AT BEDTIME  . Multiple Vitamins-Minerals (HM MULTIVITAMIN ADULT GUMMY) CHEW Chew 2 tablets by mouth every morning.  . ondansetron (ZOFRAN-ODT) 8 MG disintegrating tablet Take 8 mg by mouth as needed for nausea or vomiting. Reported on 05/17/2015  . PARoxetine (PAXIL) 10 MG tablet TAKE 1 TABLET BY MOUTH DAILY FOR DEPRESSION  . [name] 0.625 MG tablet TAKE 1 TABLET BY MOUTH DAILY FOR 21 DAYS, THEN DO NOT TAKE FOR 7 DAYS  . traMADol (ULTRAM) 50 MG tablet Take 1 tablet (50 mg total) by mouth every 12 (twelve) hours as needed.  . traZODone (DESYREL) 50 MG tablet TAKE 1 TABLET(50 MG) BY MOUTH AT BEDTIME  . [DISCONTINUED] alendronate (FOSAMAX) 70 MG tablet Take 1 tablet (70 mg total) by mouth once a week. Take with a full glass of water on an empty stomach. (Patient not taking: Reported on 05/17/2015)   No facility-administered encounter medications on file as of 05/18/2015.    Activities of Daily Living In your present state  of health, do you have any difficulty performing the following activities: 05/18/2015 08/19/2014  Hearing? N N  Vision? Y Y  Difficulty concentrating or making decisions? N N  Walking or climbing stairs? N Y  Dressing or bathing? N Y  Doing errands, shopping? [place]    Patient Care Team: Rosalita Chessman, DO as PCP - General (Family Medicine) Ladene Artist, MD as Consulting Physician (Gastroenterology) Michaela Corner, DDS as Consulting Physician (Oral Surgery) Devra Dopp, MD as Referring Physician (Dermatology) Milus Height, MD as Referring Physician (Ophthalmology)    Assessment:    Psych- no depression, no anxiety Exercise Activities and Dietary recommendations Current Exercise Habits: The patient does not participate in regular exercise at present, Exercise limited by: orthopedic condition(s)  Goals    None     Fall Risk Fall Risk  05/18/2015 11/17/2014 10/21/2013   Falls in the past year? No No No   Depression Screen PHQ 2/9 Scores 05/18/2015 11/17/2014 10/21/2013  PHQ - 2 Score 0 0 0     Cognitive Testing mmse 30/30  Immunization History  Administered Date(s) Administered  . Influenza, High Dose Seasonal PF 11/17/2014  . Influenza-Unspecified 12/02/2013  . Pneumococcal Conjugate-13 11/17/2014  . Pneumococcal Polysaccharide-23 10/21/2013  . Tdap 05/02/2013  . Zoster 11/02/2013   Screening Tests Health Maintenance  Topic Date Due  . INFLUENZA VACCINE  10/03/2015  . TETANUS/TDAP  05/03/2023  . DEXA SCAN  Completed  . ZOSTAVAX  Completed  . PNA vac Low Risk Adult  Completed      Plan:    see AVS During the course of the visit the patient was educated and counseled about the following appropriate screening and preventive services:   Vaccines to include Pneumoccal, Influenza, Hepatitis B, Td, Zostavax, HCV  Electrocardiogram  Cardiovascular Disease  Colorectal cancer screening  Bone density screening  Diabetes screening  Glaucoma screening  Mammography/PAP  Nutrition counseling   Patient Instructions (the written plan) was given to the patient.  1. Essential hypertension con't norvasc, metoprolol - Comp Met (CMET) - CBC with Differential/Platelet - Lipid panel - POCT urinalysis dipstick  2. Medicare annual wellness visit, subsequent See AVS ghm utd Check labs  3. Routine history and physical examination of adult    4. Other abnormal findings in urine   - Urine culture  Garnet Koyanagi, DO  05/18/2015

## 2015-05-18 NOTE — Progress Notes (Signed)
Pre visit review using our clinic review tool, if applicable. No additional management support is needed unless otherwise documented below in the visit note. 

## 2015-05-20 LAB — URINE CULTURE

## 2015-05-23 ENCOUNTER — Encounter: Payer: Self-pay | Admitting: Family Medicine

## 2015-05-23 NOTE — Telephone Encounter (Signed)
Dr.Lowne Bactrim was not prescribed while in office. Please advise    KP

## 2015-05-24 ENCOUNTER — Other Ambulatory Visit: Payer: Self-pay | Admitting: Family Medicine

## 2015-05-24 ENCOUNTER — Telehealth: Payer: Self-pay | Admitting: *Deleted

## 2015-05-24 ENCOUNTER — Encounter: Payer: Self-pay | Admitting: Family Medicine

## 2015-05-24 DIAGNOSIS — N390 Urinary tract infection, site not specified: Secondary | ICD-10-CM

## 2015-05-24 MED ORDER — SULFAMETHOXAZOLE-TRIMETHOPRIM 800-160 MG PO TABS: 1.0000 | ORAL_TABLET | Freq: Two times a day (BID) | ORAL | Status: DC

## 2015-05-24 NOTE — Telephone Encounter (Signed)
Received Home Health Certification and Plan of Care; forwarded to provider/SLS 03/22

## 2015-05-24 NOTE — Telephone Encounter (Signed)
Dr.Lowne No medication was sent, please advise     KP

## 2015-05-30 DIAGNOSIS — H353114 Nonexudative age-related macular degeneration, right eye, advanced atrophic with subfoveal involvement: Secondary | ICD-10-CM | POA: Diagnosis not present

## 2015-05-30 DIAGNOSIS — H353223 Exudative age-related macular degeneration, left eye, with inactive scar: Secondary | ICD-10-CM | POA: Diagnosis not present

## 2015-06-12 DIAGNOSIS — Z483 Aftercare following surgery for neoplasm: Secondary | ICD-10-CM | POA: Diagnosis not present

## 2015-06-12 DIAGNOSIS — D0006 Carcinoma in situ of floor of mouth: Secondary | ICD-10-CM | POA: Diagnosis not present

## 2015-06-12 DIAGNOSIS — C049 Malignant neoplasm of floor of mouth, unspecified: Secondary | ICD-10-CM | POA: Diagnosis not present

## 2015-06-12 DIAGNOSIS — Z87891 Personal history of nicotine dependence: Secondary | ICD-10-CM | POA: Diagnosis not present

## 2015-06-16 ENCOUNTER — Other Ambulatory Visit: Payer: Self-pay | Admitting: Family Medicine

## 2015-06-21 ENCOUNTER — Other Ambulatory Visit: Payer: Self-pay | Admitting: Family Medicine

## 2015-06-21 NOTE — Telephone Encounter (Signed)
Last seen 12/28/14 and filled 05/18/15   Please advise     KP

## 2015-06-30 ENCOUNTER — Other Ambulatory Visit: Payer: Self-pay | Admitting: Family Medicine

## 2015-06-30 NOTE — Telephone Encounter (Signed)
Trazodone and [name] both filled 10/2014. Pt has f/u on 11/2015.  Please advise refills?

## 2015-07-25 ENCOUNTER — Other Ambulatory Visit: Payer: Self-pay | Admitting: Family Medicine

## 2015-08-24 ENCOUNTER — Other Ambulatory Visit: Payer: Self-pay | Admitting: Family Medicine

## 2015-08-31 DIAGNOSIS — L821 Other seborrheic keratosis: Secondary | ICD-10-CM | POA: Diagnosis not present

## 2015-08-31 DIAGNOSIS — L814 Other melanin hyperpigmentation: Secondary | ICD-10-CM | POA: Diagnosis not present

## 2015-08-31 DIAGNOSIS — L820 Inflamed seborrheic keratosis: Secondary | ICD-10-CM | POA: Diagnosis not present

## 2015-08-31 DIAGNOSIS — D1801 Hemangioma of skin and subcutaneous tissue: Secondary | ICD-10-CM | POA: Diagnosis not present

## 2015-09-16 ENCOUNTER — Other Ambulatory Visit: Payer: Self-pay | Admitting: Family Medicine

## 2015-09-18 NOTE — Telephone Encounter (Signed)
Requesting Remeron Sol-Tab '15mg'$ -Dissolve 1 tablet on the tongue at bedtime. Last refill:06/22/15;90,0 Last OV:05/18/15 Please advise.//AB/CMA

## 2015-09-20 ENCOUNTER — Other Ambulatory Visit: Payer: Self-pay | Admitting: Family Medicine

## 2015-09-20 NOTE — Telephone Encounter (Signed)
Rx sent to the pharmacy by e-script.//AB/CMA 

## 2015-09-24 ENCOUNTER — Other Ambulatory Visit: Payer: Self-pay | Admitting: Family Medicine

## 2015-09-25 NOTE — Telephone Encounter (Signed)
Last seen 05/18/15 and filled 07/02/15 #90  Please advise   KP

## 2015-10-09 ENCOUNTER — Ambulatory Visit (INDEPENDENT_AMBULATORY_CARE_PROVIDER_SITE_OTHER): Payer: Medicare Other | Admitting: Family

## 2015-10-09 VITALS — BP 140/80 | HR 97 | Temp 98.1°F | Resp 18 | Wt 104.0 lb

## 2015-10-09 DIAGNOSIS — R102 Pelvic and perineal pain: Secondary | ICD-10-CM

## 2015-10-09 LAB — POCT URINALYSIS DIPSTICK
BILIRUBIN UA: NEGATIVE
Blood, UA: NEGATIVE
GLUCOSE UA: NEGATIVE
Ketones, UA: NEGATIVE
LEUKOCYTES UA: NEGATIVE
NITRITE UA: NEGATIVE
Protein, UA: 0.15
Spec Grav, UA: 1.02
Urobilinogen, UA: 2
pH, UA: 5.5

## 2015-10-09 MED ORDER — CEPHALEXIN 500 MG PO CAPS: 500.0000 mg | ORAL_CAPSULE | Freq: Two times a day (BID) | ORAL | 0 refills | Status: DC

## 2015-10-09 NOTE — Progress Notes (Signed)
Subjective:    Patient ID: Marissa Cruz, female    DOB: [birth year], 81 y.o.   MRN: [id number]  HPI  Marissa Cruz is a 81 yr old female who presents today with two concerns:  1) Reports some groin discomfort. Denies dysuria.  Denies vulvar pain.  Denies unusual low back pain. Denies fever.  Marissa Cruz denies vaginal discharge.  Reports that this symptom is typical of her her UTI's in the past.   2) Otalgia- right ear x 2 weeks. Pain is located right pinna. No drainage.    Review of Systems See HPI  Past Medical History:  Diagnosis Date  . Age-related macular degeneration, dry, right eye   . Age-related macular degeneration, wet, left eye ([place])   . Anemia   . Anxiety   . Arthritis    "thumbs, hands" (08/18/2014)  . CAP (community acquired pneumonia) 08/19/2014  . Carotid artery disease ([place])   . Chicken pox   . Chronic lower back pain   . Colon polyp   . Depression   . Diverticulitis   . GERD (gastroesophageal reflux disease)   . History of blood transfusion 1976; 2013   "w/hysterectomy; hip OR"  . History of gout   . Hypertension   . Kidney disease   . 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 TIA (transient ischemic attack) ~ 2009 X 2  . Vocal cord cancer ([place]) ~ 2009   S/P radiation     Social History   Social History  . Marital status: Widowed    Spouse name: N/A  . Number of children: 1  . Years of education: N/A   Occupational History  . Retired    Social History Main Topics  . Smoking status: Former Smoker    Packs/day: 0.50    Years: 60.00    Types: Cigarettes    Quit date: 08/19/2014  . Smokeless tobacco: Never Used  . Alcohol use 8.4 oz/week    14 Shots of liquor per week     Comment: 08/19/2014 "2 shots brandy q night"  . Drug use: No  . Sexual activity: Not Currently   Other Topics Concern  . Not on file   Social History Narrative  . No narrative on file    Past Surgical History:  Procedure Laterality Date  .  ABDOMINAL HYSTERECTOMY  1976  . APPENDECTOMY  1954  . CAROTID ENDARTERECTOMY Left ~ 2014  . CATARACT EXTRACTION W/ INTRAOCULAR LENS  IMPLANT, BILATERAL Bilateral ~ 2009  . COLONOSCOPY N/A 01/31/2014   Procedure: COLONOSCOPY;  Surgeon: Ladene Artist, MD;  Location: [facility];  Service: Endoscopy;  Laterality: N/A;  . DILATION AND CURETTAGE OF UTERUS    . JOINT REPLACEMENT    . MOUTH FLOOR MASS EXCISION  03/2014   "removed cancer underneath my tongue"  . TONSILLECTOMY  1941  . TOTAL HIP ARTHROPLASTY Left 2013    Family History  Problem Relation Age of Onset  . Hypertension Father     Allergies  Allergen Reactions  . Morphine And Related Other (See Comments)    Hallucinations and combative  . Codeine Nausea Only    Current Outpatient Prescriptions on File Prior to Visit  Medication Sig Dispense Refill  . acetaminophen (TYLENOL) 650 MG CR tablet Take 1,300 mg by mouth at bedtime.    Marland Kitchen albuterol (PROVENTIL HFA;VENTOLIN HFA) 108 (90 BASE) MCG/ACT inhaler Inhale 2 puffs into the lungs every 6 (six) hours as needed for wheezing  or shortness of breath. 1 Inhaler 2  . allopurinol (ZYLOPRIM) 100 MG tablet TAKE 1 TABLET BY MOUTH AT BEDTIME 90 tablet 1  . ALPRAZolam (XANAX) 0.25 MG tablet Take 0.25 mg by mouth as needed for anxiety.    Marland Kitchen amLODipine (NORVASC) 5 MG tablet TAKE 1 TABLET BY MOUTH DAILY FOR HYPERTENSION 90 tablet 1  . aspirin 81 MG tablet Take 81 mg by mouth daily.    . beta carotene w/minerals (OCUVITE) tablet Take 1 tablet by mouth every morning.    . Cholecalciferol 4000 UNITS CAPS Take 1 capsule by mouth every morning. Vitamin D 3    . diphenhydrAMINE (SIMPLY SLEEP) 25 MG tablet Take 25 mg by mouth at bedtime as needed for sleep.    Marland Kitchen docusate sodium (COLACE) 100 MG capsule Take 100 mg by mouth every morning.    . ferrous sulfate 325 (65 FE) MG tablet Take 325 mg by mouth 2 (two) times daily with a meal.     . LORazepam (ATIVAN) 0.5 MG tablet Take 0.5 mg by mouth as  needed for anxiety. Reported on 05/17/2015    . metoprolol succinate (TOPROL-XL) 25 MG 24 hr tablet TAKE 1 TABLET(25 MG) BY MOUTH EVERY MORNING 90 tablet 1  . mirtazapine (REMERON SOL-TAB) 15 MG disintegrating tablet DISSOLVE 1 TABLET ON THE TONGUE AT BEDTIME 90 tablet 0  . Multiple Vitamins-Minerals (HM MULTIVITAMIN ADULT GUMMY) CHEW Chew 2 tablets by mouth every morning.    Marland Kitchen omeprazole (PRILOSEC) 20 MG capsule TAKE ONE CAPSULE BY MOUTH TWICE DAILY BEFORE A MEAL 60 capsule 11  . ondansetron (ZOFRAN-ODT) 8 MG disintegrating tablet Take 8 mg by mouth as needed for nausea or vomiting. Reported on 05/17/2015    . PARoxetine (PAXIL) 10 MG tablet TAKE 1 TABLET BY MOUTH DAILY FOR DEPRESSION 90 tablet 1  . [name] 0.625 MG tablet TAKE 1 TABLET BY MOUTH EVERY DAY FOR 21 DAYS, THEN DO NOT TAKE FOR 7 DAYS 90 tablet 0  . sulfamethoxazole-trimethoprim (BACTRIM DS,SEPTRA DS) 800-160 MG tablet Take 1 tablet by mouth 2 (two) times daily. 14 tablet 0  . traMADol (ULTRAM) 50 MG tablet Take 1 tablet (50 mg total) by mouth every 12 (twelve) hours as needed. 8 tablet 0  . traZODone (DESYREL) 50 MG tablet TAKE 1 TABLET(50 MG) BY MOUTH AT BEDTIME 90 tablet 0   No current facility-administered medications on file prior to visit.     BP 140/80   Pulse 97   Temp 98.1 F (36.7 C)   Resp 18   Wt 104 lb (47.2 kg)   SpO2 94%   BMI 16.29 kg/m       Objective:   Physical Exam  Constitutional: Marissa Cruz appears well-developed and well-nourished.  HENT:  Cyst with mild erythema inside fold of right upper pinna (cura of antihelix)  Cardiovascular: Normal rate, regular rhythm and normal heart sounds.   No murmur heard. Pulmonary/Chest: Effort normal and breath sounds normal. No respiratory distress. Marissa Cruz has no wheezes.  Abdominal: Soft. Marissa Cruz exhibits no distension. There is no tenderness.  Genitourinary:  Genitourinary Comments: Neg CVAT  Psychiatric: Marissa Cruz has a normal mood and affect. Her behavior is normal. Judgment  and thought content normal.          Assessment & Plan:  Cyst right ear- mild.  Will rx with keflex.    Pelvic pain- UA remarkable only for protein. Will send urine for culture.  I did recommend a pelvic exam.  However, pt declined.  Keflex  should empirically cover UTI.  Marissa Cruz is advised to call if symptoms worsen or do not improve.

## 2015-10-09 NOTE — Progress Notes (Signed)
Pre visit review using our clinic review tool, if applicable. No additional management support is needed unless otherwise documented below in the visit note. 

## 2015-10-09 NOTE — Patient Instructions (Signed)
Please begin keflex for the cyst on your ear.   We will contact you with the results of your urine culture. Please call if new/worsening symptoms or if symptoms are not resolved by the time you complete the keflex.

## 2015-10-11 LAB — URINE CULTURE

## 2015-11-21 ENCOUNTER — Ambulatory Visit (INDEPENDENT_AMBULATORY_CARE_PROVIDER_SITE_OTHER): Payer: Medicare Other | Admitting: Family Medicine

## 2015-11-21 ENCOUNTER — Encounter: Payer: Self-pay | Admitting: Family Medicine

## 2015-11-21 VITALS — BP 160/80 | HR 96 | Temp 98.7°F | Resp 17 | Ht 67.0 in | Wt 100.6 lb

## 2015-11-21 DIAGNOSIS — I10 Essential (primary) hypertension: Secondary | ICD-10-CM | POA: Diagnosis not present

## 2015-11-21 DIAGNOSIS — Q181 Preauricular sinus and cyst: Secondary | ICD-10-CM

## 2015-11-21 DIAGNOSIS — F411 Generalized anxiety disorder: Secondary | ICD-10-CM

## 2015-11-21 DIAGNOSIS — L729 Follicular cyst of the skin and subcutaneous tissue, unspecified: Secondary | ICD-10-CM

## 2015-11-21 DIAGNOSIS — M159 Polyosteoarthritis, unspecified: Secondary | ICD-10-CM

## 2015-11-21 DIAGNOSIS — E46 Unspecified protein-calorie malnutrition: Secondary | ICD-10-CM

## 2015-11-21 DIAGNOSIS — Z23 Encounter for immunization: Secondary | ICD-10-CM | POA: Diagnosis not present

## 2015-11-21 DIAGNOSIS — M150 Primary generalized (osteo)arthritis: Secondary | ICD-10-CM | POA: Diagnosis not present

## 2015-11-21 DIAGNOSIS — N184 Chronic kidney disease, stage 4 (severe): Secondary | ICD-10-CM

## 2015-11-21 LAB — CBC WITH DIFFERENTIAL/PLATELET
Basophils Absolute: 0.1 10*3/uL (ref 0.0–0.1)
Basophils Relative: 0.5 % (ref 0.0–3.0)
EOS PCT: 0.6 % (ref 0.0–5.0)
Eosinophils Absolute: 0.1 10*3/uL (ref 0.0–0.7)
HEMATOCRIT: 34.5 % — AB (ref 36.0–46.0)
HEMOGLOBIN: 11.1 g/dL — AB (ref 12.0–15.0)
LYMPHS PCT: 11.2 % — AB (ref 12.0–46.0)
Lymphs Abs: 1.3 10*3/uL (ref 0.7–4.0)
MCHC: 32.2 g/dL (ref 30.0–36.0)
MCV: 95.6 fl (ref 78.0–100.0)
MONOS PCT: 7.4 % (ref 3.0–12.0)
Monocytes Absolute: 0.9 10*3/uL (ref 0.1–1.0)
Neutro Abs: 9.5 10*3/uL — ABNORMAL HIGH (ref 1.4–7.7)
Neutrophils Relative %: 80.3 % — ABNORMAL HIGH (ref 43.0–77.0)
Platelets: 488 10*3/uL — ABNORMAL HIGH (ref 150.0–400.0)
RBC: 3.6 Mil/uL — AB (ref 3.87–5.11)
RDW: 14.2 % (ref 11.5–15.5)
WBC: 11.9 10*3/uL — AB (ref 4.0–10.5)

## 2015-11-21 LAB — POCT URINALYSIS DIPSTICK
BILIRUBIN UA: NEGATIVE
Blood, UA: NEGATIVE
Glucose, UA: NEGATIVE
KETONES UA: NEGATIVE
LEUKOCYTES UA: NEGATIVE
NITRITE UA: NEGATIVE
PH UA: 6
PROTEIN UA: 30
Spec Grav, UA: 1.02
Urobilinogen, UA: 0.2

## 2015-11-21 LAB — COMPREHENSIVE METABOLIC PANEL
ALK PHOS: 82 U/L (ref 39–117)
ALT: 10 U/L (ref 0–35)
AST: 19 U/L (ref 0–37)
Albumin: 3.6 g/dL (ref 3.5–5.2)
BILIRUBIN TOTAL: 0.2 mg/dL (ref 0.2–1.2)
BUN: 17 mg/dL (ref 6–23)
CO2: 33 mEq/L — ABNORMAL HIGH (ref 19–32)
CREATININE: 1.28 mg/dL — AB (ref 0.40–1.20)
Calcium: 9.5 mg/dL (ref 8.4–10.5)
Chloride: 97 mEq/L (ref 96–112)
GFR: 42.75 mL/min — AB (ref >60.00)
GLUCOSE: 96 mg/dL (ref 70–99)
Potassium: 5.1 mEq/L (ref 3.5–5.1)
Sodium: 137 mEq/L (ref 135–145)
TOTAL PROTEIN: 7 g/dL (ref 6.0–8.3)

## 2015-11-21 LAB — LIPID PANEL
Cholesterol: 183 mg/dL (ref 0–200)
HDL: 94.3 mg/dL (ref >39.00)
LDL Cholesterol: 64 mg/dL (ref 0–99)
NONHDL: 89.02
Total CHOL/HDL Ratio: 2
Triglycerides: 124 mg/dL (ref 0.0–149.0)
VLDL: 24.8 mg/dL (ref 0.0–40.0)

## 2015-11-21 MED ORDER — ALPRAZOLAM 0.25 MG PO TABS: 0.2500 mg | ORAL_TABLET | ORAL | 1 refills | Status: DC | PRN

## 2015-11-21 MED ORDER — TRAMADOL HCL 50 MG PO TABS: 50.0000 mg | ORAL_TABLET | Freq: Three times a day (TID) | ORAL | 0 refills | Status: DC | PRN

## 2015-11-21 MED ORDER — MUPIROCIN 2 % EX OINT: 1.0000 "application " | TOPICAL_OINTMENT | Freq: Two times a day (BID) | CUTANEOUS | 0 refills | Status: DC

## 2015-11-21 NOTE — Patient Instructions (Signed)
Hypertension Hypertension, commonly called high blood pressure, is when the force of blood pumping through your arteries is too strong. Your arteries are the blood vessels that carry blood from your heart throughout your body. A blood pressure reading consists of a higher number over a lower number, such as 110/72. The higher number (systolic) is the pressure inside your arteries when your heart pumps. The lower number (diastolic) is the pressure inside your arteries when your heart relaxes. Ideally you want your blood pressure below 120/80. Hypertension forces your heart to work harder to pump blood. Your arteries may become narrow or stiff. Having untreated or uncontrolled hypertension can cause heart attack, stroke, kidney disease, and other problems. RISK FACTORS Some risk factors for high blood pressure are controllable. Others are not.  Risk factors you cannot control include:   Race. You may be at higher risk if you are African American.  Age. Risk increases with age.  Gender. Men are at higher risk than women before age 45 years. After age 65, women are at higher risk than men. Risk factors you can control include:  Not getting enough exercise or physical activity.  Being overweight.  Getting too much fat, sugar, calories, or salt in your diet.  Drinking too much alcohol. SIGNS AND SYMPTOMS Hypertension does not usually cause signs or symptoms. Extremely high blood pressure (hypertensive crisis) may cause headache, anxiety, shortness of breath, and nosebleed. DIAGNOSIS To check if you have hypertension, your health care provider will measure your blood pressure while you are seated, with your arm held at the level of your heart. It should be measured at least twice using the same arm. Certain conditions can cause a difference in blood pressure between your right and left arms. A blood pressure reading that is higher than normal on one occasion does not mean that you need treatment. If  it is not clear whether you have high blood pressure, you may be asked to return on a different day to have your blood pressure checked again. Or, you may be asked to monitor your blood pressure at home for 1 or more weeks. TREATMENT Treating high blood pressure includes making lifestyle changes and possibly taking medicine. Living a healthy lifestyle can help lower high blood pressure. You may need to change some of your habits. Lifestyle changes may include:  Following the DASH diet. This diet is high in fruits, vegetables, and whole grains. It is low in salt, red meat, and added sugars.  Keep your sodium intake below 2,300 mg per day.  Getting at least 30-45 minutes of aerobic exercise at least 4 times per week.  Losing weight if necessary.  Not smoking.  Limiting alcoholic beverages.  Learning ways to reduce stress. Your health care provider may prescribe medicine if lifestyle changes are not enough to get your blood pressure under control, and if one of the following is true:  You are 18-59 years of age and your systolic blood pressure is above 140.  You are 60 years of age or older, and your systolic blood pressure is above 150.  Your diastolic blood pressure is above 90.  You have diabetes, and your systolic blood pressure is over 140 or your diastolic blood pressure is over 90.  You have kidney disease and your blood pressure is above 140/90.  You have heart disease and your blood pressure is above 140/90. Your personal target blood pressure may vary depending on your medical conditions, your age, and other factors. HOME CARE INSTRUCTIONS    Have your blood pressure rechecked as directed by your health care provider.   Take medicines only as directed by your health care provider. Follow the directions carefully. Blood pressure medicines must be taken as prescribed. The medicine does not work as well when you skip doses. Skipping doses also puts you at risk for  problems.  Do not smoke.   Monitor your blood pressure at home as directed by your health care provider. SEEK MEDICAL CARE IF:   You think you are having a reaction to medicines taken.  You have recurrent headaches or feel dizzy.  You have swelling in your ankles.  You have trouble with your vision. SEEK IMMEDIATE MEDICAL CARE IF:  You develop a severe headache or confusion.  You have unusual weakness, numbness, or feel faint.  You have severe chest or abdominal pain.  You vomit repeatedly.  You have trouble breathing. MAKE SURE YOU:   Understand these instructions.  Will watch your condition.  Will get help right away if you are not doing well or get worse.   This information is not intended to replace advice given to you by your health care provider. Make sure you discuss any questions you have with your health care provider.   Document Released: 02/18/2005 Document Revised: 07/05/2014 Document Reviewed: 12/11/2012 Elsevier Interactive Patient Education 2016 [employer]  

## 2015-11-21 NOTE — Progress Notes (Signed)
Patient ID: Marissa Cruz, female    DOB: Nov 30, 1934  Age: 81 y.o. MRN: [id number]    Subjective:  Subjective  HPI Marissa Cruz presents for f/u bp and is still c/o of sore in R ear.  See last ov with Melissa. Her daughter is with her and they are concerned about her weight loss.   No other complaints .    Review of Systems  Constitutional: Negative for appetite change, diaphoresis, fatigue and unexpected weight change.  Eyes: Negative for pain, redness and visual disturbance.  Respiratory: Negative for cough, chest tightness, shortness of breath and wheezing.   Cardiovascular: Negative for chest pain, palpitations and leg swelling.  Endocrine: Negative for cold intolerance, heat intolerance, polydipsia, polyphagia and polyuria.  Genitourinary: Negative for difficulty urinating, dysuria and frequency.  Neurological: Negative for dizziness, light-headedness, numbness and headaches.    History Past Medical History:  Diagnosis Date  . Age-related macular degeneration, dry, right eye   . Age-related macular degeneration, wet, left eye ([place])   . Anemia   . Anxiety   . Arthritis    "thumbs, hands" (08/18/2014)  . CAP (community acquired pneumonia) 08/19/2014  . Carotid artery disease ([place])   . Chicken pox   . Chronic lower back pain   . Colon polyp   . Depression   . Diverticulitis   . GERD (gastroesophageal reflux disease)   . History of blood transfusion 1976; 2013   "w/hysterectomy; hip OR"  . History of gout   . Hypertension   . Kidney disease   . 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 TIA (transient ischemic attack) ~ 2009 X 2  . Vocal cord cancer ([place]) ~ 2009   S/P radiation    She has a past surgical history that includes Abdominal hysterectomy (1976); Total hip arthroplasty (Left, 2013); Carotid endarterectomy (Left, ~ 2014); Colonoscopy (N/A, 01/31/2014); Mouth floor mass excision (03/2014); Tonsillectomy (1941); Appendectomy (1954);  Joint replacement; Dilation and curettage of uterus; and Cataract extraction w/ intraocular lens  implant, bilateral (Bilateral, ~ 2009).   Her family history includes Hypertension in her father.She reports that she has quit smoking. Her smoking use included Cigarettes. She has a 30.00 pack-year smoking history. She has never used smokeless tobacco. She reports that she drinks about 8.4 oz of alcohol per week . She reports that she does not use drugs.  Current Outpatient Prescriptions on File Prior to Visit  Medication Sig Dispense Refill  . acetaminophen (TYLENOL) 650 MG CR tablet Take 1,300 mg by mouth at bedtime.    Marland Kitchen albuterol (PROVENTIL HFA;VENTOLIN HFA) 108 (90 BASE) MCG/ACT inhaler Inhale 2 puffs into the lungs every 6 (six) hours as needed for wheezing or shortness of breath. 1 Inhaler 2  . allopurinol (ZYLOPRIM) 100 MG tablet TAKE 1 TABLET BY MOUTH AT BEDTIME 90 tablet 1  . amLODipine (NORVASC) 5 MG tablet TAKE 1 TABLET BY MOUTH DAILY FOR HYPERTENSION 90 tablet 1  . aspirin 81 MG tablet Take 81 mg by mouth daily.    . Cholecalciferol 4000 UNITS CAPS Take 1 capsule by mouth every morning. Vitamin D 3    . diphenhydrAMINE (SIMPLY SLEEP) 25 MG tablet Take 25 mg by mouth at bedtime as needed for sleep.    Marland Kitchen docusate sodium (COLACE) 100 MG capsule Take 100 mg by mouth every morning.    . ferrous sulfate 325 (65 FE) MG tablet Take 325 mg by mouth 2 (two) times daily with a meal.     .  LORazepam (ATIVAN) 0.5 MG tablet Take 0.5 mg by mouth as needed for anxiety. Reported on 05/17/2015    . metoprolol succinate (TOPROL-XL) 25 MG 24 hr tablet TAKE 1 TABLET(25 MG) BY MOUTH EVERY MORNING 90 tablet 1  . mirtazapine (REMERON SOL-TAB) 15 MG disintegrating tablet DISSOLVE 1 TABLET ON THE TONGUE AT BEDTIME 90 tablet 0  . Multiple Vitamins-Minerals (HM MULTIVITAMIN ADULT GUMMY) CHEW Chew 2 tablets by mouth every morning.    Marland Kitchen omeprazole (PRILOSEC) 20 MG capsule TAKE ONE CAPSULE BY MOUTH TWICE DAILY BEFORE A  MEAL 60 capsule 11  . ondansetron (ZOFRAN-ODT) 8 MG disintegrating tablet Take 8 mg by mouth as needed for nausea or vomiting. Reported on 05/17/2015    . PARoxetine (PAXIL) 10 MG tablet TAKE 1 TABLET BY MOUTH DAILY FOR DEPRESSION 90 tablet 1  . [name] 0.625 MG tablet TAKE 1 TABLET BY MOUTH EVERY DAY FOR 21 DAYS, THEN DO NOT TAKE FOR 7 DAYS 90 tablet 0  . sulfamethoxazole-trimethoprim (BACTRIM DS,SEPTRA DS) 800-160 MG tablet Take 1 tablet by mouth 2 (two) times daily. 14 tablet 0  . traMADol (ULTRAM) 50 MG tablet Take 1 tablet (50 mg total) by mouth every 12 (twelve) hours as needed. 8 tablet 0  . traZODone (DESYREL) 50 MG tablet TAKE 1 TABLET(50 MG) BY MOUTH AT BEDTIME 90 tablet 0   No current facility-administered medications on file prior to visit.      Objective:  Objective  Physical Exam  Constitutional: She is oriented to person, place, and time. She appears well-developed and well-nourished.  HENT:  Head: Normocephalic and atraumatic.  Ears:  Eyes: Conjunctivae and EOM are normal.  Neck: Normal range of motion. Neck supple. No JVD present. Carotid bruit is not present. No thyromegaly present.  Cardiovascular: Normal rate, regular rhythm and normal heart sounds.   No murmur heard. Pulmonary/Chest: Effort normal and breath sounds normal. No respiratory distress. She has no wheezes. She has no rales. She exhibits no tenderness.  Musculoskeletal: She exhibits no edema.  Neurological: She is alert and oriented to person, place, and time.  Psychiatric: She has a normal mood and affect.  Nursing note and vitals reviewed.  BP (!) 160/80 (BP Location: Left Arm, Patient Position: Sitting, Cuff Size: Normal)   Pulse 96   Temp 98.7 F (37.1 C) (Oral)   Resp 17   Ht '5\' 7"'$  (1.702 m)   Wt 100 lb 9.6 oz (45.6 kg)   SpO2 93%   BMI 15.76 kg/m  Wt Readings from Last 3 Encounters:  11/21/15 100 lb 9.6 oz (45.6 kg)  10/09/15 104 lb (47.2 kg)  05/18/15 116 lb 12.8 oz (53 kg)     Lab  Results  Component Value Date   WBC 11.9 (H) 11/21/2015   HGB 11.1 (L) 11/21/2015   HCT 34.5 (L) 11/21/2015   PLT 488.0 (H) 11/21/2015   GLUCOSE 96 11/21/2015   CHOL 183 11/21/2015   TRIG 124.0 11/21/2015   HDL 94.30 11/21/2015   LDLDIRECT 73.2 10/21/2013   LDLCALC 64 11/21/2015   ALT 10 11/21/2015   AST 19 11/21/2015   NA 137 11/21/2015   K 5.1 11/21/2015   CL 97 11/21/2015   CREATININE 1.28 (H) 11/21/2015   BUN 17 11/21/2015   CO2 33 (H) 11/21/2015   TSH 3.95 05/24/2014    Dg Ribs Unilateral W/chest Left  Result Date: 01/30/2015 CLINICAL DATA:  Pt states she has had lower left sided rib pain since Friday. Describes pain as sharp. Denies  injury or cough. States pain is posterior and anterior. Hx of htn and cad. EXAM: LEFT RIBS AND CHEST - 3+ VIEW COMPARISON:  08/31/2014 FINDINGS: No rib fracture or rib lesion.  Bony thorax is demineralized. Lungs are hyperexpanded with minor pleural parenchymal scarring at the apices. Lungs otherwise clear. No pleural effusion or pneumothorax. Heart, mediastinum hila are unremarkable. IMPRESSION: 1. No rib fracture or rib lesion. 2. No acute cardiopulmonary disease. Electronically Signed   By: Lajean Manes M.D.   On: 01/30/2015 15:53     Assessment & Plan:  Plan  I have discontinued Ms. Tabora's beta carotene w/minerals and cephALEXin. I have also changed her ALPRAZolam. Additionally, I am having her start on traMADol and mupirocin ointment. Lastly, I am having her maintain her ferrous sulfate, docusate sodium, aspirin, Cholecalciferol, HM MULTIVITAMIN ADULT GUMMY, acetaminophen, diphenhydrAMINE, ondansetron, LORazepam, albuterol, traMADol, sulfamethoxazole-trimethoprim, PARoxetine, amLODipine, metoprolol succinate, allopurinol, omeprazole, mirtazapine, [name], and traZODone.  Meds ordered this encounter  Medications  . ALPRAZolam (XANAX) 0.25 MG tablet    Sig: Take 1 tablet (0.25 mg total) by mouth as needed for anxiety.    Dispense:  30  tablet    Refill:  1  . traMADol (ULTRAM) 50 MG tablet    Sig: Take 1 tablet (50 mg total) by mouth every 8 (eight) hours as needed.    Dispense:  30 tablet    Refill:  0  . mupirocin ointment (BACTROBAN) 2 %    Sig: Place 1 application into the nose 2 (two) times daily.    Dispense:  22 g    Refill:  0    Problem List Items Addressed This Visit      Unprioritized   Generalized anxiety disorder   Relevant Medications   ALPRAZolam (XANAX) 0.25 MG tablet   CKD (chronic kidney disease) stage 4, GFR 15-29 ml/min (HCC) (Chronic)    Check labs Encourage water intake      HTN (hypertension) - Primary    bp running lower at home Current Outpatient Prescriptions on File Prior to Visit  Medication Sig Dispense Refill  . acetaminophen (TYLENOL) 650 MG CR tablet Take 1,300 mg by mouth at bedtime.    Marland Kitchen albuterol (PROVENTIL HFA;VENTOLIN HFA) 108 (90 BASE) MCG/ACT inhaler Inhale 2 puffs into the lungs every 6 (six) hours as needed for wheezing or shortness of breath. 1 Inhaler 2  . allopurinol (ZYLOPRIM) 100 MG tablet TAKE 1 TABLET BY MOUTH AT BEDTIME 90 tablet 1  . amLODipine (NORVASC) 5 MG tablet TAKE 1 TABLET BY MOUTH DAILY FOR HYPERTENSION 90 tablet 1  . aspirin 81 MG tablet Take 81 mg by mouth daily.    . Cholecalciferol 4000 UNITS CAPS Take 1 capsule by mouth every morning. Vitamin D 3    . diphenhydrAMINE (SIMPLY SLEEP) 25 MG tablet Take 25 mg by mouth at bedtime as needed for sleep.    Marland Kitchen docusate sodium (COLACE) 100 MG capsule Take 100 mg by mouth every morning.    . ferrous sulfate 325 (65 FE) MG tablet Take 325 mg by mouth 2 (two) times daily with a meal.     . LORazepam (ATIVAN) 0.5 MG tablet Take 0.5 mg by mouth as needed for anxiety. Reported on 05/17/2015    . metoprolol succinate (TOPROL-XL) 25 MG 24 hr tablet TAKE 1 TABLET(25 MG) BY MOUTH EVERY MORNING 90 tablet 1  . mirtazapine (REMERON SOL-TAB) 15 MG disintegrating tablet DISSOLVE 1 TABLET ON THE TONGUE AT BEDTIME 90 tablet 0    .  Multiple Vitamins-Minerals (HM MULTIVITAMIN ADULT GUMMY) CHEW Chew 2 tablets by mouth every morning.    Marland Kitchen omeprazole (PRILOSEC) 20 MG capsule TAKE ONE CAPSULE BY MOUTH TWICE DAILY BEFORE A MEAL 60 capsule 11  . ondansetron (ZOFRAN-ODT) 8 MG disintegrating tablet Take 8 mg by mouth as needed for nausea or vomiting. Reported on 05/17/2015    . PARoxetine (PAXIL) 10 MG tablet TAKE 1 TABLET BY MOUTH DAILY FOR DEPRESSION 90 tablet 1  . [name] 0.625 MG tablet TAKE 1 TABLET BY MOUTH EVERY DAY FOR 21 DAYS, THEN DO NOT TAKE FOR 7 DAYS 90 tablet 0  . sulfamethoxazole-trimethoprim (BACTRIM DS,SEPTRA DS) 800-160 MG tablet Take 1 tablet by mouth 2 (two) times daily. 14 tablet 0  . traMADol (ULTRAM) 50 MG tablet Take 1 tablet (50 mg total) by mouth every 12 (twelve) hours as needed. 8 tablet 0  . traZODone (DESYREL) 50 MG tablet TAKE 1 TABLET(50 MG) BY MOUTH AT BEDTIME 90 tablet 0   No current facility-administered medications on file prior to visit.         Relevant Orders   Comprehensive metabolic panel (Completed)   Lipid panel (Completed)   CBC with Differential/Platelet (Completed)   POCT urinalysis dipstick (Completed)   Protein-calorie malnutrition ([place])    Inc protein shakes to 2-3 a day  D/w daughter and pt-- encourage pt to eat several times a day and to drink more water       Other Visit Diagnoses    Encounter for immunization       Primary osteoarthritis involving multiple joints       Relevant Medications   traMADol (ULTRAM) 50 MG tablet   Cyst on ear       Relevant Medications   mupirocin ointment (BACTROBAN) 2 %    flu shot given today  Follow-up: Return in about 3 months (around 02/20/2016) for hypertension.  Ann Held, DO

## 2015-11-21 NOTE — Progress Notes (Signed)
Pre visit review using our clinic review tool, if applicable. No additional management support is needed unless otherwise documented below in the visit note. 

## 2015-11-22 NOTE — Assessment & Plan Note (Signed)
Check labs Encourage water intake

## 2015-11-22 NOTE — Assessment & Plan Note (Signed)
Inc protein shakes to 2-3 a day  D/w daughter and pt-- encourage pt to eat several times a day and to drink more water

## 2015-11-22 NOTE — Assessment & Plan Note (Signed)
bp running lower at home Current Outpatient Prescriptions on File Prior to Visit  Medication Sig Dispense Refill  . acetaminophen (TYLENOL) 650 MG CR tablet Take 1,300 mg by mouth at bedtime.    Marland Kitchen albuterol (PROVENTIL HFA;VENTOLIN HFA) 108 (90 BASE) MCG/ACT inhaler Inhale 2 puffs into the lungs every 6 (six) hours as needed for wheezing or shortness of breath. 1 Inhaler 2  . allopurinol (ZYLOPRIM) 100 MG tablet TAKE 1 TABLET BY MOUTH AT BEDTIME 90 tablet 1  . amLODipine (NORVASC) 5 MG tablet TAKE 1 TABLET BY MOUTH DAILY FOR HYPERTENSION 90 tablet 1  . aspirin 81 MG tablet Take 81 mg by mouth daily.    . Cholecalciferol 4000 UNITS CAPS Take 1 capsule by mouth every morning. Vitamin D 3    . diphenhydrAMINE (SIMPLY SLEEP) 25 MG tablet Take 25 mg by mouth at bedtime as needed for sleep.    Marland Kitchen docusate sodium (COLACE) 100 MG capsule Take 100 mg by mouth every morning.    . ferrous sulfate 325 (65 FE) MG tablet Take 325 mg by mouth 2 (two) times daily with a meal.     . LORazepam (ATIVAN) 0.5 MG tablet Take 0.5 mg by mouth as needed for anxiety. Reported on 05/17/2015    . metoprolol succinate (TOPROL-XL) 25 MG 24 hr tablet TAKE 1 TABLET(25 MG) BY MOUTH EVERY MORNING 90 tablet 1  . mirtazapine (REMERON SOL-TAB) 15 MG disintegrating tablet DISSOLVE 1 TABLET ON THE TONGUE AT BEDTIME 90 tablet 0  . Multiple Vitamins-Minerals (HM MULTIVITAMIN ADULT GUMMY) CHEW Chew 2 tablets by mouth every morning.    Marland Kitchen omeprazole (PRILOSEC) 20 MG capsule TAKE ONE CAPSULE BY MOUTH TWICE DAILY BEFORE A MEAL 60 capsule 11  . ondansetron (ZOFRAN-ODT) 8 MG disintegrating tablet Take 8 mg by mouth as needed for nausea or vomiting. Reported on 05/17/2015    . PARoxetine (PAXIL) 10 MG tablet TAKE 1 TABLET BY MOUTH DAILY FOR DEPRESSION 90 tablet 1  . [name] 0.625 MG tablet TAKE 1 TABLET BY MOUTH EVERY DAY FOR 21 DAYS, THEN DO NOT TAKE FOR 7 DAYS 90 tablet 0  . sulfamethoxazole-trimethoprim (BACTRIM DS,SEPTRA DS) 800-160 MG tablet  Take 1 tablet by mouth 2 (two) times daily. 14 tablet 0  . traMADol (ULTRAM) 50 MG tablet Take 1 tablet (50 mg total) by mouth every 12 (twelve) hours as needed. 8 tablet 0  . traZODone (DESYREL) 50 MG tablet TAKE 1 TABLET(50 MG) BY MOUTH AT BEDTIME 90 tablet 0   No current facility-administered medications on file prior to visit.

## 2015-11-28 DIAGNOSIS — H353112 Nonexudative age-related macular degeneration, right eye, intermediate dry stage: Secondary | ICD-10-CM | POA: Diagnosis not present

## 2015-11-28 DIAGNOSIS — H353222 Exudative age-related macular degeneration, left eye, with inactive choroidal neovascularization: Secondary | ICD-10-CM | POA: Diagnosis not present

## 2015-12-07 ENCOUNTER — Encounter (HOSPITAL_BASED_OUTPATIENT_CLINIC_OR_DEPARTMENT_OTHER): Payer: Self-pay | Admitting: *Deleted

## 2015-12-07 ENCOUNTER — Emergency Department (HOSPITAL_BASED_OUTPATIENT_CLINIC_OR_DEPARTMENT_OTHER)
Admission: EM | Admit: 2015-12-07 | Discharge: 2015-12-07 | Disposition: A | Discharge status: Home / Self Care | Payer: Medicare Other | Attending: Emergency Medicine | Admitting: Emergency Medicine

## 2015-12-07 ENCOUNTER — Emergency Department (HOSPITAL_BASED_OUTPATIENT_CLINIC_OR_DEPARTMENT_OTHER): Payer: Medicare Other

## 2015-12-07 DIAGNOSIS — J181 Lobar pneumonia, unspecified organism: Secondary | ICD-10-CM | POA: Diagnosis not present

## 2015-12-07 DIAGNOSIS — Z87891 Personal history of nicotine dependence: Secondary | ICD-10-CM | POA: Insufficient documentation

## 2015-12-07 DIAGNOSIS — I129 Hypertensive chronic kidney disease with stage 1 through stage 4 chronic kidney disease, or unspecified chronic kidney disease: Secondary | ICD-10-CM | POA: Insufficient documentation

## 2015-12-07 DIAGNOSIS — Z8521 Personal history of malignant neoplasm of larynx: Secondary | ICD-10-CM | POA: Diagnosis not present

## 2015-12-07 DIAGNOSIS — N184 Chronic kidney disease, stage 4 (severe): Secondary | ICD-10-CM | POA: Insufficient documentation

## 2015-12-07 DIAGNOSIS — Z791 Long term (current) use of non-steroidal anti-inflammatories (NSAID): Secondary | ICD-10-CM | POA: Insufficient documentation

## 2015-12-07 DIAGNOSIS — I2510 Atherosclerotic heart disease of native coronary artery without angina pectoris: Secondary | ICD-10-CM | POA: Insufficient documentation

## 2015-12-07 DIAGNOSIS — R05 Cough: Secondary | ICD-10-CM | POA: Diagnosis not present

## 2015-12-07 DIAGNOSIS — J189 Pneumonia, unspecified organism: Secondary | ICD-10-CM | POA: Diagnosis not present

## 2015-12-07 DIAGNOSIS — Z85818 Personal history of malignant neoplasm of other sites of lip, oral cavity, and pharynx: Secondary | ICD-10-CM | POA: Diagnosis not present

## 2015-12-07 DIAGNOSIS — Z79899 Other long term (current) drug therapy: Secondary | ICD-10-CM | POA: Insufficient documentation

## 2015-12-07 LAB — CBC WITH DIFFERENTIAL/PLATELET
BASOS ABS: 0.1 10*3/uL (ref 0.0–0.1)
Basophils Relative: 1 %
Eosinophils Absolute: 0.2 10*3/uL (ref 0.0–0.7)
Eosinophils Relative: 2 %
HEMATOCRIT: 33.9 % — AB (ref 36.0–46.0)
Hemoglobin: 10.9 g/dL — ABNORMAL LOW (ref 12.0–15.0)
LYMPHS ABS: 1 10*3/uL (ref 0.7–4.0)
LYMPHS PCT: 10 %
MCH: 30.6 pg (ref 26.0–34.0)
MCHC: 32.2 g/dL (ref 30.0–36.0)
MCV: 95.2 fL (ref 78.0–100.0)
MONO ABS: 0.7 10*3/uL (ref 0.1–1.0)
Monocytes Relative: 7 %
NEUTROS ABS: 8.3 10*3/uL — AB (ref 1.7–7.7)
Neutrophils Relative %: 81 %
Platelets: 441 10*3/uL — ABNORMAL HIGH (ref 150–400)
RBC: 3.56 MIL/uL — ABNORMAL LOW (ref 3.87–5.11)
RDW: 13.2 % (ref 11.5–15.5)
WBC: 10.3 10*3/uL (ref 4.0–10.5)

## 2015-12-07 LAB — BASIC METABOLIC PANEL
ANION GAP: 11 (ref 5–15)
BUN: 19 mg/dL (ref 6–20)
CALCIUM: 8.6 mg/dL — AB (ref 8.9–10.3)
CHLORIDE: 96 mmol/L — AB (ref 101–111)
CO2: 24 mmol/L (ref 22–32)
CREATININE: 1.26 mg/dL — AB (ref 0.44–1.00)
GFR calc Af Amer: 46 mL/min — ABNORMAL LOW (ref >60)
GFR calc non Af Amer: 39 mL/min — ABNORMAL LOW (ref >60)
GLUCOSE: 101 mg/dL — AB (ref 65–99)
POTASSIUM: 3.6 mmol/L (ref 3.5–5.1)
Sodium: 131 mmol/L — ABNORMAL LOW (ref 135–145)

## 2015-12-07 MED ORDER — HYDROCODONE-ACETAMINOPHEN 7.5-325 MG/15ML PO SOLN: 10.0000 mL | Freq: Four times a day (QID) | ORAL | 0 refills | Status: DC | PRN

## 2015-12-07 MED ORDER — SODIUM CHLORIDE 0.9 % IV BOLUS (SEPSIS)
1000.0000 mL | Freq: Once | INTRAVENOUS | Status: AC
  Administered 2015-12-07: 1000 mL via INTRAVENOUS

## 2015-12-07 MED ORDER — LEVOFLOXACIN 500 MG PO TABS
500.0000 mg | ORAL_TABLET | Freq: Every day | ORAL | Status: DC
Start: 2015-12-07 — End: 2015-12-07
  Administered 2015-12-07: 500 mg via ORAL
  Filled 2015-12-07: qty 1

## 2015-12-07 MED ORDER — LEVOFLOXACIN 500 MG PO TABS
500.0000 mg | ORAL_TABLET | Freq: Every day | ORAL | Status: DC
  Filled 2015-12-07: qty 1

## 2015-12-07 MED ORDER — LEVOFLOXACIN 500 MG PO TABS: 500.0000 mg | ORAL_TABLET | Freq: Every day | ORAL | 0 refills | Status: DC

## 2015-12-07 MED ORDER — ONDANSETRON HCL 4 MG/2ML IJ SOLN
4.0000 mg | Freq: Once | INTRAMUSCULAR | Status: AC
  Administered 2015-12-07: 4 mg via INTRAVENOUS
  Filled 2015-12-07: qty 2

## 2015-12-07 MED FILL — HYDROCOD-APAP 7.5-325/15ML: 7.5-325 | 3 days supply | Qty: 120 | Fill #0

## 2015-12-07 MED FILL — levoFLOXacin 500 MG TABS: 500 | 10 days supply | Qty: 10 | Fill #0

## 2015-12-07 NOTE — ED Notes (Addendum)
Charted on wrong pt.

## 2015-12-07 NOTE — ED Notes (Signed)
Pt finishing IV fluids before being discharged.

## 2015-12-07 NOTE — ED Notes (Signed)
Patient undressed and placed in a gown. Patient also placed on monitor with vitals set for every 30 minutes.

## 2015-12-07 NOTE — ED Triage Notes (Signed)
Pain in her back x 2 weeks. Cough and fever x 4 days. Hx of pneumonia a year ago.

## 2015-12-07 NOTE — ED Provider Notes (Signed)
[facility] Provider Note   CSN: [id number] Arrival date & time: 12/07/15  1053     History   Chief Complaint Chief Complaint  Patient presents with  . Cough    HPI Marissa Cruz is a 81 y.o. female.  She presents with chief complaint of back pain and cough. She's had a cough for the last few weeks. Produces thin yellow sputum. States she now hurts in her back when she coughs. States that his right and left in her back "in my lower ribs". Does not feel short of breath but feels fatigue. No pain or pressure chest. No swelling in her extremities. Poor appetite but eating and drinking okay.Marland Kitchen  HPI  Past Medical History:  Diagnosis Date  . Age-related macular degeneration, dry, right eye   . Age-related macular degeneration, wet, left eye ([place])   . Anemia   . Anxiety   . Arthritis    "thumbs, hands" (08/18/2014)  . CAP (community acquired pneumonia) 08/19/2014  . Carotid artery disease ([place])   . Chicken pox   . Chronic lower back pain   . Colon polyp   . Depression   . Diverticulitis   . GERD (gastroesophageal reflux disease)   . History of blood transfusion 1976; 2013   "w/hysterectomy; hip OR"  . History of gout   . Hypertension   . Kidney disease   . 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 TIA (transient ischemic attack) ~ 2009 X 2  . Vocal cord cancer ([place]) ~ 2009   S/P radiation    Patient Active Problem List   Diagnosis Date Noted  . Protein-calorie malnutrition ([place]) 08/20/2014  . Pressure ulcer 08/20/2014  . CAP (community acquired pneumonia) 08/19/2014  . Tobacco use disorder 08/19/2014  . CKD (chronic kidney disease) stage 4, GFR 15-29 ml/min (H[facility]) 08/19/2014  . Occult blood in stools 01/31/2014  . Anemia 01/31/2014  . Benign neoplasm of transverse colon 01/31/2014  . Hematuria 01/07/2014  . Leukocytes in urine 01/07/2014  . Acute bronchitis 01/07/2014  . Back pain 01/07/2014  . Perthe's disease of hip  10/21/2013  . HTN (hypertension) 10/21/2013  . Generalized anxiety disorder 10/21/2013  . Gout 10/21/2013  . GERD (gastroesophageal reflux disease) 10/21/2013    Past Surgical History:  Procedure Laterality Date  . ABDOMINAL HYSTERECTOMY  1976  . APPENDECTOMY  1954  . CAROTID ENDARTERECTOMY Left ~ 2014  . CATARACT EXTRACTION W/ INTRAOCULAR LENS  IMPLANT, BILATERAL Bilateral ~ 2009  . COLONOSCOPY N/A 01/31/2014   Procedure: COLONOSCOPY;  Surgeon: Ladene Artist, MD;  Location: [facility];  Service: Endoscopy;  Laterality: N/A;  . DILATION AND CURETTAGE OF UTERUS    . JOINT REPLACEMENT    . MOUTH FLOOR MASS EXCISION  03/2014   "removed cancer underneath my tongue"  . TONSILLECTOMY  1941  . TOTAL HIP ARTHROPLASTY Left 2013    OB History    No data available       Home Medications    Prior to Admission medications   Medication Sig Start Date End Date Taking? Authorizing Provider  acetaminophen (TYLENOL) 650 MG CR tablet Take 1,300 mg by mouth at bedtime.   Yes Historical Provider, MD  albuterol (PROVENTIL HFA;VENTOLIN HFA) 108 (90 BASE) MCG/ACT inhaler Inhale 2 puffs into the lungs every 6 (six) hours as needed for wheezing or shortness of breath. 08/22/14  Yes Annita Brod, MD  allopurinol (ZYLOPRIM) 100 MG tablet TAKE 1 TABLET BY  MOUTH AT BEDTIME 07/26/15  Yes Yvonne R Lowne Chase, DO  ALPRAZolam Duanne Moron) 0.25 MG tablet Take 1 tablet (0.25 mg total) by mouth as needed for anxiety. 11/21/15  Yes Yvonne R Lowne Chase, DO  amLODipine (NORVASC) 5 MG tablet TAKE 1 TABLET BY MOUTH DAILY FOR HYPERTENSION 06/19/15  Yes Yvonne R Lowne Chase, DO  aspirin 81 MG tablet Take 81 mg by mouth daily.   Yes Historical Provider, MD  Cholecalciferol 4000 UNITS CAPS Take 1 capsule by mouth every morning. Vitamin D 3   Yes Historical Provider, MD  diphenhydrAMINE (SIMPLY SLEEP) 25 MG tablet Take 25 mg by mouth at bedtime as needed for sleep.   Yes Historical Provider, MD  docusate sodium (COLACE)  100 MG capsule Take 100 mg by mouth every morning.   Yes Historical Provider, MD  ferrous sulfate 325 (65 FE) MG tablet Take 325 mg by mouth 2 (two) times daily with a meal.    Yes Historical Provider, MD  LORazepam (ATIVAN) 0.5 MG tablet Take 0.5 mg by mouth as needed for anxiety. Reported on 05/17/2015   Yes Historical Provider, MD  metoprolol succinate (TOPROL-XL) 25 MG 24 hr tablet TAKE 1 TABLET(25 MG) BY MOUTH EVERY MORNING 06/19/15  Yes Yvonne R Lowne Chase, DO  mirtazapine (REMERON SOL-TAB) 15 MG disintegrating tablet DISSOLVE 1 TABLET ON THE TONGUE AT BEDTIME 09/18/15  Yes Yvonne R Lowne Chase, DO  Multiple Vitamins-Minerals (HM MULTIVITAMIN ADULT GUMMY) CHEW Chew 2 tablets by mouth every morning.   Yes Historical Provider, MD  omeprazole (PRILOSEC) 20 MG capsule TAKE ONE CAPSULE BY MOUTH TWICE DAILY BEFORE A MEAL 08/25/15  Yes Yvonne R Lowne Chase, DO  ondansetron (ZOFRAN-ODT) 8 MG disintegrating tablet Take 8 mg by mouth as needed for nausea or vomiting. Reported on 05/17/2015   Yes Historical Provider, MD  PARoxetine (PAXIL) 10 MG tablet TAKE 1 TABLET BY MOUTH DAILY FOR DEPRESSION 06/19/15  Yes Yvonne R Lowne Chase, DO  [name] 0.625 MG tablet TAKE 1 TABLET BY MOUTH EVERY DAY FOR 21 DAYS, THEN DO NOT TAKE FOR 7 DAYS 09/20/15  Yes Yvonne R Lowne Chase, DO  traMADol (ULTRAM) 50 MG tablet Take 1 tablet (50 mg total) by mouth every 12 (twelve) hours as needed. 01/30/15  Yes Edward Saguier, PA-C  traMADol (ULTRAM) 50 MG tablet Take 1 tablet (50 mg total) by mouth every 8 (eight) hours as needed. 11/21/15  Yes Yvonne R Lowne Chase, DO  traZODone (DESYREL) 50 MG tablet TAKE 1 TABLET(50 MG) BY MOUTH AT BEDTIME 09/25/15  Yes Yvonne R Lowne Chase, DO  HYDROcodone-acetaminophen (HYCET) 7.5-325 mg/15 ml solution Take 10 mLs by mouth every 6 (six) hours as needed for moderate pain (pain or cough). 12/07/15 12/06/16  Tanna Furry, MD  levofloxacin (LEVAQUIN) 500 MG tablet Take 1 tablet (500 mg total) by mouth daily.  12/07/15   Tanna Furry, MD  mupirocin ointment (BACTROBAN) 2 % Place 1 application into the nose 2 (two) times daily. 11/21/15   Rosalita Chessman Chase, DO  sulfamethoxazole-trimethoprim (BACTRIM DS,SEPTRA DS) 800-160 MG tablet Take 1 tablet by mouth 2 (two) times daily. 05/24/15   Ann Held, DO    Family History Family History  Problem Relation Age of Onset  . Hypertension Father     Social History Social History  Substance Use Topics  . Smoking status: Former Smoker    Packs/day: 0.50    Years: 60.00    Types: Cigarettes  . Smokeless tobacco: Never Used  .  Alcohol use 8.4 oz/week    14 Shots of liquor per week     Comment: 08/19/2014 "2 shots brandy q night"     Allergies   Morphine and related and Codeine   Review of Systems Review of Systems  Constitutional: Positive for appetite change. Negative for chills, diaphoresis, fatigue and fever.  HENT: Negative for mouth sores, sore throat and trouble swallowing.   Eyes: Negative for visual disturbance.  Respiratory: Positive for cough. Negative for chest tightness, shortness of breath and wheezing.   Cardiovascular: Positive for chest pain.  Gastrointestinal: Negative for abdominal distention, abdominal pain, diarrhea, nausea and vomiting.  Endocrine: Negative for polydipsia, polyphagia and polyuria.  Genitourinary: Negative for dysuria, frequency and hematuria.  Musculoskeletal: Positive for back pain. Negative for gait problem.  Skin: Negative for color change, pallor and rash.  Neurological: Negative for dizziness, syncope, light-headedness and headaches.  Hematological: Does not bruise/bleed easily.  Psychiatric/Behavioral: Negative for behavioral problems and confusion.     Physical Exam Updated Vital Signs BP 166/81   Pulse 102   Temp 98.1 F (36.7 C) (Oral)   Resp 17   Ht '5\' 7"'$  (1.702 m)   Wt 95 lb (43.1 kg)   SpO2 95%   BMI 14.88 kg/m   Physical Exam  Constitutional: She is oriented to person,  place, and time. She appears well-developed and well-nourished. No distress.  Awake alert no acute distress.  HENT:  Head: Normocephalic.  Eyes: Conjunctivae are normal. Pupils are equal, round, and reactive to light. No scleral icterus.  Neck: Normal range of motion. Neck supple. No thyromegaly present.  Cardiovascular: Normal rate and regular rhythm.  Exam reveals no gallop and no friction rub.   No murmur heard. Pulmonary/Chest: Effort normal and breath sounds normal. No respiratory distress. She has no wheezes. She has no rales.  Clear bilateral breath sounds. No wheezing rales or rhonchi.  Abdominal: Soft. Bowel sounds are normal. She exhibits no distension. There is no tenderness. There is no rebound.  Musculoskeletal: Normal range of motion.  Neurological: She is alert and oriented to person, place, and time.  Skin: Skin is warm and dry. No rash noted.  Thin lower extremities. No edema. No asymmetry.  Psychiatric: She has a normal mood and affect. Her behavior is normal.     ED Treatments / Results  Labs (all labs ordered are listed, but only abnormal results are displayed) Labs Reviewed  CBC WITH DIFFERENTIAL/PLATELET - Abnormal; Notable for the following:       Result Value   RBC 3.56 (*)    Hemoglobin 10.9 (*)    HCT 33.9 (*)    Platelets 441 (*)    Neutro Abs 8.3 (*)    All other components within normal limits  BASIC METABOLIC PANEL - Abnormal; Notable for the following:    Sodium 131 (*)    Chloride 96 (*)    Glucose, Bld 101 (*)    Creatinine, Ser 1.26 (*)    Calcium 8.6 (*)    GFR calc non Af Amer 39 (*)    GFR calc Af Amer 46 (*)    All other components within normal limits    EKG  EKG Interpretation  Date/Time:  Thursday December 07 2015 11:01:24 EDT Ventricular Rate:  116 PR Interval:    QRS Duration: 91 QT Interval:  325 QTC Calculation: 446 R Axis:   23 Text Interpretation:  Sinus tachycardia Multiple premature complexes, vent & supraven Aberrant  conduction of SV complex(es)  RSR' in V1 or V2, probably normal variant Baseline wander in lead(s) V5 Confirmed by Jeneen Rinks  MD, [place] ([id number]) on 12/07/2015 11:20:47 AM       Radiology Dg Chest 2 View  Result Date: 12/07/2015 CLINICAL DATA:  Cough, congestion for 4 days, smoking history EXAM: CHEST  2 VIEW COMPARISON:  Chest x-ray of 01/30/2015 FINDINGS: There is abnormal opacity medially at the right lung base suspicious for pneumonia. Followup chest x-ray is recommended to ensure clearing. The left lung is clear and somewhat hyperaerated. Mediastinal and hilar contours are unremarkable. The heart is within normal limits in size. No acute bony abnormality is seen. IMPRESSION: Opacity medially at the right lung base most likely represents pneumonia. Recommend followup after interval treatment to ensure clearing. Electronically Signed   By: Ivar Drape M.D.   On: 12/07/2015 11:13    Procedures Procedures (including critical care time)  Medications Ordered in ED Medications  levofloxacin (LEVAQUIN) tablet 500 mg (500 mg Oral Not Given 12/07/15 1254)  ondansetron (ZOFRAN) injection 4 mg (4 mg Intravenous Given 12/07/15 1135)  sodium chloride 0.9 % bolus 1,000 mL (1,000 mLs Intravenous New Bag/Given 12/07/15 1256)     Initial Impression / Assessment and Plan / ED Course  I have reviewed the triage vital signs and the nursing notes.  Pertinent labs & imaging results that were available during my care of the patient were reviewed by me and considered in my medical decision making (see chart for details).  Clinical Course    Chest x-ray shows subtle small right lower lobe infiltrate. No para pneumonic effusion. Not concerned that this represents pulmonary embolus or ACS. Remainder of her labs are reassuring. No changes on EKG. Given by mouth Levaquin. Given IV fluid and IV Zofran. Taking by mouth. Appropriate for discharge home treatment with Levaquin, Lortab, primary care follow-up.  Final Clinical  Impressions(s) / ED Diagnoses   Final diagnoses:  Community acquired pneumonia of right lower lobe of lung ([place])    New Prescriptions New Prescriptions   HYDROCODONE-ACETAMINOPHEN (HYCET) 7.5-325 MG/15 ML SOLUTION    Take 10 mLs by mouth every 6 (six) hours as needed for moderate pain (pain or cough).   LEVOFLOXACIN (LEVAQUIN) 500 MG TABLET    Take 1 tablet (500 mg total) by mouth daily.     Tanna Furry, MD 12/07/15 [phone]

## 2015-12-07 NOTE — Discharge Instructions (Signed)
Lortab for pain or cough.  Stop antibiotic if you develop any joint pain and follow-up with your physician.  Routine follow up with your physician within 2 weeks  Return to ER with worsening symptoms

## 2015-12-11 DIAGNOSIS — J189 Pneumonia, unspecified organism: Secondary | ICD-10-CM | POA: Diagnosis not present

## 2015-12-11 DIAGNOSIS — Z79899 Other long term (current) drug therapy: Secondary | ICD-10-CM | POA: Diagnosis not present

## 2015-12-11 DIAGNOSIS — Z85818 Personal history of malignant neoplasm of other sites of lip, oral cavity, and pharynx: Secondary | ICD-10-CM | POA: Diagnosis not present

## 2015-12-11 DIAGNOSIS — D490 Neoplasm of unspecified behavior of digestive system: Secondary | ICD-10-CM | POA: Diagnosis not present

## 2015-12-11 DIAGNOSIS — Z08 Encounter for follow-up examination after completed treatment for malignant neoplasm: Secondary | ICD-10-CM | POA: Diagnosis not present

## 2015-12-12 ENCOUNTER — Other Ambulatory Visit: Payer: Self-pay | Admitting: Family Medicine

## 2015-12-17 ENCOUNTER — Other Ambulatory Visit: Payer: Self-pay | Admitting: Family Medicine

## 2015-12-18 ENCOUNTER — Encounter: Payer: Self-pay | Admitting: Family

## 2015-12-18 ENCOUNTER — Ambulatory Visit (HOSPITAL_BASED_OUTPATIENT_CLINIC_OR_DEPARTMENT_OTHER)
Admission: RE | Admit: 2015-12-18 | Discharge: 2015-12-18 | Disposition: A | Discharge status: Home / Self Care | Payer: Medicare Other | Source: Ambulatory Visit | Attending: Family | Admitting: Family

## 2015-12-18 ENCOUNTER — Ambulatory Visit (INDEPENDENT_AMBULATORY_CARE_PROVIDER_SITE_OTHER): Payer: Medicare Other | Admitting: Family

## 2015-12-18 ENCOUNTER — Telehealth: Payer: Self-pay | Admitting: Family

## 2015-12-18 VITALS — BP 112/56 | HR 92 | Temp 98.1°F | Resp 18 | Ht 67.0 in | Wt 99.4 lb

## 2015-12-18 DIAGNOSIS — R918 Other nonspecific abnormal finding of lung field: Secondary | ICD-10-CM | POA: Diagnosis not present

## 2015-12-18 DIAGNOSIS — J189 Pneumonia, unspecified organism: Secondary | ICD-10-CM

## 2015-12-18 DIAGNOSIS — E875 Hyperkalemia: Secondary | ICD-10-CM

## 2015-12-18 DIAGNOSIS — R05 Cough: Secondary | ICD-10-CM | POA: Insufficient documentation

## 2015-12-18 DIAGNOSIS — J984 Other disorders of lung: Secondary | ICD-10-CM

## 2015-12-18 LAB — COMPREHENSIVE METABOLIC PANEL
ALK PHOS: 60 U/L (ref 39–117)
ALT: 7 U/L (ref 0–35)
AST: 15 U/L (ref 0–37)
Albumin: 3.5 g/dL (ref 3.5–5.2)
BILIRUBIN TOTAL: 0.2 mg/dL (ref 0.2–1.2)
BUN: 21 mg/dL (ref 6–23)
CO2: 32 mEq/L (ref 19–32)
CREATININE: 1.56 mg/dL — AB (ref 0.40–1.20)
Calcium: 9.6 mg/dL (ref 8.4–10.5)
Chloride: 94 mEq/L — ABNORMAL LOW (ref 96–112)
GFR: 34.02 mL/min — AB (ref >60.00)
GLUCOSE: 107 mg/dL — AB (ref 70–99)
Potassium: 5.4 mEq/L — ABNORMAL HIGH (ref 3.5–5.1)
Sodium: 133 mEq/L — ABNORMAL LOW (ref 135–145)
TOTAL PROTEIN: 6.8 g/dL (ref 6.0–8.3)

## 2015-12-18 LAB — CBC WITH DIFFERENTIAL/PLATELET
BASOS ABS: 0.2 10*3/uL — AB (ref 0.0–0.1)
Basophils Relative: 1.4 % (ref 0.0–3.0)
EOS ABS: 0.1 10*3/uL (ref 0.0–0.7)
Eosinophils Relative: 0.4 % (ref 0.0–5.0)
HCT: 34.4 % — ABNORMAL LOW (ref 36.0–46.0)
Hemoglobin: 11.1 g/dL — ABNORMAL LOW (ref 12.0–15.0)
Lymphocytes Relative: 7.1 % — ABNORMAL LOW (ref 12.0–46.0)
Lymphs Abs: 1.1 10*3/uL (ref 0.7–4.0)
MCHC: 32.4 g/dL (ref 30.0–36.0)
MCV: 92.6 fl (ref 78.0–100.0)
MONO ABS: 1.2 10*3/uL — AB (ref 0.1–1.0)
MONOS PCT: 7.9 % (ref 3.0–12.0)
NEUTROS ABS: 12.4 10*3/uL — AB (ref 1.4–7.7)
NEUTROS PCT: 83.2 % — AB (ref 43.0–77.0)
PLATELETS: 438 10*3/uL — AB (ref 150.0–400.0)
RBC: 3.72 Mil/uL — AB (ref 3.87–5.11)
RDW: 14.1 % (ref 11.5–15.5)
WBC: 14.9 10*3/uL — ABNORMAL HIGH (ref 4.0–10.5)

## 2015-12-18 MED ORDER — ONDANSETRON 8 MG PO TBDP: 8.0000 mg | ORAL_TABLET | ORAL | 0 refills | Status: DC | PRN

## 2015-12-18 MED ORDER — AZITHROMYCIN 250 MG PO TABS: ORAL_TABLET | ORAL | 0 refills | Status: DC

## 2015-12-18 MED ORDER — CEFUROXIME AXETIL 500 MG PO TABS: 500.0000 mg | ORAL_TABLET | Freq: Two times a day (BID) | ORAL | 0 refills | Status: DC

## 2015-12-18 MED ORDER — HYDROCODONE-ACETAMINOPHEN 7.5-325 MG/15ML PO SOLN: 10.0000 mL | Freq: Four times a day (QID) | ORAL | 0 refills | Status: DC | PRN

## 2015-12-18 NOTE — Telephone Encounter (Signed)
Also, due to rising wbc, I will send ceftin and azithromycin. Spoke with pt re: abx. She is agreeable. She is also agreeable to return to the lab- see below.

## 2015-12-18 NOTE — Telephone Encounter (Signed)
Please contact pt in the AM and arrange follow up bmet when she comes in for her CT so we can recheck her potassium which was elevated. Thanks.

## 2015-12-18 NOTE — Telephone Encounter (Signed)
Reviewed CXR- Stable medial right basilar irregular density. Spoke with pt. Reviewed results she is agreeable to proceed with CT chest.

## 2015-12-18 NOTE — Patient Instructions (Addendum)
Complete lab work prior to leaving.  Please complete chest x ray on the first floor. Call if symptoms worsen or if symptoms do not improve in the next few days.

## 2015-12-18 NOTE — Progress Notes (Signed)
Pre visit review using our clinic review tool, if applicable. No additional management support is needed unless otherwise documented below in the visit note. 

## 2015-12-18 NOTE — Progress Notes (Signed)
Subjective:    Patient ID: Marissa Cruz, female    DOB: 1934-07-16, 81 y.o.   MRN: [id number]  HPI  Marissa Cruz is a 81 yr old female who presents today for ER follow up.  ER record is reviewed. She was seen in the ED on 12/07/15. CXR showed opacity medial right lung base which was felt most likely to represent pneumonia.  She was discharged home with levaquin and lortabs.  She reports that she has had some nausea.  She denies shortness of breath.  She reports that she has some chest pain which has been present since she was in the ED and is unchanged.  This pain radiates into her back.  She continues to cough.    Incidental finding of mild anemia (Hbg 10.9), hyponatremia (NA+ 131) and elevated creatinine 1.26.  Chart review shows that she is chronically anemia.  She also has chronic renal insufficiency with a baseline creatinine of 1.28-1.59.  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 She is s/p wide excision of floor of mouth and distal Wharton's duct w/ Warthin's duct sialodochoplasty on 04/05/2014   Wt Readings from Last 3 Encounters:  12/18/15 99 lb 6.4 oz (45.1 kg)  12/07/15 95 lb (43.1 kg)  11/21/15 100 lb 9.6 oz (45.6 kg)     Review of Systems See HPI  Past Medical History:  Diagnosis Date  . Age-related macular degeneration, dry, right eye   . Age-related macular degeneration, wet, left eye (Marissa Cruz)   . Anemia   . Anxiety   . Arthritis    "thumbs, hands" (08/18/2014)  . CAP (community acquired pneumonia) 08/19/2014  . Carotid artery disease ([place])   . Chicken pox   . Chronic lower back pain   . Colon polyp   . Depression   . Diverticulitis   . GERD (gastroesophageal reflux disease)   . History of blood transfusion 1976; 2013   "w/hysterectomy; hip OR"  . History of gout   . Hypertension   . Kidney disease   . 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 TIA (transient ischemic attack) ~ 2009 X 2  . Vocal cord cancer  ([place]) ~ 2009   S/P radiation     Social History   Social History  . Marital status: Widowed    Spouse name: N/A  . Number of children: 1  . Years of education: N/A   Occupational History  . Retired    Social History Main Topics  . Smoking status: Current Every Day Smoker    Packs/day: 0.25    Years: 60.00    Types: Cigarettes  . Smokeless tobacco: Never Used  . Alcohol use 8.4 oz/week    14 Shots of liquor per week     Comment: 08/19/2014 "2 shots brandy q night"  . Drug use: No  . Sexual activity: Not Currently   Other Topics Concern  . Not on file   Social History Narrative  . No narrative on file    Past Surgical History:  Procedure Laterality Date  . ABDOMINAL HYSTERECTOMY  1976  . APPENDECTOMY  1954  . CAROTID ENDARTERECTOMY Left ~ 2014  . CATARACT EXTRACTION W/ INTRAOCULAR LENS  IMPLANT, BILATERAL Bilateral ~ 2009  . COLONOSCOPY N/A 01/31/2014   Procedure: COLONOSCOPY;  Surgeon: Ladene Artist, MD;  Location: [facility];  Service: Endoscopy;  Laterality: N/A;  . DILATION AND CURETTAGE OF UTERUS    .  JOINT REPLACEMENT    . MOUTH FLOOR MASS EXCISION  03/2014   "removed cancer underneath my tongue"  . TONSILLECTOMY  1941  . TOTAL HIP ARTHROPLASTY Left 2013    Family History  Problem Relation Age of Onset  . Hypertension Father     Allergies  Allergen Reactions  . Morphine And Related Other (See Comments)    Hallucinations and combative  . Codeine Nausea Only    Current Outpatient Prescriptions on File Prior to Visit  Medication Sig Dispense Refill  . acetaminophen (TYLENOL) 650 MG CR tablet Take 1,300 mg by mouth at bedtime.    Marland Kitchen albuterol (PROVENTIL HFA;VENTOLIN HFA) 108 (90 BASE) MCG/ACT inhaler Inhale 2 puffs into the lungs every 6 (six) hours as needed for wheezing or shortness of breath. 1 Inhaler 2  . allopurinol (ZYLOPRIM) 100 MG tablet TAKE 1 TABLET BY MOUTH AT BEDTIME 90 tablet 1  . ALPRAZolam (XANAX) 0.25 MG tablet Take 1 tablet (0.25  mg total) by mouth as needed for anxiety. 30 tablet 1  . amLODipine (NORVASC) 5 MG tablet TAKE 1 TABLET BY MOUTH DAILY FOR HIGH BLOOD PRESSURE 90 tablet 0  . aspirin 81 MG tablet Take 81 mg by mouth daily.    . Cholecalciferol 4000 UNITS CAPS Take 1 capsule by mouth every morning. Vitamin D 3    . diphenhydrAMINE (SIMPLY SLEEP) 25 MG tablet Take 25 mg by mouth at bedtime as needed for sleep.    Marland Kitchen docusate sodium (COLACE) 100 MG capsule Take 100 mg by mouth every morning.    . ferrous sulfate 325 (65 FE) MG tablet Take 325 mg by mouth 2 (two) times daily with a meal.     . HYDROcodone-acetaminophen (HYCET) 7.5-325 mg/15 ml solution Take 10 mLs by mouth every 6 (six) hours as needed for moderate pain (pain or cough). 120 mL 0  . LORazepam (ATIVAN) 0.5 MG tablet Take 0.5 mg by mouth as needed for anxiety. Reported on 05/17/2015    . metoprolol succinate (TOPROL-XL) 25 MG 24 hr tablet TAKE 1 TABLET(25 MG) BY MOUTH EVERY MORNING 90 tablet 0  . metoprolol succinate (TOPROL-XL) 25 MG 24 hr tablet TAKE 1 TABLET(25 MG) BY MOUTH EVERY MORNING 90 tablet 0  . mirtazapine (REMERON SOL-TAB) 15 MG disintegrating tablet DISSOLVE 1 TABLET ON THE TONGUE AT BEDTIME 90 tablet 0  . Multiple Vitamins-Minerals (HM MULTIVITAMIN ADULT GUMMY) CHEW Chew 2 tablets by mouth every morning.    . mupirocin ointment (BACTROBAN) 2 % Place 1 application into the nose 2 (two) times daily. 22 g 0  . omeprazole (PRILOSEC) 20 MG capsule TAKE ONE CAPSULE BY MOUTH TWICE DAILY BEFORE A MEAL 60 capsule 11  . ondansetron (ZOFRAN-ODT) 8 MG disintegrating tablet Take 8 mg by mouth as needed for nausea or vomiting. Reported on 05/17/2015    . PARoxetine (PAXIL) 10 MG tablet TAKE 1 TABLET BY MOUTH DAILY FOR DEPRESSION 90 tablet 0  . [name] 0.625 MG tablet TAKE 1 TABLET BY MOUTH EVERY DAY FOR 21 DAYS, THEN DO NOT TAKE FOR 7 DAYS 90 tablet 0  . traMADol (ULTRAM) 50 MG tablet Take 1 tablet (50 mg total) by mouth every 8 (eight) hours as needed. 30  tablet 0   No current facility-administered medications on file prior to visit.     BP (!) 112/56 (BP Location: Right Arm, Patient Position: Sitting, Cuff Size: Small)   Pulse 92   Temp 98.1 F (36.7 C) (Oral)   Resp 18  Ht '5\' 7"'$  (1.702 m)   Wt 99 lb 6.4 oz (45.1 kg)   SpO2 94%   BMI 15.57 kg/m       Objective:   Physical Exam  Constitutional: She is oriented to person, place, and time. She appears well-developed and well-nourished.  Frail, elderly female, NAD.   HENT:  Head: Normocephalic and atraumatic.  Eyes: No scleral icterus.  Cardiovascular: Normal rate, regular rhythm and normal heart sounds.   No murmur heard. Pulmonary/Chest: Effort normal and breath sounds normal. No respiratory distress. She has no wheezes.  Musculoskeletal: She exhibits no edema.  Bilateral LE muscle wasting is noted.   Neurological: She is alert and oriented to person, place, and time.  Psychiatric: She has a normal mood and affect. Her behavior is normal. Judgment and thought content normal.          Assessment & Plan:  Community Acquired Pneumonia- Slow clinical improvement. Family requests refill on hycet solution (they are only giving her 1/2 dose at a time) and zofran. Will obtain follow up lab work and follow up CXR.  Further treatment recommendations following review of CXR results.

## 2015-12-18 NOTE — Addendum Note (Signed)
Addended by: Debbrah Alar on: 12/18/2015 07:03 PM   Modules accepted: Orders

## 2015-12-18 NOTE — Addendum Note (Signed)
Addended by: Debbrah Alar on: 12/18/2015 07:08 PM   Modules accepted: Orders

## 2015-12-19 NOTE — Telephone Encounter (Signed)
Spoke with pt's daughter and scheduled lab appt for 12/21/15 at 10:45am. Future lab order entered.

## 2015-12-19 NOTE — Addendum Note (Signed)
Addended by: Kelle Darting A on: 12/19/2015 05:32 PM   Modules accepted: Orders

## 2015-12-20 ENCOUNTER — Telehealth: Payer: Self-pay | Admitting: Family

## 2015-12-20 NOTE — Telephone Encounter (Signed)
Please arrange a 1 week follow up appointment for this patient with Dr. Etter Sjogren.

## 2015-12-21 ENCOUNTER — Other Ambulatory Visit: Payer: Self-pay | Admitting: Family Medicine

## 2015-12-21 ENCOUNTER — Other Ambulatory Visit (INDEPENDENT_AMBULATORY_CARE_PROVIDER_SITE_OTHER): Payer: Medicare Other

## 2015-12-21 ENCOUNTER — Ambulatory Visit (HOSPITAL_BASED_OUTPATIENT_CLINIC_OR_DEPARTMENT_OTHER)
Admission: RE | Admit: 2015-12-21 | Discharge: 2015-12-21 | Disposition: A | Discharge status: Home / Self Care | Payer: Medicare Other | Source: Ambulatory Visit | Attending: Family | Admitting: Family

## 2015-12-21 DIAGNOSIS — J984 Other disorders of lung: Secondary | ICD-10-CM | POA: Insufficient documentation

## 2015-12-21 DIAGNOSIS — E875 Hyperkalemia: Secondary | ICD-10-CM | POA: Diagnosis not present

## 2015-12-21 DIAGNOSIS — K8020 Calculus of gallbladder without cholecystitis without obstruction: Secondary | ICD-10-CM | POA: Diagnosis not present

## 2015-12-21 DIAGNOSIS — R590 Localized enlarged lymph nodes: Secondary | ICD-10-CM | POA: Diagnosis not present

## 2015-12-21 DIAGNOSIS — J90 Pleural effusion, not elsewhere classified: Secondary | ICD-10-CM | POA: Insufficient documentation

## 2015-12-21 LAB — BASIC METABOLIC PANEL
BUN: 23 mg/dL (ref 6–23)
CHLORIDE: 91 meq/L — AB (ref 96–112)
CO2: 31 meq/L (ref 19–32)
CREATININE: 1.31 mg/dL — AB (ref 0.40–1.20)
Calcium: 9.4 mg/dL (ref 8.4–10.5)
GFR: 41.62 mL/min — ABNORMAL LOW (ref >60.00)
GLUCOSE: 86 mg/dL (ref 70–99)
POTASSIUM: 3.8 meq/L (ref 3.5–5.1)
Sodium: 130 mEq/L — ABNORMAL LOW (ref 135–145)

## 2015-12-22 ENCOUNTER — Telehealth: Payer: Self-pay | Admitting: Family

## 2015-12-22 DIAGNOSIS — R918 Other nonspecific abnormal finding of lung field: Secondary | ICD-10-CM

## 2015-12-22 NOTE — Telephone Encounter (Signed)
Sure Sharyn Lull, can you pl work this pt in on 10-45 am on Monday or add on to Thursday [place] office pm

## 2015-12-22 NOTE — Telephone Encounter (Signed)
Reviewed CT results:  "Lobular mass corresponds to the opacity noted on recent chest x-ray within the posterior right lower lobe worrisome for primary lung carcinoma."  Note is made of hyponatremia as well which could be related to SIADH in setting of lung CA.    I phoned patient and discussed findings with her as well as plans for referral to pulmonology.  Questions answered.  Pt is agreeable to proceed with referral to pulmonology.  Dr. Elsworth Soho- Do you think you could work her in next week please in [place]?  Thanks!

## 2015-12-22 NOTE — Telephone Encounter (Signed)
Spoke with the pt and scheduled ov with RA for [place] 10/26 at 1:30 pm

## 2015-12-25 NOTE — Telephone Encounter (Signed)
Pt has been scheduled.  °

## 2015-12-26 ENCOUNTER — Ambulatory Visit: Payer: Medicare Other | Admitting: Family Medicine

## 2015-12-28 ENCOUNTER — Encounter: Payer: Self-pay | Admitting: Pulmonary Disease

## 2015-12-28 ENCOUNTER — Other Ambulatory Visit: Payer: Self-pay | Admitting: Family

## 2015-12-28 ENCOUNTER — Ambulatory Visit (INDEPENDENT_AMBULATORY_CARE_PROVIDER_SITE_OTHER): Payer: Medicare Other | Admitting: Pulmonary Disease

## 2015-12-28 VITALS — BP 128/70 | HR 97 | Ht 67.0 in | Wt 98.0 lb

## 2015-12-28 DIAGNOSIS — R918 Other nonspecific abnormal finding of lung field: Secondary | ICD-10-CM | POA: Insufficient documentation

## 2015-12-28 MED ORDER — ONDANSETRON 8 MG PO TBDP: 8.0000 mg | ORAL_TABLET | ORAL | 0 refills | Status: DC | PRN

## 2015-12-28 NOTE — Patient Instructions (Signed)
proceed with PET scan in 2 weeks  Based on this we will decide about a possible biopsy  We discussed taking pain pills at night with a nausea pill Okay to take extra strength Tylenol twice daily

## 2015-12-28 NOTE — Assessment & Plan Note (Signed)
On imaging review, this does appear to be a lung mass rather than consolidation, there are no air bronchograms within this mass. It is difficult to correlate this with the chest x-ray findings it is possible that she did have pneumonia based on her symptoms at onset. Her chest pain is difficult to correlate because that does not seem to be any lesion in the left lung or in her bones on the CAT scan. I think the best approach here would be to proceed with a PET scan-I would defer this for at least 2 weeks to pick up any kind of resolution of infectious/inflammatory findings related to pneumonia. We discussed approach should the PET scan showed hypermetabolism-she would likely need a CT-guided lung biopsy Alternative procedure would be endo bronchial ultrasound but given her overall physical health, I think she would be a very poor candidate for general anesthesia

## 2015-12-28 NOTE — Progress Notes (Signed)
Subjective:    Patient ID: Marissa Cruz, female    DOB: 1934-04-10, 81 y.o.   MRN: [id number]  HPI   Chief Complaint  Patient presents with  . Advice Only    Review CT per RA.  pt states she is here for recurrent pneumonia    81 year old smoker presents for evaluation of abnormal imaging studies. She is wheelchair-bound for many years due to her hip issues and is able to transfer by herself she has a history of vocal cord cancer that was treated with radiation in 2009. 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 She smoked about a pack per day for 60 years about 60 pack years and has recently cut down to about half pack per day  She presented to the emergency room on 12/07/15 after a head cold with cough subjective fevers and shortness of breath, chest x-ray showed an infiltrate in the right medial and lower lobe, labs were notable for a hemoglobin of 10.9 , WBC of 10.3 with 81% neutrophils and a sodium of 131. She followed up with her PCP in 10/16 and repeat chest x-ray showed persistent infiltrate and she underwent CT chest without contrast on 10/19-images were reviewed by me with the patient- this showed a lobular mass in the right posterior lower lobe abutting the pleura measuring 2.82.2 cm with a mild right pleural effusion. Mediastinal lymphadenopathy was noted in the precarinal and subcarinal region. There was a 10 mm soft tissue lesion adjacent to the descending thoracic aorta.  She was given Levaquin in the emergency room and then Ceftin by her PCP Her cough has subsided and breathing is improved. She continues to complain of substernal chest pain radiating to her left and her back. She is unable to take pain medication such as tramadol or narcotics due to persistent nausea. This seems to be her main symptom  She is also lost about 15 pounds in the last 3 months.      Past Medical History:  Diagnosis Date  . Age-related macular  degeneration, dry, right eye   . Age-related macular degeneration, wet, left eye ([place])   . Anemia   . Anxiety   . Arthritis    "thumbs, hands" (08/18/2014)  . CAP (community acquired pneumonia) 08/19/2014  . Carotid artery disease ([place])   . Chicken pox   . Chronic lower back pain   . Colon polyp   . Depression   . Diverticulitis   . GERD (gastroesophageal reflux disease)   . History of blood transfusion 1976; 2013   "w/hysterectomy; hip OR"  . History of gout   . Hypertension   . Kidney disease   . 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 TIA (transient ischemic attack) ~ 2009 X 2  . Vocal cord cancer ([place]) ~ 2009   S/P radiation     Past Surgical History:  Procedure Laterality Date  . ABDOMINAL HYSTERECTOMY  1976  . APPENDECTOMY  1954  . CAROTID ENDARTERECTOMY Left ~ 2014  . CATARACT EXTRACTION W/ INTRAOCULAR LENS  IMPLANT, BILATERAL Bilateral ~ 2009  . COLONOSCOPY N/A 01/31/2014   Procedure: COLONOSCOPY;  Surgeon: Ladene Artist, MD;  Location: [facility];  Service: Endoscopy;  Laterality: N/A;  . DILATION AND CURETTAGE OF UTERUS    . JOINT REPLACEMENT    . MOUTH FLOOR MASS EXCISION  03/2014   "removed cancer underneath my tongue"  . TONSILLECTOMY  1941  .  TOTAL HIP ARTHROPLASTY Left 2013     Allergies  Allergen Reactions  . Morphine And Related Other (See Comments)    Hallucinations and combative  . Codeine Nausea Only     Social History   Social History  . Marital status: Widowed    Spouse name: N/A  . Number of children: 1  . Years of education: N/A   Occupational History  . Retired    Social History Main Topics  . Smoking status: Current Every Day Smoker    Packs/day: 1.00    Years: 60.00    Types: Cigarettes  . Smokeless tobacco: Never Used     Comment: down to 6 cigarettes daily  . Alcohol use 8.4 oz/week    14 Shots of liquor per week     Comment: 08/19/2014 "2 shots brandy q night"  . Drug use: No  . Sexual  activity: Not Currently   Other Topics Concern  . Not on file   Social History Narrative  . No narrative on file     Family History  Problem Relation Age of Onset  . Hypertension Father   . Emphysema Paternal Grandfather      Review of Systems   neg for any significant sore throat, dysphagia, itching, sneezing, nasal congestion or excess/ purulent secretions, fever, chills, sweats, unintended wt loss, pleuritic or exertional cp, hempoptysis, orthopnea pnd or change in chronic leg swelling.   Also denies presyncope, palpitations, heartburn, abdominal pain, nausea, vomiting, diarrhea or change in bowel or urinary habits, dysuria,hematuria, rash, arthralgias, visual complaints, headache, numbness weakness or ataxia.     Objective:   Physical Exam   Gen. Pleasant, thin, in no distress, normal affect, wheelchair ENT - no lesions, no post nasal drip Neck: No JVD, no thyromegaly, no carotid bruits Lungs: no use of accessory muscles, no dullness to percussion, decreased without rales or rhonchi  Cardiovascular: Rhythm regular, heart sounds  normal, no murmurs or gallops, no peripheral edema Abdomen: soft and non-tender, no hepatosplenomegaly, BS normal. Musculoskeletal: No deformities, no cyanosis or clubbing Neuro:  alert, non focal        Assessment & Plan:

## 2015-12-28 NOTE — Progress Notes (Signed)
   Subjective:    Patient ID: Marissa Cruz, female    DOB: [birth year], 81 y.o.   MRN: [id number]  HPI    Review of Systems  Constitutional: Negative for fever and unexpected weight change.  HENT: Positive for congestion and postnasal drip. Negative for dental problem, ear pain, nosebleeds, rhinorrhea, sinus pressure, sneezing, sore throat and trouble swallowing.   Eyes: Negative for redness and itching.  Respiratory: Positive for cough. Negative for chest tightness, shortness of breath and wheezing.   Cardiovascular: Negative for palpitations and leg swelling.  Gastrointestinal: Negative for nausea and vomiting.  Genitourinary: Negative for dysuria.  Musculoskeletal: Negative for joint swelling.  Skin: Negative for rash.  Neurological: Negative for headaches.  Hematological: Does not bruise/bleed easily.  Psychiatric/Behavioral: Negative for dysphoric mood. The patient is not nervous/anxious.        Objective:   Physical Exam        Assessment & Plan:

## 2015-12-29 ENCOUNTER — Other Ambulatory Visit: Payer: Self-pay

## 2015-12-29 MED ORDER — ONDANSETRON 8 MG PO TBDP: 8.0000 mg | ORAL_TABLET | Freq: Three times a day (TID) | ORAL | 0 refills | Status: DC | PRN

## 2016-01-09 ENCOUNTER — Ambulatory Visit (HOSPITAL_COMMUNITY)
Admission: RE | Admit: 2016-01-09 | Discharge: 2016-01-09 | Disposition: A | Discharge status: Home / Self Care | Payer: Medicare Other | Source: Ambulatory Visit | Attending: Pulmonary Disease | Admitting: Pulmonary Disease

## 2016-01-09 DIAGNOSIS — C781 Secondary malignant neoplasm of mediastinum: Secondary | ICD-10-CM | POA: Diagnosis not present

## 2016-01-09 DIAGNOSIS — R918 Other nonspecific abnormal finding of lung field: Secondary | ICD-10-CM | POA: Diagnosis not present

## 2016-01-09 LAB — GLUCOSE, CAPILLARY: GLUCOSE-CAPILLARY: 103 mg/dL — AB (ref 65–99)

## 2016-01-09 MED ORDER — FLUDEOXYGLUCOSE F - 18 (FDG) INJECTION
5.0000 | Freq: Once | INTRAVENOUS | Status: AC | PRN
  Administered 2016-01-09: 5 via INTRAVENOUS

## 2016-01-11 ENCOUNTER — Other Ambulatory Visit: Payer: Self-pay | Admitting: Family Medicine

## 2016-01-14 ENCOUNTER — Other Ambulatory Visit: Payer: Self-pay | Admitting: Family Medicine

## 2016-01-15 MED ORDER — ONDANSETRON 8 MG PO TBDP: 8.0000 mg | ORAL_TABLET | Freq: Three times a day (TID) | ORAL | 0 refills | Status: DC | PRN

## 2016-01-15 NOTE — Telephone Encounter (Signed)
Pt is requesting refill on Zofran.   Last OV: 11/21/2015 Last Fill: 12/29/2015 #60 and 0RF Pt sig: 1 tab q8h prn   Please advise.

## 2016-01-18 ENCOUNTER — Ambulatory Visit (INDEPENDENT_AMBULATORY_CARE_PROVIDER_SITE_OTHER): Payer: Medicare Other | Admitting: Pulmonary Disease

## 2016-01-18 ENCOUNTER — Encounter: Payer: Self-pay | Admitting: Pulmonary Disease

## 2016-01-18 VITALS — BP 116/64 | HR 107

## 2016-01-18 DIAGNOSIS — R918 Other nonspecific abnormal finding of lung field: Secondary | ICD-10-CM | POA: Diagnosis not present

## 2016-01-18 NOTE — Progress Notes (Signed)
   Subjective:    Patient ID: Marissa Cruz, female    DOB: 07-Sep-1934, 81 y.o.   MRN: [id number]  HPI  81 year old smoker for FU of lung mass She is wheelchair-bound for many years due to her hip issues and is able to transfer by herself .she has a history of vocal cord cancer that was treated with radiation in 2009. 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 She smoked about a pack per day for 60 years about 60 pack years and has recently cut down to about half pack per day  She presented to the emergency room on 12/07/15 after a head cold with cough subjective fevers and shortness of breath, chest x-ray showed an infiltrate in the right medial and lower lobe, labs were notable for a hemoglobin of 10.9 , WBC of 10.3 with 81% neutrophils and a sodium of 131.  01/18/2016  Chief Complaint  Patient presents with  . Follow-up    review PET.  pt states breathing is unchanged since last OV.      She continues to have left-sided chest pain, nonpleuritic, nonradiating She also has nausea, no emesis She's lost 15 pounds in the last 2 months Her breathing is otherwise unchanged since last visit  We discussed PET scan results in detail  Significant tests/ events   CT chest without contrast on 10/19-images were reviewed by me with the patient- this showed a lobular mass in the right posterior lower lobe abutting the pleura measuring 2.82.2 cm with a mild right pleural effusion. Mediastinal lymphadenopathy was noted in the precarinal and subcarinal region. There was a 10 mm soft tissue lesion adjacent to the descending thoracic aorta.  PET 01/2016 >> Markedly hypermetabolic posterior right lower lobe pulmonary lesion, consistent with malignancy.  Hypermetabolic metastases in the mediastinum and hilar regions.    Review of Systems neg for any significant sore throat, dysphagia, itching, sneezing, nasal congestion or excess/ purulent secretions,  fever, chills, sweats, unintended wt loss, pleuritic or exertional cp, hempoptysis, orthopnea pnd or change in chronic leg swelling. Also denies presyncope, palpitations, heartburn, abdominal pain, nausea, vomiting, diarrhea or change in bowel or urinary habits, dysuria,hematuria, rash, arthralgias, visual complaints, headache, numbness weakness or ataxia.     Objective:   Physical Exam  Gen. Pleasant, thin, in no distress ENT - no lesions, no post nasal drip Neck: No JVD, no thyromegaly, no carotid bruits Lungs: no use of accessory muscles, no dullness to percussion, clear without rales or rhonchi  Cardiovascular: Rhythm regular, heart sounds  normal, no murmurs or gallops, no peripheral edema Musculoskeletal: No deformities, no cyanosis or clubbing        Assessment & Plan:

## 2016-01-18 NOTE — Assessment & Plan Note (Signed)
With the appearance on CT and PET scan, lack of air bronchograms etc. this is more likely to be malignancy then consolidation  We discussed weight and watch approach and also proceeding with a biopsy. She is amenable to biopsy-I do not think she is a good candidate for general anesthesia, hence we will proceed with CT-guided needle biopsy of lung mass I discussed risks and benefits today I explained that this is likely stage III and she is not a candidate for radiation or chemotherapy-we will send it for markers should this turn out to be adenocarcinoma  We discussed the small possibility that this could still be a pneumonia, non-resolving

## 2016-01-18 NOTE — Patient Instructions (Signed)
We will schedule CT-guided biopsy of right lower lobe lung mass  Take Aleve once daily for pain after food x 2 weeks Then you can take extra strength Tylenol

## 2016-01-18 NOTE — Assessment & Plan Note (Signed)
Her left-sided chest and back pain remains unexplained by her imaging I have asked her to take NSAIDs and extra strength Tylenol

## 2016-01-23 DIAGNOSIS — Z961 Presence of intraocular lens: Secondary | ICD-10-CM | POA: Diagnosis not present

## 2016-01-23 DIAGNOSIS — Z0100 Encounter for examination of eyes and vision without abnormal findings: Secondary | ICD-10-CM | POA: Diagnosis not present

## 2016-01-29 ENCOUNTER — Encounter: Payer: Self-pay | Admitting: Adult Health

## 2016-01-29 ENCOUNTER — Other Ambulatory Visit: Payer: Self-pay | Admitting: Radiology

## 2016-01-31 ENCOUNTER — Other Ambulatory Visit: Payer: Self-pay | Admitting: Student

## 2016-02-01 ENCOUNTER — Ambulatory Visit (HOSPITAL_COMMUNITY)
Admission: RE | Admit: 2016-02-01 | Discharge: 2016-02-01 | Disposition: A | Discharge status: Home / Self Care | Payer: Medicare Other | Source: Ambulatory Visit | Attending: Interventional Radiology | Admitting: Interventional Radiology

## 2016-02-01 ENCOUNTER — Ambulatory Visit (HOSPITAL_COMMUNITY)
Admission: RE | Admit: 2016-02-01 | Discharge: 2016-02-01 | Disposition: A | Discharge status: Home / Self Care | Payer: Medicare Other | Source: Ambulatory Visit | Attending: Pulmonary Disease | Admitting: Pulmonary Disease

## 2016-02-01 DIAGNOSIS — R918 Other nonspecific abnormal finding of lung field: Secondary | ICD-10-CM | POA: Insufficient documentation

## 2016-02-01 DIAGNOSIS — R911 Solitary pulmonary nodule: Secondary | ICD-10-CM | POA: Diagnosis not present

## 2016-02-01 DIAGNOSIS — J95811 Postprocedural pneumothorax: Secondary | ICD-10-CM | POA: Diagnosis not present

## 2016-02-01 DIAGNOSIS — C3431 Malignant neoplasm of lower lobe, right bronchus or lung: Secondary | ICD-10-CM | POA: Diagnosis not present

## 2016-02-01 LAB — CBC WITH DIFFERENTIAL/PLATELET
BASOS ABS: 0.1 10*3/uL (ref 0.0–0.1)
Basophils Relative: 1 %
Eosinophils Absolute: 0.3 10*3/uL (ref 0.0–0.7)
Eosinophils Relative: 3 %
HCT: 35.5 % — ABNORMAL LOW (ref 36.0–46.0)
HEMOGLOBIN: 11.5 g/dL — AB (ref 12.0–15.0)
LYMPHS ABS: 1.9 10*3/uL (ref 0.7–4.0)
LYMPHS PCT: 18 %
MCH: 29.8 pg (ref 26.0–34.0)
MCHC: 32.4 g/dL (ref 30.0–36.0)
MCV: 92 fL (ref 78.0–100.0)
Monocytes Absolute: 1 10*3/uL (ref 0.1–1.0)
Monocytes Relative: 9 %
NEUTROS PCT: 69 %
Neutro Abs: 7 10*3/uL (ref 1.7–7.7)
PLATELETS: 345 10*3/uL (ref 150–400)
RBC: 3.86 MIL/uL — AB (ref 3.87–5.11)
RDW: 16.8 % — ABNORMAL HIGH (ref 11.5–15.5)
WBC: 10.2 10*3/uL (ref 4.0–10.5)

## 2016-02-01 LAB — BASIC METABOLIC PANEL
ANION GAP: 10 (ref 5–15)
BUN: 15 mg/dL (ref 6–20)
CHLORIDE: 95 mmol/L — AB (ref 101–111)
CO2: 28 mmol/L (ref 22–32)
Calcium: 9.1 mg/dL (ref 8.9–10.3)
Creatinine, Ser: 1.35 mg/dL — ABNORMAL HIGH (ref 0.44–1.00)
GFR, EST AFRICAN AMERICAN: 42 mL/min — AB (ref >60)
GFR, EST NON AFRICAN AMERICAN: 36 mL/min — AB (ref >60)
Glucose, Bld: 96 mg/dL (ref 65–99)
POTASSIUM: 3.5 mmol/L (ref 3.5–5.1)
SODIUM: 133 mmol/L — AB (ref 135–145)

## 2016-02-01 LAB — PROTIME-INR
INR: 0.87
PROTHROMBIN TIME: 11.8 s (ref 11.4–15.2)

## 2016-02-01 MED ORDER — FENTANYL CITRATE (PF) 100 MCG/2ML IJ SOLN
INTRAMUSCULAR | Status: AC
  Filled 2016-02-01: qty 2

## 2016-02-01 MED ORDER — MIDAZOLAM HCL 2 MG/2ML IJ SOLN
INTRAMUSCULAR | Status: AC | PRN
  Administered 2016-02-01 (×2): 1 mg via INTRAVENOUS

## 2016-02-01 MED ORDER — LIDOCAINE HCL 1 % IJ SOLN
INTRAMUSCULAR | Status: AC
  Filled 2016-02-01: qty 20

## 2016-02-01 MED ORDER — MIDAZOLAM HCL 2 MG/2ML IJ SOLN
INTRAMUSCULAR | Status: AC
  Filled 2016-02-01: qty 2

## 2016-02-01 MED ORDER — SODIUM CHLORIDE 0.9 % IV SOLN: INTRAVENOUS | Status: DC

## 2016-02-01 MED ORDER — FENTANYL CITRATE (PF) 100 MCG/2ML IJ SOLN
INTRAMUSCULAR | Status: AC | PRN
  Administered 2016-02-01 (×2): 25 ug via INTRAVENOUS

## 2016-02-01 NOTE — Discharge Instructions (Signed)
Needle Biopsy of Lung, Care After Refer to this sheet in the next few weeks. These instructions provide you with information on caring for yourself after your procedure. Your health care provider may also give you more specific instructions. Your treatment has been planned according to current medical practices, but problems sometimes occur. Call your health care provider if you have any problems or questions after your procedure. WHAT TO EXPECT AFTER THE PROCEDURE  A bandage will be applied over the area where the needle was inserted. You may be asked to apply pressure to the bandage for several minutes to ensure there is minimal bleeding.  In most cases, you can leave when your needle biopsy procedure is completed. Do not drive yourself home. Someone else should take you home.  If you received an IV sedative or general anesthetic, you will be taken to a comfortable place to relax while the medicine wears off.  If you have upcoming travel scheduled, talk to your health care provider about when it is safe to travel by air after the procedure. HOME CARE INSTRUCTIONS  Expect to take it easy for the rest of the day.  Protect the area where you received the needle biopsy by keeping the bandage in place for as long as instructed.  You may feel some mild pain or discomfort in the area, but this should stop in a day or two.  Take medicines only as directed by your health care provider. SEEK MEDICAL CARE IF:   You have pain at the biopsy site that worsens or is not helped by medicine.  You have swelling or drainage at the needle biopsy site.  You have a fever. SEEK IMMEDIATE MEDICAL CARE IF:   You have new or worsening shortness of breath.  You have chest pain.  You are coughing up blood.  You have bleeding that does not stop with pressure or a bandage.  You develop light-headedness or fainting. This information is not intended to replace advice given to you by your health care  provider. Make sure you discuss any questions you have with your health care provider. Document Released: 12/16/2006 Document Revised: 03/11/2014 Document Reviewed: 07/13/2012 Elsevier Interactive Patient Education  2017 [employer].

## 2016-02-01 NOTE — Sedation Documentation (Signed)
Extra fluid. Arousing pt.

## 2016-02-01 NOTE — Sedation Documentation (Signed)
Patient is resting comfortably. 

## 2016-02-01 NOTE — Sedation Documentation (Signed)
Patient denies pain and is resting comfortably.  

## 2016-02-01 NOTE — Procedures (Signed)
Interventional Radiology Procedure Note  Procedure: CT guided biopsy of RLL lung mass. [employer] deployed Complications: None Recommendations: - Bedrest until CXR cleared.  Minimize talking, coughing or otherwise straining.  - Follow up 1 hr CXR pending    Signed,  Lamoyne Palencia S. Earleen Newport, DO

## 2016-02-01 NOTE — Consult Note (Signed)
Chief Complaint: Patient was seen in consultation today for right lower lobe lung mass biopsy at the request of Alva,Rakesh V  Referring Physician(s): Rigoberto Noel  Supervising Physician: Corrie Mckusick  Patient Status: [facility] - Out-pt  History of Present Illness: Marissa Cruz is a 81 y.o. female with history of vocal cord cancer and mouth cancer who was found to have a lung mass after a recent ED visit for cough and shortness of breath. She is a current smoker.  PET scan completed 01/09/16 showed hypermetabolic posterior right lower lobe pulmonary lesion consistent with malignancy.  She was referred to IR by Dr. Elsworth Soho for possible right lung biopsy.    She not on any blood thinners. She has been NPO this morning.    Past Medical History:  Diagnosis Date  . Age-related macular degeneration, dry, right eye   . Age-related macular degeneration, wet, left eye ([place])   . Anemia   . Anxiety   . Arthritis    "thumbs, hands" (08/18/2014)  . CAP (community acquired pneumonia) 08/19/2014  . Carotid artery disease ([place])   . Chicken pox   . Chronic lower back pain   . Colon polyp   . Depression   . Diverticulitis   . GERD (gastroesophageal reflux disease)   . History of blood transfusion 1976; 2013   "w/hysterectomy; hip OR"  . History of gout   . Hypertension   . Kidney disease   . 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 TIA (transient ischemic attack) ~ 2009 X 2  . Vocal cord cancer ([place]) ~ 2009   S/P radiation    Past Surgical History:  Procedure Laterality Date  . ABDOMINAL HYSTERECTOMY  1976  . APPENDECTOMY  1954  . CAROTID ENDARTERECTOMY Left ~ 2014  . CATARACT EXTRACTION W/ INTRAOCULAR LENS  IMPLANT, BILATERAL Bilateral ~ 2009  . COLONOSCOPY N/A 01/31/2014   Procedure: COLONOSCOPY;  Surgeon: Ladene Artist, MD;  Location: [facility];  Service: Endoscopy;  Laterality: N/A;  . DILATION AND CURETTAGE OF UTERUS    . JOINT REPLACEMENT      . MOUTH FLOOR MASS EXCISION  03/2014   "removed cancer underneath my tongue"  . TONSILLECTOMY  1941  . TOTAL HIP ARTHROPLASTY Left 2013    Allergies: Morphine and related and Codeine  Medications: Prior to Admission medications   Medication Sig Start Date End Date Taking? Authorizing Provider  acetaminophen (TYLENOL) 650 MG CR tablet Take 1,300 mg by mouth at bedtime.   Yes Historical Provider, MD  allopurinol (ZYLOPRIM) 100 MG tablet TAKE 1 TABLET BY MOUTH AT BEDTIME 01/12/16  Yes Yvonne R Lowne Chase, DO  ALPRAZolam (XANAX) 0.25 MG tablet Take 1 tablet (0.25 mg total) by mouth as needed for anxiety. 11/21/15  Yes Yvonne R Lowne Chase, DO  amLODipine (NORVASC) 5 MG tablet TAKE 1 TABLET BY MOUTH DAILY FOR HIGH BLOOD PRESSURE 12/12/15  Yes Mosie Lukes, MD  aspirin 81 MG tablet Take 81 mg by mouth daily.   Yes Historical Provider, MD  diphenhydrAMINE (SIMPLY SLEEP) 25 MG tablet Take 25 mg by mouth at bedtime as needed for sleep.   Yes Historical Provider, MD  docusate sodium (COLACE) 100 MG capsule Take 100 mg by mouth every morning.   Yes Historical Provider, MD  ferrous sulfate 325 (65 FE) MG tablet Take 325 mg by mouth 2 (two) times daily with a meal.    Yes Historical Provider, MD  HYDROcodone-acetaminophen (HYCET)  7.5-325 mg/15 ml solution Take 10 mLs by mouth every 6 (six) hours as needed for moderate pain (pain or cough). 12/18/15 12/17/16 Yes Debbrah Alar, NP  LORazepam (ATIVAN) 0.5 MG tablet Take 0.5 mg by mouth as needed for anxiety. Reported on 05/17/2015   Yes Historical Provider, MD  metoprolol succinate (TOPROL-XL) 25 MG 24 hr tablet TAKE 1 TABLET(25 MG) BY MOUTH EVERY MORNING 12/12/15  Yes Mosie Lukes, MD  mirtazapine (REMERON SOL-TAB) 15 MG disintegrating tablet DISSOLVE 1 TABLET ON THE TONGUE AT BEDTIME 12/18/15  Yes Yvonne R Lowne Chase, DO  Multiple Vitamins-Minerals (HM MULTIVITAMIN ADULT GUMMY) CHEW Chew 2 tablets by mouth every morning.   Yes Historical  Provider, MD  omeprazole (PRILOSEC) 20 MG capsule TAKE ONE CAPSULE BY MOUTH TWICE DAILY BEFORE A MEAL 08/25/15  Yes Yvonne R Lowne Chase, DO  ondansetron (ZOFRAN-ODT) 8 MG disintegrating tablet Take 1 tablet (8 mg total) by mouth every 8 (eight) hours as needed for nausea or vomiting. Reported on 05/17/2015 01/15/16  Yes Yvonne R Lowne Chase, DO  PARoxetine (PAXIL) 10 MG tablet TAKE 1 TABLET BY MOUTH DAILY FOR DEPRESSION 12/12/15  Yes Mosie Lukes, MD  [name] 0.625 MG tablet TAKE 1 TABLET BY MOUTH EVERY DAY FOR 21 DAYS, THEN DO NOT TAKE FOR 7 DAYS 12/18/15  Yes Alferd Apa Lowne Chase, DO  traMADol (ULTRAM) 50 MG tablet Take 1 tablet (50 mg total) by mouth every 8 (eight) hours as needed. 11/21/15  Yes Yvonne R Lowne Chase, DO  traZODone (DESYREL) 50 MG tablet TAKE 1 TABLET(50 MG) BY MOUTH AT BEDTIME 12/21/15  Yes Ann Held, DO     Family History  Problem Relation Age of Onset  . Hypertension Father   . Emphysema Paternal Grandfather     Social History   Social History  . Marital status: Widowed    Spouse name: N/A  . Number of children: 1  . Years of education: N/A   Occupational History  . Retired    Social History Main Topics  . Smoking status: Current Every Day Smoker    Packs/day: 1.00    Years: 60.00    Types: Cigarettes  . Smokeless tobacco: Never Used     Comment: down to 6 cigarettes daily  . Alcohol use 8.4 oz/week    14 Shots of liquor per week     Comment: 08/19/2014 "2 shots brandy q night"  . Drug use: No  . Sexual activity: Not Currently   Other Topics Concern  . Not on file   Social History Narrative  . No narrative on file    Review of Systems  Constitutional: Negative for fatigue and fever.  Respiratory: Positive for cough and shortness of breath (chronic).   Cardiovascular: Negative for chest pain.  Gastrointestinal: Negative for abdominal pain.  Psychiatric/Behavioral: Negative for behavioral problems and confusion.    Vital Signs: BP  (!) 153/61   Pulse 92   Temp 97.6 F (36.4 C) (Oral)   Resp 16   Ht '5\' 7"'$  (1.702 m)   Wt 95 lb (43.1 kg)   SpO2 93%   BMI 14.88 kg/m   Physical Exam  Constitutional: She is oriented to person, place, and time. She appears well-developed.  HENT:  Head: Normocephalic and atraumatic.  Cardiovascular: Normal rate, regular rhythm and normal heart sounds.   Pulmonary/Chest: Effort normal and breath sounds normal. No respiratory distress.  Neurological: She is alert and oriented to person, place, and time.  Skin:  Skin is warm and dry.  Psychiatric: She has a normal mood and affect. Her behavior is normal. Judgment and thought content normal.  Nursing note and vitals reviewed.   Mallampati Score:  MD Evaluation Airway: WNL Heart: WNL Abdomen: WNL Chest/ Lungs: WNL ASA  Classification: 3 Mallampati/Airway Score: Two  Imaging: Nm Pet Image Initial (pi) Skull Base To Thigh  Result Date: 01/09/2016 CLINICAL DATA:  Initial.  Initial Treatment strategy for lung mass. EXAM: NUCLEAR MEDICINE PET SKULL BASE TO THIGH TECHNIQUE: 5.0 mCi F-18 FDG was injected intravenously. Full-ring PET imaging was performed from the skull base to thigh after the radiotracer. CT data was obtained and used for attenuation correction and anatomic localization. FASTING BLOOD GLUCOSE:  Value: 103 mg/dl COMPARISON:  CT chest 12/21/2015 down FINDINGS: NECK No hypermetabolic lymph nodes in the neck. Bilateral thyroid nodules show no substantial hypermetabolism. CHEST 3.0 x 2.2 cm posterior right lower lobe pulmonary mass is markedly hypermetabolic with SUV max = 33.5. 1.2 cm short axis precarinal lymph node is hypermetabolic with SUV max = 45.6 1.4 cm short axis subcarinal lymph node is hypermetabolic with SUV max = 25.6. Hypermetabolic lymphadenopathy is identified in both hila. 1.2 cm short axis para-aortic lymph node (image 85 series 4) is hypermetabolic with SUV max = 38.9. Centrilobular emphysema noted bilaterally. No  focal airspace consolidation. No pulmonary edema or pleural effusion. ABDOMEN/PELVIS No abnormal hypermetabolic activity within the liver, pancreas, adrenal glands, or spleen. No hypermetabolic lymph nodes in the abdomen or pelvis. Calcified stones noted in the gallbladder. There is abdominal aortic atherosclerosis without aneurysm. SKELETON No focal hypermetabolic activity to suggest skeletal metastasis. IMPRESSION: 1. Markedly hypermetabolic posterior right lower lobe pulmonary lesion, consistent with malignancy. 2. Hypermetabolic metastases in the mediastinum and hilar regions. Electronically Signed   By: Misty Stanley M.D.   On: 01/09/2016 14:08    Labs:  CBC:  Recent Labs  11/21/15 1138 12/07/15 1127 12/18/15 1354 02/01/16 0630  WBC 11.9* 10.3 14.9* 10.2  HGB 11.1* 10.9* 11.1* 11.5*  HCT 34.5* 33.9* 34.4* 35.5*  PLT 488.0* 441* 438.0* 345    COAGS:  Recent Labs  02/01/16 0630  INR 0.87    BMP:  Recent Labs  12/07/15 1127 12/18/15 1354 12/21/15 1020 02/01/16 0630  NA 131* 133* 130* 133*  K 3.6 5.4* 3.8 3.5  CL 96* 94* 91* 95*  CO2 24 32 31 28  GLUCOSE 101* 107* 86 96  BUN '19 21 23 15  '$ CALCIUM 8.6* 9.6 9.4 9.1  CREATININE 1.26* 1.56* 1.31* 1.35*  GFRNONAA 39*  --   --  36*  GFRAA 46*  --   --  42*    LIVER FUNCTION TESTS:  Recent Labs  05/18/15 1143 11/21/15 1138 12/18/15 1354  BILITOT 0.3 0.2 0.2  AST '20 19 15  '$ ALT '12 10 7  '$ ALKPHOS 95 82 60  PROT 7.5 7.0 6.8  ALBUMIN 3.9 3.6 3.5    TUMOR MARKERS: No results for input(s): AFPTM, CEA, CA199, CHROMGRNA in the last 8760 hours.  Assessment and Plan:  Right lower lobe lung mass Patient with history of mouth and vocal cord cancer presents today for lung biopsy of posterior right lower lobe mass found on PET scan.  Patient continues to smoke.  Order was reviewed by Dr. Annamaria Boots who felt patient appropriate for CT-guided posterior right lower lobe biopsy. Risks and benefits discussed with the patient and  her daughter including, but not limited to bleeding, hemoptysis, respiratory failure requiring intubation, infection, pneumothorax  requiring chest tube placement, stroke from air embolism or even death. All of the patient's questions as well as her daughter's questions were answered, patient is agreeable to proceed. Consent signed and in chart.  Thank you for this interesting consult.  I greatly enjoyed meeting Tennie Grussing and look forward to participating in their care.  A copy of this report was sent to the requesting provider on this date.  Electronically Signed: Docia Barrier 02/01/2016, 8:22 AM   I spent a total of 20 minutes in face to face in clinical consultation, greater than 50% of which was counseling/coordinating care for posterior right lower lobe mass.

## 2016-02-05 ENCOUNTER — Other Ambulatory Visit: Payer: Self-pay | Admitting: Adult Health

## 2016-02-05 DIAGNOSIS — C3490 Malignant neoplasm of unspecified part of unspecified bronchus or lung: Secondary | ICD-10-CM

## 2016-02-08 ENCOUNTER — Ambulatory Visit: Payer: Medicare Other

## 2016-02-08 ENCOUNTER — Other Ambulatory Visit: Payer: Medicare Other

## 2016-02-08 ENCOUNTER — Ambulatory Visit (HOSPITAL_BASED_OUTPATIENT_CLINIC_OR_DEPARTMENT_OTHER): Payer: Medicare Other | Admitting: Hematology & Oncology

## 2016-02-08 ENCOUNTER — Encounter: Payer: Self-pay | Admitting: Radiation Oncology

## 2016-02-08 DIAGNOSIS — R630 Anorexia: Secondary | ICD-10-CM | POA: Diagnosis not present

## 2016-02-08 DIAGNOSIS — C3431 Malignant neoplasm of lower lobe, right bronchus or lung: Secondary | ICD-10-CM | POA: Diagnosis not present

## 2016-02-08 DIAGNOSIS — R64 Cachexia: Secondary | ICD-10-CM

## 2016-02-08 DIAGNOSIS — R52 Pain, unspecified: Secondary | ICD-10-CM | POA: Diagnosis not present

## 2016-02-08 DIAGNOSIS — Z8589 Personal history of malignant neoplasm of other organs and systems: Secondary | ICD-10-CM | POA: Diagnosis not present

## 2016-02-08 DIAGNOSIS — Z720 Tobacco use: Secondary | ICD-10-CM

## 2016-02-08 MED ORDER — DRONABINOL 2.5 MG PO CAPS: 2.5000 mg | ORAL_CAPSULE | Freq: Two times a day (BID) | ORAL | 0 refills | Status: DC

## 2016-02-08 MED ORDER — TRAMADOL HCL 50 MG PO TABS: 50.0000 mg | ORAL_TABLET | Freq: Four times a day (QID) | ORAL | 0 refills | Status: DC | PRN

## 2016-02-09 NOTE — Progress Notes (Signed)
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 : I have lung cancer.  HPI: Ms. Hammerstrom is a very charming 81 year old white female. She has a long history of tobacco use. She probably has a 60-pack-year history of tobacco use. She still smoking about half pack per day. She does have some moderate alcohol use.  She began to have some cough and shortness of breath recently. She went to the emergency room. This was in October. She had a chest x-ray done which showed some opacity in the right lung base. She is placed on some antibiotics.  Then followed up with a chest x-ray which showed persistent infiltrates. A CT of the chest was done. This showed a lobular mass in the right lower lobe measuring 2.8 x 2.2 cm. There is mild right pleural effusion. There is some mediastinal adenopathy.  She lost about 15-20 pounds. Appetite is decreased. She has some complaints of back discomfort in the mid thoracic region. She had a PET scan done. This was in early November. The PET scan showed activity in the posterior right lower lobe. This lesion measured 3 x 2.27 L. There was bilateral hilar node involvement. She had subcarinal lymph node involvement and precarinal lymph node involvement. There is no evidence of metastatic disease.  She then had a CT-guided biopsy. This is done on November 30. The pathology report ([id number]) showed poor differentiated carcinoma that was squamous cell.  I called pathology to see if they could run the PD -L1 assay.  Clinically, she is inoperable. She would have stage IIIB (T1cN3M0) non-small cell lung cancer.  She comes in a wheelchair. Her overall performance status is ECOG 2-3 at best. She looks almost cachectic. She has no appetite. She has some mid thoracic pain. She was on some hydrocodone. This really has not been helping. I will try her on some Ultram.  She does not want chemotherapy. I  think this is reasonable as her performance status is not that good.  There is no history the family of cancer.    Past Medical History:  Diagnosis Date  . Age-related macular degeneration, dry, right eye   . Age-related macular degeneration, wet, left eye ([place])   . Anemia   . Anxiety   . Arthritis    "thumbs, hands" (08/18/2014)  . CAP (community acquired pneumonia) 08/19/2014  . Carotid artery disease ([place])   . Chicken pox   . Chronic lower back pain   . Colon polyp   . Depression   . Diverticulitis   . GERD (gastroesophageal reflux disease)   . History of blood transfusion 1976; 2013   "w/hysterectomy; hip OR"  . History of gout   . Hypertension   . Kidney disease   . 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 TIA (transient ischemic attack) ~ 2009 X 2  . Vocal cord cancer ([place]) ~ 2009   S/P radiation  :  Past Surgical History:  Procedure Laterality Date  . ABDOMINAL HYSTERECTOMY  1976  . APPENDECTOMY  1954  . CAROTID ENDARTERECTOMY Left ~ 2014  . CATARACT EXTRACTION W/ INTRAOCULAR LENS  IMPLANT, BILATERAL Bilateral ~ 2009  . COLONOSCOPY N/A 01/31/2014   Procedure: COLONOSCOPY;  Surgeon: Ladene Artist, MD;  Location: [facility];  Service: Endoscopy;  Laterality: N/A;  . DILATION AND CURETTAGE OF UTERUS    . JOINT REPLACEMENT    .  MOUTH FLOOR MASS EXCISION  03/2014   "removed cancer underneath my tongue"  . TONSILLECTOMY  1941  . TOTAL HIP ARTHROPLASTY Left 2013  :   Current Outpatient Prescriptions:  .  acetaminophen (TYLENOL) 650 MG CR tablet, Take 1,300 mg by mouth at bedtime., Disp: , Rfl:  .  allopurinol (ZYLOPRIM) 100 MG tablet, TAKE 1 TABLET BY MOUTH AT BEDTIME, Disp: 90 tablet, Rfl: 1 .  ALPRAZolam (XANAX) 0.25 MG tablet, Take 1 tablet (0.25 mg total) by mouth as needed for anxiety., Disp: 30 tablet, Rfl: 1 .  amLODipine (NORVASC) 5 MG tablet, TAKE 1 TABLET BY MOUTH DAILY FOR HIGH BLOOD PRESSURE, Disp: 90 tablet, Rfl: 0 .   aspirin 81 MG tablet, Take 81 mg by mouth daily., Disp: , Rfl:  .  diphenhydrAMINE (SIMPLY SLEEP) 25 MG tablet, Take 25 mg by mouth at bedtime as needed for sleep., Disp: , Rfl:  .  docusate sodium (COLACE) 100 MG capsule, Take 100 mg by mouth every morning., Disp: , Rfl:  .  dronabinol (MARINOL) 2.5 MG capsule, Take 1 capsule (2.5 mg total) by mouth 2 (two) times daily before lunch and supper., Disp: 60 capsule, Rfl: 0 .  ferrous sulfate 325 (65 FE) MG tablet, Take 325 mg by mouth 2 (two) times daily with a meal. , Disp: , Rfl:  .  HYDROcodone-acetaminophen (HYCET) 7.5-325 mg/15 ml solution, Take 10 mLs by mouth every 6 (six) hours as needed for moderate pain (pain or cough)., Disp: 120 mL, Rfl: 0 .  LORazepam (ATIVAN) 0.5 MG tablet, Take 0.5 mg by mouth as needed for anxiety. Reported on 05/17/2015, Disp: , Rfl:  .  metoprolol succinate (TOPROL-XL) 25 MG 24 hr tablet, TAKE 1 TABLET(25 MG) BY MOUTH EVERY MORNING, Disp: 90 tablet, Rfl: 0 .  mirtazapine (REMERON SOL-TAB) 15 MG disintegrating tablet, DISSOLVE 1 TABLET ON THE TONGUE AT BEDTIME, Disp: 90 tablet, Rfl: 0 .  Multiple Vitamins-Minerals (HM MULTIVITAMIN ADULT GUMMY) CHEW, Chew 2 tablets by mouth every morning., Disp: , Rfl:  .  omeprazole (PRILOSEC) 20 MG capsule, TAKE ONE CAPSULE BY MOUTH TWICE DAILY BEFORE A MEAL, Disp: 60 capsule, Rfl: 11 .  ondansetron (ZOFRAN-ODT) 8 MG disintegrating tablet, Take 1 tablet (8 mg total) by mouth every 8 (eight) hours as needed for nausea or vomiting. Reported on 05/17/2015, Disp: 60 tablet, Rfl: 0 .  PARoxetine (PAXIL) 10 MG tablet, TAKE 1 TABLET BY MOUTH DAILY FOR DEPRESSION, Disp: 90 tablet, Rfl: 0 .  [name] 0.625 MG tablet, TAKE 1 TABLET BY MOUTH EVERY DAY FOR 21 DAYS, THEN DO NOT TAKE FOR 7 DAYS, Disp: 90 tablet, Rfl: 0 .  traMADol (ULTRAM) 50 MG tablet, Take 1 tablet (50 mg total) by mouth every 6 (six) hours as needed., Disp: 90 tablet, Rfl: 0 .  traZODone (DESYREL) 50 MG tablet, TAKE 1 TABLET(50 MG)  BY MOUTH AT BEDTIME, Disp: 90 tablet, Rfl: 0:  :  Allergies  Allergen Reactions  . Morphine And Related Other (See Comments)    Hallucinations and combative  . Codeine Nausea Only  :  Family History  Problem Relation Age of Onset  . Hypertension Father   . Emphysema Paternal Grandfather   :  Social History   Social History  . Marital status: Widowed    Spouse name: N/A  . Number of children: 1  . Years of education: N/A   Occupational History  . Retired    Social History Main Topics  . Smoking status: Current Every Day  Smoker    Packs/day: 1.00    Years: 60.00    Types: Cigarettes  . Smokeless tobacco: Never Used     Comment: down to 6 cigarettes daily  . Alcohol use 8.4 oz/week    14 Shots of liquor per week     Comment: 08/19/2014 "2 shots brandy q night"  . Drug use: No  . Sexual activity: Not Currently   Other Topics Concern  . Not on file   Social History Narrative  . No narrative on file  :  Pertinent items are noted in HPI.  Exam: '@IPVITALS'$ @ Thin white female in no obvious distress. Vital signs show a temperature of 97.8. Pulse 100. Blood pressure 116/63. Weight is 95 pounds. Head and neck exam shows no ocular or oral lesions. There are no palpable cervical or supraclavicular lymph nodes. There is no palpable thyroid. Lungs are clear bilaterally. There is some occasional wheezes at the bases. Cardiac exam regular rate and rhythm with no murmurs, rubs or bruits. Abdomen is soft. She has good bowel sounds. There is no fluid wave. There is no palpable liver or spleen tip. Back exam shows no tenderness over the spine, ribs or hips. Extremities shows no clubbing, cyanosis or edema. Shows muscle atrophy in upper and lower extremities. She has some symmetric muscle weakness in upper and lower extremities. Skin exam shows some slightly dry skin. No rashes are noted. Neurological exam shows no focal neurological deficits.   No results for input(s): WBC, HGB, HCT,  PLT in the last 72 hours. No results for input(s): NA, K, CL, CO2, GLUCOSE, BUN, CREATININE, CALCIUM in the last 72 hours.  Blood smear review:  None   Pathology: See above     Assessment and Plan:  Ms. Devora is a very charming 81 year old white female. She has locally advanced, inoperable non-small cell lung cancer. This is a squamous cell cancer.  Ideally, radiation and chemotherapy would be appropriate. However, she just is not a candidate for combination therapy. Even systemic chemotherapy I think would be tough on her given her marginal performance status.  We will have to improve her nutritional state. I will put her on some low-dose Marinol. I will give her 2.5 mg by mouth twice a day to try to help with her nutritional intake.  As far as treatment for the lung cancer, possibly single agent radiation would be appropriate.  I forgot to mention earlier that she did have a past history of head and neck cancer. She had radiation therapy for vocal cord cancer. This was about 6 years ago. I think that this was  when she was living in [place]. I would not think that radiation therapy to the chest would not be an option.  I think trying single agent chemotherapy as a radiation sensitizer might be appropriate. Possibly Xeloda could be considered since she has a squamous cell cancer.  I called radiation oncology. They will see her.  I am also going to have pathology run the PD-L1 analysis. Possibly, we could consider using up front immunotherapy for her. Janeal Holmes looked at concurrent immunotherapy with radiation.  I spent about an hour with she and her daughter. They're very nice. I think they have a good understanding as to what the problem is. I told them that I thought his to be very hard, if not impossible to cure unless she could take aggressive therapy or have surgery. Given that this is stage IIIB, surgery is not an option.  I would like  to see her back in about 3 weeks. We will have to  watch her nutritional state closely.

## 2016-02-14 NOTE — Progress Notes (Signed)
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  Biopsies revealed:   02/01/16 Diagnosis Lung, needle/core biopsy(ies), Right Lower Lobe - POORLY DIFFERENTIATED CARCINOMA. - SEE MICROSCOPIC DESCRIPTION.  Tobacco/Marijuana/Snuff/ETOH use: current smoker - smokes 6 cigarettes per day, has smoked for 60 years, has 1 shots of liquor per night.  Past/Anticipated interventions by cardiothoracic surgery, if any: none  Past/Anticipated interventions by medical oncology, if any: possible Xeloda  Signs/Symptoms  Weight changes, if any: she has lost about 15-20 pounds in last 6 months.  She has started taking Marinol but said she has lost 6 lbs in the last month.  Respiratory complaints, if any: reports occasional shortness of breath in the mornings.  She reports having an occasional cough.  Hemoptysis, if any: no  Pain issues, if any:  Yes - has pain in her central chest for about 2.5 months.  She said aleve takes the edge off.  SAFETY ISSUES:  Prior radiation? Yes - [place] 6 years ago per Dr. Antonieta Pert note  Pacemaker/ICD? no   Possible current pregnancy?no  Is the patient on methotrexate? no  Current Complaints / other details:  Patient has a history of vocal cord cancer about 6 years ago and was treated with radiation in [place].  She is here with her daughter.  BP 135/69 (BP Location: Right Arm, Patient Position: Sitting)   Pulse 97   Temp 98.9 F (37.2 C) (Oral)   Ht '5\' 7"'$  (1.702 m)   Wt 88 lb 12.8 oz (40.3 kg)   SpO2 98%   BMI 13.91 kg/m    Wt Readings from Last 3 Encounters:  02/19/16 88 lb 12.8 oz (40.3 kg)  02/01/16 95 lb (43.1 kg)  12/28/15 98 lb (44.5 kg)

## 2016-02-15 ENCOUNTER — Ambulatory Visit: Payer: Medicare Other | Admitting: Adult Health

## 2016-02-19 ENCOUNTER — Encounter: Payer: Self-pay | Admitting: Radiation Oncology

## 2016-02-19 ENCOUNTER — Ambulatory Visit
Admission: RE | Admit: 2016-02-19 | Discharge: 2016-02-19 | Disposition: A | Discharge status: Home / Self Care | Payer: Medicare Other | Source: Ambulatory Visit | Attending: Radiation Oncology | Admitting: Radiation Oncology

## 2016-02-19 VITALS — BP 135/69 | HR 97 | Temp 98.9°F | Ht 67.0 in | Wt 88.8 lb

## 2016-02-19 DIAGNOSIS — C3431 Malignant neoplasm of lower lobe, right bronchus or lung: Secondary | ICD-10-CM

## 2016-02-19 DIAGNOSIS — C778 Secondary and unspecified malignant neoplasm of lymph nodes of multiple regions: Secondary | ICD-10-CM | POA: Diagnosis not present

## 2016-02-19 DIAGNOSIS — Z8521 Personal history of malignant neoplasm of larynx: Secondary | ICD-10-CM | POA: Diagnosis not present

## 2016-02-19 DIAGNOSIS — Z79899 Other long term (current) drug therapy: Secondary | ICD-10-CM | POA: Insufficient documentation

## 2016-02-19 DIAGNOSIS — Z90710 Acquired absence of both cervix and uterus: Secondary | ICD-10-CM | POA: Insufficient documentation

## 2016-02-19 DIAGNOSIS — Z9889 Other specified postprocedural states: Secondary | ICD-10-CM | POA: Insufficient documentation

## 2016-02-19 DIAGNOSIS — Z8249 Family history of ischemic heart disease and other diseases of the circulatory system: Secondary | ICD-10-CM | POA: Insufficient documentation

## 2016-02-19 DIAGNOSIS — Z808 Family history of malignant neoplasm of other organs or systems: Secondary | ICD-10-CM | POA: Insufficient documentation

## 2016-02-19 DIAGNOSIS — F17210 Nicotine dependence, cigarettes, uncomplicated: Secondary | ICD-10-CM | POA: Diagnosis not present

## 2016-02-19 NOTE — Progress Notes (Signed)
Radiation Oncology         [phone] ________________________________  Initial Outpatient Consultation  Name: Netha Dafoe MRN: [id number]  Date: 02/19/2016  DOB: 1934/06/01  CC:Ann Held, DO  Ennever, Rudell Cobb, MD   REFERRING PHYSICIAN: Volanda Napoleon, MD  DIAGNOSIS: The encounter diagnosis was Malignant neoplasm of lower lobe of right lung [facility]). Stage IIIB (T1N3M0) non-small cell lung cancer of the right lower lobe- poorly differentiated, squamous cell  HISTORY OF PRESENT ILLNESS::Mirage Wiens is a 81 y.o. female who presented to the ER on 12/07/15 for shortness of breath. Chest x-ray revealed some opacity in the right lung base. The patient was placed on antibiotics. A follow up chest x-ray on 12/18/15 showed persistent infiltrates. A chest CT on 12/21/15 revealed a lobular mass in the right lower lobe spanning 2.8 x 2.2 cm with mild right pleural effusion and some mediastinal adenopathy. Then the patient presented with a decreased appetite, 15-20 lb weight loss and mid thoracic back discomfort. PET scan on 01/09/16 revealed a lesion in the posterior right lower lobe measuring 3 x 2.27 L with bilateral hilar node involvement as well as subcranial lymph node involvement and paraaortic lymph node involvement. There was no evidence of metastatic disease. Biopsy on 02/01/16 revealed poorly differentiated carcinoma that was squamous cell.   Medical oncology anticipates possible use of Xeloda.  Patient is positive for weight changes. She is taking Marinol, but notes a 6 lb weight loss in the past month. She also reports SOB in the morning, and an occasional cough. She also notes pain in her central left chest for 2.5 months. She takes Aleve with relief.    PREVIOUS RADIATION THERAPY: Yes; Vocal cord cancer 2011. Treated in [place] HISTORY:  has a past medical history of Age-related macular degeneration, dry, right eye; Age-related macular degeneration, wet, left  eye ([place]); Anemia; Anxiety; Arthritis; CAP (community acquired pneumonia) (08/19/2014); Carotid artery disease ([place]); Chicken pox; Chronic lower back pain; Colon polyp; Depression; Diverticulitis; GERD (gastroesophageal reflux disease); History of blood transfusion (1976; 2013); History of gout; Hypertension; Kidney disease; Oral-mouth cancer ([place]) (03/2014); TIA (transient ischemic attack) (~ 2009 X 2); and Vocal cord cancer ([place]) (~ 2009).    PAST SURGICAL HISTORY: Past Surgical History:  Procedure Laterality Date  . ABDOMINAL HYSTERECTOMY  1976  . APPENDECTOMY  1954  . CAROTID ENDARTERECTOMY Left ~ 2014  . CATARACT EXTRACTION W/ INTRAOCULAR LENS  IMPLANT, BILATERAL Bilateral ~ 2009  . COLONOSCOPY N/A 01/31/2014   Procedure: COLONOSCOPY;  Surgeon: Ladene Artist, MD;  Location: [facility];  Service: Endoscopy;  Laterality: N/A;  . DILATION AND CURETTAGE OF UTERUS    . JOINT REPLACEMENT    . MOUTH FLOOR MASS EXCISION  03/2014   "removed cancer underneath my tongue"  . TONSILLECTOMY  1941  . TOTAL HIP ARTHROPLASTY Left 2013    FAMILY HISTORY: family history includes Emphysema in her paternal grandfather; Hypertension in her father; Liver cancer in her mother; Skin cancer in her father; Stomach cancer in her maternal grandfather.  SOCIAL HISTORY:  reports that she has been smoking Cigarettes.  She has a 60.00 pack-year smoking history. She has never used smokeless tobacco. She reports that she drinks about 8.4 oz of alcohol per week . She reports that she does not use drugs.  ALLERGIES: Morphine and related and Codeine  MEDICATIONS:  Current Outpatient Prescriptions  Medication Sig Dispense Refill  . allopurinol (ZYLOPRIM) 100 MG tablet TAKE 1 TABLET BY MOUTH  AT BEDTIME 90 tablet 1  . ALPRAZolam (XANAX) 0.25 MG tablet Take 1 tablet (0.25 mg total) by mouth as needed for anxiety. 30 tablet 1  . amLODipine (NORVASC) 5 MG tablet TAKE 1 TABLET BY MOUTH DAILY FOR HIGH BLOOD PRESSURE 90 tablet  0  . aspirin 81 MG tablet Take 81 mg by mouth daily.    . diphenhydrAMINE (SIMPLY SLEEP) 25 MG tablet Take 25 mg by mouth at bedtime as needed for sleep.    Marland Kitchen docusate sodium (COLACE) 100 MG capsule Take 100 mg by mouth every morning.    . dronabinol (MARINOL) 2.5 MG capsule Take 1 capsule (2.5 mg total) by mouth 2 (two) times daily before lunch and supper. 60 capsule 0  . ferrous sulfate 325 (65 FE) MG tablet Take 325 mg by mouth 2 (two) times daily with a meal.     . LORazepam (ATIVAN) 0.5 MG tablet Take 0.5 mg by mouth as needed for anxiety. Reported on 05/17/2015    . metoprolol succinate (TOPROL-XL) 25 MG 24 hr tablet TAKE 1 TABLET(25 MG) BY MOUTH EVERY MORNING 90 tablet 0  . mirtazapine (REMERON SOL-TAB) 15 MG disintegrating tablet DISSOLVE 1 TABLET ON THE TONGUE AT BEDTIME 90 tablet 0  . Multiple Vitamins-Minerals (HM MULTIVITAMIN ADULT GUMMY) CHEW Chew 2 tablets by mouth every morning.    . Naproxen Sodium 220 MG CAPS Take by mouth.    Marland Kitchen omeprazole (PRILOSEC) 20 MG capsule TAKE ONE CAPSULE BY MOUTH TWICE DAILY BEFORE A MEAL 60 capsule 11  . ondansetron (ZOFRAN-ODT) 8 MG disintegrating tablet Take 1 tablet (8 mg total) by mouth every 8 (eight) hours as needed for nausea or vomiting. Reported on 05/17/2015 60 tablet 0  . PARoxetine (PAXIL) 10 MG tablet TAKE 1 TABLET BY MOUTH DAILY FOR DEPRESSION 90 tablet 0  . [name] 0.625 MG tablet TAKE 1 TABLET BY MOUTH EVERY DAY FOR 21 DAYS, THEN DO NOT TAKE FOR 7 DAYS 90 tablet 0  . traZODone (DESYREL) 50 MG tablet TAKE 1 TABLET(50 MG) BY MOUTH AT BEDTIME 90 tablet 0  . acetaminophen (TYLENOL) 650 MG CR tablet Take 1,300 mg by mouth at bedtime.    . traMADol (ULTRAM) 50 MG tablet Take 1 tablet (50 mg total) by mouth every 6 (six) hours as needed. (Patient not taking: Reported on 02/19/2016) 90 tablet 0   No current facility-administered medications for this encounter.     REVIEW OF SYSTEMS:  A 12 point review of systems is documented in the electronic  medical record. This was obtained by the nursing staff. However, I reviewed this with the patient to discuss relevant findings and make appropriate changes. No headaches or double vision   PHYSICAL EXAM:  height is '5\' 7"'$  (1.702 m) and weight is 88 lb 12.8 oz (40.3 kg). Her oral temperature is 98.9 F (37.2 C). Her blood pressure is 135/69 and her pulse is 97. Her oxygen saturation is 98%.    General: Alert and oriented, in no acute distress, temporal wasting, emaciated appearance,  accompanied by daughter HEENT: Head is normocephalic. Extraocular movements are intact. Oropharynx is clear. Neck: Neck is supple, no palpable cervical or supraclavicular lymphadenopathy. Mild radiation changes along the anterior neck from prior vocal cord radiation treatment Heart: Regular in rate and rhythm with no murmurs, rubs, or gallops. Chest: Clear to auscultation bilaterally, with no rhonchi, wheezes, or rales. Abdomen: Soft, nontender, nondistended, with no rigidity or guarding. Extremities: No cyanosis or edema. Lymphatics: see Neck Exam Skin: No  concerning lesions. Musculoskeletal: symmetric strength and muscle tone throughout. Neurologic: Cranial nerves II through XII are grossly intact. No obvious focalities. Speech is fluent. Coordination is intact. Psychiatric: Judgment and insight are intact. Affect is appropriate.    ECOG = 2  LABORATORY DATA:  Lab Results  Component Value Date   WBC 10.2 02/01/2016   HGB 11.5 (L) 02/01/2016   HCT 35.5 (L) 02/01/2016   MCV 92.0 02/01/2016   PLT 345 02/01/2016   NEUTROABS 7.0 02/01/2016   Lab Results  Component Value Date   NA 133 (L) 02/01/2016   K 3.5 02/01/2016   CL 95 (L) 02/01/2016   CO2 28 02/01/2016   GLUCOSE 96 02/01/2016   CREATININE 1.35 (H) 02/01/2016   CALCIUM 9.1 02/01/2016      RADIOGRAPHY: Dg Chest 1 View  Result Date: 02/01/2016 CLINICAL DATA:  Right lung biopsy under CT guidance, evaluate for pneumothorax EXAM: CHEST 1 VIEW  COMPARISON:  CT biopsy of 02/01/2016 FINDINGS: The mass at the right lung base is well visualized. No pneumothorax is seen. No effusion is noted. The left lung is clear. IMPRESSION: No pneumothorax after right lung mass biopsy. Electronically Signed   By: Ivar Drape M.D.   On: 02/01/2016 10:26   Ct Biopsy  Result Date: 02/01/2016 INDICATION: 81 year old female with a history of right lower lobe hypermetabolic lesion, referred for biopsy. EXAM: CT BIOPSY MEDICATIONS: None. ANESTHESIA/SEDATION: Moderate (conscious) sedation was employed during this procedure. A total of Versed 2.0 mg and Fentanyl 75 mcg was administered intravenously. Moderate Sedation Time: 20 minutes. The patient's level of consciousness and vital signs were monitored continuously by radiology nursing throughout the procedure under my direct supervision. FLUOROSCOPY TIME:  CT COMPLICATIONS: None PROCEDURE: The procedure, risks, benefits, and alternatives were explained to the patient and the patient's family. Specific risks that were addressed included bleeding, infection, pneumothorax, need for further procedure including chest tube placement, chance of delayed pneumothorax or hemorrhage, hemoptysis, nondiagnostic sample, cardiopulmonary collapse, death. Questions regarding the procedure were encouraged and answered. The patient understands and consents to the procedure. Patient was positioned in the right decubitus position on the CT gantry table and a scout CT of the chest was performed for planning purposes. Once angle of approach was determined, the skin and subcutaneous tissues this scan was prepped and draped in the usual sterile fashion, and a sterile drape was applied covering the operative field. A sterile gown and sterile gloves were used for the procedure. Local anesthesia was provided with 1% Lidocaine. The skin and subcutaneous tissues were infiltrated 1% lidocaine for local anesthesia, and a small stab incision was made with an  11 blade scalpel. Using CT guidance, a 17 gauge trocar needle was advanced into the right lower lobetarget. After confirmation of the tip, separate 18 gauge core biopsies were performed. These were placed into solution for transportation to the lab. Biosentry Device was deployed. A final CT image was performed. Patient tolerated the procedure well and remained hemodynamically stable throughout. No complications were encountered and no significant blood loss was encounter IMPRESSION: Status post CT-guided biopsy of hypermetabolic right lower lobe nodule, with tissue specimen sent to pathology for complete histopathologic analysis. Signed, Dulcy Fanny. Earleen Newport, DO [facility] Electronically Signed   By: Corrie Mckusick D.O.   On: 02/01/2016 09:42      IMPRESSION: Stage IIIB non-small cell lung cancer. Patient has Contralateral hilar metastasis, mediastinal metastasis and a large lesion in the right lower lung. To  cover all of these areas would require significant coverage of the esophagus and lung area. Given the patient's performance status, I do not belief she could tolerate full dose radiation therapy. At this point she does not have any symptoms that would benefit from palliative radiation therapy. She would not be able to tolerate full dose chemotherapy given her performance status . She does have mild left chest pain, but this is unexplained by her imaging studies. Patient and her daughter agree that she would not be able to tolerate  radiation treatment.  PLAN: Follow up with radiation oncology prn. Patient will meet with Dr. Marin Olp soon. Genetic testing is pending at this time. If she does not have any markers for targeted therapy or immunotherapy she may consider hospice referral.   ------------------------------------------------  Blair Promise, PhD, MD  This document serves as a record of services personally performed by Gery Pray, MD. It  was created on his behalf by Bethann Humble, a trained medical scribe. The creation of this record is based on the scribe's personal observations and the provider's statements to them. This document has been checked and approved by the attending provider.

## 2016-02-19 NOTE — Progress Notes (Signed)
Please see the Nurse Progress Note in the MD Initial Consult Encounter for this patient. 

## 2016-02-20 ENCOUNTER — Ambulatory Visit: Payer: Medicare Other | Admitting: Family Medicine

## 2016-02-27 ENCOUNTER — Encounter (HOSPITAL_COMMUNITY): Payer: Self-pay

## 2016-02-29 ENCOUNTER — Encounter: Payer: Self-pay | Admitting: *Deleted

## 2016-02-29 NOTE — Progress Notes (Signed)
[place] Psychosocial Distress Screening Clinical Social Work  Clinical Social Work was referred by distress screening protocol.  The patient scored a 9 on the Psychosocial Distress Thermometer which indicates severe distress. Clinical Social Worker reviewed chart to assess for distress and other psychosocial needs. Pt appears not a candidate for radiation or chemotherapy and may be considering hospice. MD addressed her physical concerns at her appointment. If pt decides to pursue treatment, CSW available as needed.   ONCBCN DISTRESS SCREENING 02/19/2016  Screening Type Initial Screening  Distress experienced in past week (1-10) 9  Emotional problem type Nervousness/Anxiety;Adjusting to illness;Boredom  Physical Problem type Pain;Nausea/vomiting;Constipation/diarrhea;Loss of appetitie;Tingling hands/feet    Clinical Social Worker follow up needed: No.  If yes, follow up plan:  Loren Racer, [place]  [facility] Phone: [phone] Fax: [phone]

## 2016-03-05 ENCOUNTER — Encounter: Payer: Self-pay | Admitting: Family Medicine

## 2016-03-05 ENCOUNTER — Ambulatory Visit (INDEPENDENT_AMBULATORY_CARE_PROVIDER_SITE_OTHER): Payer: Medicare Other | Admitting: Family Medicine

## 2016-03-05 VITALS — BP 124/75 | HR 84 | Temp 98.3°F | Resp 16 | Ht 67.0 in | Wt 89.8 lb

## 2016-03-05 DIAGNOSIS — R112 Nausea with vomiting, unspecified: Secondary | ICD-10-CM | POA: Diagnosis not present

## 2016-03-05 DIAGNOSIS — E43 Unspecified severe protein-calorie malnutrition: Secondary | ICD-10-CM

## 2016-03-05 DIAGNOSIS — C3431 Malignant neoplasm of lower lobe, right bronchus or lung: Secondary | ICD-10-CM

## 2016-03-05 MED ORDER — ONDANSETRON 8 MG PO TBDP: 8.0000 mg | ORAL_TABLET | Freq: Three times a day (TID) | ORAL | 5 refills | Status: AC | PRN

## 2016-03-05 NOTE — Patient Instructions (Signed)
Lung Cancer Lung cancer occurs when abnormal cells in the lung grow out of control and form a mass (tumor). There are several types of lung cancer. The two most common types are:  Non-small cell. In this type of lung cancer, abnormal cells are larger and grow more slowly than those of small cell lung cancer.  Small cell. In this type of lung cancer, abnormal cells are smaller than those of non-small cell lung cancer. Small cell lung cancer gets worse faster than non-small cell lung cancer.  What are the causes? The leading cause of lung cancer is smoking tobacco. The second leading cause is radon exposure. What increases the risk?  Smoking tobacco.  Exposure to secondhand tobacco smoke.  Exposure to radon gas.  Exposure to asbestos.  Exposure to arsenic in drinking water.  Air pollution.  Family or personal history of lung cancer.  Lung radiation therapy.  Being older than 65 years. What are the signs or symptoms? In the early stages, symptoms may not be present. As the cancer progresses, symptoms may include:  A lasting cough, possibly with blood.  Fatigue.  Unexplained weight loss.  Shortness of breath.  Wheezing.  Chest pain.  Loss of appetite.  Symptoms of advanced lung cancer include:  Hoarseness.  Bone or joint pain.  Weakness.  Nail problems.  Face or arm swelling.  Paralysis of the face.  Drooping eyelids.  How is this diagnosed? Lung cancer can be identified with a physical exam and with tests such as:  A chest X-ray.  A CT scan.  Blood tests.  A biopsy.  After a diagnosis is made, you will have more tests to determine the stage of the cancer. The stages of non-small cell lung cancer are:  Stage 0, also called carcinoma in situ. At this stage, abnormal cells are found in the inner lining of your lung or lungs.  Stage I. At this stage, abnormal cells have grown into a tumor that is no larger than 5 cm across. The cancer has entered  the deeper lung tissue but has not yet entered the lymph nodes or other parts of the body.  Stage II. At this stage, the tumor is 7 cm across or smaller and has entered nearby lymph nodes. Or, the tumor is 5 cm across or smaller and has invaded surrounding tissue but is not found in nearby lymph nodes. There may be more than one tumor present.  Stage III. At this stage, the tumor may be any size. There may be more than one tumor in the lungs. The cancer cells have spread to the lymph nodes and possibly to other organs.  Stage IV. At this stage, there are tumors in both lungs and the cancer has spread to other areas of the body.  The stages of small cell lung cancer are:  Limited. At this stage, the cancer is found only on one side of the chest.  Extensive. At this stage, the cancer is in the lungs and in tissues on the other side of the chest. The cancer has spread to other organs or is found in the fluid between the layers of your lungs.  How is this treated? Depending on the type and stage of your lung cancer, you may be treated with:  Surgery. This is done to remove a tumor.  Radiation therapy. This treatment destroys cancer cells using X-rays or other types of radiation.  Chemotherapy. This treatment uses medicines to destroy cancer cells.  Targeted therapy. This treatment   aims to destroy only cancer cells instead of all cells as other therapies do.  You may also have a combination of treatments. Follow these instructions at home:  Do not use any tobacco products. This includes cigarettes, chewing tobacco, and electronic cigarettes. If you need help quitting, ask your health care provider.  Take medicines only as directed by your health care provider.  Eat a healthy diet. Work with a dietitian to make sure you are getting the nutrition you need.  Consider joining a support group or seeking counseling to help you cope with the stress of having lung cancer.  Let your cancer  specialist (oncologist) know if you are admitted to the hospital.  Keep all follow-up visits as directed by your health care provider. This is important. Contact a health care provider if:  You lose weight without trying.  You have a persistent cough and wheezing.  You feel short of breath.  You tire easily.  You experience bone or joint pain.  You have difficulty swallowing.  You feel hoarse or notice your voice changing.  Your pain medicine is not helping. Get help right away if:  You cough up blood.  You have new breathing problems.  You develop chest pain.  You develop swelling in: ? One or both ankles or legs. ? Your face, neck, or arms.  You are confused.  You experience paralysis in your face or a drooping eyelid. This information is not intended to replace advice given to you by your health care provider. Make sure you discuss any questions you have with your health care provider. Document Released: 05/27/2000 Document Revised: 07/27/2015 Document Reviewed: 06/24/2013 Elsevier Interactive Patient Education  2017 Elsevier Inc.  

## 2016-03-05 NOTE — Progress Notes (Signed)
Pre visit review using our clinic review tool, if applicable. No additional management support is needed unless otherwise documented below in the visit note. 

## 2016-03-05 NOTE — Assessment & Plan Note (Signed)
Pt to f/u oncology --- pt saw radiology onc and no chemo or radiation will be done  Pt to f/u onc

## 2016-03-05 NOTE — Progress Notes (Signed)
Patient ID: Marissa Cruz, female    DOB: March 07, 1934  Age: 82 y.o. MRN: [id number]    Subjective:  Subjective  HPI Marissa Cruz presents for f/u--- pt has been seen by onc and onc radiology--- pt is not a good candidate for chemo or radiation---- pt waiting to see oncology for further recommendations.      Review of Systems  Constitutional: Negative for appetite change, diaphoresis, fatigue and unexpected weight change.  Eyes: Negative for pain, redness and visual disturbance.  Respiratory: Negative for cough, chest tightness, shortness of breath and wheezing.   Cardiovascular: Negative for chest pain, palpitations and leg swelling.  Endocrine: Negative for cold intolerance, heat intolerance, polydipsia, polyphagia and polyuria.  Genitourinary: Negative for difficulty urinating, dysuria and frequency.  Neurological: Negative for dizziness, light-headedness, numbness and headaches.    History Past Medical History:  Diagnosis Date  . Age-related macular degeneration, dry, right eye   . Age-related macular degeneration, wet, left eye (Marissa Cruz)   . Anemia   . Anxiety   . Arthritis    "thumbs, hands" (08/18/2014)  . CAP (community acquired pneumonia) 08/19/2014  . Carotid artery disease (Marissa Cruz)   . Chicken pox   . Chronic lower back pain   . Colon polyp   . Depression   . Diverticulitis   . GERD (gastroesophageal reflux disease)   . History of blood transfusion 1976; 2013   "w/hysterectomy; hip OR"  . History of gout   . Hypertension   . Kidney disease   . 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 TIA (transient ischemic attack) ~ 2009 X 2  . Vocal cord cancer (Marissa Cruz) ~ 2009   S/P radiation    She has a past surgical history that includes Abdominal hysterectomy (1976); Total hip arthroplasty (Left, 2013); Carotid endarterectomy (Left, ~ 2014); Colonoscopy (N/A, 01/31/2014); Mouth floor mass excision (03/2014); Tonsillectomy (1941); Appendectomy (1954); Joint  replacement; Dilation and curettage of uterus; and Cataract extraction w/ intraocular lens  implant, bilateral (Bilateral, ~ 2009).   Her family history includes Emphysema in her paternal grandfather; Hypertension in her father; Liver cancer in her mother; Skin cancer in her father; Stomach cancer in her maternal grandfather.She reports that she has been smoking Cigarettes.  She has a 60.00 pack-year smoking history. She has never used smokeless tobacco. She reports that she drinks about 8.4 oz of alcohol per week . She reports that she does not use drugs.  Current Outpatient Prescriptions on File Prior to Visit  Medication Sig Dispense Refill  . acetaminophen (TYLENOL) 650 MG CR tablet Take 1,300 mg by mouth at bedtime.    Marland Kitchen allopurinol (ZYLOPRIM) 100 MG tablet TAKE 1 TABLET BY MOUTH AT BEDTIME 90 tablet 1  . ALPRAZolam (XANAX) 0.25 MG tablet Take 1 tablet (0.25 mg total) by mouth as needed for anxiety. 30 tablet 1  . amLODipine (NORVASC) 5 MG tablet TAKE 1 TABLET BY MOUTH DAILY FOR HIGH BLOOD PRESSURE 90 tablet 0  . aspirin 81 MG tablet Take 81 mg by mouth daily.    . diphenhydrAMINE (SIMPLY SLEEP) 25 MG tablet Take 25 mg by mouth at bedtime as needed for sleep.    Marland Kitchen docusate sodium (COLACE) 100 MG capsule Take 100 mg by mouth every morning.    . dronabinol (MARINOL) 2.5 MG capsule Take 1 capsule (2.5 mg total) by mouth 2 (two) times daily before lunch and supper. 60 capsule 0  . ferrous sulfate 325 (65 FE) MG tablet Take  325 mg by mouth 2 (two) times daily with a meal.     . LORazepam (ATIVAN) 0.5 MG tablet Take 0.5 mg by mouth as needed for anxiety. Reported on 05/17/2015    . metoprolol succinate (TOPROL-XL) 25 MG 24 hr tablet TAKE 1 TABLET(25 MG) BY MOUTH EVERY MORNING 90 tablet 0  . mirtazapine (REMERON SOL-TAB) 15 MG disintegrating tablet DISSOLVE 1 TABLET ON THE TONGUE AT BEDTIME 90 tablet 0  . Multiple Vitamins-Minerals (HM MULTIVITAMIN ADULT GUMMY) CHEW Chew 2 tablets by mouth every  morning.    . Naproxen Sodium 220 MG CAPS Take by mouth.    Marland Kitchen omeprazole (PRILOSEC) 20 MG capsule TAKE ONE CAPSULE BY MOUTH TWICE DAILY BEFORE A MEAL 60 capsule 11  . PARoxetine (PAXIL) 10 MG tablet TAKE 1 TABLET BY MOUTH DAILY FOR DEPRESSION 90 tablet 0  . [name] 0.625 MG tablet TAKE 1 TABLET BY MOUTH EVERY DAY FOR 21 DAYS, THEN DO NOT TAKE FOR 7 DAYS 90 tablet 0  . traMADol (ULTRAM) 50 MG tablet Take 1 tablet (50 mg total) by mouth every 6 (six) hours as needed. 90 tablet 0  . traZODone (DESYREL) 50 MG tablet TAKE 1 TABLET(50 MG) BY MOUTH AT BEDTIME 90 tablet 0   No current facility-administered medications on file prior to visit.      Objective:  Objective  Physical Exam  Constitutional: She is oriented to person, place, and time. She appears cachectic.  HENT:  Head: Normocephalic and atraumatic.  Eyes: Conjunctivae and EOM are normal.  Neck: Normal range of motion. Neck supple. No JVD present. Carotid bruit is not present. No thyromegaly present.  Cardiovascular: Normal rate, regular rhythm and normal heart sounds.   No murmur heard. Pulmonary/Chest: Effort normal. No respiratory distress. She has decreased breath sounds. She has no wheezes. She has no rales. She exhibits no tenderness.  Musculoskeletal: She exhibits no edema.  Neurological: She is alert and oriented to person, place, and time.  Psychiatric: She has a normal mood and affect.  Nursing note and vitals reviewed.  BP 124/75 (BP Location: Left Arm, Patient Position: Sitting, Cuff Size: Small)   Pulse 84   Temp 98.3 F (36.8 C) (Oral)   Resp 16   Ht '5\' 7"'$  (1.702 m)   Wt 89 lb 12.8 oz (40.7 kg)   SpO2 95%   BMI 14.06 kg/m  Wt Readings from Last 3 Encounters:  03/05/16 89 lb 12.8 oz (40.7 kg)  02/19/16 88 lb 12.8 oz (40.3 kg)  02/01/16 95 lb (43.1 kg)     Lab Results  Component Value Date   WBC 10.2 02/01/2016   HGB 11.5 (L) 02/01/2016   HCT 35.5 (L) 02/01/2016   PLT 345 02/01/2016   GLUCOSE 96  02/01/2016   CHOL 183 11/21/2015   TRIG 124.0 11/21/2015   HDL 94.30 11/21/2015   LDLDIRECT 73.2 10/21/2013   LDLCALC 64 11/21/2015   ALT 7 12/18/2015   AST 15 12/18/2015   NA 133 (L) 02/01/2016   K 3.5 02/01/2016   CL 95 (L) 02/01/2016   CREATININE 1.35 (H) 02/01/2016   BUN 15 02/01/2016   CO2 28 02/01/2016   TSH 3.95 05/24/2014   INR 0.87 02/01/2016    No results found.   Assessment & Plan:  Plan  I am having Ms. Nester maintain her ferrous sulfate, docusate sodium, aspirin, HM MULTIVITAMIN ADULT GUMMY, acetaminophen, diphenhydrAMINE, LORazepam, omeprazole, ALPRAZolam, amLODipine, PARoxetine, metoprolol succinate, mirtazapine, [name], traZODone, allopurinol, dronabinol, traMADol, Naproxen Sodium, and ondansetron.  Meds ordered this encounter  Medications  . ondansetron (ZOFRAN-ODT) 8 MG disintegrating tablet    Sig: Take 1 tablet (8 mg total) by mouth every 8 (eight) hours as needed for nausea or vomiting. Reported on 05/17/2015    Dispense:  90 tablet    Refill:  5    Problem List Items Addressed This Visit      Unprioritized   Malignant neoplasm of lower lobe of right lung ([place])    Pt to f/u oncology --- pt saw radiology onc and no chemo or radiation will be done  Pt to f/u onc       Relevant Medications   ondansetron (ZOFRAN-ODT) 8 MG disintegrating tablet   Protein-calorie malnutrition ([place])    Due to CA Pt eating what ever she wants--- appetite decreased meds per oncology        Other Visit Diagnoses    Cancer of lower lobe of right lung ([place])    -  Primary   Relevant Medications   ondansetron (ZOFRAN-ODT) 8 MG disintegrating tablet   Nausea and vomiting, intractability of vomiting not specified, unspecified vomiting type       Relevant Medications   ondansetron (ZOFRAN-ODT) 8 MG disintegrating tablet    meds per oncology rto prn   Follow-up: Return in about 6 months (around 09/02/2016).  Ann Held, DO

## 2016-03-06 NOTE — Assessment & Plan Note (Signed)
Due to CA Pt eating what ever she wants--- appetite decreased meds per oncology

## 2016-03-07 ENCOUNTER — Ambulatory Visit (HOSPITAL_BASED_OUTPATIENT_CLINIC_OR_DEPARTMENT_OTHER): Payer: Medicare Other | Admitting: Hematology & Oncology

## 2016-03-07 ENCOUNTER — Other Ambulatory Visit (HOSPITAL_BASED_OUTPATIENT_CLINIC_OR_DEPARTMENT_OTHER): Payer: Medicare Other

## 2016-03-07 ENCOUNTER — Encounter: Payer: Self-pay | Admitting: Hematology & Oncology

## 2016-03-07 VITALS — BP 96/48 | HR 90 | Temp 97.5°F | Wt 89.1 lb

## 2016-03-07 DIAGNOSIS — C3431 Malignant neoplasm of lower lobe, right bronchus or lung: Secondary | ICD-10-CM

## 2016-03-07 DIAGNOSIS — R634 Abnormal weight loss: Secondary | ICD-10-CM | POA: Diagnosis not present

## 2016-03-07 DIAGNOSIS — R64 Cachexia: Secondary | ICD-10-CM

## 2016-03-07 DIAGNOSIS — Z7189 Other specified counseling: Secondary | ICD-10-CM | POA: Insufficient documentation

## 2016-03-07 DIAGNOSIS — C3490 Malignant neoplasm of unspecified part of unspecified bronchus or lung: Secondary | ICD-10-CM

## 2016-03-07 HISTORY — DX: Other specified counseling: Z71.89

## 2016-03-07 LAB — CBC WITH DIFFERENTIAL (CANCER CENTER ONLY)
BASO#: 0.1 10*3/uL (ref 0.0–0.2)
BASO%: 0.6 % (ref 0.0–2.0)
EOS ABS: 0.1 10*3/uL (ref 0.0–0.5)
EOS%: 1.1 % (ref 0.0–7.0)
HEMATOCRIT: 32.2 % — AB (ref 34.8–46.6)
HEMOGLOBIN: 10.3 g/dL — AB (ref 11.6–15.9)
LYMPH#: 1.1 10*3/uL (ref 0.9–3.3)
LYMPH%: 8.4 % — ABNORMAL LOW (ref 14.0–48.0)
MCH: 31.8 pg (ref 26.0–34.0)
MCHC: 32 g/dL (ref 32.0–36.0)
MCV: 99 fL (ref 81–101)
MONO#: 0.9 10*3/uL (ref 0.1–0.9)
MONO%: 7.1 % (ref 0.0–13.0)
NEUT#: 10.4 10*3/uL — ABNORMAL HIGH (ref 1.5–6.5)
NEUT%: 82.8 % — ABNORMAL HIGH (ref 39.6–80.0)
PLATELETS: 370 10*3/uL (ref 145–400)
RBC: 3.24 10*6/uL — AB (ref 3.70–5.32)
RDW: 15.9 % — ABNORMAL HIGH (ref 11.1–15.7)
WBC: 12.5 10*3/uL — AB (ref 3.9–10.0)

## 2016-03-07 LAB — COMPREHENSIVE METABOLIC PANEL
ALBUMIN: 3.3 g/dL — AB (ref 3.5–5.0)
ALK PHOS: 83 U/L (ref 40–150)
ALT: 9 U/L (ref 0–55)
ANION GAP: 9 meq/L (ref 3–11)
AST: 17 U/L (ref 5–34)
BILIRUBIN TOTAL: 0.26 mg/dL (ref 0.20–1.20)
BUN: 28.7 mg/dL — ABNORMAL HIGH (ref 7.0–26.0)
CALCIUM: 9.5 mg/dL (ref 8.4–10.4)
CO2: 26 meq/L (ref 22–29)
CREATININE: 1.3 mg/dL — AB (ref 0.6–1.1)
Chloride: 101 mEq/L (ref 98–109)
EGFR: 38 mL/min/{1.73_m2} — ABNORMAL LOW (ref >90)
Glucose: 114 mg/dl (ref 70–140)
Potassium: 4.1 mEq/L (ref 3.5–5.1)
Sodium: 136 mEq/L (ref 136–145)
TOTAL PROTEIN: 6.5 g/dL (ref 6.4–8.3)

## 2016-03-07 MED ORDER — MEGESTROL ACETATE 40 MG/ML PO SUSP: 400.0000 mg | Freq: Every day | ORAL | 2 refills | Status: DC

## 2016-03-07 NOTE — Progress Notes (Signed)
Hematology and Oncology Follow Up Visit  Marissa Cruz [id number] Aug 11, 1934 82 y.o. 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 We first saw her in early December. At that point time, the issue is how to try to treat her. Unfortunately, she is not a surgical candidate. We sent her to radiation oncology. They do not feel that she was a good candidate for therapy.  She really has had issues with weight loss. Her appetite just is not that great. I tried her on Marinol. She got a little dizzy with Marinol and stopped taking this.  She's had no issues with pain. She's had no cough. She's had no nausea or vomiting. She's had no fever. She's had no bleeding.  Fortunately, we did run a PD-L1 assay on the tumor. She has a tumor with a very high expression of PD-L1  Overall, her performance status is ECOG 2.  Medications:  Current Outpatient Prescriptions:  .  acetaminophen (TYLENOL) 650 MG CR tablet, Take 1,300 mg by mouth at bedtime., Disp: , Rfl:  .  allopurinol (ZYLOPRIM) 100 MG tablet, TAKE 1 TABLET BY MOUTH AT BEDTIME, Disp: 90 tablet, Rfl: 1 .  ALPRAZolam (XANAX) 0.25 MG tablet, Take 1 tablet (0.25 mg total) by mouth as needed for anxiety., Disp: 30 tablet, Rfl: 1 .  aspirin 81 MG tablet, Take 81 mg by mouth daily., Disp: , Rfl:  .  diphenhydrAMINE (SIMPLY SLEEP) 25 MG tablet, Take 25 mg by mouth at bedtime as needed for sleep., Disp: , Rfl:  .  docusate sodium (COLACE) 100 MG capsule, Take 100 mg by mouth every morning., Disp: , Rfl:  .  LORazepam (ATIVAN) 0.5 MG tablet, Take 0.5 mg by mouth as needed for anxiety. Reported on 05/17/2015, Disp: , Rfl:  .  metoprolol succinate (TOPROL-XL) 25 MG 24 hr tablet, TAKE 1 TABLET(25 MG) BY MOUTH EVERY MORNING, Disp: 90 tablet, Rfl: 0 .  mirtazapine (REMERON SOL-TAB)  15 MG disintegrating tablet, DISSOLVE 1 TABLET ON THE TONGUE AT BEDTIME, Disp: 90 tablet, Rfl: 0 .  Multiple Vitamins-Minerals (HM MULTIVITAMIN ADULT GUMMY) CHEW, Chew 2 tablets by mouth every morning., Disp: , Rfl:  .  Naproxen Sodium 220 MG CAPS, Take by mouth., Disp: , Rfl:  .  omeprazole (PRILOSEC) 20 MG capsule, TAKE ONE CAPSULE BY MOUTH TWICE DAILY BEFORE A MEAL, Disp: 60 capsule, Rfl: 11 .  ondansetron (ZOFRAN-ODT) 8 MG disintegrating tablet, Take 1 tablet (8 mg total) by mouth every 8 (eight) hours as needed for nausea or vomiting. Reported on 05/17/2015, Disp: 90 tablet, Rfl: 5 .  PARoxetine (PAXIL) 10 MG tablet, TAKE 1 TABLET BY MOUTH DAILY FOR DEPRESSION, Disp: 90 tablet, Rfl: 0 .  [name] 0.625 MG tablet, TAKE 1 TABLET BY MOUTH EVERY DAY FOR 21 DAYS, THEN DO NOT TAKE FOR 7 DAYS, Disp: 90 tablet, Rfl: 0 .  traMADol (ULTRAM) 50 MG tablet, Take 1 tablet (50 mg total) by mouth every 6 (six) hours as needed., Disp: 90 tablet, Rfl: 0 .  traZODone (DESYREL) 50 MG tablet, TAKE 1 TABLET(50 MG) BY MOUTH AT BEDTIME, Disp: 90 tablet, Rfl: 0 .  megestrol (MEGACE) 40 MG/ML suspension, Take 10 mLs (400 mg total) by mouth daily., Disp: 480 mL, Rfl: 2  Allergies:  Allergies  Allergen Reactions  . Morphine And Related Other (See Comments)  Hallucinations and combative  . Codeine Nausea Only    Past Medical History, Surgical history, Social history, and Family History were reviewed and updated.  Review of Systems: As above  Physical Exam:  weight is 89 lb 1.9 oz (40.4 kg). Her oral temperature is 97.5 F (36.4 C). Her blood pressure is 96/48 (abnormal) and her pulse is 90.   Wt Readings from Last 3 Encounters:  03/07/16 89 lb 1.9 oz (40.4 kg)  03/05/16 89 lb 12.8 oz (40.7 kg)  02/19/16 88 lb 12.8 oz (40.3 kg)     Thin white female in no obvious distress. Head and neck exam shows no ocular or oral lesions. She has no palpable cervical or supraclavicular lymph nodes. Lungs are clear.  There may be some slight decrease over on the right side. Cardiac exam regular rate and rhythm with no murmurs, rubs or bruits. Abdomen is soft. She has good bowel sounds. There is no fluid wave. There is no palpable liver or spleen tip. Back exam shows no tenderness over the spine, ribs or hips. Externally shows no clubbing, cyanosis or edema. Neurological exam shows no focal neurological deficits. Skin exam shows no rashes, ecchymoses or petechia.  Lab Results  Component Value Date   WBC 12.5 (H) 03/07/2016   HGB 10.3 (L) 03/07/2016   HCT 32.2 (L) 03/07/2016   MCV 99 03/07/2016   PLT 370 03/07/2016     Chemistry      Component Value Date/Time   NA 133 (L) 02/01/2016 0630   K 3.5 02/01/2016 0630   CL 95 (L) 02/01/2016 0630   CO2 28 02/01/2016 0630   BUN 15 02/01/2016 0630   CREATININE 1.35 (H) 02/01/2016 0630      Component Value Date/Time   CALCIUM 9.1 02/01/2016 0630   ALKPHOS 60 12/18/2015 1354   AST 15 12/18/2015 1354   ALT 7 12/18/2015 1354   BILITOT 0.2 12/18/2015 1354         Impression and Plan: Marissa Cruz is a 81 year old white female. She has a locally advanced, inoperable, non-small cell lung cancer. She is not a candidate for chemotherapy or radiation therapy. She is not a candidate for surgery.  Thankfully, she has a high PD-L1 expression. As such, I think she would be a great candidate for immunotherapy. I did this would be reasonable.  I talked to her and her daughter about immunotherapy. I told her that immunotherapy would help shrink the tumor but certainly would not cured in my mind.  We discussed goals of care. I talked to her about the fact that since she really is not a candidate for aggressive intervention, that she will not be able to have this cancer cured. Our goal of care would be to improve the cancer so that her symptoms would improve. The biggest symptom that needs improvement is her weight gain.  I told her that she continue to lose weight, that  her body would fail and that she would not survive. I think she does understand this.  She understands that treating the malignancy is to try to benefit her longevity. Again she understands that this is some that is not going to be curable.  I went over the side effects of immunotherapy. I talked her about possibility of diarrhea, skin rash, hypothyroidism, hepatitis, etc. She understands all this.  She will need a Port-A-Cath. I explained what a Port-A-Cath is. She agrees to have one placed.  I spent about 45 minutes that she and her daughter.  We had a long talk about our goals of treating her. Again, the goal really is to help with symptom management and to try to get her to gain weight. I think with immunotherapy, the chance of causing the cancer to regress should be over 50%.  We will see her back for cycle 2 of treatment on February 1. I would do 3 cycles and then repeat her scans.   Volanda Napoleon, MD 1/4/20181:31 PM

## 2016-03-07 NOTE — Progress Notes (Signed)
START OFF PATHWAY REGIMEN - Non-Small Cell Lung  Off Pathway: Pembrolizumab 200 mg q21 Days  [id number]:Pembrolizumab 200 mg q21 Days:   A cycle is 21 days:     Pembrolizumab [facility])) 200 mg flat dose in 50 mL NS IV over 30 minutes every 21 days.  Inline filter required (low-protein binding) Dose Mod: None Additional Orders: Severe immune-mediated reactions can occur. See prescribing information for more details and required immediate management with steroids. Monitor thyroid, renal, liver function tests, glucose, and sodium at baseline and before each  dose of pembrolizumab. Ref: Keytruda(R) (pembrolizumab) prescribing information, 2016.  **Always confirm dose/schedule in your pharmacy ordering system**    Patient Characteristics: Stage III - Unresectable, PS >= 2 Check here if patient was staged using an edition prior to AJCC Staging - 8th Edition (i.e., prior to March 04, 2016)? false AJCC T Category: T1c Current Disease Status: No Distant Mets or Local Recurrence AJCC N Category: N3 AJCC M Category: M0 AJCC 8 Stage Grouping: IIIB Performance Status: PS >= 2  Intent of Therapy: Non-Curative / Palliative Intent, Discussed with Patient

## 2016-03-08 LAB — PREALBUMIN: PREALBUMIN: 21 mg/dL (ref 9–32)

## 2016-03-09 ENCOUNTER — Encounter: Payer: Self-pay | Admitting: Family Medicine

## 2016-03-09 ENCOUNTER — Other Ambulatory Visit: Payer: Self-pay | Admitting: Family Medicine

## 2016-03-11 ENCOUNTER — Other Ambulatory Visit: Payer: Self-pay | Admitting: Family Medicine

## 2016-03-11 ENCOUNTER — Other Ambulatory Visit: Payer: Self-pay | Admitting: Radiology

## 2016-03-12 ENCOUNTER — Ambulatory Visit (HOSPITAL_COMMUNITY)
Admission: RE | Admit: 2016-03-12 | Discharge: 2016-03-12 | Disposition: A | Discharge status: Home / Self Care | Payer: Medicare Other | Source: Ambulatory Visit | Attending: Hematology & Oncology | Admitting: Hematology & Oncology

## 2016-03-12 ENCOUNTER — Encounter (HOSPITAL_COMMUNITY): Payer: Self-pay

## 2016-03-12 ENCOUNTER — Encounter: Payer: Self-pay | Admitting: *Deleted

## 2016-03-12 ENCOUNTER — Other Ambulatory Visit: Payer: Self-pay | Admitting: Hematology & Oncology

## 2016-03-12 ENCOUNTER — Other Ambulatory Visit: Payer: Medicare Other

## 2016-03-12 DIAGNOSIS — Z923 Personal history of irradiation: Secondary | ICD-10-CM | POA: Diagnosis not present

## 2016-03-12 DIAGNOSIS — Z7982 Long term (current) use of aspirin: Secondary | ICD-10-CM | POA: Insufficient documentation

## 2016-03-12 DIAGNOSIS — Z96642 Presence of left artificial hip joint: Secondary | ICD-10-CM | POA: Diagnosis not present

## 2016-03-12 DIAGNOSIS — M545 Low back pain: Secondary | ICD-10-CM | POA: Insufficient documentation

## 2016-03-12 DIAGNOSIS — I10 Essential (primary) hypertension: Secondary | ICD-10-CM | POA: Insufficient documentation

## 2016-03-12 DIAGNOSIS — Z8673 Personal history of transient ischemic attack (TIA), and cerebral infarction without residual deficits: Secondary | ICD-10-CM | POA: Insufficient documentation

## 2016-03-12 DIAGNOSIS — K219 Gastro-esophageal reflux disease without esophagitis: Secondary | ICD-10-CM | POA: Diagnosis not present

## 2016-03-12 DIAGNOSIS — Z8521 Personal history of malignant neoplasm of larynx: Secondary | ICD-10-CM | POA: Diagnosis not present

## 2016-03-12 DIAGNOSIS — Z86010 Personal history of colonic polyps: Secondary | ICD-10-CM | POA: Insufficient documentation

## 2016-03-12 DIAGNOSIS — K5900 Constipation, unspecified: Secondary | ICD-10-CM | POA: Insufficient documentation

## 2016-03-12 DIAGNOSIS — M109 Gout, unspecified: Secondary | ICD-10-CM | POA: Diagnosis not present

## 2016-03-12 DIAGNOSIS — F419 Anxiety disorder, unspecified: Secondary | ICD-10-CM | POA: Diagnosis not present

## 2016-03-12 DIAGNOSIS — F329 Major depressive disorder, single episode, unspecified: Secondary | ICD-10-CM | POA: Insufficient documentation

## 2016-03-12 DIAGNOSIS — N289 Disorder of kidney and ureter, unspecified: Secondary | ICD-10-CM | POA: Diagnosis not present

## 2016-03-12 DIAGNOSIS — D649 Anemia, unspecified: Secondary | ICD-10-CM | POA: Insufficient documentation

## 2016-03-12 DIAGNOSIS — G8929 Other chronic pain: Secondary | ICD-10-CM | POA: Insufficient documentation

## 2016-03-12 DIAGNOSIS — C3431 Malignant neoplasm of lower lobe, right bronchus or lung: Secondary | ICD-10-CM | POA: Diagnosis not present

## 2016-03-12 DIAGNOSIS — Z452 Encounter for adjustment and management of vascular access device: Secondary | ICD-10-CM | POA: Diagnosis not present

## 2016-03-12 DIAGNOSIS — M1990 Unspecified osteoarthritis, unspecified site: Secondary | ICD-10-CM | POA: Insufficient documentation

## 2016-03-12 DIAGNOSIS — H3530 Unspecified macular degeneration: Secondary | ICD-10-CM | POA: Diagnosis not present

## 2016-03-12 HISTORY — PX: IR GENERIC HISTORICAL: IMG1180011

## 2016-03-12 LAB — CBC WITH DIFFERENTIAL/PLATELET
BASOS ABS: 0.1 10*3/uL (ref 0.0–0.1)
Basophils Relative: 1 %
EOS ABS: 0.1 10*3/uL (ref 0.0–0.7)
EOS PCT: 1 %
HCT: 32.9 % — ABNORMAL LOW (ref 36.0–46.0)
Hemoglobin: 10.8 g/dL — ABNORMAL LOW (ref 12.0–15.0)
LYMPHS PCT: 19 %
Lymphs Abs: 2.3 10*3/uL (ref 0.7–4.0)
MCH: 31.9 pg (ref 26.0–34.0)
MCHC: 32.8 g/dL (ref 30.0–36.0)
MCV: 97.1 fL (ref 78.0–100.0)
Monocytes Absolute: 1.1 10*3/uL — ABNORMAL HIGH (ref 0.1–1.0)
Monocytes Relative: 9 %
NEUTROS PCT: 70 %
Neutro Abs: 8.7 10*3/uL — ABNORMAL HIGH (ref 1.7–7.7)
PLATELETS: 359 10*3/uL (ref 150–400)
RBC: 3.39 MIL/uL — ABNORMAL LOW (ref 3.87–5.11)
RDW: 15.7 % — ABNORMAL HIGH (ref 11.5–15.5)
WBC: 12.4 10*3/uL — AB (ref 4.0–10.5)

## 2016-03-12 LAB — BASIC METABOLIC PANEL
Anion gap: 11 (ref 5–15)
BUN: 29 mg/dL — AB (ref 6–20)
CO2: 24 mmol/L (ref 22–32)
CREATININE: 1.35 mg/dL — AB (ref 0.44–1.00)
Calcium: 9.1 mg/dL (ref 8.9–10.3)
Chloride: 101 mmol/L (ref 101–111)
GFR, EST AFRICAN AMERICAN: 42 mL/min — AB (ref >60)
GFR, EST NON AFRICAN AMERICAN: 36 mL/min — AB (ref >60)
Glucose, Bld: 80 mg/dL (ref 65–99)
POTASSIUM: 4.5 mmol/L (ref 3.5–5.1)
SODIUM: 136 mmol/L (ref 135–145)

## 2016-03-12 LAB — PROTIME-INR
INR: 0.92
PROTHROMBIN TIME: 12.3 s (ref 11.4–15.2)

## 2016-03-12 MED ORDER — LIDOCAINE-EPINEPHRINE (PF) 2 %-1:200000 IJ SOLN
INTRAMUSCULAR | Status: AC
  Filled 2016-03-12: qty 20

## 2016-03-12 MED ORDER — MIDAZOLAM HCL 2 MG/2ML IJ SOLN
INTRAMUSCULAR | Status: AC
  Filled 2016-03-12: qty 2

## 2016-03-12 MED ORDER — MIDAZOLAM HCL 2 MG/2ML IJ SOLN
INTRAMUSCULAR | Status: AC | PRN
  Administered 2016-03-12 (×2): 1 mg via INTRAVENOUS

## 2016-03-12 MED ORDER — SODIUM CHLORIDE 0.9 % IV SOLN
INTRAVENOUS | Status: DC
  Administered 2016-03-12: 12:00:00 via INTRAVENOUS

## 2016-03-12 MED ORDER — FENTANYL CITRATE (PF) 100 MCG/2ML IJ SOLN
INTRAMUSCULAR | Status: AC
Start: 2016-03-12 — End: 2016-03-12
  Filled 2016-03-12: qty 2

## 2016-03-12 MED ORDER — CEFAZOLIN SODIUM-DEXTROSE 2-4 GM/100ML-% IV SOLN
2.0000 g | INTRAVENOUS | Status: AC
  Administered 2016-03-12: 2 g via INTRAVENOUS
  Filled 2016-03-12: qty 100

## 2016-03-12 MED ORDER — LIDOCAINE HCL 1 % IJ SOLN
INTRAMUSCULAR | Status: AC
  Filled 2016-03-12: qty 20

## 2016-03-12 MED ORDER — LIDOCAINE-EPINEPHRINE (PF) 2 %-1:200000 IJ SOLN
INTRAMUSCULAR | Status: AC | PRN
  Administered 2016-03-12: 20 mL

## 2016-03-12 MED ORDER — HEPARIN SOD (PORK) LOCK FLUSH 100 UNIT/ML IV SOLN
INTRAVENOUS | Status: AC
  Administered 2016-03-12: 14:00:00
  Filled 2016-03-12: qty 5

## 2016-03-12 MED ORDER — FENTANYL CITRATE (PF) 100 MCG/2ML IJ SOLN
INTRAMUSCULAR | Status: AC | PRN
  Administered 2016-03-12: 50 ug via INTRAVENOUS
  Administered 2016-03-12 (×2): 25 ug via INTRAVENOUS

## 2016-03-12 NOTE — Sedation Documentation (Signed)
Patient is resting comfortably. 

## 2016-03-12 NOTE — Sedation Documentation (Signed)
medicated

## 2016-03-12 NOTE — Discharge Instructions (Signed)
Moderate Conscious Sedation, Adult, Care After These instructions provide you with information about caring for yourself after your procedure. Your health care provider may also give you more specific instructions. Your treatment has been planned according to current medical practices, but problems sometimes occur. Call your health care provider if you have any problems or questions after your procedure. What can I expect after the procedure? After your procedure, it is common: To feel sleepy for several hours. To feel clumsy and have poor balance for several hours. To have poor judgment for several hours. To vomit if you eat too soon. Follow these instructions at home: For at least 24 hours after the procedure:  Do not: Participate in activities where you could fall or become injured. Drive. Use heavy machinery. Drink alcohol. Take sleeping pills or medicines that cause drowsiness. Make important decisions or sign legal documents. Take care of children on your own. Rest. Eating and drinking Follow the diet recommended by your health care provider. If you vomit: Drink water, juice, or soup when you can drink without vomiting. Make sure you have little or no nausea before eating solid foods. General instructions Have a responsible adult stay with you until you are awake and alert. Take over-the-counter and prescription medicines only as told by your health care provider. If you smoke, do not smoke without supervision. Keep all follow-up visits as told by your health care provider. This is important. Contact a health care provider if: You keep feeling nauseous or you keep vomiting. You feel light-headed. You develop a rash. You have a fever. Get help right away if: You have trouble breathing. This information is not intended to replace advice given to you by your health care provider. Make sure you discuss any questions you have with your health care provider. Document Released:  12/09/2012 Document Revised: 07/24/2015 Document Reviewed: 06/10/2015 Elsevier Interactive Patient Education  2017 [place] An implanted port is a type of central line that is placed under the skin. Central lines are used to provide IV access when treatment or nutrition needs to be given through a person's veins. Implanted ports are used for long-term IV access. An implanted port may be placed because:   You need IV medicine that would be irritating to the small veins in your hands or arms.   You need long-term IV medicines, such as antibiotics.   You need IV nutrition for a long period.   You need frequent blood draws for lab tests.   You need dialysis.  Implanted ports are usually placed in the chest area, but they can also be placed in the upper arm, the abdomen, or the leg. An implanted port has two main parts:   Reservoir. The reservoir is round and will appear as a small, raised area under your skin. The reservoir is the part where a needle is inserted to give medicines or draw blood.   Catheter. The catheter is a thin, flexible tube that extends from the reservoir. The catheter is placed into a large vein. Medicine that is inserted into the reservoir goes into the catheter and then into the vein.  HOW WILL I CARE FOR MY INCISION SITE? Do not get the incision site wet. Bathe or shower as directed by your health care provider.  HOW IS MY PORT ACCESSED? Special steps must be taken to access the port:   Before the port is accessed, a numbing cream can be placed on the skin. This helps numb the  skin over the port site.   Your health care provider uses a sterile technique to access the port.  Your health care provider must put on a mask and sterile gloves.  The skin over your port is cleaned carefully with an antiseptic and allowed to dry.  The port is gently pinched between sterile gloves, and a needle is inserted into the port.  Only  "non-coring" port needles should be used to access the port. Once the port is accessed, a blood return should be checked. This helps ensure that the port is in the vein and is not clogged.   If your port needs to remain accessed for a constant infusion, a clear (transparent) bandage will be placed over the needle site. The bandage and needle will need to be changed every week, or as directed by your health care provider.   Keep the bandage covering the needle clean and dry. Do not get it wet. Follow your health care provider's instructions on how to take a shower or bath while the port is accessed.   If your port does not need to stay accessed, no bandage is needed over the port.  WHAT IS FLUSHING? Flushing helps keep the port from getting clogged. Follow your health care provider's instructions on how and when to flush the port. Ports are usually flushed with saline solution or a medicine called heparin. The need for flushing will depend on how the port is used.   If the port is used for intermittent medicines or blood draws, the port will need to be flushed:   After medicines have been given.   After blood has been drawn.   As part of routine maintenance.   If a constant infusion is running, the port may not need to be flushed.  HOW LONG WILL MY PORT STAY IMPLANTED? The port can stay in for as long as your health care provider thinks it is needed. When it is time for the port to come out, surgery will be done to remove it. The procedure is similar to the one performed when the port was put in.  WHEN SHOULD I SEEK IMMEDIATE MEDICAL CARE? When you have an implanted port, you should seek immediate medical care if:   You notice a bad smell coming from the incision site.   You have swelling, redness, or drainage at the incision site.   You have more swelling or pain at the port site or the surrounding area.   You have a fever that is not controlled with medicine. This  information is not intended to replace advice given to you by your health care provider. Make sure you discuss any questions you have with your health care provider. Document Released: 02/18/2005 Document Revised: 12/09/2012 Document Reviewed: 10/26/2012 Elsevier Interactive Patient Education  2017 [place] Insertion, Care After Refer to this sheet in the next few weeks. These instructions provide you with information on caring for yourself after your procedure. Your health care provider may also give you more specific instructions. Your treatment has been planned according to current medical practices, but problems sometimes occur. Call your health care provider if you have any problems or questions after your procedure. WHAT TO EXPECT AFTER THE PROCEDURE After your procedure, it is typical to have the following:   Discomfort at the port insertion site. Ice packs to the area will help.  Bruising on the skin over the port. This will subside in 3-4 days. HOME CARE INSTRUCTIONS  After your  port is placed, you will get a manufacturer's information card. The card has information about your port. Keep this card with you at all times.   Know what kind of port you have. There are many types of ports available.   Wear a medical alert bracelet in case of an emergency. This can help alert health care workers that you have a port.   The port can stay in for as long as your health care provider believes it is necessary.   A home health care nurse may give medicines and take care of the port.   You or a family member can get special training and directions for giving medicine and taking care of the port at home.  SEEK MEDICAL CARE IF:   Your port does not flush or you are unable to get a blood return.   You have a fever or chills. SEEK IMMEDIATE MEDICAL CARE IF:  You have new fluid or pus coming from your incision.   You notice a bad smell coming from your incision  site.   You have swelling, pain, or more redness at the incision or port site.   You have chest pain or shortness of breath. This information is not intended to replace advice given to you by your health care provider. Make sure you discuss any questions you have with your health care provider. Document Released: 12/09/2012 Document Revised: 02/23/2013 Document Reviewed: 12/09/2012 Elsevier Interactive Patient Education  2017 [employer].

## 2016-03-12 NOTE — H&P (Signed)
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 She is not a candidate for chemotherapy, radiation therapy or surgery. She presents today for Port-A-Cath placement for anticipated immunotherapy.She currently denies fever, headache, vomiting,abdominal pain or abnormal bleeding. She has had weight loss, anorexia, constipation, back and chest discomfort, occasional cough as well as intermittent nausea. Past Medical History:  Diagnosis Date  . Age-related macular degeneration, dry, right eye   . Age-related macular degeneration, wet, left eye ([place])   . Anemia   . Anxiety   . Arthritis    "thumbs, hands" (08/18/2014)  . CAP (community acquired pneumonia) 08/19/2014  . Carotid artery disease ([place])   . Chicken pox   . Chronic lower back pain   . Colon polyp   . Depression   . Diverticulitis   . GERD (gastroesophageal reflux disease)   . Goals of care, counseling/discussion 03/07/2016  . History of blood transfusion 1976; 2013   "w/hysterectomy; hip OR"  . History of gout   . Hypertension   . Kidney disease   . 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 TIA (transient ischemic attack) ~ 2009 X 2  . Vocal cord cancer ([place]) ~ 2009   S/P radiation   Past Surgical History:  Procedure Laterality Date  . ABDOMINAL HYSTERECTOMY  1976  . APPENDECTOMY  1954  . CAROTID ENDARTERECTOMY Left ~ 2014  . CATARACT EXTRACTION W/ INTRAOCULAR LENS  IMPLANT, BILATERAL Bilateral ~ 2009  . COLONOSCOPY N/A 01/31/2014   Procedure: COLONOSCOPY;  Surgeon: Ladene Artist, MD;  Location: [facility] ENDOSCOPY;  Service: Endoscopy;  Laterality: N/A;  . DILATION AND CURETTAGE OF UTERUS      . JOINT REPLACEMENT    . MOUTH FLOOR MASS EXCISION  03/2014   "removed cancer underneath my tongue"  . TONSILLECTOMY  1941  . TOTAL HIP ARTHROPLASTY Left 2013     Allergies: Morphine and related and Codeine  Medications: Prior to Admission medications   Medication Sig Start Date End Date Taking? Authorizing Provider  acetaminophen (TYLENOL) 650 MG CR tablet Take 1,300 mg by mouth at bedtime.    Historical Provider, MD  allopurinol (ZYLOPRIM) 100 MG tablet TAKE 1 TABLET BY MOUTH AT BEDTIME 01/12/16   Alferd Apa Lowne Chase, DO  ALPRAZolam Duanne Moron) 0.25 MG tablet Take 1 tablet (0.25 mg total) by mouth as needed for anxiety. 11/21/15   Rosalita Chessman Chase, DO  aspirin 81 MG tablet Take 81 mg by mouth daily.    Historical Provider, MD  diphenhydrAMINE (SIMPLY SLEEP) 25 MG tablet Take 25 mg by mouth at bedtime as needed for sleep.    Historical Provider, MD  docusate sodium (COLACE) 100 MG capsule Take 100 mg by mouth every morning.    Historical Provider, MD  megestrol (MEGACE) 40 MG/ML suspension Take 10 mLs (400 mg total) by mouth daily. 03/07/16   Volanda Napoleon, MD  metoprolol succinate (TOPROL-XL) 25 MG 24 hr tablet TAKE 1 TABLET(25 MG) BY MOUTH EVERY MORNING 12/12/15   Mosie Lukes, MD  mirtazapine (REMERON SOL-TAB) 15 MG disintegrating tablet DISSOLVE 1 TABLET ON THE TONGUE AT BEDTIME 12/18/15   Ann Held, DO  Multiple Vitamins-Minerals (HM MULTIVITAMIN ADULT GUMMY) CHEW Chew 2 tablets by mouth every morning.    Historical Provider, MD  Naproxen Sodium 220 MG CAPS Take by mouth.    Historical Provider, MD  omeprazole (PRILOSEC) 20 MG capsule TAKE ONE CAPSULE BY MOUTH TWICE DAILY BEFORE A MEAL 08/25/15   Yvonne R Lowne Chase, DO  ondansetron (ZOFRAN-ODT) 8 MG disintegrating tablet Take 1 tablet (8 mg total) by mouth every 8 (eight) hours as needed for nausea or vomiting. Reported on 05/17/2015 03/05/16   Alferd Apa Lowne Chase, DO  PARoxetine (PAXIL) 10 MG tablet TAKE 1 TABLET BY  MOUTH DAILY FOR DEPRESSION 12/12/15   Mosie Lukes, MD  [name] 0.625 MG tablet TAKE 1 TABLET BY MOUTH EVERY DAY FOR 21 DAYS, THEN DO NOT TAKE FOR 7 DAYS 03/12/16   Rosalita Chessman Chase, DO  traMADol (ULTRAM) 50 MG tablet Take 1 tablet (50 mg total) by mouth every 6 (six) hours as needed. 02/08/16   Volanda Napoleon, MD  traZODone (DESYREL) 50 MG tablet TAKE 1 TABLET(50 MG) BY MOUTH AT BEDTIME 12/21/15   Rosalita Chessman Chase, DO     Vital Signs: BP (!) 150/88 (BP Location: Right Arm)   Pulse (!) 109   Temp 97.7 F (36.5 C) (Oral)   SpO2 97%   Physical Exam Cachectic-appearing white female in no acute distress. Chest with distant breath sounds bilaterally. Heart with slightly tachycardic rate, occasional ectopy noted; abdomen soft, positive bowel sounds, nontender; lower extremities no edema  Imaging: No results found.  Labs:  CBC:  Recent Labs  12/07/15 1127 12/18/15 1354 02/01/16 0630 03/07/16 1055  WBC 10.3 14.9* 10.2 12.5*  HGB 10.9* 11.1* 11.5* 10.3*  HCT 33.9* 34.4* 35.5* 32.2*  PLT 441* 438.0* 345 370    COAGS:  Recent Labs  02/01/16 0630  INR 0.87    BMP:  Recent Labs  12/07/15 1127 12/18/15 1354 12/21/15 1020 02/01/16 0630 03/07/16 1055  NA 131* 133* 130* 133* 136  K 3.6 5.4* 3.8 3.5 4.1  CL 96* 94* 91* 95*  --   CO2 24 32 '31 28 26  '$ GLUCOSE 101* 107* 86 96 114  BUN '19 21 23 15 '$ 28.7*  CALCIUM 8.6* 9.6 9.4 9.1 9.5  CREATININE 1.26* 1.56* 1.31* 1.35* 1.3*  GFRNONAA 39*  --   --  36*  --   GFRAA 46*  --   --  42*  --     LIVER FUNCTION TESTS:  Recent Labs  05/18/15 1143 11/21/15 1138 12/18/15 1354 03/07/16 1055  BILITOT 0.3 0.2 0.2 0.26  AST '20 19 15 17  '$ ALT '12 10 7 9  '$ ALKPHOS 95 82 60 83  PROT 7.5 7.0 6.8 6.5  ALBUMIN 3.9 3.6 3.5 3.3*    Assessment and Plan: Pt with known history of 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 She is not a candidate for chemotherapy, radiation therapy or surgery. She  presents today for Port-A-Cath placement for anticipated immunotherapy.Risks and benefits discussed with the patient/daughter including, but not limited to bleeding, infection, pneumothorax, or fibrin sheath development and need for additional procedures.All of the patient's questions were answered, patient is agreeable to proceed.Consent signed and in chart. Labs pending.      Electronically Signed: D. Rowe Robert 03/12/2016, 11:44 AM   I spent a total of 20 minutes at the the patient's bedside AND on the patient's hospital floor or unit, greater than 50% of which was counseling/coordinating care  for Port-A-Cath placement

## 2016-03-12 NOTE — Procedures (Signed)
Interventional Radiology Procedure Note  Procedure: Placement of a right IJ approach single lumen PowerPort.  Tip is positioned at the superior cavoatrial junction and catheter is ready for immediate use.  Complications: No immediate Recommendations:  - Ok to shower tomorrow - Do not submerge for 7 days - Routine line care   Signed,  Lashaundra Lehrmann K. Mong Neal, MD   

## 2016-03-12 NOTE — Discharge Instructions (Signed)
Implanted Port Insertion, Care After Refer to this sheet in the next few weeks. These instructions provide you with information on caring for yourself after your procedure. Your health care provider may also give you more specific instructions. Your treatment has been planned according to current medical practices, but problems sometimes occur. Call your health care provider if you have any problems or questions after your procedure. WHAT TO EXPECT AFTER THE PROCEDURE After your procedure, it is typical to have the following:   Discomfort at the port insertion site. Ice packs to the area will help.  Bruising on the skin over the port. This will subside in 3-4 days. HOME CARE INSTRUCTIONS  After your port is placed, you will get a manufacturer's information card. The card has information about your port. Keep this card with you at all times.   Know what kind of port you have. There are many types of ports available.   Wear a medical alert bracelet in case of an emergency. This can help alert health care workers that you have a port.   The port can stay in for as long as your health care provider believes it is necessary.   A home health care nurse may give medicines and take care of the port.   You or a family member can get special training and directions for giving medicine and taking care of the port at home.  SEEK MEDICAL CARE IF:   Your port does not flush or you are unable to get a blood return.   You have a fever or chills. SEEK IMMEDIATE MEDICAL CARE IF:  You have new fluid or pus coming from your incision.   You notice a bad smell coming from your incision site.   You have swelling, pain, or more redness at the incision or port site.   You have chest pain or shortness of breath. This information is not intended to replace advice given to you by your health care provider. Make sure you discuss any questions you have with your health care provider. Document  Released: 12/09/2012 Document Revised: 02/23/2013 Document Reviewed: 12/09/2012 Elsevier Interactive Patient Education  2017 [place]. Moderate Conscious Sedation, Adult, Care After These instructions provide you with information about caring for yourself after your procedure. Your health care provider may also give you more specific instructions. Your treatment has been planned according to current medical practices, but problems sometimes occur. Call your health care provider if you have any problems or questions after your procedure. What can I expect after the procedure? After your procedure, it is common:  To feel sleepy for several hours.  To feel clumsy and have poor balance for several hours.  To have poor judgment for several hours.  To vomit if you eat too soon. Follow these instructions at home: For at least 24 hours after the procedure:   Do not:  Participate in activities where you could fall or become injured.  Drive.  Use heavy machinery.  Drink alcohol.  Take sleeping pills or medicines that cause drowsiness.  Make important decisions or sign legal documents.  Take care of children on your own.  Rest. Eating and drinking  Follow the diet recommended by your health care provider.  If you vomit:  Drink water, juice, or soup when you can drink without vomiting.  Make sure you have little or no nausea before eating solid foods. General instructions  Have a responsible adult stay with you until you are awake and alert.  Take over-the-counter and prescription medicines only as told by your health care provider.  If you smoke, do not smoke without supervision.  Keep all follow-up visits as told by your health care provider. This is important. Contact a health care provider if:  You keep feeling nauseous or you keep vomiting.  You feel light-headed.  You develop a rash.  You have a fever. Get help right away if:  You have trouble  breathing. This information is not intended to replace advice given to you by your health care provider. Make sure you discuss any questions you have with your health care provider. Document Released: 12/09/2012 Document Revised: 07/24/2015 Document Reviewed: 06/10/2015 Elsevier Interactive Patient Education  2017 [employer].

## 2016-03-12 NOTE — Telephone Encounter (Signed)
Rx sent to pharmacy. LB 

## 2016-03-12 NOTE — Sedation Documentation (Signed)
C/o a little pain remedicated

## 2016-03-13 ENCOUNTER — Other Ambulatory Visit: Payer: Self-pay | Admitting: *Deleted

## 2016-03-13 ENCOUNTER — Other Ambulatory Visit: Payer: Self-pay | Admitting: Hematology & Oncology

## 2016-03-13 DIAGNOSIS — C3431 Malignant neoplasm of lower lobe, right bronchus or lung: Secondary | ICD-10-CM

## 2016-03-13 DIAGNOSIS — R918 Other nonspecific abnormal finding of lung field: Secondary | ICD-10-CM

## 2016-03-13 MED ORDER — LIDOCAINE-PRILOCAINE 2.5-2.5 % EX CREA: TOPICAL_CREAM | CUTANEOUS | 3 refills | Status: DC

## 2016-03-13 MED ORDER — ONDANSETRON HCL 8 MG PO TABS: 8.0000 mg | ORAL_TABLET | Freq: Two times a day (BID) | ORAL | 1 refills | Status: DC | PRN

## 2016-03-13 MED ORDER — PROCHLORPERAZINE MALEATE 10 MG PO TABS: 10.0000 mg | ORAL_TABLET | Freq: Four times a day (QID) | ORAL | 1 refills | Status: DC | PRN

## 2016-03-13 MED ORDER — LORAZEPAM 0.5 MG PO TABS: 0.5000 mg | ORAL_TABLET | Freq: Four times a day (QID) | ORAL | 0 refills | Status: DC | PRN

## 2016-03-14 ENCOUNTER — Other Ambulatory Visit (HOSPITAL_BASED_OUTPATIENT_CLINIC_OR_DEPARTMENT_OTHER): Payer: Medicare Other

## 2016-03-14 ENCOUNTER — Encounter: Payer: Self-pay | Admitting: Hematology & Oncology

## 2016-03-14 ENCOUNTER — Ambulatory Visit: Payer: Medicare Other

## 2016-03-14 ENCOUNTER — Ambulatory Visit (HOSPITAL_BASED_OUTPATIENT_CLINIC_OR_DEPARTMENT_OTHER): Payer: Medicare Other

## 2016-03-14 DIAGNOSIS — C3431 Malignant neoplasm of lower lobe, right bronchus or lung: Secondary | ICD-10-CM

## 2016-03-14 DIAGNOSIS — Z5112 Encounter for antineoplastic immunotherapy: Secondary | ICD-10-CM

## 2016-03-14 DIAGNOSIS — R918 Other nonspecific abnormal finding of lung field: Secondary | ICD-10-CM

## 2016-03-14 LAB — CBC WITH DIFFERENTIAL (CANCER CENTER ONLY)
BASO#: 0.1 10*3/uL (ref 0.0–0.2)
BASO%: 0.6 % (ref 0.0–2.0)
EOS%: 2.1 % (ref 0.0–7.0)
Eosinophils Absolute: 0.3 10*3/uL (ref 0.0–0.5)
HCT: 31.3 % — ABNORMAL LOW (ref 34.8–46.6)
HEMOGLOBIN: 10 g/dL — AB (ref 11.6–15.9)
LYMPH#: 1.2 10*3/uL (ref 0.9–3.3)
LYMPH%: 9.8 % — ABNORMAL LOW (ref 14.0–48.0)
MCH: 32.2 pg (ref 26.0–34.0)
MCHC: 31.9 g/dL — ABNORMAL LOW (ref 32.0–36.0)
MCV: 101 fL (ref 81–101)
MONO#: 1 10*3/uL — ABNORMAL HIGH (ref 0.1–0.9)
MONO%: 7.7 % (ref 0.0–13.0)
NEUT%: 79.8 % (ref 39.6–80.0)
NEUTROS ABS: 9.9 10*3/uL — AB (ref 1.5–6.5)
Platelets: 337 10*3/uL (ref 145–400)
RBC: 3.11 10*6/uL — AB (ref 3.70–5.32)
RDW: 14.9 % (ref 11.1–15.7)
WBC: 12.4 10*3/uL — AB (ref 3.9–10.0)

## 2016-03-14 LAB — CMP (CANCER CENTER ONLY)
ALT(SGPT): 14 U/L (ref 10–47)
AST: 23 U/L (ref 11–38)
Albumin: 2.9 g/dL — ABNORMAL LOW (ref 3.3–5.5)
Alkaline Phosphatase: 75 U/L (ref 26–84)
BILIRUBIN TOTAL: 0.5 mg/dL (ref 0.20–1.60)
BUN: 27 mg/dL — AB (ref 7–22)
CO2: 25 meq/L (ref 18–33)
CREATININE: 1.3 mg/dL — AB (ref 0.6–1.2)
Calcium: 8.6 mg/dL (ref 8.0–10.3)
Chloride: 103 mEq/L (ref 98–108)
Glucose, Bld: 156 mg/dL — ABNORMAL HIGH (ref 73–118)
Potassium: 3.8 mEq/L (ref 3.3–4.7)
SODIUM: 137 meq/L (ref 128–145)
TOTAL PROTEIN: 6.3 g/dL — AB (ref 6.4–8.1)

## 2016-03-14 MED ORDER — SODIUM CHLORIDE 0.9 % IV SOLN
Freq: Once | INTRAVENOUS | Status: AC
  Administered 2016-03-14: 11:00:00 via INTRAVENOUS

## 2016-03-14 MED ORDER — SODIUM CHLORIDE 0.9% FLUSH
10.0000 mL | INTRAVENOUS | Status: DC | PRN
  Administered 2016-03-14: 10 mL
  Filled 2016-03-14: qty 10

## 2016-03-14 MED ORDER — HEPARIN SOD (PORK) LOCK FLUSH 100 UNIT/ML IV SOLN
500.0000 [IU] | Freq: Once | INTRAVENOUS | Status: AC | PRN
  Administered 2016-03-14: 500 [IU]
  Filled 2016-03-14: qty 5

## 2016-03-14 MED ORDER — SODIUM CHLORIDE 0.9 % IV SOLN
200.0000 mg | Freq: Once | INTRAVENOUS | Status: AC
  Administered 2016-03-14: 200 mg via INTRAVENOUS
  Filled 2016-03-14: qty 8

## 2016-03-14 NOTE — Patient Instructions (Signed)
[facility] Discharge Instructions for Patients Receiving Chemotherapy  Today you received the following chemotherapy agents Keytruda  To help prevent nausea and vomiting after your treatment, we encourage you to take your nausea medication as prescribed    If you develop nausea and vomiting that is not controlled by your nausea medication, call the clinic. If it is after clinic hours your family physician or the after hours number for the clinic or go to the Emergency Department.   BELOW ARE SYMPTOMS THAT SHOULD BE REPORTED IMMEDIATELY:  *FEVER GREATER THAN 100.5 F  *CHILLS WITH OR WITHOUT FEVER  NAUSEA AND VOMITING THAT IS NOT CONTROLLED WITH YOUR NAUSEA MEDICATION  *UNUSUAL SHORTNESS OF BREATH  *UNUSUAL BRUISING OR BLEEDING  TENDERNESS IN MOUTH AND THROAT WITH OR WITHOUT PRESENCE OF ULCERS  *URINARY PROBLEMS  *BOWEL PROBLEMS  UNUSUAL RASH Items with * indicate a potential emergency and should be followed up as soon as possible.  One of the nurses will contact you 24 hours after your treatment. Please let the nurse know about any problems that you may have experienced. Feel free to call the clinic you have any questions or concerns. The clinic phone number is [phone].   I have been informed and understand all the instructions given to me. I know to contact the clinic, my physician, or go to the Emergency Department if any problems should occur. I do not have any questions at this time, but understand that I may call the clinic during office hours   should I have any questions or need assistance in obtaining follow up care.    __________________________________________  _____________  __________ Signature of Patient or Authorized Representative            Date                   Time    __________________________________________ Nurse's Signature

## 2016-03-15 ENCOUNTER — Telehealth: Payer: Self-pay

## 2016-03-15 ENCOUNTER — Other Ambulatory Visit: Payer: Self-pay | Admitting: Family Medicine

## 2016-03-15 NOTE — Telephone Encounter (Signed)
Spoke with pt's daughter Marzetta Board re: Marissa Cruz follow up. Per Marzetta Board, pt denies n/v, alterations in bowel function, fatigue beyond baseline, rash or other complaints. Aware how to reach on call MD over the weekend should problems arise. dph

## 2016-03-29 ENCOUNTER — Other Ambulatory Visit: Payer: Self-pay | Admitting: Family Medicine

## 2016-03-29 DIAGNOSIS — F411 Generalized anxiety disorder: Secondary | ICD-10-CM

## 2016-04-01 NOTE — Telephone Encounter (Signed)
Pharmacy requesting alprazolam  Last filled 03/13/16 #30 by Dr. Marin Olp Last ov 03/05/16  Needs to sign contract and do UDS

## 2016-04-02 NOTE — Telephone Encounter (Signed)
Faxed hardcopy for Alprazolam to [facility] [address] [place]

## 2016-04-03 ENCOUNTER — Other Ambulatory Visit: Payer: Self-pay | Admitting: Family Medicine

## 2016-04-03 DIAGNOSIS — F411 Generalized anxiety disorder: Secondary | ICD-10-CM

## 2016-04-04 ENCOUNTER — Ambulatory Visit (HOSPITAL_BASED_OUTPATIENT_CLINIC_OR_DEPARTMENT_OTHER): Payer: Medicare Other | Admitting: Hematology & Oncology

## 2016-04-04 ENCOUNTER — Other Ambulatory Visit (HOSPITAL_BASED_OUTPATIENT_CLINIC_OR_DEPARTMENT_OTHER): Payer: Medicare Other

## 2016-04-04 ENCOUNTER — Ambulatory Visit (HOSPITAL_BASED_OUTPATIENT_CLINIC_OR_DEPARTMENT_OTHER): Payer: Medicare Other

## 2016-04-04 ENCOUNTER — Ambulatory Visit: Payer: Medicare Other

## 2016-04-04 VITALS — BP 95/47 | HR 104 | Temp 97.5°F

## 2016-04-04 DIAGNOSIS — D649 Anemia, unspecified: Secondary | ICD-10-CM

## 2016-04-04 DIAGNOSIS — C3431 Malignant neoplasm of lower lobe, right bronchus or lung: Secondary | ICD-10-CM

## 2016-04-04 DIAGNOSIS — R918 Other nonspecific abnormal finding of lung field: Secondary | ICD-10-CM

## 2016-04-04 DIAGNOSIS — Z5112 Encounter for antineoplastic immunotherapy: Secondary | ICD-10-CM | POA: Diagnosis not present

## 2016-04-04 DIAGNOSIS — D500 Iron deficiency anemia secondary to blood loss (chronic): Secondary | ICD-10-CM

## 2016-04-04 LAB — CMP (CANCER CENTER ONLY)
ALT(SGPT): 13 U/L (ref 10–47)
AST: 21 U/L (ref 11–38)
Albumin: 3 g/dL — ABNORMAL LOW (ref 3.3–5.5)
Alkaline Phosphatase: 63 U/L (ref 26–84)
BILIRUBIN TOTAL: 0.4 mg/dL (ref 0.20–1.60)
BUN, Bld: 28 mg/dL — ABNORMAL HIGH (ref 7–22)
CALCIUM: 9.5 mg/dL (ref 8.0–10.3)
CO2: 26 meq/L (ref 18–33)
Chloride: 100 mEq/L (ref 98–108)
Creat: 1.5 mg/dl — ABNORMAL HIGH (ref 0.6–1.2)
Glucose, Bld: 151 mg/dL — ABNORMAL HIGH (ref 73–118)
Potassium: 3.9 mEq/L (ref 3.3–4.7)
SODIUM: 139 meq/L (ref 128–145)
Total Protein: 6.5 g/dL (ref 6.4–8.1)

## 2016-04-04 LAB — CBC WITH DIFFERENTIAL (CANCER CENTER ONLY)
BASO#: 0.1 10*3/uL (ref 0.0–0.2)
BASO%: 1.3 % (ref 0.0–2.0)
EOS%: 2.3 % (ref 0.0–7.0)
Eosinophils Absolute: 0.3 10*3/uL (ref 0.0–0.5)
HEMATOCRIT: 26.6 % — AB (ref 34.8–46.6)
HGB: 8.6 g/dL — ABNORMAL LOW (ref 11.6–15.9)
LYMPH#: 1.4 10*3/uL (ref 0.9–3.3)
LYMPH%: 13 % — ABNORMAL LOW (ref 14.0–48.0)
MCH: 30.7 pg (ref 26.0–34.0)
MCHC: 32.3 g/dL (ref 32.0–36.0)
MCV: 95 fL (ref 81–101)
MONO#: 1.1 10*3/uL — ABNORMAL HIGH (ref 0.1–0.9)
MONO%: 9.9 % (ref 0.0–13.0)
NEUT#: 8.1 10*3/uL — ABNORMAL HIGH (ref 1.5–6.5)
NEUT%: 73.5 % (ref 39.6–80.0)
Platelets: 657 10*3/uL — ABNORMAL HIGH (ref 145–400)
RBC: 2.8 10*6/uL — ABNORMAL LOW (ref 3.70–5.32)
RDW: 13.8 % (ref 11.1–15.7)
WBC: 11 10*3/uL — ABNORMAL HIGH (ref 3.9–10.0)

## 2016-04-04 MED ORDER — SODIUM CHLORIDE 0.9% FLUSH
10.0000 mL | INTRAVENOUS | Status: DC | PRN
  Administered 2016-04-04: 10 mL
  Filled 2016-04-04: qty 10

## 2016-04-04 MED ORDER — SODIUM CHLORIDE 0.9 % IV SOLN
200.0000 mg | Freq: Once | INTRAVENOUS | Status: AC
  Administered 2016-04-04: 200 mg via INTRAVENOUS
  Filled 2016-04-04: qty 8

## 2016-04-04 MED ORDER — HEPARIN SOD (PORK) LOCK FLUSH 100 UNIT/ML IV SOLN
500.0000 [IU] | Freq: Once | INTRAVENOUS | Status: AC | PRN
  Administered 2016-04-04: 500 [IU]
  Filled 2016-04-04: qty 5

## 2016-04-04 MED ORDER — SODIUM CHLORIDE 0.9 % IV SOLN
Freq: Once | INTRAVENOUS | Status: AC
  Administered 2016-04-04: 12:00:00 via INTRAVENOUS

## 2016-04-04 NOTE — Patient Instructions (Signed)
[facility] Discharge Instructions for Patients Receiving Chemotherapy  Today you received the following chemotherapy agents Keytruda  To help prevent nausea and vomiting after your treatment, we encourage you to take your nausea medication as prescribed    If you develop nausea and vomiting that is not controlled by your nausea medication, call the clinic. If it is after clinic hours your family physician or the after hours number for the clinic or go to the Emergency Department.   BELOW ARE SYMPTOMS THAT SHOULD BE REPORTED IMMEDIATELY:  *FEVER GREATER THAN 100.5 F  *CHILLS WITH OR WITHOUT FEVER  NAUSEA AND VOMITING THAT IS NOT CONTROLLED WITH YOUR NAUSEA MEDICATION  *UNUSUAL SHORTNESS OF BREATH  *UNUSUAL BRUISING OR BLEEDING  TENDERNESS IN MOUTH AND THROAT WITH OR WITHOUT PRESENCE OF ULCERS  *URINARY PROBLEMS  *BOWEL PROBLEMS  UNUSUAL RASH Items with * indicate a potential emergency and should be followed up as soon as possible.  One of the nurses will contact you 24 hours after your treatment. Please let the nurse know about any problems that you may have experienced. Feel free to call the clinic you have any questions or concerns. The clinic phone number is [phone].   I have been informed and understand all the instructions given to me. I know to contact the clinic, my physician, or go to the Emergency Department if any problems should occur. I do not have any questions at this time, but understand that I may call the clinic during office hours   should I have any questions or need assistance in obtaining follow up care.    __________________________________________  _____________  __________ Signature of Patient or Authorized Representative            Date                   Time    __________________________________________ Nurse's Signature

## 2016-04-04 NOTE — Progress Notes (Signed)
Hematology and Oncology Follow Up Visit  Shelitha Magley [id number] 1934/11/20 82 y.o. 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 Thier is back for follow-up.  She is now on Keytruda so far, she is done well with this. She has had problems with nausea or vomiting. She has had some diarrhea mixed with constipation. She is on various medications for this.  She has not noted any bleeding. She's had no fever. She's had no rashes.  There's been no headache. She's had no mouth sores. She's had no dysphagia or odynophagia.  She's had some slight leg swelling.  Her appetite might be a little bit better. She still has decreased appetite. We do have her on some Megace to try to help this.  Overall, her performance status is ECOG 2.  Medications:  Current Outpatient Prescriptions:  .  acetaminophen (TYLENOL) 650 MG CR tablet, Take 1,300 mg by mouth at bedtime., Disp: , Rfl:  .  allopurinol (ZYLOPRIM) 100 MG tablet, TAKE 1 TABLET BY MOUTH AT BEDTIME, Disp: 90 tablet, Rfl: 1 .  ALPRAZolam (XANAX) 0.25 MG tablet, TAKE 1 TABLET BY MOUTH AS NEEDED FOR ANXIETY, Disp: 30 tablet, Rfl: 0 .  aspirin 81 MG tablet, Take 81 mg by mouth daily., Disp: , Rfl:  .  diphenhydrAMINE (SIMPLY SLEEP) 25 MG tablet, Take 25 mg by mouth at bedtime as needed for sleep., Disp: , Rfl:  .  docusate sodium (COLACE) 100 MG capsule, Take 100 mg by mouth every morning., Disp: , Rfl:  .  lidocaine-prilocaine (EMLA) cream, Apply to affected area once, Disp: 30 g, Rfl: 3 .  LORazepam (ATIVAN) 0.5 MG tablet, Take 1 tablet (0.5 mg total) by mouth every 6 (six) hours as needed (Nausea or vomiting)., Disp: 30 tablet, Rfl: 0 .  megestrol (MEGACE) 40 MG/ML suspension, Take 10 mLs (400 mg total) by mouth daily., Disp: 480 mL, Rfl: 2 .  metoprolol  succinate (TOPROL-XL) 25 MG 24 hr tablet, TAKE 1 TABLET(25 MG) BY MOUTH EVERY MORNING, Disp: 90 tablet, Rfl: 0 .  mirtazapine (REMERON SOL-TAB) 15 MG disintegrating tablet, DISSOLVE 1 TABLET ON THE TONGUE AT BEDTIME, Disp: 90 tablet, Rfl: 0 .  Multiple Vitamins-Minerals (HM MULTIVITAMIN ADULT GUMMY) CHEW, Chew 2 tablets by mouth every morning., Disp: , Rfl:  .  Naproxen Sodium 220 MG CAPS, Take by mouth., Disp: , Rfl:  .  omeprazole (PRILOSEC) 20 MG capsule, TAKE ONE CAPSULE BY MOUTH TWICE DAILY BEFORE A MEAL, Disp: 60 capsule, Rfl: 11 .  ondansetron (ZOFRAN) 8 MG tablet, Take 1 tablet (8 mg total) by mouth 2 (two) times daily as needed (Nausea or vomiting)., Disp: 30 tablet, Rfl: 1 .  ondansetron (ZOFRAN-ODT) 8 MG disintegrating tablet, Take 1 tablet (8 mg total) by mouth every 8 (eight) hours as needed for nausea or vomiting. Reported on 05/17/2015, Disp: 90 tablet, Rfl: 5 .  PARoxetine (PAXIL) 10 MG tablet, TAKE 1 TABLET BY MOUTH DAILY FOR DEPRESSION, Disp: 90 tablet, Rfl: 0 .  [name] 0.625 MG tablet, TAKE 1 TABLET BY MOUTH EVERY DAY FOR 21 DAYS, THEN DO NOT TAKE FOR 7 DAYS, Disp: 90 tablet, Rfl: 0 .  prochlorperazine (COMPAZINE) 10 MG tablet, TAKE 1 TABLET(10 MG) BY MOUTH EVERY 6 HOURS AS NEEDED FOR NAUSEA OR VOMITING, Disp: 385 tablet, Rfl: 1 .  traMADol (ULTRAM)  50 MG tablet, Take 1 tablet (50 mg total) by mouth every 6 (six) hours as needed., Disp: 90 tablet, Rfl: 0 .  traZODone (DESYREL) 50 MG tablet, TAKE 1 TABLET(50 MG) BY MOUTH AT BEDTIME, Disp: 90 tablet, Rfl: 0  Allergies:  Allergies  Allergen Reactions  . Morphine And Related Other (See Comments)    Hallucinations and combative  . Codeine Nausea Only    Past Medical History, Surgical history, Social history, and Family History were reviewed and updated.  Review of Systems: As above  Physical Exam:  oral temperature is 97.5 F (36.4 C). Her blood pressure is 95/47 (abnormal) and her pulse is 104 (abnormal). Her oxygen  saturation is 98%.   Wt Readings from Last 3 Encounters:  03/12/16 89 lb (40.4 kg)  03/07/16 89 lb 1.9 oz (40.4 kg)  03/05/16 89 lb 12.8 oz (40.7 kg)     Thin white female in no obvious distress. Head and neck exam shows no ocular or oral lesions. She has no palpable cervical or supraclavicular lymph nodes. Lungs are clear. There may be some slight decrease over on the right side. Cardiac exam regular rate and rhythm with no murmurs, rubs or bruits. Abdomen is soft. She has good bowel sounds. There is no fluid wave. There is no palpable liver or spleen tip. Back exam shows no tenderness over the spine, ribs or hips. Externally shows no clubbing, cyanosis or edema. Neurological exam shows no focal neurological deficits. Skin exam shows no rashes, ecchymoses or petechia.  Lab Results  Component Value Date   WBC 11.0 (H) 04/04/2016   HGB 8.6 (L) 04/04/2016   HCT 26.6 (L) 04/04/2016   MCV 95 04/04/2016   PLT 657 (H) 04/04/2016     Chemistry      Component Value Date/Time   NA 139 04/04/2016 1030   NA 136 03/07/2016 1055   K 3.9 04/04/2016 1030   K 4.1 03/07/2016 1055   CL 100 04/04/2016 1030   CO2 26 04/04/2016 1030   CO2 26 03/07/2016 1055   BUN 28 (H) 04/04/2016 1030   BUN 28.7 (H) 03/07/2016 1055   CREATININE 1.5 (H) 04/04/2016 1030   CREATININE 1.3 (H) 03/07/2016 1055      Component Value Date/Time   CALCIUM 9.5 04/04/2016 1030   CALCIUM 9.5 03/07/2016 1055   ALKPHOS 63 04/04/2016 1030   ALKPHOS 83 03/07/2016 1055   AST 21 04/04/2016 1030   AST 17 03/07/2016 1055   ALT 13 04/04/2016 1030   ALT 9 03/07/2016 1055   BILITOT 0.40 04/04/2016 1030   BILITOT 0.26 03/07/2016 1055         Impression and Plan: Ms. Brasington is a 82 year old white female. She has a locally advanced, inoperable, non-small cell lung cancer. She is not a candidate for chemotherapy or radiation therapy. She is not a candidate for surgery.  Thankfully, she has a high PD-L1 expression. As such, I  think she would be a great candidate for immunotherapy. I did this would be reasonable.  I Am a little bit worried about her anemia. This is a significant change since we last saw her. I did tell her to get back on her iron pills. I am checking some iron studies on her.   I will plan to get her back in 3 more weeks. After her third cycle treatment, we will repeat her scans and see how everything looks.  Again I do worry about this drop in hemoglobin. I did not do  a rectal exam on her but we certainly may have to think about this if her hemoglobin does not improve.  I spent about 25 minutes with she and her daughter today.   Volanda Napoleon, MD 2/1/201811:32 AM

## 2016-04-05 LAB — IRON AND TIBC
%SAT: 3 % — AB (ref 21–57)
Iron: 10 ug/dL — ABNORMAL LOW (ref 41–142)
TIBC: 384 ug/dL (ref 236–444)
UIBC: 374 ug/dL (ref 120–384)

## 2016-04-05 LAB — FERRITIN: FERRITIN: 16 ng/mL (ref 9–269)

## 2016-04-08 ENCOUNTER — Other Ambulatory Visit: Payer: Self-pay | Admitting: Family Medicine

## 2016-04-08 NOTE — Telephone Encounter (Signed)
Patient sent message today 04/08/16 that refill never arrived at pharmacy. I called the pharmacy and gave them a verbal refill for the Alprazolam. Called and sent my chart message prescription has been phoned and should be ready shortly for pickup. I did apologize for the delay in getting her medicine.

## 2016-04-10 ENCOUNTER — Telehealth: Payer: Self-pay | Admitting: *Deleted

## 2016-04-11 NOTE — Telephone Encounter (Signed)
I signed xanax refill as inappropiate. I saw you may have talked with her. If she wants xanax. Recommend she get from pcp. I am not sure about her history.I last saw her in 2016.

## 2016-04-12 NOTE — Telephone Encounter (Signed)
Received fax from [facility] on 04/10/16 stating that patient was requesting refill for her Xanax; upon calling patient, she stated that this had already been taken care of on Monday, 04/08/16 [the fax was dated for Saturday, 04/06/16.SLS 02/07

## 2016-04-25 ENCOUNTER — Ambulatory Visit (HOSPITAL_BASED_OUTPATIENT_CLINIC_OR_DEPARTMENT_OTHER): Payer: Medicare Other

## 2016-04-25 ENCOUNTER — Ambulatory Visit (HOSPITAL_BASED_OUTPATIENT_CLINIC_OR_DEPARTMENT_OTHER): Payer: Medicare Other | Admitting: Hematology & Oncology

## 2016-04-25 ENCOUNTER — Ambulatory Visit: Payer: Medicare Other

## 2016-04-25 ENCOUNTER — Other Ambulatory Visit (HOSPITAL_BASED_OUTPATIENT_CLINIC_OR_DEPARTMENT_OTHER): Payer: Medicare Other

## 2016-04-25 VITALS — Wt 89.5 lb

## 2016-04-25 DIAGNOSIS — Z5112 Encounter for antineoplastic immunotherapy: Secondary | ICD-10-CM | POA: Diagnosis not present

## 2016-04-25 DIAGNOSIS — C3431 Malignant neoplasm of lower lobe, right bronchus or lung: Secondary | ICD-10-CM

## 2016-04-25 DIAGNOSIS — R918 Other nonspecific abnormal finding of lung field: Secondary | ICD-10-CM

## 2016-04-25 DIAGNOSIS — D500 Iron deficiency anemia secondary to blood loss (chronic): Secondary | ICD-10-CM

## 2016-04-25 DIAGNOSIS — N184 Chronic kidney disease, stage 4 (severe): Secondary | ICD-10-CM

## 2016-04-25 LAB — CBC WITH DIFFERENTIAL (CANCER CENTER ONLY)
BASO#: 0.1 10*3/uL (ref 0.0–0.2)
BASO%: 1.1 % (ref 0.0–2.0)
EOS%: 2.7 % (ref 0.0–7.0)
Eosinophils Absolute: 0.3 10*3/uL (ref 0.0–0.5)
HEMATOCRIT: 28.5 % — AB (ref 34.8–46.6)
HEMOGLOBIN: 9 g/dL — AB (ref 11.6–15.9)
LYMPH#: 2.1 10*3/uL (ref 0.9–3.3)
LYMPH%: 17.4 % (ref 14.0–48.0)
MCH: 30.4 pg (ref 26.0–34.0)
MCHC: 31.6 g/dL — AB (ref 32.0–36.0)
MCV: 96 fL (ref 81–101)
MONO#: 1.2 10*3/uL — ABNORMAL HIGH (ref 0.1–0.9)
MONO%: 10 % (ref 0.0–13.0)
NEUT%: 68.8 % (ref 39.6–80.0)
NEUTROS ABS: 8.3 10*3/uL — AB (ref 1.5–6.5)
Platelets: 474 10*3/uL — ABNORMAL HIGH (ref 145–400)
RBC: 2.96 10*6/uL — AB (ref 3.70–5.32)
RDW: 16.9 % — ABNORMAL HIGH (ref 11.1–15.7)
WBC: 12 10*3/uL — AB (ref 3.9–10.0)

## 2016-04-25 LAB — CMP (CANCER CENTER ONLY)
ALBUMIN: 3 g/dL — AB (ref 3.3–5.5)
ALK PHOS: 53 U/L (ref 26–84)
ALT: 15 U/L (ref 10–47)
AST: 21 U/L (ref 11–38)
BILIRUBIN TOTAL: 0.4 mg/dL (ref 0.20–1.60)
BUN, Bld: 31 mg/dL — ABNORMAL HIGH (ref 7–22)
CO2: 28 meq/L (ref 18–33)
CREATININE: 1.5 mg/dL — AB (ref 0.6–1.2)
Calcium: 9.4 mg/dL (ref 8.0–10.3)
Chloride: 102 mEq/L (ref 98–108)
Glucose, Bld: 121 mg/dL — ABNORMAL HIGH (ref 73–118)
Potassium: 4 mEq/L (ref 3.3–4.7)
SODIUM: 140 meq/L (ref 128–145)
TOTAL PROTEIN: 6.1 g/dL — AB (ref 6.4–8.1)

## 2016-04-25 MED ORDER — SODIUM CHLORIDE 0.9 % IV SOLN
Freq: Once | INTRAVENOUS | Status: AC
  Administered 2016-04-25: 12:00:00 via INTRAVENOUS

## 2016-04-25 MED ORDER — SODIUM CHLORIDE 0.9% FLUSH
10.0000 mL | INTRAVENOUS | Status: AC | PRN
  Administered 2016-04-25: 10 mL
  Filled 2016-04-25: qty 10

## 2016-04-25 MED ORDER — HEPARIN SOD (PORK) LOCK FLUSH 100 UNIT/ML IV SOLN
500.0000 [IU] | Freq: Once | INTRAVENOUS | Status: AC | PRN
  Administered 2016-04-25: 500 [IU]
  Filled 2016-04-25: qty 5

## 2016-04-25 MED ORDER — PEMBROLIZUMAB CHEMO INJECTION 100 MG/4ML
200.0000 mg | Freq: Once | INTRAVENOUS | Status: AC
  Administered 2016-04-25: 200 mg via INTRAVENOUS
  Filled 2016-04-25: qty 8

## 2016-04-25 NOTE — Progress Notes (Signed)
Hematology and Oncology Follow Up Visit  Marissa Cruz [id number] 07-Jul-1934 82 y.o. 04/25/2016   Principle Diagnosis:   Stage IIIB ([id number]) poorly differentiated squamous cell carcinoma-unresectable -- HIGH PD-L1 (95%)  Current Therapy:   Keytruda every 3 week dosing-status post cycle #2     Interim History:  Marissa Cruz is back for follow-up.  She is now on Keytruda so far, she is done well with this. She has had problems with nausea or vomiting. She has had some diarrhea mixed with constipation. She is on various medications for this.  She has not noted any bleeding. She's had no fever. She's had no rashes.  There's been no headache. She's had no mouth sores. She's had no dysphagia or odynophagia.  She's had some slight leg swelling.  Her appetite might be a little bit better. The Megace that she is taking seems to be helping this. Her weight is still holding steady. Thankfully she is not losing weight.   She is not complaining of any chest wall pain. Hopefully this is also a good sign.   She has some occasional shortness of breath.   Overall, her performance status is ECOG 2.  Medications:  Current Outpatient Prescriptions:  .  acetaminophen (TYLENOL) 650 MG CR tablet, Take 1,300 mg by mouth at bedtime., Disp: , Rfl:  .  allopurinol (ZYLOPRIM) 100 MG tablet, TAKE 1 TABLET BY MOUTH AT BEDTIME, Disp: 90 tablet, Rfl: 1 .  ALPRAZolam (XANAX) 0.25 MG tablet, TAKE 1 TABLET BY MOUTH AS NEEDED FOR ANXIETY, Disp: 30 tablet, Rfl: 0 .  aspirin 81 MG tablet, Take 81 mg by mouth daily., Disp: , Rfl:  .  diphenhydrAMINE (SIMPLY SLEEP) 25 MG tablet, Take 25 mg by mouth at bedtime as needed for sleep., Disp: , Rfl:  .  docusate sodium (COLACE) 100 MG capsule, Take 100 mg by mouth every morning., Disp: , Rfl:  .  lidocaine-prilocaine (EMLA) cream, Apply to affected area once, Disp: 30 g, Rfl: 3 .  LORazepam (ATIVAN) 0.5 MG tablet, Take 1 tablet (0.5 mg total) by mouth every 6 (six) hours as  needed (Nausea or vomiting)., Disp: 30 tablet, Rfl: 0 .  megestrol (MEGACE) 40 MG/ML suspension, Take 10 mLs (400 mg total) by mouth daily., Disp: 480 mL, Rfl: 2 .  metoprolol succinate (TOPROL-XL) 25 MG 24 hr tablet, TAKE 1 TABLET(25 MG) BY MOUTH EVERY MORNING, Disp: 90 tablet, Rfl: 0 .  mirtazapine (REMERON SOL-TAB) 15 MG disintegrating tablet, DISSOLVE 1 TABLET ON THE TONGUE AT BEDTIME, Disp: 90 tablet, Rfl: 0 .  Multiple Vitamins-Minerals (HM MULTIVITAMIN ADULT GUMMY) CHEW, Chew 2 tablets by mouth every morning., Disp: , Rfl:  .  Naproxen Sodium 220 MG CAPS, Take by mouth., Disp: , Rfl:  .  omeprazole (PRILOSEC) 20 MG capsule, TAKE ONE CAPSULE BY MOUTH TWICE DAILY BEFORE A MEAL, Disp: 60 capsule, Rfl: 11 .  ondansetron (ZOFRAN) 8 MG tablet, Take 1 tablet (8 mg total) by mouth 2 (two) times daily as needed (Nausea or vomiting)., Disp: 30 tablet, Rfl: 1 .  ondansetron (ZOFRAN-ODT) 8 MG disintegrating tablet, Take 1 tablet (8 mg total) by mouth every 8 (eight) hours as needed for nausea or vomiting. Reported on 05/17/2015, Disp: 90 tablet, Rfl: 5 .  PARoxetine (PAXIL) 10 MG tablet, TAKE 1 TABLET BY MOUTH DAILY FOR DEPRESSION, Disp: 90 tablet, Rfl: 0 .  [name] 0.625 MG tablet, TAKE 1 TABLET BY MOUTH EVERY DAY FOR 21 DAYS, THEN DO NOT TAKE FOR 7 DAYS, Disp: 90  tablet, Rfl: 0 .  prochlorperazine (COMPAZINE) 10 MG tablet, TAKE 1 TABLET(10 MG) BY MOUTH EVERY 6 HOURS AS NEEDED FOR NAUSEA OR VOMITING, Disp: 385 tablet, Rfl: 1 .  traMADol (ULTRAM) 50 MG tablet, Take 1 tablet (50 mg total) by mouth every 6 (six) hours as needed., Disp: 90 tablet, Rfl: 0 .  traZODone (DESYREL) 50 MG tablet, TAKE 1 TABLET(50 MG) BY MOUTH AT BEDTIME, Disp: 90 tablet, Rfl: 0 No current facility-administered medications for this visit.   Facility-Administered Medications Ordered in Other Visits:  .  0.9 %  sodium chloride infusion, , Intravenous, Once, Volanda Napoleon, MD .  heparin lock flush 100 unit/mL, 500 Units,  Intracatheter, Once PRN, Volanda Napoleon, MD .  pembrolizumab [facility]) 200 mg in sodium chloride 0.9 % 50 mL chemo infusion, 200 mg, Intravenous, Once, Volanda Napoleon, MD .  sodium chloride flush (NS) 0.9 % injection 10 mL, 10 mL, Intracatheter, PRN, Volanda Napoleon, MD  Allergies:  Allergies  Allergen Reactions  . Morphine And Related Other (See Comments)    Hallucinations and combative  . Codeine Nausea Only    Past Medical History, Surgical history, Social history, and Family History were reviewed and updated.  Review of Systems: As above  Physical Exam:  vitals were not taken for this visit.  Wt Readings from Last 3 Encounters:  03/12/16 89 lb (40.4 kg)  03/07/16 89 lb 1.9 oz (40.4 kg)  03/05/16 89 lb 12.8 oz (40.7 kg)     Thin white female in no obvious distress. Head and neck exam shows no ocular or oral lesions. She has no palpable cervical or supraclavicular lymph nodes. Lungs are clear. There may be some slight decrease over on the right side. Cardiac exam regular rate and rhythm with no murmurs, rubs or bruits. Abdomen is soft. She has good bowel sounds. There is no fluid wave. There is no palpable liver or spleen tip. Back exam shows no tenderness over the spine, ribs or hips. Externally shows no clubbing, cyanosis or edema. Neurological exam shows no focal neurological deficits. Skin exam shows no rashes, ecchymoses or petechia.  Lab Results  Component Value Date   WBC 12.0 (H) 04/25/2016   HGB 9.0 (L) 04/25/2016   HCT 28.5 (L) 04/25/2016   MCV 96 04/25/2016   PLT 474 (H) 04/25/2016     Chemistry      Component Value Date/Time   NA 140 04/25/2016 1125   NA 136 03/07/2016 1055   K 4.0 04/25/2016 1125   K 4.1 03/07/2016 1055   CL 102 04/25/2016 1125   CO2 28 04/25/2016 1125   CO2 26 03/07/2016 1055   BUN 31 (H) 04/25/2016 1125   BUN 28.7 (H) 03/07/2016 1055   CREATININE 1.5 (H) 04/25/2016 1125   CREATININE 1.3 (H) 03/07/2016 1055      Component Value  Date/Time   CALCIUM 9.4 04/25/2016 1125   CALCIUM 9.5 03/07/2016 1055   ALKPHOS 53 04/25/2016 1125   ALKPHOS 83 03/07/2016 1055   AST 21 04/25/2016 1125   AST 17 03/07/2016 1055   ALT 15 04/25/2016 1125   ALT 9 03/07/2016 1055   BILITOT 0.40 04/25/2016 1125   BILITOT 0.26 03/07/2016 1055         Impression and Plan: Ms. Marton is a 82 year old white female. She has a locally advanced, inoperable, non-small cell lung cancer. She is not a candidate for chemotherapy or radiation therapy. She is not a candidate for surgery.  Thankfully,  she has a high PD-L1 expression. As such, I think she would be a great candidate for immunotherapy. I did this would be reasonable.  We will get a PET scan in 2 weeks on her. Hopefully we will see a response. The fact that she does not have any chest wall pain is a good sign that she is responding.  Hopefully, we can get her weight up. She just is not gaining weight. I know she is not losing some weight, so this hopefully is also encouraging.   I spent about 25 minutes with she and her daughter today.   Volanda Napoleon, MD 2/22/20181:01 PM

## 2016-04-25 NOTE — Patient Instructions (Signed)
[facility] Discharge Instructions for Patients Receiving Chemotherapy  Today you received the following chemotherapy agents Keytruda  To help prevent nausea and vomiting after your treatment, we encourage you to take your nausea medication as prescribed    If you develop nausea and vomiting that is not controlled by your nausea medication, call the clinic. If it is after clinic hours your family physician or the after hours number for the clinic or go to the Emergency Department.   BELOW ARE SYMPTOMS THAT SHOULD BE REPORTED IMMEDIATELY:  *FEVER GREATER THAN 100.5 F  *CHILLS WITH OR WITHOUT FEVER  NAUSEA AND VOMITING THAT IS NOT CONTROLLED WITH YOUR NAUSEA MEDICATION  *UNUSUAL SHORTNESS OF BREATH  *UNUSUAL BRUISING OR BLEEDING  TENDERNESS IN MOUTH AND THROAT WITH OR WITHOUT PRESENCE OF ULCERS  *URINARY PROBLEMS  *BOWEL PROBLEMS  UNUSUAL RASH Items with * indicate a potential emergency and should be followed up as soon as possible.  One of the nurses will contact you 24 hours after your treatment. Please let the nurse know about any problems that you may have experienced. Feel free to call the clinic you have any questions or concerns. The clinic phone number is [phone].   I have been informed and understand all the instructions given to me. I know to contact the clinic, my physician, or go to the Emergency Department if any problems should occur. I do not have any questions at this time, but understand that I may call the clinic during office hours   should I have any questions or need assistance in obtaining follow up care.    __________________________________________  _____________  __________ Signature of Patient or Authorized Representative            Date                   Time    __________________________________________ Nurse's Signature

## 2016-04-25 NOTE — Progress Notes (Unsigned)
Error-disregard    Review of Systems - Oncology    PHYSICAL EXAMINATION  ECOG PERFORMANCE STATUS:   There were no vitals filed for this visit.  Physical Exam

## 2016-05-09 ENCOUNTER — Ambulatory Visit (HOSPITAL_COMMUNITY)
Admission: RE | Admit: 2016-05-09 | Discharge: 2016-05-09 | Disposition: A | Discharge status: Home / Self Care | Payer: Medicare Other | Source: Ambulatory Visit | Attending: Hematology & Oncology | Admitting: Hematology & Oncology

## 2016-05-09 DIAGNOSIS — C3491 Malignant neoplasm of unspecified part of right bronchus or lung: Secondary | ICD-10-CM | POA: Diagnosis not present

## 2016-05-09 DIAGNOSIS — I700 Atherosclerosis of aorta: Secondary | ICD-10-CM | POA: Diagnosis not present

## 2016-05-09 DIAGNOSIS — C3431 Malignant neoplasm of lower lobe, right bronchus or lung: Secondary | ICD-10-CM | POA: Diagnosis not present

## 2016-05-09 DIAGNOSIS — N184 Chronic kidney disease, stage 4 (severe): Secondary | ICD-10-CM | POA: Insufficient documentation

## 2016-05-09 DIAGNOSIS — K8020 Calculus of gallbladder without cholecystitis without obstruction: Secondary | ICD-10-CM | POA: Insufficient documentation

## 2016-05-09 DIAGNOSIS — D500 Iron deficiency anemia secondary to blood loss (chronic): Secondary | ICD-10-CM

## 2016-05-09 LAB — GLUCOSE, CAPILLARY: GLUCOSE-CAPILLARY: 82 mg/dL (ref 65–99)

## 2016-05-09 MED ORDER — FLUDEOXYGLUCOSE F - 18 (FDG) INJECTION
5.0200 | Freq: Once | INTRAVENOUS | Status: AC | PRN
  Administered 2016-05-09: 5.02 via INTRAVENOUS

## 2016-05-16 ENCOUNTER — Other Ambulatory Visit (HOSPITAL_BASED_OUTPATIENT_CLINIC_OR_DEPARTMENT_OTHER): Payer: Medicare Other

## 2016-05-16 ENCOUNTER — Ambulatory Visit (HOSPITAL_BASED_OUTPATIENT_CLINIC_OR_DEPARTMENT_OTHER): Payer: Medicare Other

## 2016-05-16 ENCOUNTER — Ambulatory Visit (HOSPITAL_BASED_OUTPATIENT_CLINIC_OR_DEPARTMENT_OTHER): Payer: Medicare Other | Admitting: Family

## 2016-05-16 ENCOUNTER — Encounter: Payer: Self-pay | Admitting: Family

## 2016-05-16 ENCOUNTER — Ambulatory Visit: Payer: Medicare Other

## 2016-05-16 VITALS — BP 121/59 | HR 103 | Temp 97.7°F | Resp 18 | Wt 88.0 lb

## 2016-05-16 DIAGNOSIS — N184 Chronic kidney disease, stage 4 (severe): Secondary | ICD-10-CM | POA: Diagnosis not present

## 2016-05-16 DIAGNOSIS — R918 Other nonspecific abnormal finding of lung field: Secondary | ICD-10-CM

## 2016-05-16 DIAGNOSIS — D500 Iron deficiency anemia secondary to blood loss (chronic): Secondary | ICD-10-CM | POA: Diagnosis not present

## 2016-05-16 DIAGNOSIS — C3431 Malignant neoplasm of lower lobe, right bronchus or lung: Secondary | ICD-10-CM | POA: Diagnosis not present

## 2016-05-16 DIAGNOSIS — Z5112 Encounter for antineoplastic immunotherapy: Secondary | ICD-10-CM

## 2016-05-16 LAB — CBC WITH DIFFERENTIAL (CANCER CENTER ONLY)
BASO#: 0.2 10*3/uL (ref 0.0–0.2)
BASO%: 1.3 % (ref 0.0–2.0)
EOS%: 2.4 % (ref 0.0–7.0)
Eosinophils Absolute: 0.3 10*3/uL (ref 0.0–0.5)
HCT: 32 % — ABNORMAL LOW (ref 34.8–46.6)
HGB: 10.3 g/dL — ABNORMAL LOW (ref 11.6–15.9)
LYMPH#: 1.8 10*3/uL (ref 0.9–3.3)
LYMPH%: 15.3 % (ref 14.0–48.0)
MCH: 30.9 pg (ref 26.0–34.0)
MCHC: 32.2 g/dL (ref 32.0–36.0)
MCV: 96 fL (ref 81–101)
MONO#: 1.3 10*3/uL — AB (ref 0.1–0.9)
MONO%: 10.8 % (ref 0.0–13.0)
NEUT#: 8.4 10*3/uL — ABNORMAL HIGH (ref 1.5–6.5)
NEUT%: 70.2 % (ref 39.6–80.0)
Platelets: 409 10*3/uL — ABNORMAL HIGH (ref 145–400)
RBC: 3.33 10*6/uL — ABNORMAL LOW (ref 3.70–5.32)
RDW: 16.3 % — AB (ref 11.1–15.7)
WBC: 11.9 10*3/uL — ABNORMAL HIGH (ref 3.9–10.0)

## 2016-05-16 LAB — FERRITIN: Ferritin: 21 ng/ml (ref 9–269)

## 2016-05-16 LAB — CMP (CANCER CENTER ONLY)
ALK PHOS: 56 U/L (ref 26–84)
ALT: 17 U/L (ref 10–47)
AST: 24 U/L (ref 11–38)
Albumin: 3.2 g/dL — ABNORMAL LOW (ref 3.3–5.5)
BUN: 29 mg/dL — AB (ref 7–22)
CO2: 27 mEq/L (ref 18–33)
Calcium: 9.4 mg/dL (ref 8.0–10.3)
Chloride: 101 mEq/L (ref 98–108)
Creat: 1.4 mg/dl — ABNORMAL HIGH (ref 0.6–1.2)
GLUCOSE: 113 mg/dL (ref 73–118)
POTASSIUM: 3.8 meq/L (ref 3.3–4.7)
Sodium: 139 mEq/L (ref 128–145)
Total Bilirubin: 0.4 mg/dl (ref 0.20–1.60)
Total Protein: 6.2 g/dL — ABNORMAL LOW (ref 6.4–8.1)

## 2016-05-16 LAB — IRON AND TIBC
%SAT: 19 % — AB (ref 21–57)
Iron: 68 ug/dL (ref 41–142)
TIBC: 365 ug/dL (ref 236–444)
UIBC: 297 ug/dL (ref 120–384)

## 2016-05-16 MED ORDER — SODIUM CHLORIDE 0.9 % IV SOLN
Freq: Once | INTRAVENOUS | Status: AC
  Administered 2016-05-16: 12:00:00 via INTRAVENOUS

## 2016-05-16 MED ORDER — SODIUM CHLORIDE 0.9% FLUSH
10.0000 mL | INTRAVENOUS | Status: DC | PRN
  Administered 2016-05-16: 10 mL
  Filled 2016-05-16: qty 10

## 2016-05-16 MED ORDER — SODIUM CHLORIDE 0.9 % IV SOLN
200.0000 mg | Freq: Once | INTRAVENOUS | Status: AC
  Administered 2016-05-16: 200 mg via INTRAVENOUS
  Filled 2016-05-16: qty 8

## 2016-05-16 MED ORDER — HEPARIN SOD (PORK) LOCK FLUSH 100 UNIT/ML IV SOLN
500.0000 [IU] | Freq: Once | INTRAVENOUS | Status: AC | PRN
  Administered 2016-05-16: 500 [IU]
  Filled 2016-05-16: qty 5

## 2016-05-16 NOTE — Patient Instructions (Signed)
Implanted Port Home Guide An implanted port is a type of central line that is placed under the skin. Central lines are used to provide IV access when treatment or nutrition needs to be given through a person's veins. Implanted ports are used for long-term IV access. An implanted port may be placed because:  You need IV medicine that would be irritating to the small veins in your hands or arms.  You need long-term IV medicines, such as antibiotics.  You need IV nutrition for a long period.  You need frequent blood draws for lab tests.  You need dialysis.  Implanted ports are usually placed in the chest area, but they can also be placed in the upper arm, the abdomen, or the leg. An implanted port has two main parts:  Reservoir. The reservoir is round and will appear as a small, raised area under your skin. The reservoir is the part where a needle is inserted to give medicines or draw blood.  Catheter. The catheter is a thin, flexible tube that extends from the reservoir. The catheter is placed into a large vein. Medicine that is inserted into the reservoir goes into the catheter and then into the vein.  How will I care for my incision site? Do not get the incision site wet. Bathe or shower as directed by your health care provider. How is my port accessed? Special steps must be taken to access the port:  Before the port is accessed, a numbing cream can be placed on the skin. This helps numb the skin over the port site.  Your health care provider uses a sterile technique to access the port. ? Your health care provider must put on a mask and sterile gloves. ? The skin over your port is cleaned carefully with an antiseptic and allowed to dry. ? The port is gently pinched between sterile gloves, and a needle is inserted into the port.  Only "non-coring" port needles should be used to access the port. Once the port is accessed, a blood return should be checked. This helps ensure that the port  is in the vein and is not clogged.  If your port needs to remain accessed for a constant infusion, a clear (transparent) bandage will be placed over the needle site. The bandage and needle will need to be changed every week, or as directed by your health care provider.  Keep the bandage covering the needle clean and dry. Do not get it wet. Follow your health care provider's instructions on how to take a shower or bath while the port is accessed.  If your port does not need to stay accessed, no bandage is needed over the port.  What is flushing? Flushing helps keep the port from getting clogged. Follow your health care provider's instructions on how and when to flush the port. Ports are usually flushed with saline solution or a medicine called heparin. The need for flushing will depend on how the port is used.  If the port is used for intermittent medicines or blood draws, the port will need to be flushed: ? After medicines have been given. ? After blood has been drawn. ? As part of routine maintenance.  If a constant infusion is running, the port may not need to be flushed.  How long will my port stay implanted? The port can stay in for as long as your health care provider thinks it is needed. When it is time for the port to come out, surgery will be   done to remove it. The procedure is similar to the one performed when the port was put in. When should I seek immediate medical care? When you have an implanted port, you should seek immediate medical care if:  You notice a bad smell coming from the incision site.  You have swelling, redness, or drainage at the incision site.  You have more swelling or pain at the port site or the surrounding area.  You have a fever that is not controlled with medicine.  This information is not intended to replace advice given to you by your health care provider. Make sure you discuss any questions you have with your health care provider. Document  Released: 02/18/2005 Document Revised: 07/27/2015 Document Reviewed: 10/26/2012 [employer] Interactive Patient Education  2017 [employer]  

## 2016-05-16 NOTE — Patient Instructions (Signed)
Pembrolizumab injection  What is this medicine?  PEMBROLIZUMAB (pem broe liz ue mab) is a monoclonal antibody. It is used to treat melanoma, head and neck cancer, Hodgkin lymphoma, non-small cell lung cancer, urothelial cancer, stomach cancer, and cancers that have a certain genetic condition.  This medicine may be used for other purposes; ask your health care provider or pharmacist if you have questions.  COMMON BRAND NAME(S): Keytruda  What should I tell my health care provider before I take this medicine?  They need to know if you have any of these conditions:  -diabetes  -immune system problems  -inflammatory bowel disease  -liver disease  -lung or breathing disease  -lupus  -organ transplant  -an unusual or allergic reaction to pembrolizumab, other medicines, foods, dyes, or preservatives  -pregnant or trying to get pregnant  -breast-feeding  How should I use this medicine?  This medicine is for infusion into a vein. It is given by a health care professional in a hospital or clinic setting.  A special MedGuide will be given to you before each treatment. Be sure to read this information carefully each time.  Talk to your pediatrician regarding the use of this medicine in children. While this drug may be prescribed for selected conditions, precautions do apply.  Overdosage: If you think you have taken too much of this medicine contact a poison control center or emergency room at once.  NOTE: This medicine is only for you. Do not share this medicine with others.  What if I miss a dose?  It is important not to miss your dose. Call your doctor or health care professional if you are unable to keep an appointment.  What may interact with this medicine?  Interactions have not been studied.  Give your health care provider a list of all the medicines, herbs, non-prescription drugs, or dietary supplements you use. Also tell them if you smoke, drink alcohol, or use illegal drugs. Some items may interact with your  medicine.  This list may not describe all possible interactions. Give your health care provider a list of all the medicines, herbs, non-prescription drugs, or dietary supplements you use. Also tell them if you smoke, drink alcohol, or use illegal drugs. Some items may interact with your medicine.  What should I watch for while using this medicine?  Your condition will be monitored carefully while you are receiving this medicine.  You may need blood work done while you are taking this medicine.  Do not become pregnant while taking this medicine or for 4 months after stopping it. Women should inform their doctor if they wish to become pregnant or think they might be pregnant. There is a potential for serious side effects to an unborn child. Talk to your health care professional or pharmacist for more information. Do not breast-feed an infant while taking this medicine or for 4 months after the last dose.  What side effects may I notice from receiving this medicine?  Side effects that you should report to your doctor or health care professional as soon as possible:  -allergic reactions like skin rash, itching or hives, swelling of the face, lips, or tongue  -bloody or black, tarry  -breathing problems  -changes in vision  -chest pain  -chills  -constipation  -cough  -dizziness or feeling faint or lightheaded  -fast or irregular heartbeat  -fever  -flushing  -hair loss  -low blood counts - this medicine may decrease the number of white blood cells, red blood cells   and platelets. You may be at increased risk for infections and bleeding.  -muscle pain  -muscle weakness  -persistent headache  -signs and symptoms of high blood sugar such as dizziness; dry mouth; dry skin; fruity breath; nausea; stomach pain; increased hunger or thirst; increased urination  -signs and symptoms of kidney injury like trouble passing urine or change in the amount of urine  -signs and symptoms of liver injury like dark urine, light-colored  stools, loss of appetite, nausea, right upper belly pain, yellowing of the eyes or skin  -stomach pain  -sweating  -weight loss  Side effects that usually do not require medical attention (report to your doctor or health care professional if they continue or are bothersome):  -decreased appetite  -diarrhea  -tiredness  This list may not describe all possible side effects. Call your doctor for medical advice about side effects. You may report side effects to FDA at 1-800-FDA-1088.  Where should I keep my medicine?  This drug is given in a hospital or clinic and will not be stored at home.  NOTE: This sheet is a summary. It may not cover all possible information. If you have questions about this medicine, talk to your doctor, pharmacist, or health care provider.   2018 Elsevier/Gold Standard (2015-11-28 12:29:36)

## 2016-05-16 NOTE — Progress Notes (Signed)
Hematology and Oncology Follow Up Visit  Marty Sadlowski [id number] December 31, 1934 82 y.o. 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 Cothran is here today with her daughter for follow-up. She is doing well and has no complaints at this time. Hgb has improved to 10.3 with an MCV of 96 and platelet count is down to 409.  Her PET scan last week showed an interval response to therapy with decrease in size and hypermetabolism of the right lower lobe pulmonary lesion and associated hilar and mediastinal hypermetabolic lymphadenopathy. No new or progressive disease noted. No fever, chills, vomiting , cough, rash, dizziness, SOB, chest pain, palpitations, abdominal pain or changes in bowel or bladder habits.  She has nausea in the mornings when she first gets up which is not a new issue for her. Her appetite comes and goes. She is staying hydrated. Her weight is stable.  No swelling, tenderness, numbness or tingling in her extremities. No c/o pain.   Medications:  Allergies as of 05/16/2016      Reactions   Morphine And Related Other (See Comments)   Hallucinations and combative   Codeine Nausea Only      Medication List       Accurate as of 05/16/16 11:18 AM. Always use your most recent med list.          acetaminophen 650 MG CR tablet Commonly known as:  TYLENOL Take 1,300 mg by mouth at bedtime.   allopurinol 100 MG tablet Commonly known as:  ZYLOPRIM TAKE 1 TABLET BY MOUTH AT BEDTIME   ALPRAZolam 0.25 MG tablet Commonly known as:  XANAX TAKE 1 TABLET BY MOUTH AS NEEDED FOR ANXIETY   amLODipine 5 MG tablet Commonly known as:  NORVASC TK 1 T PO D FOR HIGH BP   aspirin 81 MG tablet Take 81 mg by mouth daily.   docusate sodium 100 MG capsule Commonly known as:  COLACE Take 100 mg by mouth  every morning.   HM MULTIVITAMIN ADULT GUMMY Chew Chew 2 tablets by mouth every morning.   lidocaine-prilocaine cream Commonly known as:  EMLA Apply to affected area once   LORazepam 0.5 MG tablet Commonly known as:  ATIVAN Take 1 tablet (0.5 mg total) by mouth every 6 (six) hours as needed (Nausea or vomiting).   megestrol 40 MG/ML suspension Commonly known as:  MEGACE Take 10 mLs (400 mg total) by mouth daily.   Melatonin 5 MG Caps Take 5 mg by mouth at bedtime.   metoprolol succinate 25 MG 24 hr tablet Commonly known as:  TOPROL-XL TAKE 1 TABLET(25 MG) BY MOUTH EVERY MORNING   mirtazapine 15 MG disintegrating tablet Commonly known as:  REMERON SOL-TAB DISSOLVE 1 TABLET ON THE TONGUE AT BEDTIME   Naproxen Sodium 220 MG Caps Take by mouth.   omeprazole 20 MG capsule Commonly known as:  PRILOSEC TAKE ONE CAPSULE BY MOUTH TWICE DAILY BEFORE A MEAL   ondansetron 8 MG disintegrating tablet Commonly known as:  ZOFRAN-ODT Take 1 tablet (8 mg total) by mouth every 8 (eight) hours as needed for nausea or vomiting. Reported on 05/17/2015   ondansetron 8 MG tablet Commonly known as:  ZOFRAN Take 1 tablet (8 mg total) by mouth 2 (two) times daily as needed (Nausea or vomiting).   PARoxetine 10 MG tablet Commonly known  as:  PAXIL TAKE 1 TABLET BY MOUTH DAILY FOR DEPRESSION   [name] 0.625 MG tablet Generic drug:  estrogens (conjugated) TAKE 1 TABLET BY MOUTH EVERY DAY FOR 21 DAYS, THEN DO NOT TAKE FOR 7 DAYS   prochlorperazine 10 MG tablet Commonly known as:  COMPAZINE TAKE 1 TABLET(10 MG) BY MOUTH EVERY 6 HOURS AS NEEDED FOR NAUSEA OR VOMITING   SIMPLY SLEEP 25 MG tablet Generic drug:  diphenhydrAMINE Take 25 mg by mouth at bedtime as needed for sleep.   traMADol 50 MG tablet Commonly known as:  ULTRAM Take 1 tablet (50 mg total) by mouth every 6 (six) hours as needed.   traZODone 50 MG tablet Commonly known as:  DESYREL TAKE 1 TABLET(50 MG) BY MOUTH AT  BEDTIME       Allergies:  Allergies  Allergen Reactions  . Morphine And Related Other (See Comments)    Hallucinations and combative  . Codeine Nausea Only    Past Medical History, Surgical history, Social history, and Family History were reviewed and updated.  Review of Systems: All other 10 point review of systems is negative.   Physical Exam:  weight is 88 lb (39.9 kg). Her oral temperature is 97.7 F (36.5 C). Her blood pressure is 121/59 (abnormal) and her pulse is 103 (abnormal). Her respiration is 18 and oxygen saturation is 97%.   Wt Readings from Last 3 Encounters:  05/16/16 88 lb (39.9 kg)  04/25/16 89 lb 8 oz (40.6 kg)  03/12/16 89 lb (40.4 kg)    Ocular: Sclerae unicteric, pupils equal, round and reactive to light Ear-nose-throat: Oropharynx clear, dentition fair Lymphatic: No cervical supraclavicular or axillary adenopathy Lungs no rales or rhonchi, good excursion bilaterally Heart regular rate and rhythm, no murmur appreciated Abd soft, nontender, positive bowel sounds, no liver or spleen tip palpated on exam, no fluid wave MSK no focal spinal tenderness, no joint edema Neuro: non-focal, well-oriented, appropriate affect Breasts: Deferred  Lab Results  Component Value Date   WBC 11.9 (H) 05/16/2016   HGB 10.3 (L) 05/16/2016   HCT 32.0 (L) 05/16/2016   MCV 96 05/16/2016   PLT 409 (H) 05/16/2016   Lab Results  Component Value Date   FERRITIN 16 04/04/2016   IRON 10 (L) 04/04/2016   TIBC 384 04/04/2016   UIBC 374 04/04/2016   IRONPCTSAT 3 (L) 04/04/2016   Lab Results  Component Value Date   RBC 3.33 (L) 05/16/2016   No results found for: KPAFRELGTCHN, LAMBDASER, KAPLAMBRATIO No results found for: IGGSERUM, IGA, IGMSERUM No results found for: Odetta Pink, SPEI   Chemistry      Component Value Date/Time   NA 140 04/25/2016 1125   NA 136 03/07/2016 1055   K 4.0 04/25/2016 1125   K 4.1  03/07/2016 1055   CL 102 04/25/2016 1125   CO2 28 04/25/2016 1125   CO2 26 03/07/2016 1055   BUN 31 (H) 04/25/2016 1125   BUN 28.7 (H) 03/07/2016 1055   CREATININE 1.5 (H) 04/25/2016 1125   CREATININE 1.3 (H) 03/07/2016 1055      Component Value Date/Time   CALCIUM 9.4 04/25/2016 1125   CALCIUM 9.5 03/07/2016 1055   ALKPHOS 53 04/25/2016 1125   ALKPHOS 83 03/07/2016 1055   AST 21 04/25/2016 1125   AST 17 03/07/2016 1055   ALT 15 04/25/2016 1125   ALT 9 03/07/2016 1055   BILITOT 0.40 04/25/2016 1125   BILITOT 0.26 03/07/2016 1055  Impression and Plan: Ms. Misenheimer is a 54 to white female with locally advanced, inoperable, non-small cell lung cancer. She is not a candidate for chemotherapy, radiation therapy or surgery. Thankfully, she has a high PD-L1 expression and is candidate for immunotherapy. So far she has done well and had a nice response noted on her PET scan last week.  Iron studies are pending.  We will proceed with cycle 4 today as planned.  Her weight is stable. She drinks carnation instant breakfast once a day and states that she really is not interested in doing it more than once so we will keep plugging along.  She has her current treatment/appointment schedule.  Both she and her daughter know to contact our office with any questions or concerns. We can certainly see her sooner if need be.   Eliezer Bottom, NP 3/15/201811:18 AM

## 2016-05-17 LAB — ERYTHROPOIETIN: Erythropoietin: 8.7 m[IU]/mL (ref 2.6–18.5)

## 2016-05-17 LAB — RETICULOCYTES: Reticulocyte Count: 2 % (ref 0.6–2.6)

## 2016-05-27 ENCOUNTER — Other Ambulatory Visit: Payer: Self-pay | Admitting: Family

## 2016-05-28 DIAGNOSIS — H353222 Exudative age-related macular degeneration, left eye, with inactive choroidal neovascularization: Secondary | ICD-10-CM | POA: Diagnosis not present

## 2016-05-28 DIAGNOSIS — H353112 Nonexudative age-related macular degeneration, right eye, intermediate dry stage: Secondary | ICD-10-CM | POA: Diagnosis not present

## 2016-05-29 ENCOUNTER — Other Ambulatory Visit: Payer: Self-pay | Admitting: Family Medicine

## 2016-06-04 ENCOUNTER — Other Ambulatory Visit: Payer: Self-pay | Admitting: Family Medicine

## 2016-06-06 ENCOUNTER — Ambulatory Visit (HOSPITAL_BASED_OUTPATIENT_CLINIC_OR_DEPARTMENT_OTHER): Payer: Medicare Other

## 2016-06-06 ENCOUNTER — Other Ambulatory Visit (HOSPITAL_BASED_OUTPATIENT_CLINIC_OR_DEPARTMENT_OTHER): Payer: Medicare Other

## 2016-06-06 ENCOUNTER — Ambulatory Visit: Payer: Medicare Other

## 2016-06-06 ENCOUNTER — Ambulatory Visit (HOSPITAL_BASED_OUTPATIENT_CLINIC_OR_DEPARTMENT_OTHER): Payer: Medicare Other | Admitting: Hematology & Oncology

## 2016-06-06 ENCOUNTER — Encounter: Payer: Self-pay | Admitting: Hematology & Oncology

## 2016-06-06 VITALS — BP 153/68 | HR 87 | Resp 17

## 2016-06-06 DIAGNOSIS — Z5112 Encounter for antineoplastic immunotherapy: Secondary | ICD-10-CM

## 2016-06-06 DIAGNOSIS — C3431 Malignant neoplasm of lower lobe, right bronchus or lung: Secondary | ICD-10-CM

## 2016-06-06 DIAGNOSIS — E039 Hypothyroidism, unspecified: Secondary | ICD-10-CM

## 2016-06-06 DIAGNOSIS — C3490 Malignant neoplasm of unspecified part of unspecified bronchus or lung: Secondary | ICD-10-CM

## 2016-06-06 DIAGNOSIS — I10 Essential (primary) hypertension: Secondary | ICD-10-CM | POA: Diagnosis not present

## 2016-06-06 DIAGNOSIS — D500 Iron deficiency anemia secondary to blood loss (chronic): Secondary | ICD-10-CM | POA: Diagnosis not present

## 2016-06-06 DIAGNOSIS — K909 Intestinal malabsorption, unspecified: Secondary | ICD-10-CM

## 2016-06-06 HISTORY — DX: Iron deficiency anemia secondary to blood loss (chronic): D50.0

## 2016-06-06 HISTORY — DX: Intestinal malabsorption, unspecified: K90.9

## 2016-06-06 LAB — CMP (CANCER CENTER ONLY)
ALBUMIN: 3 g/dL — AB (ref 3.3–5.5)
ALK PHOS: 59 U/L (ref 26–84)
ALT(SGPT): 18 U/L (ref 10–47)
AST: 25 U/L (ref 11–38)
BILIRUBIN TOTAL: 0.4 mg/dL (ref 0.20–1.60)
BUN, Bld: 37 mg/dL — ABNORMAL HIGH (ref 7–22)
CALCIUM: 9 mg/dL (ref 8.0–10.3)
CO2: 26 meq/L (ref 18–33)
CREATININE: 1.5 mg/dL — AB (ref 0.6–1.2)
Chloride: 106 mEq/L (ref 98–108)
Glucose, Bld: 103 mg/dL (ref 73–118)
Potassium: 4.3 mEq/L (ref 3.3–4.7)
SODIUM: 140 meq/L (ref 128–145)
TOTAL PROTEIN: 6 g/dL — AB (ref 6.4–8.1)

## 2016-06-06 LAB — CBC WITH DIFFERENTIAL (CANCER CENTER ONLY)
BASO#: 0.1 10*3/uL (ref 0.0–0.2)
BASO%: 0.6 % (ref 0.0–2.0)
EOS%: 6.6 % (ref 0.0–7.0)
Eosinophils Absolute: 0.8 10*3/uL — ABNORMAL HIGH (ref 0.0–0.5)
HEMATOCRIT: 31.6 % — AB (ref 34.8–46.6)
HEMOGLOBIN: 10.2 g/dL — AB (ref 11.6–15.9)
LYMPH#: 1.7 10*3/uL (ref 0.9–3.3)
LYMPH%: 13.6 % — ABNORMAL LOW (ref 14.0–48.0)
MCH: 31.5 pg (ref 26.0–34.0)
MCHC: 32.3 g/dL (ref 32.0–36.0)
MCV: 98 fL (ref 81–101)
MONO#: 1.1 10*3/uL — ABNORMAL HIGH (ref 0.1–0.9)
MONO%: 8.7 % (ref 0.0–13.0)
NEUT%: 70.5 % (ref 39.6–80.0)
NEUTROS ABS: 8.6 10*3/uL — AB (ref 1.5–6.5)
Platelets: 378 10*3/uL (ref 145–400)
RBC: 3.24 10*6/uL — AB (ref 3.70–5.32)
RDW: 15.8 % — ABNORMAL HIGH (ref 11.1–15.7)
WBC: 12.1 10*3/uL — ABNORMAL HIGH (ref 3.9–10.0)

## 2016-06-06 LAB — IRON AND TIBC
%SAT: 21 % (ref 21–57)
Iron: 75 ug/dL (ref 41–142)
TIBC: 364 ug/dL (ref 236–444)
UIBC: 289 ug/dL (ref 120–384)

## 2016-06-06 MED ORDER — HEPARIN SOD (PORK) LOCK FLUSH 100 UNIT/ML IV SOLN
500.0000 [IU] | Freq: Once | INTRAVENOUS | Status: AC | PRN
  Administered 2016-06-06: 500 [IU]
  Filled 2016-06-06: qty 5

## 2016-06-06 MED ORDER — FERUMOXYTOL INJECTION 510 MG/17 ML
510.0000 mg | Freq: Once | INTRAVENOUS | Status: AC
  Administered 2016-06-06: 510 mg via INTRAVENOUS
  Filled 2016-06-06: qty 17

## 2016-06-06 MED ORDER — PEMBROLIZUMAB CHEMO INJECTION 100 MG/4ML
200.0000 mg | Freq: Once | INTRAVENOUS | Status: AC
  Administered 2016-06-06: 200 mg via INTRAVENOUS
  Filled 2016-06-06: qty 8

## 2016-06-06 MED ORDER — PALONOSETRON HCL INJECTION 0.25 MG/5ML
INTRAVENOUS | Status: AC
  Filled 2016-06-06: qty 5

## 2016-06-06 MED ORDER — SODIUM CHLORIDE 0.9 % IV SOLN: Freq: Once | INTRAVENOUS | Status: DC

## 2016-06-06 MED ORDER — SODIUM CHLORIDE 0.9 % IV SOLN
Freq: Once | INTRAVENOUS | Status: AC
  Administered 2016-06-06: 12:00:00 via INTRAVENOUS

## 2016-06-06 MED ORDER — SODIUM CHLORIDE 0.9% FLUSH
10.0000 mL | INTRAVENOUS | Status: DC | PRN
  Administered 2016-06-06: 10 mL
  Filled 2016-06-06: qty 10

## 2016-06-06 NOTE — Progress Notes (Signed)
Hematology and Oncology Follow Up Visit  Marissa Cruz [id number] 02-15-1935 82 y.o. 06/06/2016   Principle Diagnosis:  Stage IIIB ([id number]) poorly differentiated squamous cell carcinoma-unresectable -- HIGH PD-L1 (95%) Iron deficiency anemia - malabsorption  Current Therapy:   Keytruda every 3 week dosing - status post cycle 3  IV feraheme - dose given on 06/06/2016      Interim History: Marissa Cruz is here today with her daughter for follow-up. She is doing well and has no complaints at this time. She is gaining a little bit of weight. She is now on Megace.  She has had no problems with cough. She's had no rashes. She has had a little bit of pruritus.  She's had no change in bowel or bladder habits. Per graph she is in a wheelchair. She just has a hard time walking. This is been a chronic issue.  She has had no obvious bleeding.  She has had no pain. She's had no mouth sores. She's had no headache.  Overall, her performance status is ECOG 2.    Medications:  Allergies as of 06/06/2016      Reactions   Morphine And Related Other (See Comments)   Hallucinations and combative   Codeine Nausea Only      Medication List       Accurate as of 06/06/16 11:49 AM. Always use your most recent med list.          acetaminophen 650 MG CR tablet Commonly known as:  TYLENOL Take 1,300 mg by mouth at bedtime.   allopurinol 100 MG tablet Commonly known as:  ZYLOPRIM TAKE 1 TABLET BY MOUTH AT BEDTIME   ALPRAZolam 0.25 MG tablet Commonly known as:  XANAX TAKE 1 TABLET BY MOUTH AS NEEDED FOR ANXIETY   amLODipine 5 MG tablet Commonly known as:  NORVASC TK 1 T PO D FOR HIGH BP   aspirin 81 MG tablet Take 81 mg by mouth daily.   docusate sodium 100 MG capsule Commonly known as:  COLACE Take 100 mg by mouth every morning.   HM MULTIVITAMIN ADULT GUMMY Chew Chew 2 tablets by mouth every morning.   lidocaine-prilocaine cream Commonly known as:  EMLA Apply to affected area  once   LORazepam 0.5 MG tablet Commonly known as:  ATIVAN Take 1 tablet (0.5 mg total) by mouth every 6 (six) hours as needed (Nausea or vomiting).   megestrol 40 MG/ML suspension Commonly known as:  MEGACE Take 10 mLs (400 mg total) by mouth daily.   Melatonin 5 MG Caps Take 5 mg by mouth at bedtime.   metoprolol succinate 25 MG 24 hr tablet Commonly known as:  TOPROL-XL TAKE 1 TABLET(25 MG) BY MOUTH EVERY MORNING   mirtazapine 15 MG disintegrating tablet Commonly known as:  REMERON SOL-TAB DISSOLVE 1 TABLET ON THE TONGUE AT BEDTIME   Naproxen Sodium 220 MG Caps Take by mouth.   omeprazole 20 MG capsule Commonly known as:  PRILOSEC TAKE ONE CAPSULE BY MOUTH TWICE DAILY BEFORE A MEAL   ondansetron 8 MG disintegrating tablet Commonly known as:  ZOFRAN-ODT Take 1 tablet (8 mg total) by mouth every 8 (eight) hours as needed for nausea or vomiting. Reported on 05/17/2015   PARoxetine 10 MG tablet Commonly known as:  PAXIL TAKE 1 TABLET BY MOUTH DAILY FOR DEPRESSION   [name] 0.625 MG tablet Generic drug:  estrogens (conjugated) TAKE 1 TABLET BY MOUTH EVERY DAY FOR 21 DAYS, THEN DO NOT TAKE FOR 7 DAYS   prochlorperazine  10 MG tablet Commonly known as:  COMPAZINE TAKE 1 TABLET(10 MG) BY MOUTH EVERY 6 HOURS AS NEEDED FOR NAUSEA OR VOMITING   SIMPLY SLEEP 25 MG tablet Generic drug:  diphenhydrAMINE Take 25 mg by mouth at bedtime as needed for sleep.   traMADol 50 MG tablet Commonly known as:  ULTRAM Take 1 tablet (50 mg total) by mouth every 6 (six) hours as needed.   traZODone 50 MG tablet Commonly known as:  DESYREL TAKE 1 TABLET(50 MG) BY MOUTH AT BEDTIME       Allergies:  Allergies  Allergen Reactions  . Morphine And Related Other (See Comments)    Hallucinations and combative  . Codeine Nausea Only    Past Medical History, Surgical history, Social history, and Family History were reviewed and updated.  Review of Systems: All other 10 point review of  systems is negative.   Physical Exam:  weight is 91 lb (41.3 kg). Her oral temperature is 98.1 F (36.7 C). Her blood pressure is 129/57 (abnormal) and her pulse is 99. Her respiration is 17 and oxygen saturation is 100%.   Wt Readings from Last 3 Encounters:  06/06/16 91 lb (41.3 kg)  05/16/16 88 lb (39.9 kg)  04/25/16 89 lb 8 oz (40.6 kg)    Ocular: Sclerae unicteric, pupils equal, round and reactive to light Ear-nose-throat: Oropharynx clear, dentition fair Lymphatic: No cervical supraclavicular or axillary adenopathy Lungs no rales or rhonchi, good excursion bilaterally Heart regular rate and rhythm, no murmur appreciated Abd soft, nontender, positive bowel sounds, no liver or spleen tip palpated on exam, no fluid wave MSK no focal spinal tenderness, no joint edema Neuro: non-focal, well-oriented, appropriate affect Breasts: Deferred  Lab Results  Component Value Date   WBC 12.1 (H) 06/06/2016   HGB 10.2 (L) 06/06/2016   HCT 31.6 (L) 06/06/2016   MCV 98 06/06/2016   PLT 378 06/06/2016   Lab Results  Component Value Date   FERRITIN 21 05/16/2016   IRON 68 05/16/2016   TIBC 365 05/16/2016   UIBC 297 05/16/2016   IRONPCTSAT 19 (L) 05/16/2016   Lab Results  Component Value Date   RBC 3.24 (L) 06/06/2016   No results found for: KPAFRELGTCHN, LAMBDASER, KAPLAMBRATIO No results found for: IGGSERUM, IGA, IGMSERUM No results found for: Odetta Pink, SPEI   Chemistry      Component Value Date/Time   NA 140 06/06/2016 1046   NA 136 03/07/2016 1055   K 4.3 06/06/2016 1046   K 4.1 03/07/2016 1055   CL 106 06/06/2016 1046   CO2 26 06/06/2016 1046   CO2 26 03/07/2016 1055   BUN 37 (H) 06/06/2016 1046   BUN 28.7 (H) 03/07/2016 1055   CREATININE 1.5 (H) 06/06/2016 1046   CREATININE 1.3 (H) 03/07/2016 1055      Component Value Date/Time   CALCIUM 9.0 06/06/2016 1046   CALCIUM 9.5 03/07/2016 1055   ALKPHOS 59  06/06/2016 1046   ALKPHOS 83 03/07/2016 1055   AST 25 06/06/2016 1046   AST 17 03/07/2016 1055   ALT 18 06/06/2016 1046   ALT 9 03/07/2016 1055   BILITOT 0.40 06/06/2016 1046   BILITOT 0.26 03/07/2016 1055     Impression and Plan: Marissa Cruz is a 61 to white female with locally advanced, inoperable, non-small cell lung cancer. She is not a candidate for chemotherapy, radiation therapy or surgery. Thankfully, she has a high PD-L1 expression and is candidate for immunotherapy. So  far she has done well and had a nice response noted on her PET scan last week.   I will give a dose of IV iron today. I think her iron studies at best are borderline low. She is on Prilosec so she will not absorb iron orally. I talked to she and her daughter about this. They are agreeable with IV iron.  We will give her fifth cycle of Keytruda today. After her sixth cycle, then we will repeat her PET scan.  I spent about 30 minutes with she and her daughter. I think that her platelet count coming down is also an indicator that she is responding.    Volanda Napoleon, MD 4/5/201811:49 AM

## 2016-06-06 NOTE — Progress Notes (Signed)
Reviewed pt labs with Dr. Marin Olp (CMP) and pt ok to treat with creatinine of 1.5

## 2016-06-06 NOTE — Patient Instructions (Addendum)
[facility] Discharge Instructions for Patients Receiving Chemotherapy  Today you received the following chemotherapy agents Keytruda and Feraheme.  To help prevent nausea and vomiting after your treatment, we encourage you to take your nausea medication as prescribed    If you develop nausea and vomiting that is not controlled by your nausea medication, call the clinic. If it is after clinic hours your family physician or the after hours number for the clinic or go to the Emergency Department.   BELOW ARE SYMPTOMS THAT SHOULD BE REPORTED IMMEDIATELY:  *FEVER GREATER THAN 100.5 F  *CHILLS WITH OR WITHOUT FEVER  NAUSEA AND VOMITING THAT IS NOT CONTROLLED WITH YOUR NAUSEA MEDICATION  *UNUSUAL SHORTNESS OF BREATH  *UNUSUAL BRUISING OR BLEEDING  TENDERNESS IN MOUTH AND THROAT WITH OR WITHOUT PRESENCE OF ULCERS  *URINARY PROBLEMS  *BOWEL PROBLEMS  UNUSUAL RASH Items with * indicate a potential emergency and should be followed up as soon as possible.  One of the nurses will contact you 24 hours after your treatment. Please let the nurse know about any problems that you may have experienced. Feel free to call the clinic you have any questions or concerns. The clinic phone number is [phone].

## 2016-06-07 ENCOUNTER — Telehealth: Payer: Self-pay | Admitting: *Deleted

## 2016-06-07 LAB — TSH CHCC: TSH: 4.4 u[IU]/mL (ref 0.450–4.500)

## 2016-06-07 LAB — FERRITIN CHCC: Ferritin: 40 ng/mL (ref 15–150)

## 2016-06-07 NOTE — Telephone Encounter (Signed)
-----   Message from Eliezer Bottom, NP sent at 06/07/2016  8:46 AM EDT ----- Regarding: Iron  Iron improving. No infusion needed at this time. Thank you!  Marissa Cruz  ----- Message ----- From: Interface, Lab In Three Zero One Sent: 06/06/2016  11:26 AM To: Eliezer Bottom, NP

## 2016-06-07 NOTE — Telephone Encounter (Signed)
Spoke with daughter stacey. No more iron needed at this time.

## 2016-06-07 NOTE — Telephone Encounter (Signed)
-----   Message from Eliezer Bottom, NP sent at 06/07/2016  8:46 AM EDT ----- Regarding: Iron  Iron improving. No infusion needed at this time. Thank you!  Sarah  ----- Message ----- From: Interface, Lab In Three Zero One Sent: 06/06/2016  11:26 AM To: Eliezer Bottom, NP

## 2016-06-14 ENCOUNTER — Other Ambulatory Visit: Payer: Self-pay | Admitting: Family Medicine

## 2016-06-18 ENCOUNTER — Other Ambulatory Visit: Payer: Self-pay | Admitting: Family Medicine

## 2016-06-18 NOTE — Telephone Encounter (Signed)
Last office visit on 03/05/2016 Last refill on 03/18/2016   #90 no refills

## 2016-06-27 ENCOUNTER — Ambulatory Visit (HOSPITAL_BASED_OUTPATIENT_CLINIC_OR_DEPARTMENT_OTHER): Payer: Medicare Other

## 2016-06-27 ENCOUNTER — Other Ambulatory Visit (HOSPITAL_BASED_OUTPATIENT_CLINIC_OR_DEPARTMENT_OTHER): Payer: Medicare Other

## 2016-06-27 ENCOUNTER — Ambulatory Visit: Payer: Medicare Other

## 2016-06-27 ENCOUNTER — Ambulatory Visit (HOSPITAL_BASED_OUTPATIENT_CLINIC_OR_DEPARTMENT_OTHER): Payer: Medicare Other | Admitting: Hematology & Oncology

## 2016-06-27 VITALS — HR 90

## 2016-06-27 VITALS — BP 119/64 | HR 101 | Temp 97.7°F | Resp 16 | Wt 91.4 lb

## 2016-06-27 DIAGNOSIS — C3431 Malignant neoplasm of lower lobe, right bronchus or lung: Secondary | ICD-10-CM

## 2016-06-27 DIAGNOSIS — K909 Intestinal malabsorption, unspecified: Secondary | ICD-10-CM

## 2016-06-27 DIAGNOSIS — D500 Iron deficiency anemia secondary to blood loss (chronic): Secondary | ICD-10-CM | POA: Diagnosis not present

## 2016-06-27 DIAGNOSIS — R918 Other nonspecific abnormal finding of lung field: Secondary | ICD-10-CM

## 2016-06-27 DIAGNOSIS — Z5112 Encounter for antineoplastic immunotherapy: Secondary | ICD-10-CM

## 2016-06-27 LAB — CBC WITH DIFFERENTIAL (CANCER CENTER ONLY)
BASO#: 0.1 10*3/uL (ref 0.0–0.2)
BASO%: 0.8 % (ref 0.0–2.0)
EOS%: 5 % (ref 0.0–7.0)
Eosinophils Absolute: 0.6 10*3/uL — ABNORMAL HIGH (ref 0.0–0.5)
HEMATOCRIT: 33 % — AB (ref 34.8–46.6)
HEMOGLOBIN: 10.7 g/dL — AB (ref 11.6–15.9)
LYMPH#: 1.6 10*3/uL (ref 0.9–3.3)
LYMPH%: 14.8 % (ref 14.0–48.0)
MCH: 32 pg (ref 26.0–34.0)
MCHC: 32.4 g/dL (ref 32.0–36.0)
MCV: 99 fL (ref 81–101)
MONO#: 1 10*3/uL — ABNORMAL HIGH (ref 0.1–0.9)
MONO%: 8.7 % (ref 0.0–13.0)
NEUT%: 70.7 % (ref 39.6–80.0)
NEUTROS ABS: 7.9 10*3/uL — AB (ref 1.5–6.5)
Platelets: 370 10*3/uL (ref 145–400)
RBC: 3.34 10*6/uL — AB (ref 3.70–5.32)
RDW: 15.3 % (ref 11.1–15.7)
WBC: 11.1 10*3/uL — AB (ref 3.9–10.0)

## 2016-06-27 LAB — CMP (CANCER CENTER ONLY)
ALBUMIN: 3.2 g/dL — AB (ref 3.3–5.5)
ALK PHOS: 75 U/L (ref 26–84)
ALT: 16 U/L (ref 10–47)
AST: 23 U/L (ref 11–38)
BILIRUBIN TOTAL: 0.4 mg/dL (ref 0.20–1.60)
BUN, Bld: 31 mg/dL — ABNORMAL HIGH (ref 7–22)
CALCIUM: 9.3 mg/dL (ref 8.0–10.3)
CO2: 26 mEq/L (ref 18–33)
CREATININE: 1.5 mg/dL — AB (ref 0.6–1.2)
Chloride: 106 mEq/L (ref 98–108)
Glucose, Bld: 124 mg/dL — ABNORMAL HIGH (ref 73–118)
Potassium: 4 mEq/L (ref 3.3–4.7)
SODIUM: 138 meq/L (ref 128–145)
TOTAL PROTEIN: 6.1 g/dL — AB (ref 6.4–8.1)

## 2016-06-27 MED ORDER — SODIUM CHLORIDE 0.9% FLUSH
10.0000 mL | INTRAVENOUS | Status: DC | PRN
  Administered 2016-06-27: 10 mL
  Filled 2016-06-27: qty 10

## 2016-06-27 MED ORDER — SODIUM CHLORIDE 0.9 % IV SOLN
Freq: Once | INTRAVENOUS | Status: AC
  Administered 2016-06-27: 14:00:00 via INTRAVENOUS

## 2016-06-27 MED ORDER — HEPARIN SOD (PORK) LOCK FLUSH 100 UNIT/ML IV SOLN
500.0000 [IU] | Freq: Once | INTRAVENOUS | Status: AC | PRN
  Administered 2016-06-27: 500 [IU]
  Filled 2016-06-27: qty 5

## 2016-06-27 MED ORDER — PEMBROLIZUMAB CHEMO INJECTION 100 MG/4ML
200.0000 mg | Freq: Once | INTRAVENOUS | Status: AC
  Administered 2016-06-27: 200 mg via INTRAVENOUS
  Filled 2016-06-27: qty 8

## 2016-06-27 NOTE — Patient Instructions (Signed)
[facility] Discharge Instructions for Patients Receiving Chemotherapy  Today you received the following chemotherapy agents: Keytruda   To help prevent nausea and vomiting after your treatment, we encourage you to take your nausea medication as directed    If you develop nausea and vomiting that is not controlled by your nausea medication, call the clinic.   BELOW ARE SYMPTOMS THAT SHOULD BE REPORTED IMMEDIATELY:  *FEVER GREATER THAN 100.5 F  *CHILLS WITH OR WITHOUT FEVER  NAUSEA AND VOMITING THAT IS NOT CONTROLLED WITH YOUR NAUSEA MEDICATION  *UNUSUAL SHORTNESS OF BREATH  *UNUSUAL BRUISING OR BLEEDING  TENDERNESS IN MOUTH AND THROAT WITH OR WITHOUT PRESENCE OF ULCERS  *URINARY PROBLEMS  *BOWEL PROBLEMS  UNUSUAL RASH Items with * indicate a potential emergency and should be followed up as soon as possible.  Feel free to call the clinic you have any questions or concerns. The clinic phone number is [phone].  Please show the CHEMO ALERT CARD at check-in to the Emergency Department and triage nurse.   

## 2016-06-27 NOTE — Progress Notes (Signed)
Hematology and Oncology Follow Up Visit  Marissa Cruz [id number] [birth year] 82 y.o. 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 She continues to do pretty well. We did give her iron last time she was here. Her ferritin was 40 with an iron saturation of 21%. Unfortunately, this really did not make much difference for her.  Her weight is about the same. She just does not eat a whole lot. We have her on some Megace.  She's had no issues with nausea or vomiting. She's had no change in bowel or bladder habits.  She's had no rashes. She's had some pruritus.  She's had no headache. She's had no bleeding. She's had no fever.   Overall, her performance status is ECOG 2.    Medications:  Allergies as of 06/27/2016      Reactions   Morphine And Related Other (See Comments)   Hallucinations and combative   Codeine Nausea Only      Medication List       Accurate as of 06/27/16  1:25 PM. Always use your most recent med list.          acetaminophen 650 MG CR tablet Commonly known as:  TYLENOL Take 1,300 mg by mouth at bedtime.   allopurinol 100 MG tablet Commonly known as:  ZYLOPRIM TAKE 1 TABLET BY MOUTH AT BEDTIME   ALPRAZolam 0.25 MG tablet Commonly known as:  XANAX TAKE 1 TABLET BY MOUTH AS NEEDED FOR ANXIETY   amLODipine 5 MG tablet Commonly known as:  NORVASC TK 1 T PO D FOR HIGH BP   aspirin 81 MG tablet Take 81 mg by mouth daily.   docusate sodium 100 MG capsule Commonly known as:  COLACE Take 100 mg by mouth every morning.   HM MULTIVITAMIN ADULT GUMMY Chew Chew 2 tablets by mouth every morning.   lidocaine-prilocaine cream Commonly  known as:  EMLA Apply to affected area once   LORazepam 0.5 MG tablet Commonly known as:  ATIVAN Take 1 tablet (0.5 mg total) by mouth every 6 (six) hours as needed (Nausea or vomiting).   megestrol 40 MG/ML suspension Commonly known as:  MEGACE Take 10 mLs (400 mg total) by mouth daily.   Melatonin 5 MG Caps Take 5 mg by mouth at bedtime.   metoprolol succinate 25 MG 24 hr tablet Commonly known as:  TOPROL-XL TAKE 1 TABLET(25 MG) BY MOUTH EVERY MORNING   mirtazapine 15 MG disintegrating tablet Commonly known as:  REMERON SOL-TAB DISSOLVE 1 TABLET ON THE TONGUE AT BEDTIME   Naproxen Sodium 220 MG Caps Take by mouth.   omeprazole 20 MG capsule Commonly known as:  PRILOSEC TAKE ONE CAPSULE BY MOUTH TWICE DAILY BEFORE A MEAL   ondansetron 8 MG disintegrating tablet Commonly known as:  ZOFRAN-ODT Take 1 tablet (8 mg total) by mouth every 8 (eight) hours as needed for nausea or vomiting. Reported on 05/17/2015   PARoxetine 10 MG tablet Commonly known as:  PAXIL TAKE 1 TABLET BY MOUTH DAILY FOR DEPRESSION   [name] 0.625 MG tablet Generic drug:  estrogens (conjugated) TAKE 1 TABLET BY MOUTH EVERY DAY FOR 21 DAYS, THEN DO NOT TAKE FOR 7 DAYS  prochlorperazine 10 MG tablet Commonly known as:  COMPAZINE TAKE 1 TABLET(10 MG) BY MOUTH EVERY 6 HOURS AS NEEDED FOR NAUSEA OR VOMITING   SIMPLY SLEEP 25 MG tablet Generic drug:  diphenhydrAMINE Take 25 mg by mouth at bedtime as needed for sleep.   traMADol 50 MG tablet Commonly known as:  ULTRAM Take 1 tablet (50 mg total) by mouth every 6 (six) hours as needed.   traZODone 50 MG tablet Commonly known as:  DESYREL TAKE 1 TABLET(50 MG) BY MOUTH AT BEDTIME       Allergies:  Allergies  Allergen Reactions  . Morphine And Related Other (See Comments)    Hallucinations and combative  . Codeine Nausea Only    Past Medical History, Surgical history, Social history, and Family History were reviewed and updated.  Review of  Systems: All other 10 point review of systems is negative.   Physical Exam:  weight is 91 lb 6.4 oz (41.5 kg). Her oral temperature is 97.7 F (36.5 C). Her blood pressure is 119/64 and her pulse is 101 (abnormal). Her respiration is 16 and oxygen saturation is 98%.   Wt Readings from Last 3 Encounters:  06/27/16 91 lb 6.4 oz (41.5 kg)  06/06/16 91 lb (41.3 kg)  05/16/16 88 lb (39.9 kg)    Ocular: Sclerae unicteric, pupils equal, round and reactive to light Ear-nose-throat: Oropharynx clear, dentition fair Lymphatic: No cervical supraclavicular or axillary adenopathy Lungs no rales or rhonchi, good excursion bilaterally Heart regular rate and rhythm, no murmur appreciated Abd soft, nontender, positive bowel sounds, no liver or spleen tip palpated on exam, no fluid wave MSK no focal spinal tenderness, no joint edema Neuro: non-focal, well-oriented, appropriate affect Breasts: Deferred  Lab Results  Component Value Date   WBC 11.1 (H) 06/27/2016   HGB 10.7 (L) 06/27/2016   HCT 33.0 (L) 06/27/2016   MCV 99 06/27/2016   PLT 370 06/27/2016   Lab Results  Component Value Date   FERRITIN 40 06/06/2016   IRON 75 06/06/2016   TIBC 364 06/06/2016   UIBC 289 06/06/2016   IRONPCTSAT 21 06/06/2016   Lab Results  Component Value Date   RBC 3.34 (L) 06/27/2016   No results found for: KPAFRELGTCHN, LAMBDASER, KAPLAMBRATIO No results found for: IGGSERUM, IGA, IGMSERUM No results found for: Odetta Pink, SPEI   Chemistry      Component Value Date/Time   NA 138 06/27/2016 1200   NA 136 03/07/2016 1055   K 4.0 06/27/2016 1200   K 4.1 03/07/2016 1055   CL 106 06/27/2016 1200   CO2 26 06/27/2016 1200   CO2 26 03/07/2016 1055   BUN 31 (H) 06/27/2016 1200   BUN 28.7 (H) 03/07/2016 1055   CREATININE 1.5 (H) 06/27/2016 1200   CREATININE 1.3 (H) 03/07/2016 1055      Component Value Date/Time   CALCIUM 9.3 06/27/2016 1200    CALCIUM 9.5 03/07/2016 1055   ALKPHOS 75 06/27/2016 1200   ALKPHOS 83 03/07/2016 1055   AST 23 06/27/2016 1200   AST 17 03/07/2016 1055   ALT 16 06/27/2016 1200   ALT 9 03/07/2016 1055   BILITOT 0.40 06/27/2016 1200   BILITOT 0.26 03/07/2016 1055     Impression and Plan: Marissa Cruz is a 53 to white female with locally advanced, inoperable, non-small cell lung cancer. She is not a candidate for chemotherapy, radiation therapy or surgery. Thankfully, she has a high PD-L1 expression and is candidate for  immunotherapy. So far she has done well and had a nice response noted on her PET scan.   I wish that the iron that we gave her would've helped a little bit more.  We will go ahead and get her set up with another PET scan after this cycle of treatment.  Because of her daughter's family, we have to adjust the treatment schedule for Marissa Cruz. We will move her cream is back an extra week.  We will see her back in one month.    Volanda Napoleon, MD 4/26/20181:25 PM

## 2016-06-28 LAB — IRON AND TIBC
%SAT: 33 % (ref 21–57)
IRON: 89 ug/dL (ref 41–142)
TIBC: 269 ug/dL (ref 236–444)
UIBC: 180 ug/dL (ref 120–384)

## 2016-06-28 LAB — FERRITIN: FERRITIN: 246 ng/mL (ref 9–269)

## 2016-06-30 ENCOUNTER — Other Ambulatory Visit: Payer: Self-pay | Admitting: Family Medicine

## 2016-07-13 ENCOUNTER — Encounter (HOSPITAL_BASED_OUTPATIENT_CLINIC_OR_DEPARTMENT_OTHER): Payer: Self-pay | Admitting: *Deleted

## 2016-07-13 ENCOUNTER — Inpatient Hospital Stay (HOSPITAL_BASED_OUTPATIENT_CLINIC_OR_DEPARTMENT_OTHER)
Admission: EM | Admit: 2016-07-13 | Discharge: 2016-07-16 | DRG: 378 | Disposition: A | Discharge status: Home / Self Care | Payer: Medicare Other | Attending: Internal Medicine | Admitting: Internal Medicine

## 2016-07-13 DIAGNOSIS — K5520 Angiodysplasia of colon without hemorrhage: Secondary | ICD-10-CM

## 2016-07-13 DIAGNOSIS — D500 Iron deficiency anemia secondary to blood loss (chronic): Secondary | ICD-10-CM | POA: Diagnosis present

## 2016-07-13 DIAGNOSIS — R197 Diarrhea, unspecified: Secondary | ICD-10-CM | POA: Diagnosis not present

## 2016-07-13 DIAGNOSIS — K625 Hemorrhage of anus and rectum: Secondary | ICD-10-CM | POA: Diagnosis not present

## 2016-07-13 DIAGNOSIS — K5521 Angiodysplasia of colon with hemorrhage: Principal | ICD-10-CM | POA: Diagnosis present

## 2016-07-13 DIAGNOSIS — Z681 Body mass index (BMI) 19 or less, adult: Secondary | ICD-10-CM | POA: Diagnosis not present

## 2016-07-13 DIAGNOSIS — K921 Melena: Secondary | ICD-10-CM

## 2016-07-13 DIAGNOSIS — Z993 Dependence on wheelchair: Secondary | ICD-10-CM

## 2016-07-13 DIAGNOSIS — N184 Chronic kidney disease, stage 4 (severe): Secondary | ICD-10-CM | POA: Diagnosis present

## 2016-07-13 DIAGNOSIS — Z923 Personal history of irradiation: Secondary | ICD-10-CM

## 2016-07-13 DIAGNOSIS — Z8673 Personal history of transient ischemic attack (TIA), and cerebral infarction without residual deficits: Secondary | ICD-10-CM

## 2016-07-13 DIAGNOSIS — C3490 Malignant neoplasm of unspecified part of unspecified bronchus or lung: Secondary | ICD-10-CM | POA: Diagnosis present

## 2016-07-13 DIAGNOSIS — K219 Gastro-esophageal reflux disease without esophagitis: Secondary | ICD-10-CM | POA: Diagnosis present

## 2016-07-13 DIAGNOSIS — M109 Gout, unspecified: Secondary | ICD-10-CM | POA: Diagnosis present

## 2016-07-13 DIAGNOSIS — I10 Essential (primary) hypertension: Secondary | ICD-10-CM | POA: Diagnosis not present

## 2016-07-13 DIAGNOSIS — D649 Anemia, unspecified: Secondary | ICD-10-CM | POA: Diagnosis present

## 2016-07-13 DIAGNOSIS — D62 Acute posthemorrhagic anemia: Secondary | ICD-10-CM

## 2016-07-13 DIAGNOSIS — I129 Hypertensive chronic kidney disease with stage 1 through stage 4 chronic kidney disease, or unspecified chronic kidney disease: Secondary | ICD-10-CM | POA: Diagnosis present

## 2016-07-13 DIAGNOSIS — K2971 Gastritis, unspecified, with bleeding: Secondary | ICD-10-CM

## 2016-07-13 DIAGNOSIS — Z66 Do not resuscitate: Secondary | ICD-10-CM | POA: Diagnosis present

## 2016-07-13 DIAGNOSIS — E440 Moderate protein-calorie malnutrition: Secondary | ICD-10-CM | POA: Diagnosis not present

## 2016-07-13 DIAGNOSIS — F419 Anxiety disorder, unspecified: Secondary | ICD-10-CM | POA: Diagnosis present

## 2016-07-13 DIAGNOSIS — K922 Gastrointestinal hemorrhage, unspecified: Secondary | ICD-10-CM

## 2016-07-13 DIAGNOSIS — C3431 Malignant neoplasm of lower lobe, right bronchus or lung: Secondary | ICD-10-CM | POA: Diagnosis present

## 2016-07-13 DIAGNOSIS — F329 Major depressive disorder, single episode, unspecified: Secondary | ICD-10-CM | POA: Diagnosis present

## 2016-07-13 DIAGNOSIS — K909 Intestinal malabsorption, unspecified: Secondary | ICD-10-CM | POA: Diagnosis not present

## 2016-07-13 DIAGNOSIS — Z8521 Personal history of malignant neoplasm of larynx: Secondary | ICD-10-CM

## 2016-07-13 DIAGNOSIS — Z79899 Other long term (current) drug therapy: Secondary | ICD-10-CM

## 2016-07-13 DIAGNOSIS — J449 Chronic obstructive pulmonary disease, unspecified: Secondary | ICD-10-CM | POA: Diagnosis present

## 2016-07-13 DIAGNOSIS — K5730 Diverticulosis of large intestine without perforation or abscess without bleeding: Secondary | ICD-10-CM | POA: Diagnosis present

## 2016-07-13 DIAGNOSIS — Z7982 Long term (current) use of aspirin: Secondary | ICD-10-CM

## 2016-07-13 LAB — PROTIME-INR
INR: 1
Prothrombin Time: 13.2 seconds (ref 11.4–15.2)

## 2016-07-13 LAB — COMPREHENSIVE METABOLIC PANEL
ALT: 13 U/L — AB (ref 14–54)
AST: 23 U/L (ref 15–41)
Albumin: 3.1 g/dL — ABNORMAL LOW (ref 3.5–5.0)
Alkaline Phosphatase: 71 U/L (ref 38–126)
Anion gap: 9 (ref 5–15)
BUN: 33 mg/dL — ABNORMAL HIGH (ref 6–20)
CHLORIDE: 108 mmol/L (ref 101–111)
CO2: 21 mmol/L — AB (ref 22–32)
CREATININE: 1.65 mg/dL — AB (ref 0.44–1.00)
Calcium: 8.7 mg/dL — ABNORMAL LOW (ref 8.9–10.3)
GFR calc non Af Amer: 28 mL/min — ABNORMAL LOW (ref >60)
GFR, EST AFRICAN AMERICAN: 33 mL/min — AB (ref >60)
Glucose, Bld: 141 mg/dL — ABNORMAL HIGH (ref 65–99)
POTASSIUM: 3.7 mmol/L (ref 3.5–5.1)
SODIUM: 138 mmol/L (ref 135–145)
Total Bilirubin: 0.2 mg/dL — ABNORMAL LOW (ref 0.3–1.2)
Total Protein: 6.2 g/dL — ABNORMAL LOW (ref 6.5–8.1)

## 2016-07-13 LAB — CBC WITH DIFFERENTIAL/PLATELET
Basophils Absolute: 0.1 10*3/uL (ref 0.0–0.1)
Basophils Relative: 1 %
Eosinophils Absolute: 0.5 10*3/uL (ref 0.0–0.7)
Eosinophils Relative: 3 %
HEMATOCRIT: 29.7 % — AB (ref 36.0–46.0)
HEMOGLOBIN: 9.7 g/dL — AB (ref 12.0–15.0)
LYMPHS ABS: 1.8 10*3/uL (ref 0.7–4.0)
Lymphocytes Relative: 12 %
MCH: 32.1 pg (ref 26.0–34.0)
MCHC: 32.7 g/dL (ref 30.0–36.0)
MCV: 98.3 fL (ref 78.0–100.0)
MONOS PCT: 8 %
Monocytes Absolute: 1.3 10*3/uL — ABNORMAL HIGH (ref 0.1–1.0)
NEUTROS ABS: 11.9 10*3/uL — AB (ref 1.7–7.7)
NEUTROS PCT: 77 %
Platelets: 387 10*3/uL (ref 150–400)
RBC: 3.02 MIL/uL — AB (ref 3.87–5.11)
RDW: 14.3 % (ref 11.5–15.5)
WBC: 15.5 10*3/uL — AB (ref 4.0–10.5)

## 2016-07-13 LAB — HEMOGLOBIN AND HEMATOCRIT, BLOOD
HEMATOCRIT: 25.5 % — AB (ref 36.0–46.0)
HEMOGLOBIN: 8.3 g/dL — AB (ref 12.0–15.0)

## 2016-07-13 MED ORDER — ALPRAZOLAM 0.25 MG PO TABS
0.2500 mg | ORAL_TABLET | Freq: Two times a day (BID) | ORAL | Status: DC | PRN
Start: 2016-07-13 — End: 2016-07-16

## 2016-07-13 MED ORDER — TRAZODONE HCL 50 MG PO TABS
50.0000 mg | ORAL_TABLET | Freq: Every day | ORAL | Status: DC
  Administered 2016-07-13 – 2016-07-15 (×3): 50 mg via ORAL
  Filled 2016-07-13 (×3): qty 1

## 2016-07-13 MED ORDER — ACETAMINOPHEN 325 MG PO TABS: 650.0000 mg | ORAL_TABLET | Freq: Four times a day (QID) | ORAL | Status: DC | PRN

## 2016-07-13 MED ORDER — SODIUM CHLORIDE 0.9% FLUSH
10.0000 mL | INTRAVENOUS | Status: DC | PRN
  Administered 2016-07-14: 10 mL
  Filled 2016-07-13: qty 40

## 2016-07-13 MED ORDER — AMLODIPINE BESYLATE 5 MG PO TABS
5.0000 mg | ORAL_TABLET | Freq: Every day | ORAL | Status: DC
  Administered 2016-07-14 – 2016-07-16 (×3): 5 mg via ORAL
  Filled 2016-07-13 (×3): qty 1

## 2016-07-13 MED ORDER — MIRTAZAPINE 15 MG PO TBDP
15.0000 mg | ORAL_TABLET | Freq: Every day | ORAL | Status: DC
  Administered 2016-07-13 – 2016-07-15 (×3): 15 mg via ORAL
  Filled 2016-07-13 (×3): qty 1

## 2016-07-13 MED ORDER — PANTOPRAZOLE SODIUM 40 MG PO TBEC
40.0000 mg | DELAYED_RELEASE_TABLET | Freq: Every day | ORAL | Status: DC
  Administered 2016-07-14 – 2016-07-16 (×3): 40 mg via ORAL
  Filled 2016-07-13 (×3): qty 1

## 2016-07-13 MED ORDER — SODIUM CHLORIDE 0.9 % IV SOLN
INTRAVENOUS | Status: DC
  Administered 2016-07-13 – 2016-07-16 (×5): via INTRAVENOUS

## 2016-07-13 MED ORDER — SODIUM CHLORIDE 0.9 % IV BOLUS (SEPSIS)
1000.0000 mL | Freq: Once | INTRAVENOUS | Status: AC
  Administered 2016-07-13: 1000 mL via INTRAVENOUS

## 2016-07-13 MED ORDER — ALLOPURINOL 100 MG PO TABS
100.0000 mg | ORAL_TABLET | Freq: Every day | ORAL | Status: DC
  Administered 2016-07-13 – 2016-07-15 (×3): 100 mg via ORAL
  Filled 2016-07-13 (×3): qty 1

## 2016-07-13 MED ORDER — FERROUS SULFATE 325 (65 FE) MG PO TABS
325.0000 mg | ORAL_TABLET | Freq: Two times a day (BID) | ORAL | Status: DC
  Administered 2016-07-14 – 2016-07-16 (×5): 325 mg via ORAL
  Filled 2016-07-13 (×5): qty 1

## 2016-07-13 MED ORDER — PAROXETINE HCL 20 MG PO TABS
10.0000 mg | ORAL_TABLET | Freq: Every day | ORAL | Status: DC
  Administered 2016-07-14 – 2016-07-16 (×3): 10 mg via ORAL
  Filled 2016-07-13 (×3): qty 1

## 2016-07-13 MED ORDER — ACETAMINOPHEN 650 MG RE SUPP: 650.0000 mg | Freq: Four times a day (QID) | RECTAL | Status: DC | PRN

## 2016-07-13 MED ORDER — ONDANSETRON HCL 4 MG/2ML IJ SOLN
4.0000 mg | Freq: Four times a day (QID) | INTRAMUSCULAR | Status: DC | PRN
  Administered 2016-07-14: 4 mg via INTRAVENOUS
  Filled 2016-07-13: qty 2

## 2016-07-13 MED ORDER — ONDANSETRON HCL 4 MG PO TABS: 4.0000 mg | ORAL_TABLET | Freq: Four times a day (QID) | ORAL | Status: DC | PRN

## 2016-07-13 MED ORDER — SODIUM CHLORIDE 0.9% FLUSH
3.0000 mL | Freq: Two times a day (BID) | INTRAVENOUS | Status: DC
  Administered 2016-07-13 – 2016-07-15 (×4): 3 mL via INTRAVENOUS

## 2016-07-13 MED ORDER — NICOTINE 14 MG/24HR TD PT24
14.0000 mg | MEDICATED_PATCH | Freq: Every day | TRANSDERMAL | Status: DC
  Administered 2016-07-14 – 2016-07-16 (×3): 14 mg via TRANSDERMAL
  Filled 2016-07-13 (×3): qty 1

## 2016-07-13 MED ORDER — METOPROLOL SUCCINATE ER 25 MG PO TB24
25.0000 mg | ORAL_TABLET | Freq: Every day | ORAL | Status: DC
  Administered 2016-07-14 – 2016-07-16 (×3): 25 mg via ORAL
  Filled 2016-07-13 (×3): qty 1

## 2016-07-13 MED ORDER — MEGESTROL ACETATE 40 MG/ML PO SUSP
400.0000 mg | Freq: Every day | ORAL | Status: DC
  Administered 2016-07-13 – 2016-07-16 (×4): 400 mg via ORAL
  Filled 2016-07-13 (×4): qty 10

## 2016-07-13 NOTE — H&P (Signed)
History and Physical    Layanna Charo IRC:[id number] DOB: 26-Aug-1934 DOA: 07/13/2016  PCP: Carollee Herter, Alferd Apa, DO  ONCOLOGY: Ennever  Patient coming from: [facility]  Chief Complaint: BRBPR  HPI: Marissa Cruz is a 82 y.o. woman with a history of NSCLC (undergoing active treatment with Dr. Marin Olp), TIA, iron deficiency anemia due to malabsorption, moderate protein calorie malnutrition, HTN, GERD, and anxiety/depression who presented to the ED in [facility] for evaluation of BRBPR, onset earlier today.  The patient developed diarrhea at home and discovered that she was also passing BRBPR.  She does not recall seeing blood clots.  She does not recall if the stools were maroon.  She describes persistent symptoms for at least one hour.  She had one episode of nausea and nonbloody emesis.  No significant abdominal pain.  She became light-headed and dizzy, but she did not have syncope.  No fever.  She called her daughter, who took her to the ED.  She has history of small hemorrhoids.  She has had a single episode of diverticulitis, 40 years ago.  Last colonoscopy with Dr. Fuller Plan in 2015.  Multiple AVMs in the ascending colon and at the cecum were found.  Polyps were also removed.  She denies any history of GI bleed prior to today.    She takes Aleve intermittently for pain.  ED Course:  WBC count 15.5.  Hgb 9.7 (baseline appears to be around 11).  Bicarb 21.  BUN 33.  Creatinine 1.65 (baseline around 1.4).  She received 1L NS.  She has not had a recurrence of her symptoms since presentation to the ED.  Hospitalist asked to place in observation.  Review of Systems: As per HPI otherwise 10 systems reviewed and negative.   Past Medical History:  Diagnosis Date  . Age-related macular degeneration, dry, right eye   . Age-related macular degeneration, wet, left eye ([place])   . Anemia   . Anxiety   . Arthritis    "thumbs, hands" (08/18/2014)  . CAP (community acquired pneumonia) 08/19/2014  . Carotid  artery disease ([place])   . Chicken pox   . Chronic lower back pain   . Colon polyp   . Depression   . Diverticulitis   . GERD (gastroesophageal reflux disease)   . Goals of care, counseling/discussion 03/07/2016  . History of blood transfusion 1976; 2013   "w/hysterectomy; hip OR"  . History of gout   . Hypertension   . Iron deficiency anemia due to chronic blood loss 06/06/2016  . 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 Kidney disease   . 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 TIA (transient ischemic attack) ~ 2009 X 2  . Vocal cord cancer ([place]) ~ 2009   S/P radiation    Past Surgical History:  Procedure Laterality Date  . ABDOMINAL HYSTERECTOMY  1976  . APPENDECTOMY  1954  . CAROTID ENDARTERECTOMY Left ~ 2014  . CATARACT EXTRACTION W/ INTRAOCULAR LENS  IMPLANT, BILATERAL Bilateral ~ 2009  . COLONOSCOPY N/A 01/31/2014   Procedure: COLONOSCOPY;  Surgeon: Ladene Artist, MD;  Location: [facility] ENDOSCOPY;  Service: Endoscopy;  Laterality: N/A;  . DILATION AND CURETTAGE OF UTERUS    . IR GENERIC HISTORICAL  03/12/2016   IR US GUIDE VASC ACCESS RIGHT 03/12/2016 Jacqulynn Cadet, MD [facility]-INTERV RAD  . IR GENERIC HISTORICAL  03/12/2016   IR FLUORO GUIDE PORT INSERTION RIGHT 03/12/2016 Jacqulynn Cadet, MD [facility]  . JOINT REPLACEMENT    .  MOUTH FLOOR MASS EXCISION  03/2014   "removed cancer underneath my tongue"  . TONSILLECTOMY  1941  . TOTAL HIP ARTHROPLASTY Left 2013     reports that she has quit smoking. Her smoking use included Cigarettes. She has a 60.00 pack-year smoking history. She has never used smokeless tobacco. She reports that she drinks about 8.4 oz of alcohol per week . She reports that she does not use drugs.  She stopped smoking two months ago.  She is wearing a nicotine patch.  She has a cocktail every night before dinner.  She denies any history of EtOH withdrawal.  No recreational drug use.  She is a widow.  She has one adult daughter who is her  POA/NOK.  Allergies  Allergen Reactions  . Morphine And Related Other (See Comments)    Hallucinations and combative  . Codeine Nausea Only    Family History  Problem Relation Age of Onset  . Hypertension Father   . Skin cancer Father   . Emphysema Paternal Grandfather   . Liver cancer Mother   . Stomach cancer Maternal Grandfather      Prior to Admission medications   Medication Sig Start Date End Date Taking? Authorizing Provider  allopurinol (ZYLOPRIM) 100 MG tablet TAKE 1 TABLET BY MOUTH AT BEDTIME 07/01/16  Yes Lowne Chase, Yvonne R, DO  ALPRAZolam Duanne Moron) 0.25 MG tablet TAKE 1 TABLET BY MOUTH AS NEEDED FOR ANXIETY 04/02/16  Yes Ann Held, DO  amLODipine (NORVASC) 5 MG tablet take '5mg'$  by mouth daily 03/06/16  Yes [provider]  aspirin 81 MG tablet Take 81 mg by mouth daily.   Yes [provider]  cetirizine (ZYRTEC) 10 MG tablet Take 10 mg by mouth at bedtime.   Yes [provider]  docusate sodium (COLACE) 100 MG capsule Take 100 mg by mouth 2 (two) times daily.    Yes [provider]  ferrous sulfate 325 (65 FE) MG EC tablet Take 325 mg by mouth 2 (two) times daily.   Yes [provider]  lidocaine-prilocaine (EMLA) cream Apply to affected area once 03/13/16  Yes Ennever, Rudell Cobb, MD  LORazepam (ATIVAN) 0.5 MG tablet Take 1 tablet (0.5 mg total) by mouth every 6 (six) hours as needed (Nausea or vomiting). 03/13/16  Yes Volanda Napoleon, MD  megestrol (MEGACE) 40 MG/ML suspension Take 10 mLs (400 mg total) by mouth daily. 03/07/16  Yes Volanda Napoleon, MD  metoprolol succinate (TOPROL-XL) 25 MG 24 hr tablet TAKE 1 TABLET(25 MG) BY MOUTH EVERY MORNING 05/29/16  Yes Roma Schanz R, DO  mirtazapine (REMERON SOL-TAB) 15 MG disintegrating tablet DISSOLVE 1 TABLET ON THE TONGUE AT BEDTIME 06/14/16  Yes Mosie Lukes, MD  Multiple Vitamins-Minerals (HM MULTIVITAMIN ADULT GUMMY) CHEW Chew 2 tablets by mouth every morning.    Yes [provider]  nicotine (NICODERM CQ - DOSED IN MG/24 HOURS) 14 mg/24hr patch Place 14 mg onto the skin daily.   Yes [provider]  omeprazole (PRILOSEC) 20 MG capsule TAKE ONE CAPSULE BY MOUTH TWICE DAILY BEFORE A MEAL 08/25/15  Yes Carollee Herter, Yvonne R, DO  ondansetron (ZOFRAN-ODT) 8 MG disintegrating tablet Take 1 tablet (8 mg total) by mouth every 8 (eight) hours as needed for nausea or vomiting. Reported on 05/17/2015 03/05/16  Yes Roma Schanz R, DO  PARoxetine (PAXIL) 10 MG tablet TAKE 1 TABLET BY MOUTH DAILY FOR DEPRESSION 05/29/16  Yes Ann Held, DO  [name] 0.625 MG tablet TAKE 1 TABLET BY MOUTH EVERY DAY FOR 21 DAYS, THEN DO NOT TAKE FOR 7 DAYS Patient taking differently: take .'625mg'$  by mouth daily 06/04/16  Yes Roma Schanz R, DO  traZODone (DESYREL) 50 MG tablet TAKE 1 TABLET(50 MG) BY MOUTH AT BEDTIME 06/18/16  Yes Mosie Lukes, MD    Physical Exam: Vitals:   07/13/16 1512 07/13/16 1530 07/13/16 1600 07/13/16 1757  BP: 123/60 (!) 135/57 137/60 (!) 156/87  Pulse: 92 91 91 (!) 109  Resp: (!) 23 (!) '21 18 20  '$ Temp:    99.1 F (37.3 C)  TempSrc:    Oral  SpO2: 98% 97% 96% 99%  Weight:    41.4 kg (91 lb 4.3 oz)  Height:    '5\' 7"'$  (1.702 m)      Constitutional: NAD, calm, comfortable, NONtoxic appearing Vitals:   07/13/16 1512 07/13/16 1530 07/13/16 1600 07/13/16 1757  BP: 123/60 (!) 135/57 137/60 (!) 156/87  Pulse: 92 91 91 (!) 109  Resp: (!) 23 (!) '21 18 20  '$ Temp:    99.1 F (37.3 C)  TempSrc:    Oral  SpO2: 98% 97% 96% 99%  Weight:    41.4 kg (91 lb 4.3 oz)  Height:    '5\' 7"'$  (1.702 m)   Eyes: PERRL, lids and conjunctivae normal ENMT: Mucous membranes are moist. Posterior pharynx clear of any exudate or lesions. Normal dentition.  Neck: normal appearance, supple, no masses Respiratory: clear to auscultation bilaterally, no wheezing, no crackles. Normal respiratory effort. No accessory muscle use.  Cardiovascular:  Normal rate, regular rhythm, no murmurs / rubs / gallops. No extremity edema. 2+ pedal pulses. GI: abdomen is soft and compressible.  No distention.  No tenderness.  Bowel sounds are present. Musculoskeletal:  No joint deformity in upper and lower extremities. Good ROM, no contractures. Normal muscle tone.  Skin: no rashes but several bruises present, warm and dry Neurologic: CN 2-12 grossly intact. Sensation intact, Strength symmetric bilaterally. Psychiatric: Normal judgment and insight. Alert and oriented x 3. Normal mood.     Labs on Admission: I have personally reviewed following labs and imaging studies  CBC:  Recent Labs Lab 07/13/16 1329  WBC 15.5*  NEUTROABS 11.9*  HGB 9.7*  HCT 29.7*  MCV 98.3  PLT 333   Basic Metabolic Panel:  Recent Labs Lab 07/13/16 1329  NA 138  K 3.7  CL 108  CO2 21*  GLUCOSE 141*  BUN 33*  CREATININE 1.65*  CALCIUM 8.7*   GFR: Estimated Creatinine Clearance: 18.1 mL/min (A) (by C-G formula based on SCr of 1.65 mg/dL (H)). Liver Function Tests:  Recent Labs Lab 07/13/16 1329  AST 23  ALT 13*  ALKPHOS 71  BILITOT 0.2*  PROT 6.2*  ALBUMIN 3.1*   Coagulation Profile:  Recent Labs Lab 07/13/16 1329  INR 1.00     Assessment/Plan Principal Problem:   Rectal bleeding Active Problems:   Anemia   CKD (chronic kidney disease) stage 4, GFR 15-29 ml/min (HCC)   Malignant neoplasm of lower lobe of right lung (HCC)   Iron deficiency anemia due to chronic blood loss   GI bleed   Diarrhea      Acute lower GI bleeding; history of colon AVMs. --Monitor for ongoing blood loss, hemodynamic instability, and transfusion requirement --H/H q6h  --Type and screen now --NS at 100cc/hr --HOLD aspirin, premarin for now --Will need to call [facility] in the AM.  Clear liquid diet for  now.  NPO after midnight.  Acute on chronic anemia due to blood loss --Monitor for transfusion requirement --Continue iron  supplement  HTN --Continue amlodipine and metoprolol for now  CKD 4, creatinine slightly up from baseline --Hydrate and monitor  Gout --Allopurinol  Anxiety/depression --PRN xanax --Remeron, paxil, trazodone  GERD --On PPI at baseline  Recent tobacco cessation --Nicotine patch  Moderate protein calorie malnutrition --Megace supplement   DVT prophylaxis: SCDs, active bleeding Code Status: DNR (She demonstrates insight into her medical condition and appears competent to make this decision for herself.  She has been advised that she can change her code status at any time). Family Communication: Patient alone at time of admission. Disposition Plan: Expect she will go home at discharge. Consults called: Will need to call [facility] in the AM (patient known to Dr. Fuller Plan) Admission status: Observation, telemetry monitoring   TIME SPENT: 50 minutes   Eber Jones MD [facility] Pager [phone]  If 7PM-7AM, please contact night-coverage [email or web address] Password [email or web address]  07/13/2016, 8:01 PM

## 2016-07-13 NOTE — ED Triage Notes (Signed)
Pt c/o bright red rectal bleeding x 3 hrs

## 2016-07-13 NOTE — ED Notes (Signed)
Warm blankets given. VSS.

## 2016-07-13 NOTE — Progress Notes (Signed)
Request for transfer to [facility]. Pt presented to [facility] with main concern of lower GI bleed, ? Diverticular. ? Diarrheal episode with bright blood in stool. Last colonoscopy in 2015, + for AVM. Her Hg here is 9.7. Transfer to telemetry unit due to initial tachycardia (thought to be reactive from GI bleed).   Faye Ramsay, MD  [facility] Pager [phone]  If 7PM-7AM, please contact night-coverage [email or web address] Password [email or web address]

## 2016-07-13 NOTE — ED Notes (Signed)
ED Provider at bedside. 

## 2016-07-13 NOTE — ED Notes (Signed)
Report called to floor. Spoke with Marolyn Hammock, RN.

## 2016-07-13 NOTE — ED Provider Notes (Signed)
[facility] DEPT [facility] Provider Note   CSN: [id number] Arrival date & time: 07/13/16  1251     History   Chief Complaint Chief Complaint  Patient presents with  . Rectal Bleeding    HPI Marissa Cruz is a 82 y.o. female.  HPI   Presents with rectal bleeding and lightheadedness. Had episode of bright red blood, large amount of bleeding, also with diarrhea. Not sure if clots, difficult to discern with diarrhea. Had 3 large episodes of rectal bleeding.  Feels lightheaded. No syncope, no CP, no dyspnea.  No hx of rectal bleeding in past, has chronic anemia. No nausea or vomiting. No abdominal pain.    Past Medical History:  Diagnosis Date  . Age-related macular degeneration, dry, right eye   . Age-related macular degeneration, wet, left eye ([place])   . Anemia   . Anxiety   . Arthritis    "thumbs, hands" (08/18/2014)  . CAP (community acquired pneumonia) 08/19/2014  . Carotid artery disease ([place])   . Chicken pox   . Chronic lower back pain   . Colon polyp   . Depression   . Diverticulitis   . GERD (gastroesophageal reflux disease)   . Goals of care, counseling/discussion 03/07/2016  . History of blood transfusion 1976; 2013   "w/hysterectomy; hip OR"  . History of gout   . Hypertension   . Iron deficiency anemia due to chronic blood loss 06/06/2016  . 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 Kidney disease   . 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 TIA (transient ischemic attack) ~ 2009 X 2  . Vocal cord cancer ([place]) ~ 2009   S/P radiation    Patient Active Problem List   Diagnosis Date Noted  . GI bleed 07/13/2016  . Rectal bleeding 07/13/2016  . Diarrhea 07/13/2016  . Iron deficiency anemia due to chronic blood loss 06/06/2016  . 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 Goals of care, counseling/discussion 03/07/2016  . Malignant neoplasm of lower lobe of right lung ([place]) 02/08/2016  . Lung mass 12/28/2015  . Protein-calorie malnutrition ([place])  08/20/2014  . Pressure ulcer 08/20/2014  . CAP (community acquired pneumonia) 08/19/2014  . Tobacco use disorder 08/19/2014  . CKD (chronic kidney disease) stage 4, GFR 15-29 ml/min (HCC) 08/19/2014  . Occult blood in stools 01/31/2014  . Anemia 01/31/2014  . Benign neoplasm of transverse colon 01/31/2014  . Hematuria 01/07/2014  . Leukocytes in urine 01/07/2014  . Acute bronchitis 01/07/2014  . Back pain 01/07/2014  . 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 HTN (hypertension) 10/21/2013  . Generalized anxiety disorder 10/21/2013  . Gout 10/21/2013  . GERD (gastroesophageal reflux disease) 10/21/2013    Past Surgical History:  Procedure Laterality Date  . ABDOMINAL HYSTERECTOMY  1976  . APPENDECTOMY  1954  . CAROTID ENDARTERECTOMY Left ~ 2014  . CATARACT EXTRACTION W/ INTRAOCULAR LENS  IMPLANT, BILATERAL Bilateral ~ 2009  . COLONOSCOPY N/A 01/31/2014   Procedure: COLONOSCOPY;  Surgeon: Ladene Artist, MD;  Location: [facility];  Service: Endoscopy;  Laterality: N/A;  . DILATION AND CURETTAGE OF UTERUS    . IR GENERIC HISTORICAL  03/12/2016   IR US GUIDE VASC ACCESS RIGHT 03/12/2016 Jacqulynn Cadet, MD [facility]  . IR GENERIC HISTORICAL  03/12/2016   IR FLUORO GUIDE PORT INSERTION RIGHT 03/12/2016 Jacqulynn Cadet, MD [facility]  . JOINT REPLACEMENT    . MOUTH FLOOR MASS EXCISION  03/2014   "removed cancer underneath my tongue"  .  TONSILLECTOMY  1941  . TOTAL HIP ARTHROPLASTY Left 2013    OB History    No data available       Home Medications    Prior to Admission medications   Medication Sig Start Date End Date Taking? Authorizing Provider  allopurinol (ZYLOPRIM) 100 MG tablet TAKE 1 TABLET BY MOUTH AT BEDTIME 07/01/16  Yes Lowne Chase, Yvonne R, DO  ALPRAZolam Duanne Moron) 0.25 MG tablet TAKE 1 TABLET BY MOUTH AS NEEDED FOR ANXIETY 04/02/16  Yes Ann Held, DO  amLODipine (NORVASC) 5 MG tablet take '5mg'$  by mouth daily 03/06/16  Yes [provider]    aspirin 81 MG tablet Take 81 mg by mouth daily.   Yes [provider]  cetirizine (ZYRTEC) 10 MG tablet Take 10 mg by mouth at bedtime.   Yes [provider]  docusate sodium (COLACE) 100 MG capsule Take 100 mg by mouth 2 (two) times daily.    Yes [provider]  ferrous sulfate 325 (65 FE) MG EC tablet Take 325 mg by mouth 2 (two) times daily.   Yes [provider]  lidocaine-prilocaine (EMLA) cream Apply to affected area once 03/13/16  Yes Ennever, Rudell Cobb, MD  LORazepam (ATIVAN) 0.5 MG tablet Take 1 tablet (0.5 mg total) by mouth every 6 (six) hours as needed (Nausea or vomiting). 03/13/16  Yes Volanda Napoleon, MD  megestrol (MEGACE) 40 MG/ML suspension Take 10 mLs (400 mg total) by mouth daily. 03/07/16  Yes Volanda Napoleon, MD  metoprolol succinate (TOPROL-XL) 25 MG 24 hr tablet TAKE 1 TABLET(25 MG) BY MOUTH EVERY MORNING 05/29/16  Yes Roma Schanz R, DO  mirtazapine (REMERON SOL-TAB) 15 MG disintegrating tablet DISSOLVE 1 TABLET ON THE TONGUE AT BEDTIME 06/14/16  Yes Mosie Lukes, MD  Multiple Vitamins-Minerals (HM MULTIVITAMIN ADULT GUMMY) CHEW Chew 2 tablets by mouth every morning.   Yes [provider]  nicotine (NICODERM CQ - DOSED IN MG/24 HOURS) 14 mg/24hr patch Place 14 mg onto the skin daily.   Yes [provider]  omeprazole (PRILOSEC) 20 MG capsule TAKE ONE CAPSULE BY MOUTH TWICE DAILY BEFORE A MEAL 08/25/15  Yes Carollee Herter, Yvonne R, DO  ondansetron (ZOFRAN-ODT) 8 MG disintegrating tablet Take 1 tablet (8 mg total) by mouth every 8 (eight) hours as needed for nausea or vomiting. Reported on 05/17/2015 03/05/16  Yes Roma Schanz R, DO  PARoxetine (PAXIL) 10 MG tablet TAKE 1 TABLET BY MOUTH DAILY FOR DEPRESSION 05/29/16  Yes Lowne Chase, Yvonne R, DO  [name] 0.625 MG tablet TAKE 1 TABLET BY MOUTH EVERY DAY FOR 21 DAYS, THEN DO NOT TAKE FOR 7 DAYS Patient taking differently: take .'625mg'$  by mouth daily 06/04/16  Yes Roma Schanz R, DO  traZODone (DESYREL) 50 MG tablet TAKE 1 TABLET(50 MG) BY MOUTH AT BEDTIME 06/18/16  Yes Mosie Lukes, MD    Family History Family History  Problem Relation Age of Onset  . Hypertension Father   . Skin cancer Father   . Emphysema Paternal Grandfather   . Liver cancer Mother   . Stomach cancer Maternal Grandfather     Social History Social History  Substance Use Topics  . Smoking status: Former Smoker    Packs/day: 1.00    Years: 60.00    Types: Cigarettes  . Smokeless tobacco: Never Used     Comment: 8-9 cigarettes per day  . Alcohol use 8.4 oz/week    14 Shots of liquor  per week     Comment: 08/19/2014 "2 shots brandy q night"     Allergies   Morphine and related and Codeine   Review of Systems Review of Systems  Constitutional: Negative for fever.  HENT: Negative for sore throat.   Eyes: Negative for visual disturbance.  Respiratory: Negative for cough and shortness of breath.   Cardiovascular: Negative for chest pain.  Gastrointestinal: Positive for anal bleeding, blood in stool and diarrhea. Negative for abdominal pain, nausea and vomiting.  Genitourinary: Negative for difficulty urinating.  Musculoskeletal: Negative for back pain and neck pain.  Skin: Negative for rash.  Neurological: Positive for light-headedness. Negative for syncope and headaches.     Physical Exam Updated Vital Signs BP (!) 156/87 (BP Location: Left Arm)   Pulse (!) 109   Temp 99.1 F (37.3 C) (Oral)   Resp 20   Ht '5\' 7"'$  (1.702 m)   Wt 91 lb 4.3 oz (41.4 kg)   SpO2 99%   BMI 14.30 kg/m   Physical Exam  Constitutional: She is oriented to person, place, and time. She appears well-developed and well-nourished. No distress.  HENT:  Head: Normocephalic and atraumatic.  Eyes: Conjunctivae and EOM are normal.  Neck: Normal range of motion.  Cardiovascular: Regular rhythm, normal heart sounds and intact distal pulses.  Tachycardia present.  Exam reveals no  gallop and no friction rub.   No murmur heard. Pulmonary/Chest: Effort normal and breath sounds normal. No respiratory distress. She has no wheezes. She has no rales.  Abdominal: Soft. She exhibits no distension. There is no tenderness. There is no guarding.  Musculoskeletal: She exhibits no edema or tenderness.  Neurological: She is alert and oriented to person, place, and time.  Skin: Skin is warm and dry. No rash noted. She is not diaphoretic. No erythema.  Nursing note and vitals reviewed.    ED Treatments / Results  Labs (all labs ordered are listed, but only abnormal results are displayed) Labs Reviewed  CBC WITH DIFFERENTIAL/PLATELET - Abnormal; Notable for the following:       Result Value   WBC 15.5 (*)    RBC 3.02 (*)    Hemoglobin 9.7 (*)    HCT 29.7 (*)    Neutro Abs 11.9 (*)    Monocytes Absolute 1.3 (*)    All other components within normal limits  COMPREHENSIVE METABOLIC PANEL - Abnormal; Notable for the following:    CO2 21 (*)    Glucose, Bld 141 (*)    BUN 33 (*)    Creatinine, Ser 1.65 (*)    Calcium 8.7 (*)    Total Protein 6.2 (*)    Albumin 3.1 (*)    ALT 13 (*)    Total Bilirubin 0.2 (*)    GFR calc non Af Amer 28 (*)    GFR calc Af Amer 33 (*)    All other components within normal limits  HEMOGLOBIN AND HEMATOCRIT, BLOOD - Abnormal; Notable for the following:    Hemoglobin 8.3 (*)    HCT 25.5 (*)    All other components within normal limits  PROTIME-INR  HEMOGLOBIN AND HEMATOCRIT, BLOOD  HEMOGLOBIN AND HEMATOCRIT, BLOOD  BASIC METABOLIC PANEL  TYPE AND SCREEN  ABO/RH    EKG  EKG Interpretation None       Radiology No results found.  Procedures Procedures (including critical care time)  Medications Ordered in ED Medications  ferrous sulfate tablet 325 mg (not administered)  nicotine (NICODERM CQ - dosed in  mg/24 hours) patch 14 mg (not administered)  allopurinol (ZYLOPRIM) tablet 100 mg (100 mg Oral Given 07/13/16 2109)    traZODone (DESYREL) tablet 50 mg (50 mg Oral Given 07/13/16 2109)  mirtazapine (REMERON SOL-TAB) disintegrating tablet 15 mg (15 mg Oral Given 07/13/16 2109)  metoprolol succinate (TOPROL-XL) 24 hr tablet 25 mg (not administered)  PARoxetine (PAXIL) tablet 10 mg (not administered)  amLODipine (NORVASC) tablet 5 mg (not administered)  ALPRAZolam (XANAX) tablet 0.25 mg (not administered)  megestrol (MEGACE) 40 MG/ML suspension 400 mg (400 mg Oral Given 07/13/16 2109)  pantoprazole (PROTONIX) EC tablet 40 mg (not administered)  sodium chloride flush (NS) 0.9 % injection 3 mL (3 mLs Intravenous Given 07/13/16 2109)  0.9 %  sodium chloride infusion ( Intravenous New Bag/Given 07/13/16 2101)  acetaminophen (TYLENOL) tablet 650 mg (not administered)    Or  acetaminophen (TYLENOL) suppository 650 mg (not administered)  ondansetron (ZOFRAN) tablet 4 mg (not administered)    Or  ondansetron (ZOFRAN) injection 4 mg (not administered)  sodium chloride flush (NS) 0.9 % injection 10-40 mL (not administered)  sodium chloride 0.9 % bolus 1,000 mL (1,000 mLs Intravenous Transfusing/Transfer 07/13/16 1611)     Initial Impression / Assessment and Plan / ED Course  I have reviewed the triage vital signs and the nursing notes.  Pertinent labs & imaging results that were available during my care of the patient were reviewed by me and considered in my medical decision making (see chart for details).     82yo female with history of squamous cell carcinoma, prior diverticulitis per pt, concern for AVM on colonoscopy, no prior GI bleed, presents with concern for 3 large episodes of rectal bleeding. Initial blood pressure 93/60, HR 126. BP quickly improved, given IV saline and vital signs stable.  Hgb 9.7, prior 9-10.  No episodes of bleeding in ED. Notified [facility], Dr. Henrene Pastor, will admit to hospitalist for [facility] and GI may be contacted as needed if symptoms worsen.   Final  Clinical Impressions(s) / ED Diagnoses   Final diagnoses:  Rectal bleeding    New Prescriptions Current Discharge Medication List       Gareth Morgan, MD 07/13/16 2213

## 2016-07-13 NOTE — ED Notes (Signed)
Attempted to call report to floor.([phone]) Nurse unavailable at this time. Instructed to call back in 5 minutes.

## 2016-07-13 NOTE — ED Notes (Signed)
Pt on cardiac monitor.

## 2016-07-13 NOTE — ED Notes (Signed)
Patient denies pain and is resting comfortably.  

## 2016-07-13 NOTE — Progress Notes (Addendum)
Unable to perform orthostatic vital signs. Pt is wheelchair bound at baseline.

## 2016-07-14 DIAGNOSIS — K909 Intestinal malabsorption, unspecified: Secondary | ICD-10-CM | POA: Diagnosis present

## 2016-07-14 DIAGNOSIS — I129 Hypertensive chronic kidney disease with stage 1 through stage 4 chronic kidney disease, or unspecified chronic kidney disease: Secondary | ICD-10-CM | POA: Diagnosis present

## 2016-07-14 DIAGNOSIS — F329 Major depressive disorder, single episode, unspecified: Secondary | ICD-10-CM | POA: Diagnosis present

## 2016-07-14 DIAGNOSIS — D500 Iron deficiency anemia secondary to blood loss (chronic): Secondary | ICD-10-CM

## 2016-07-14 DIAGNOSIS — Z79899 Other long term (current) drug therapy: Secondary | ICD-10-CM | POA: Diagnosis not present

## 2016-07-14 DIAGNOSIS — D62 Acute posthemorrhagic anemia: Secondary | ICD-10-CM

## 2016-07-14 DIAGNOSIS — E440 Moderate protein-calorie malnutrition: Secondary | ICD-10-CM | POA: Diagnosis present

## 2016-07-14 DIAGNOSIS — N184 Chronic kidney disease, stage 4 (severe): Secondary | ICD-10-CM

## 2016-07-14 DIAGNOSIS — F419 Anxiety disorder, unspecified: Secondary | ICD-10-CM | POA: Diagnosis present

## 2016-07-14 DIAGNOSIS — K922 Gastrointestinal hemorrhage, unspecified: Secondary | ICD-10-CM | POA: Diagnosis not present

## 2016-07-14 DIAGNOSIS — I10 Essential (primary) hypertension: Secondary | ICD-10-CM | POA: Diagnosis not present

## 2016-07-14 DIAGNOSIS — Z993 Dependence on wheelchair: Secondary | ICD-10-CM | POA: Diagnosis not present

## 2016-07-14 DIAGNOSIS — Z8521 Personal history of malignant neoplasm of larynx: Secondary | ICD-10-CM | POA: Diagnosis not present

## 2016-07-14 DIAGNOSIS — M109 Gout, unspecified: Secondary | ICD-10-CM | POA: Diagnosis present

## 2016-07-14 DIAGNOSIS — Z66 Do not resuscitate: Secondary | ICD-10-CM | POA: Diagnosis present

## 2016-07-14 DIAGNOSIS — Z923 Personal history of irradiation: Secondary | ICD-10-CM | POA: Diagnosis not present

## 2016-07-14 DIAGNOSIS — K219 Gastro-esophageal reflux disease without esophagitis: Secondary | ICD-10-CM | POA: Diagnosis present

## 2016-07-14 DIAGNOSIS — J449 Chronic obstructive pulmonary disease, unspecified: Secondary | ICD-10-CM | POA: Diagnosis not present

## 2016-07-14 DIAGNOSIS — K5520 Angiodysplasia of colon without hemorrhage: Secondary | ICD-10-CM

## 2016-07-14 DIAGNOSIS — K625 Hemorrhage of anus and rectum: Secondary | ICD-10-CM | POA: Diagnosis not present

## 2016-07-14 DIAGNOSIS — K5521 Angiodysplasia of colon with hemorrhage: Secondary | ICD-10-CM | POA: Diagnosis present

## 2016-07-14 DIAGNOSIS — Z8673 Personal history of transient ischemic attack (TIA), and cerebral infarction without residual deficits: Secondary | ICD-10-CM | POA: Diagnosis not present

## 2016-07-14 DIAGNOSIS — Z7982 Long term (current) use of aspirin: Secondary | ICD-10-CM | POA: Diagnosis not present

## 2016-07-14 DIAGNOSIS — Z681 Body mass index (BMI) 19 or less, adult: Secondary | ICD-10-CM | POA: Diagnosis not present

## 2016-07-14 DIAGNOSIS — C3490 Malignant neoplasm of unspecified part of unspecified bronchus or lung: Secondary | ICD-10-CM | POA: Diagnosis present

## 2016-07-14 DIAGNOSIS — K921 Melena: Secondary | ICD-10-CM

## 2016-07-14 DIAGNOSIS — C3491 Malignant neoplasm of unspecified part of right bronchus or lung: Secondary | ICD-10-CM | POA: Diagnosis not present

## 2016-07-14 DIAGNOSIS — K5730 Diverticulosis of large intestine without perforation or abscess without bleeding: Secondary | ICD-10-CM | POA: Diagnosis not present

## 2016-07-14 LAB — HEMOGLOBIN AND HEMATOCRIT, BLOOD
HCT: 23.6 % — ABNORMAL LOW (ref 36.0–46.0)
HEMATOCRIT: 23.1 % — AB (ref 36.0–46.0)
Hemoglobin: 7.7 g/dL — ABNORMAL LOW (ref 12.0–15.0)
Hemoglobin: 7.8 g/dL — ABNORMAL LOW (ref 12.0–15.0)

## 2016-07-14 LAB — BASIC METABOLIC PANEL
Anion gap: 6 (ref 5–15)
BUN: 28 mg/dL — AB (ref 6–20)
CO2: 21 mmol/L — ABNORMAL LOW (ref 22–32)
CREATININE: 1.43 mg/dL — AB (ref 0.44–1.00)
Calcium: 8.1 mg/dL — ABNORMAL LOW (ref 8.9–10.3)
Chloride: 112 mmol/L — ABNORMAL HIGH (ref 101–111)
GFR calc non Af Amer: 34 mL/min — ABNORMAL LOW (ref >60)
GFR, EST AFRICAN AMERICAN: 39 mL/min — AB (ref >60)
Glucose, Bld: 89 mg/dL (ref 65–99)
Potassium: 4 mmol/L (ref 3.5–5.1)
SODIUM: 139 mmol/L (ref 135–145)

## 2016-07-14 LAB — TYPE AND SCREEN
ABO/RH(D): O POS
ANTIBODY SCREEN: NEGATIVE

## 2016-07-14 LAB — ABO/RH: ABO/RH(D): O POS

## 2016-07-14 MED ORDER — PEG-KCL-NACL-NASULF-NA ASC-C 100 G PO SOLR
0.5000 | Freq: Once | ORAL | Status: DC
  Filled 2016-07-14: qty 1

## 2016-07-14 MED ORDER — DIPHENHYDRAMINE-ZINC ACETATE 2-0.1 % EX CREA
TOPICAL_CREAM | Freq: Three times a day (TID) | CUTANEOUS | Status: DC | PRN
  Administered 2016-07-14 (×2): via TOPICAL
  Filled 2016-07-14: qty 28

## 2016-07-14 MED ORDER — PEG-KCL-NACL-NASULF-NA ASC-C 100 G PO SOLR: 1.0000 | Freq: Once | ORAL | Status: DC

## 2016-07-14 MED ORDER — HYDRALAZINE HCL 20 MG/ML IJ SOLN
5.0000 mg | INTRAMUSCULAR | Status: DC | PRN
  Administered 2016-07-14 – 2016-07-15 (×3): 5 mg via INTRAVENOUS
  Filled 2016-07-14 (×3): qty 1

## 2016-07-14 MED ORDER — PEG-KCL-NACL-NASULF-NA ASC-C 100 G PO SOLR
0.5000 | Freq: Once | ORAL | Status: AC
  Administered 2016-07-14: 100 g via ORAL
  Filled 2016-07-14: qty 1

## 2016-07-14 NOTE — Progress Notes (Signed)
PROGRESS NOTE    Marissa Cruz  ERX:[id number] DOB: 1934/09/16 DOA: 07/13/2016 PCP: Ann Held, DO    Brief Narrative:  82 y.o. woman with a history of NSCLC (undergoing active treatment with Dr. Marin Olp), TIA, iron deficiency anemia due to malabsorption, moderate protein calorie malnutrition, HTN, GERD, and anxiety/depression who presented to the ED in [facility] for evaluation of BRBPR, onset earlier today.  The patient developed diarrhea at home and discovered that she was also passing BRBPR.  She does not recall seeing blood clots.  She does not recall if the stools were maroon.  She describes persistent symptoms for at least one hour.  She had one episode of nausea and nonbloody emesis.  No significant abdominal pain.  She became light-headed and dizzy, but she did not have syncope.  No fever.  She called her daughter, who took her to the ED.  She has history of small hemorrhoids.  She has had a single episode of diverticulitis, 40 years ago.  Last colonoscopy with Dr. Fuller Plan in 2015.  Multiple AVMs in the ascending colon and at the cecum were found.  Polyps were also removed.  She denies any history of GI bleed prior to today.    She takes Aleve intermittently for pain.  Assessment & Plan:   Principal Problem:   Rectal bleeding Active Problems:   Anemia   CKD (chronic kidney disease) stage 4, GFR 15-29 ml/min (HCC)   Malignant neoplasm of lower lobe of right lung (HCC)   Iron deficiency anemia due to chronic blood loss   GI bleed   Diarrhea  Acute lower GI bleeding; history of colon AVMs. --Monitor for ongoing blood loss, hemodynamic instability, and transfusion requirement --HOLD aspirin for now, premarin for now --GI consulted, appreciate input. Plan for colonoscopy in AM -Repeat CBC in AM  Acute on chronic anemia due to blood loss --Monitor for transfusion requirement --Continue iron supplement as tolerate  HTN --Continue amlodipine and metoprolol for  now - BP stable at this time  CKD 4, creatinine slightly up from baseline --continuing on IVF -Cr stable -Repeat bmet in AM  Gout --Allopurinol continued  Anxiety/depression --PRN xanax --Remeron, paxil, trazodone -currently stable  GERD --On PPI at baseline -Presently stable  Recent tobacco cessation --Nicotine patch -Stable  Moderate protein calorie malnutrition --Megace supplement continued  DVT prophylaxis: SCD's Code Status: DNR Family Communication: Pt in room, family not at bedside Disposition Plan: Possible d/c home in 24-48hrs  Consultants:   GI  Procedures:     Antimicrobials: Anti-infectives    None       Subjective: No complaints  Objective: Vitals:   07/13/16 1600 07/13/16 1757 07/14/16 0429 07/14/16 1301  BP: 137/60 (!) 156/87 (!) 171/65 (!) 168/63  Pulse: 91 (!) 109 95 95  Resp: '18 20 19 19  '$ Temp:  99.1 F (37.3 C) 99.1 F (37.3 C) 98.9 F (37.2 C)  TempSrc:  Oral Oral Oral  SpO2: 96% 99% 97% 98%  Weight:  41.4 kg (91 lb 4.3 oz)    Height:  '5\' 7"'$  (1.702 m)      Intake/Output Summary (Last 24 hours) at 07/14/16 1439 Last data filed at 07/14/16 0600  Gross per 24 hour  Intake           901.33 ml  Output                0 ml  Net           901.33 ml  Filed Weights   07/13/16 1257 07/13/16 1757  Weight: 40.8 kg (90 lb) 41.4 kg (91 lb 4.3 oz)    Examination:  General exam: Appears calm and comfortable  Respiratory system: Clear to auscultation. Respiratory effort normal. Cardiovascular system: S1 & S2 heard, RRR. No JVD, murmurs, rubs, gallops or clicks. No pedal edema. Gastrointestinal system: Abdomen is nondistended, soft and nontender. No organomegaly or masses felt. Normal bowel sounds heard. Central nervous system: Alert and oriented. No focal neurological deficits. Extremities: Symmetric 5 x 5 power. Skin: No rashes, lesions Psychiatry: Judgement and insight appear normal. Mood & affect appropriate.   Data  Reviewed: I have personally reviewed following labs and imaging studies  CBC:  Recent Labs Lab 07/13/16 1329 07/13/16 2125 07/14/16 0321 07/14/16 0759  WBC 15.5*  --   --   --   NEUTROABS 11.9*  --   --   --   HGB 9.7* 8.3* 7.7* 7.8*  HCT 29.7* 25.5* 23.1* 23.6*  MCV 98.3  --   --   --   PLT 387  --   --   --    Basic Metabolic Panel:  Recent Labs Lab 07/13/16 1329 07/14/16 0321  NA 138 139  K 3.7 4.0  CL 108 112*  CO2 21* 21*  GLUCOSE 141* 89  BUN 33* 28*  CREATININE 1.65* 1.43*  CALCIUM 8.7* 8.1*   GFR: Estimated Creatinine Clearance: 20.8 mL/min (A) (by C-G formula based on SCr of 1.43 mg/dL (H)). Liver Function Tests:  Recent Labs Lab 07/13/16 1329  AST 23  ALT 13*  ALKPHOS 71  BILITOT 0.2*  PROT 6.2*  ALBUMIN 3.1*   No results for input(s): LIPASE, AMYLASE in the last 168 hours. No results for input(s): AMMONIA in the last 168 hours. Coagulation Profile:  Recent Labs Lab 07/13/16 1329  INR 1.00   Cardiac Enzymes: No results for input(s): CKTOTAL, CKMB, CKMBINDEX, TROPONINI in the last 168 hours. BNP (last 3 results) No results for input(s): PROBNP in the last 8760 hours. HbA1C: No results for input(s): HGBA1C in the last 72 hours. CBG: No results for input(s): GLUCAP in the last 168 hours. Lipid Profile: No results for input(s): CHOL, HDL, LDLCALC, TRIG, CHOLHDL, LDLDIRECT in the last 72 hours. Thyroid Function Tests: No results for input(s): TSH, T4TOTAL, FREET4, T3FREE, THYROIDAB in the last 72 hours. Anemia Panel: No results for input(s): VITAMINB12, FOLATE, FERRITIN, TIBC, IRON, RETICCTPCT in the last 72 hours. Sepsis Labs: No results for input(s): PROCALCITON, LATICACIDVEN in the last 168 hours.  No results found for this or any previous visit (from the past 240 hour(s)).   Radiology Studies: No results found.  Scheduled Meds: . allopurinol  100 mg Oral QHS  . amLODipine  5 mg Oral Daily  . ferrous sulfate  325 mg Oral BID PC    . megestrol  400 mg Oral Daily  . metoprolol succinate  25 mg Oral Daily  . mirtazapine  15 mg Oral QHS  . nicotine  14 mg Transdermal Daily  . pantoprazole  40 mg Oral Daily  . PARoxetine  10 mg Oral Daily  . peg 3350 powder  0.5 kit Oral Once   And  . [START ON 07/15/2016] peg 3350 powder  0.5 kit Oral Once  . sodium chloride flush  3 mL Intravenous Q12H  . traZODone  50 mg Oral QHS   Continuous Infusions: . sodium chloride 75 mL/hr at 07/14/16 1139     LOS: 0 days  Samera Macy, Orpah Melter, MD [facility] Pager [phone]  If 7PM-7AM, please contact night-coverage [email or web address] Password [facility] 07/14/2016, 2:39 PM

## 2016-07-14 NOTE — Care Management Obs Status (Signed)
[facility] NOTIFICATION   Patient Details  Name: Marissa Cruz MRN: [id number] Date of Birth: 1934-07-26   Medicare Observation Status Notification Given:  Yes    Erenest Rasher, RN 07/14/2016, 5:11 PM

## 2016-07-14 NOTE — Care Management Note (Signed)
Case Management Note  Patient Details  Name: Marissa Cruz MRN: [id number] Date of Birth: 21-May-1934  Subjective/Objective:     Acute GI bleed, anemia               Action/Plan: Discharge Planning:  NCM spoke to pt and states she lives at home alone, she has [employer] that comes 3x a week to assist with bathing. Has wheelchair and power wheelchair at home. Dtr assist pt in the home. Waiting final recommendations for home. Will continue to follow for dc needs.   PCP LOWNE Koren Shiver MD  Expected Discharge Date:                  Expected Discharge Plan:  Home/Self Care  In-House Referral:  NA  Discharge planning Services  CM Consult  Post Acute Care Choice:  NA Choice offered to:  NA  DME Arranged:  N/A DME Agency:  NA  HH Arranged:  NA HH Agency:     Status of Service:  In process, will continue to follow  If discussed at Long Length of Stay Meetings, dates discussed:    Additional Comments:  Erenest Rasher, RN 07/14/2016, 5:12 PM

## 2016-07-14 NOTE — Consult Note (Signed)
Referring Provider: [facility]   Primary Care Physician:  Carollee Herter, Alferd Apa, DO Primary Gastroenterologist: Lucio Edward,  MD  Reason for Consultation: GI bleeding  ASSESSMENT AND PLAN:   1. Pleasant 82 year old female with painless hematochezia and acute on chronic anemia. Rule out AVM hemorrhage as she has a history of large bowel AVMs in 2015. Could be diverticular though known history of diverticulosis.  -For further evaluation patient will be scheduled for am colonoscopy.  The risks and benefits of the procedure were discussed and the patient agrees to proceed. -monitor CBC closely, transfuse as needed.    2. Inoperable stage IIIB lung cancer, on Keytruda.   3. Hx of adenomatous colon polyps without high grade dysplasia Nov 2015.   4. HTN, SBP stable 150s- 170s   HPI: Marissa Cruz is a 82 y.o. 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 She is being treated with Keytruda every 3 weeks. She received IV iron early April. Patient presented to ED yesterday with painless lower GI bleeding. Hgb late Aprl was 10.7, down to 9.7 yesterday and 7.8 this am. Yesterday patient felt like she needed to have diarrhea. She was incontinent of bloody loose stools before getting to toilet. She had several more episodes before becoming woozy and vomiting (non-bloody) and coming to ED. No vomiting or bleeding since admission. She takes a daily ASA, a few months ago was taking Aleve. No other GI complaints. She has chronic constipation but manages well with colace and prunes  Colonoscopy in 2015 for anemia and heme positive stools. Multiple right sided AVMs found and two tubular adenomas with high grade dysplasia were removed.   Past Medical History:  Diagnosis Date  . Age-related macular degeneration, dry, right eye   . Age-related macular degeneration, wet, left eye ([place])   . Anemia   . Anxiety   . Arthritis    "thumbs, hands" (08/18/2014)  . CAP  (community acquired pneumonia) 08/19/2014  . Carotid artery disease ([place])   . Chicken pox   . Chronic lower back pain   . Colon polyp   . Depression   . Diverticulitis   . GERD (gastroesophageal reflux disease)   . Goals of care, counseling/discussion 03/07/2016  . History of blood transfusion 1976; 2013   "w/hysterectomy; hip OR"  . History of gout   . Hypertension   . Iron deficiency anemia due to chronic blood loss 06/06/2016  . 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 Kidney disease   . 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 TIA (transient ischemic attack) ~ 2009 X 2  . Vocal cord cancer ([place]) ~ 2009   S/P radiation    Past Surgical History:  Procedure Laterality Date  . ABDOMINAL HYSTERECTOMY  1976  . APPENDECTOMY  1954  . CAROTID ENDARTERECTOMY Left ~ 2014  . CATARACT EXTRACTION W/ INTRAOCULAR LENS  IMPLANT, BILATERAL Bilateral ~ 2009  . COLONOSCOPY N/A 01/31/2014   Procedure: COLONOSCOPY;  Surgeon: Ladene Artist, MD;  Location: [facility];  Service: Endoscopy;  Laterality: N/A;  . DILATION AND CURETTAGE OF UTERUS    . IR GENERIC HISTORICAL  03/12/2016   IR US GUIDE VASC ACCESS RIGHT 03/12/2016 Jacqulynn Cadet, MD [facility]  . IR GENERIC HISTORICAL  03/12/2016   IR FLUORO GUIDE PORT INSERTION RIGHT 03/12/2016 Jacqulynn Cadet, MD [facility]  . JOINT REPLACEMENT    . MOUTH FLOOR MASS EXCISION  03/2014   "removed cancer  underneath my tongue"  . TONSILLECTOMY  1941  . TOTAL HIP ARTHROPLASTY Left 2013    Prior to Admission medications   Medication Sig Start Date End Date Taking? Authorizing Provider  allopurinol (ZYLOPRIM) 100 MG tablet TAKE 1 TABLET BY MOUTH AT BEDTIME 07/01/16  Yes Lowne Chase, Yvonne R, DO  ALPRAZolam Duanne Moron) 0.25 MG tablet TAKE 1 TABLET BY MOUTH AS NEEDED FOR ANXIETY 04/02/16  Yes Roma Schanz R, DO  amLODipine (NORVASC) 5 MG tablet take '5mg'$  by mouth daily 03/06/16  Yes [provider]  aspirin 81 MG tablet Take 81 mg  by mouth daily.   Yes [provider]  cetirizine (ZYRTEC) 10 MG tablet Take 10 mg by mouth at bedtime.   Yes [provider]  docusate sodium (COLACE) 100 MG capsule Take 100 mg by mouth 2 (two) times daily.    Yes [provider]  ferrous sulfate 325 (65 FE) MG EC tablet Take 325 mg by mouth 2 (two) times daily.   Yes [provider]  lidocaine-prilocaine (EMLA) cream Apply to affected area once 03/13/16  Yes Ennever, Rudell Cobb, MD  LORazepam (ATIVAN) 0.5 MG tablet Take 1 tablet (0.5 mg total) by mouth every 6 (six) hours as needed (Nausea or vomiting). 03/13/16  Yes Volanda Napoleon, MD  megestrol (MEGACE) 40 MG/ML suspension Take 10 mLs (400 mg total) by mouth daily. 03/07/16  Yes Volanda Napoleon, MD  metoprolol succinate (TOPROL-XL) 25 MG 24 hr tablet TAKE 1 TABLET(25 MG) BY MOUTH EVERY MORNING 05/29/16  Yes Roma Schanz R, DO  mirtazapine (REMERON SOL-TAB) 15 MG disintegrating tablet DISSOLVE 1 TABLET ON THE TONGUE AT BEDTIME 06/14/16  Yes Mosie Lukes, MD  Multiple Vitamins-Minerals (HM MULTIVITAMIN ADULT GUMMY) CHEW Chew 2 tablets by mouth every morning.   Yes [provider]  nicotine (NICODERM CQ - DOSED IN MG/24 HOURS) 14 mg/24hr patch Place 14 mg onto the skin daily.   Yes [provider]  omeprazole (PRILOSEC) 20 MG capsule TAKE ONE CAPSULE BY MOUTH TWICE DAILY BEFORE A MEAL 08/25/15  Yes Carollee Herter, Yvonne R, DO  ondansetron (ZOFRAN-ODT) 8 MG disintegrating tablet Take 1 tablet (8 mg total) by mouth every 8 (eight) hours as needed for nausea or vomiting. Reported on 05/17/2015 03/05/16  Yes Roma Schanz R, DO  PARoxetine (PAXIL) 10 MG tablet TAKE 1 TABLET BY MOUTH DAILY FOR DEPRESSION 05/29/16  Yes Lowne Chase, Yvonne R, DO  [name] 0.625 MG tablet TAKE 1 TABLET BY MOUTH EVERY DAY FOR 21 DAYS, THEN DO NOT TAKE FOR 7 DAYS Patient taking differently: take .'625mg'$  by mouth daily 06/04/16  Yes Roma Schanz R, DO  traZODone  (DESYREL) 50 MG tablet TAKE 1 TABLET(50 MG) BY MOUTH AT BEDTIME 06/18/16  Yes Mosie Lukes, MD    Current Facility-Administered Medications  Medication Dose Route Frequency Provider Last Rate Last Dose  . 0.9 %  sodium chloride infusion   Intravenous Continuous Lily Kocher, MD 100 mL/hr at 07/14/16 0650    . acetaminophen (TYLENOL) tablet 650 mg  650 mg Oral Q6H PRN Lily Kocher, MD       Or  . acetaminophen (TYLENOL) suppository 650 mg  650 mg Rectal Q6H PRN Lily Kocher, MD      . allopurinol (ZYLOPRIM) tablet 100 mg  100 mg Oral QHS Lily Kocher, MD   100 mg at 07/13/16 2109  . ALPRAZolam Duanne Moron) tablet 0.25 mg  0.25 mg Oral BID PRN Lily Kocher,  MD      . amLODipine (NORVASC) tablet 5 mg  5 mg Oral Daily Lily Kocher, MD      . ferrous sulfate tablet 325 mg  325 mg Oral BID Maryclare Bean, MD      . megestrol (MEGACE) 40 MG/ML suspension 400 mg  400 mg Oral Daily Lily Kocher, MD   400 mg at 07/13/16 2109  . metoprolol succinate (TOPROL-XL) 24 hr tablet 25 mg  25 mg Oral Daily Lily Kocher, MD      . mirtazapine (REMERON SOL-TAB) disintegrating tablet 15 mg  15 mg Oral QHS Lily Kocher, MD   15 mg at 07/13/16 2109  . nicotine (NICODERM CQ - dosed in mg/24 hours) patch 14 mg  14 mg Transdermal Daily Lily Kocher, MD      . ondansetron [facility]) tablet 4 mg  4 mg Oral Q6H PRN Lily Kocher, MD       Or  . ondansetron [facility]) injection 4 mg  4 mg Intravenous Q6H PRN Lily Kocher, MD      . pantoprazole (PROTONIX) EC tablet 40 mg  40 mg Oral Daily Lily Kocher, MD      . PARoxetine (PAXIL) tablet 10 mg  10 mg Oral Daily Lily Kocher, MD      . sodium chloride flush (NS) 0.9 % injection 10-40 mL  10-40 mL Intracatheter PRN Theodis Blaze, MD   10 mL at 07/14/16 0931  . sodium chloride flush (NS) 0.9 % injection 3 mL  3 mL Intravenous Q12H Lily Kocher, MD   3 mL at 07/13/16 2109  . traZODone (DESYREL) tablet 50 mg  50 mg Oral QHS Lily Kocher, MD   50 mg at 07/13/16 2109    Facility-Administered Medications Ordered in Other Encounters  Medication Dose Route Frequency Provider Last Rate Last Dose  . sodium chloride flush (NS) 0.9 % injection 10 mL  10 mL Intracatheter PRN Volanda Napoleon, MD   10 mL at 04/25/16 1355    Allergies as of 07/13/2016 - Review Complete 07/13/2016  Allergen Reaction Noted  . Morphine and related Other (See Comments) 10/21/2013  . Codeine Nausea Only 01/24/2014    Family History  Problem Relation Age of Onset  . Hypertension Father   . Skin cancer Father   . Emphysema Paternal Grandfather   . Liver cancer Mother   . Stomach cancer Maternal Grandfather     Social History   Social History  . Marital status: Widowed    Spouse name: N/A  . Number of children: 1  . Years of education: N/A   Occupational History  . Retired    Social History Main Topics  . Smoking status: Former Smoker    Packs/day: 1.00    Years: 60.00    Types: Cigarettes  . Smokeless tobacco: Never Used     Comment: 8-9 cigarettes per day  . Alcohol use 8.4 oz/week    14 Shots of liquor per week     Comment: 08/19/2014 "2 shots brandy q night"  . Drug use: No  . Sexual activity: Not Currently   Other Topics Concern  . Not on file   Social History Narrative  . No narrative on file    Review of Systems: All systems reviewed and negative except where noted in HPI.  Physical Exam: Vital signs in last 24 hours: Temp:  [97.8 F (36.6 C)-99.1 F (37.3 C)] 99.1 F (37.3 C) (05/13 0429) Pulse Rate:  [86-126] 95 (05/13 0429)  Resp:  [14-23] 19 (05/13 0429) BP: (93-171)/(57-87) 171/65 (05/13 0429) SpO2:  [87 %-100 %] 97 % (05/13 0429) Weight:  [90 lb (40.8 kg)-91 lb 4.3 oz (41.4 kg)] 91 lb 4.3 oz (41.4 kg) (05/12 1757) Last BM Date: 07/13/16 General:   Alert, thin white female in NAD Psych:  Pleasant, cooperative. Normal mood and affect. Eyes:  Pupils equal, sclera clear, no icterus.   Conjunctiva pink. Ears:  Normal auditory  acuity. Nose:  No deformity, discharge,  or lesions. Neck:  Supple; no masses Lungs:  Clear throughout to auscultation.   No wheezes, crackles, or rhonchi.  Heart:  Regular rate and rhythm; no murmurs, no edema Abdomen:  Soft, non-distended, nontender, BS active, no palp mass    Rectal:  No blood on DRE, just flecks of light brown material.   Msk:  Symmetrical without gross deformities. . Pulses:  Normal pulses noted. Neurologic:  Alert and  oriented x4;  grossly normal neurologically. Skin:  Intact without significant lesions or rashes..   Intake/Output from previous day: 05/12 0701 - 05/13 0700 In: 901.3 [I.V.:901.3] Out: -  Intake/Output this shift: No intake/output data recorded.  Lab Results:  Recent Labs  07/13/16 1329 07/13/16 2125 07/14/16 0321 07/14/16 0759  WBC 15.5*  --   --   --   HGB 9.7* 8.3* 7.7* 7.8*  HCT 29.7* 25.5* 23.1* 23.6*  PLT 387  --   --   --    BMET  Recent Labs  07/13/16 1329 07/14/16 0321  NA 138 139  K 3.7 4.0  CL 108 112*  CO2 21* 21*  GLUCOSE 141* 89  BUN 33* 28*  CREATININE 1.65* 1.43*  CALCIUM 8.7* 8.1*   LFT  Recent Labs  07/13/16 1329  PROT 6.2*  ALBUMIN 3.1*  AST 23  ALT 13*  ALKPHOS 71  BILITOT 0.2*   PT/INR  Recent Labs  07/13/16 1329  LABPROT 13.2  INR 1.00   Hepatitis Panel No results for input(s): HEPBSAG, HCVAB, HEPAIGM, HEPBIGM in the last 72 hours.  Studies/Results: No results found.  Tye Savoy, NP-C @  07/14/2016, 10:01 AM Pager number [phone]  GI ATTENDING  History, laboratories, x-rays, prior colonoscopy report reviewed. Patient personally seen and examined. Agree with comprehensive consultation note as outlined above. Patient with advanced lung cancer. Overall has been stable. Now presents with painless hematochezia and moderate drop in hemoglobin from baseline. One small stool earlier today. Has been stable clinically since hospitalization. Previous colonoscopy does not demonstrate  diverticulosis. However right-sided ABMs. Suspect AVMs as the cause for bleeding. Plan colonoscopy in a.m. by Dr. Ardis Hughs with anticipated AVM ablation as the tendency for these lesions is to have recurrent bleeding in relatively short intervals. Discussed this with her. She is HIGHER than baseline RISK given her age and comorbidities.The nature of the procedure, as well as the risks, benefits, and alternatives were carefully and thoroughly reviewed with the patient. Ample time for discussion and questions allowed. The patient understood, was satisfied, and agreed to proceed.  Docia Chuck. Geri Seminole., M.D. [facility] Division of Gastroenterology

## 2016-07-15 ENCOUNTER — Inpatient Hospital Stay (HOSPITAL_COMMUNITY): Payer: Medicare Other | Admitting: Anesthesiology

## 2016-07-15 ENCOUNTER — Encounter (HOSPITAL_COMMUNITY)
Admission: EM | Disposition: A | Discharge status: Home / Self Care | Payer: Self-pay | Source: Home / Self Care | Attending: Internal Medicine

## 2016-07-15 ENCOUNTER — Encounter (HOSPITAL_COMMUNITY): Payer: Self-pay | Admitting: *Deleted

## 2016-07-15 DIAGNOSIS — K922 Gastrointestinal hemorrhage, unspecified: Secondary | ICD-10-CM

## 2016-07-15 DIAGNOSIS — J449 Chronic obstructive pulmonary disease, unspecified: Secondary | ICD-10-CM

## 2016-07-15 DIAGNOSIS — K5520 Angiodysplasia of colon without hemorrhage: Secondary | ICD-10-CM

## 2016-07-15 DIAGNOSIS — K921 Melena: Secondary | ICD-10-CM

## 2016-07-15 DIAGNOSIS — C3491 Malignant neoplasm of unspecified part of right bronchus or lung: Secondary | ICD-10-CM

## 2016-07-15 HISTORY — PX: COLONOSCOPY: SHX5424

## 2016-07-15 LAB — CBC
HCT: 26.2 % — ABNORMAL LOW (ref 36.0–46.0)
Hemoglobin: 8.3 g/dL — ABNORMAL LOW (ref 12.0–15.0)
MCH: 30.9 pg (ref 26.0–34.0)
MCHC: 31.7 g/dL (ref 30.0–36.0)
MCV: 97.4 fL (ref 78.0–100.0)
Platelets: 378 10*3/uL (ref 150–400)
RBC: 2.69 MIL/uL — ABNORMAL LOW (ref 3.87–5.11)
RDW: 14.3 % (ref 11.5–15.5)
WBC: 12.1 10*3/uL — ABNORMAL HIGH (ref 4.0–10.5)

## 2016-07-15 LAB — BASIC METABOLIC PANEL
Anion gap: 8 (ref 5–15)
BUN: 16 mg/dL (ref 6–20)
CALCIUM: 8.5 mg/dL — AB (ref 8.9–10.3)
CO2: 20 mmol/L — AB (ref 22–32)
Chloride: 114 mmol/L — ABNORMAL HIGH (ref 101–111)
Creatinine, Ser: 1.19 mg/dL — ABNORMAL HIGH (ref 0.44–1.00)
GFR calc Af Amer: 49 mL/min — ABNORMAL LOW (ref >60)
GFR, EST NON AFRICAN AMERICAN: 42 mL/min — AB (ref >60)
Glucose, Bld: 76 mg/dL (ref 65–99)
Potassium: 3.9 mmol/L (ref 3.5–5.1)
Sodium: 142 mmol/L (ref 135–145)

## 2016-07-15 SURGERY — COLONOSCOPY
Anesthesia: Monitor Anesthesia Care

## 2016-07-15 MED ORDER — PROPOFOL 10 MG/ML IV BOLUS
INTRAVENOUS | Status: AC
  Filled 2016-07-15: qty 40

## 2016-07-15 MED ORDER — METOPROLOL TARTRATE 5 MG/5ML IV SOLN
1.0000 mg | INTRAVENOUS | Status: DC | PRN
  Filled 2016-07-15: qty 5

## 2016-07-15 MED ORDER — LIDOCAINE 2% (20 MG/ML) 5 ML SYRINGE
INTRAMUSCULAR | Status: DC | PRN
  Administered 2016-07-15: 60 mg via INTRAVENOUS

## 2016-07-15 MED ORDER — PROPOFOL 10 MG/ML IV BOLUS
INTRAVENOUS | Status: AC
  Filled 2016-07-15: qty 20

## 2016-07-15 MED ORDER — PROPOFOL 500 MG/50ML IV EMUL
INTRAVENOUS | Status: DC | PRN
  Administered 2016-07-15: 120 ug/kg/min via INTRAVENOUS

## 2016-07-15 MED ORDER — PHENYLEPHRINE 40 MCG/ML (10ML) SYRINGE FOR IV PUSH (FOR BLOOD PRESSURE SUPPORT)
PREFILLED_SYRINGE | INTRAVENOUS | Status: AC
  Filled 2016-07-15: qty 10

## 2016-07-15 MED ORDER — PROPOFOL 10 MG/ML IV BOLUS
INTRAVENOUS | Status: DC | PRN
  Administered 2016-07-15 (×4): 20 mg via INTRAVENOUS

## 2016-07-15 MED ORDER — PHENYLEPHRINE 40 MCG/ML (10ML) SYRINGE FOR IV PUSH (FOR BLOOD PRESSURE SUPPORT)
PREFILLED_SYRINGE | INTRAVENOUS | Status: DC | PRN
  Administered 2016-07-15 (×2): 40 ug via INTRAVENOUS

## 2016-07-15 MED ORDER — LIDOCAINE 2% (20 MG/ML) 5 ML SYRINGE
INTRAMUSCULAR | Status: AC
  Filled 2016-07-15: qty 5

## 2016-07-15 NOTE — Anesthesia Preprocedure Evaluation (Addendum)
Anesthesia Evaluation  Patient identified by MRN, date of birth, ID band Patient awake    Reviewed: Allergy & Precautions, NPO status , Patient's Chart, lab work & pertinent test results  Airway Mallampati: III  TM Distance: <3 FB Neck ROM: Full    Dental no notable dental hx.    Pulmonary neg pulmonary ROS, former smoker,    Pulmonary exam normal breath sounds clear to auscultation       Cardiovascular hypertension, Pt. on medications and Pt. on home beta blockers Normal cardiovascular exam Rhythm:Regular Rate:Normal     Neuro/Psych Anxiety TIAnegative psych ROS   GI/Hepatic negative GI ROS, Neg liver ROS, GERD  Medicated and Controlled,  Endo/Other  negative endocrine ROS  Renal/GU negative Renal ROS  negative genitourinary   Musculoskeletal negative musculoskeletal ROS (+)   Abdominal   Peds negative pediatric ROS (+)  Hematology negative hematology ROS (+) anemia ,   Anesthesia Other Findings   Reproductive/Obstetrics negative OB ROS                           Anesthesia Physical Anesthesia Plan  ASA: III  Anesthesia Plan: MAC   Post-op Pain Management:    Induction: Intravenous  Airway Management Planned:   Additional Equipment:   Intra-op Plan:   Post-operative Plan:   Informed Consent: I have reviewed the patients History and Physical, chart, labs and discussed the procedure including the risks, benefits and alternatives for the proposed anesthesia with the patient or authorized representative who has indicated his/her understanding and acceptance.   Dental advisory given  Plan Discussed with: CRNA and Surgeon  Anesthesia Plan Comments:        Anesthesia Quick Evaluation

## 2016-07-15 NOTE — Anesthesia Postprocedure Evaluation (Signed)
Anesthesia Post Note  Patient: Marissa Cruz  Procedure(s) Performed: Procedure(s) (LRB): COLONOSCOPY (N/A)  Patient location during evaluation: PACU Anesthesia Type: MAC Level of consciousness: awake and alert Pain management: pain level controlled Vital Signs Assessment: post-procedure vital signs reviewed and stable Respiratory status: spontaneous breathing, nonlabored ventilation, respiratory function stable and patient connected to nasal cannula oxygen Cardiovascular status: stable and blood pressure returned to baseline Anesthetic complications: no       Last Vitals:  Vitals:   07/15/16 1054 07/15/16 1100  BP: (!) 142/52 (!) 143/54  Pulse: (!) 102 95  Resp: 18 (!) 22  Temp:  36.4 C    Last Pain:  Vitals:   07/15/16 1100  TempSrc: Oral  PainSc:                  [name]

## 2016-07-15 NOTE — H&P (View-Only) (Signed)
Referring Provider: [facility]   Primary Care Physician:  Carollee Herter, Alferd Apa, DO Primary Gastroenterologist: Lucio Edward,  MD  Reason for Consultation: GI bleeding  ASSESSMENT AND PLAN:   1. Pleasant 82 year old female with painless hematochezia and acute on chronic anemia. Rule out AVM hemorrhage as she has a history of large bowel AVMs in 2015. Could be diverticular though known history of diverticulosis.  -For further evaluation patient will be scheduled for am colonoscopy.  The risks and benefits of the procedure were discussed and the patient agrees to proceed. -monitor CBC closely, transfuse as needed.    2. Inoperable stage IIIB lung cancer, on Keytruda.   3. Hx of adenomatous colon polyps without high grade dysplasia Nov 2015.   4. HTN, SBP stable 150s- 170s   HPI: Marissa Cruz is a 82 y.o. 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 She is being treated with Keytruda every 3 weeks. She received IV iron early April. Patient presented to ED yesterday with painless lower GI bleeding. Hgb late Aprl was 10.7, down to 9.7 yesterday and 7.8 this am. Yesterday patient felt like she needed to have diarrhea. She was incontinent of bloody loose stools before getting to toilet. She had several more episodes before becoming woozy and vomiting (non-bloody) and coming to ED. No vomiting or bleeding since admission. She takes a daily ASA, a few months ago was taking Aleve. No other GI complaints. She has chronic constipation but manages well with colace and prunes  Colonoscopy in 2015 for anemia and heme positive stools. Multiple right sided AVMs found and two tubular adenomas with high grade dysplasia were removed.   Past Medical History:  Diagnosis Date  . Age-related macular degeneration, dry, right eye   . Age-related macular degeneration, wet, left eye ([place])   . Anemia   . Anxiety   . Arthritis    "thumbs, hands" (08/18/2014)  . CAP  (community acquired pneumonia) 08/19/2014  . Carotid artery disease ([place])   . Chicken pox   . Chronic lower back pain   . Colon polyp   . Depression   . Diverticulitis   . GERD (gastroesophageal reflux disease)   . Goals of care, counseling/discussion 03/07/2016  . History of blood transfusion 1976; 2013   "w/hysterectomy; hip OR"  . History of gout   . Hypertension   . Iron deficiency anemia due to chronic blood loss 06/06/2016  . 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 Kidney disease   . 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 TIA (transient ischemic attack) ~ 2009 X 2  . Vocal cord cancer ([place]) ~ 2009   S/P radiation    Past Surgical History:  Procedure Laterality Date  . ABDOMINAL HYSTERECTOMY  1976  . APPENDECTOMY  1954  . CAROTID ENDARTERECTOMY Left ~ 2014  . CATARACT EXTRACTION W/ INTRAOCULAR LENS  IMPLANT, BILATERAL Bilateral ~ 2009  . COLONOSCOPY N/A 01/31/2014   Procedure: COLONOSCOPY;  Surgeon: Ladene Artist, MD;  Location: [facility];  Service: Endoscopy;  Laterality: N/A;  . DILATION AND CURETTAGE OF UTERUS    . IR GENERIC HISTORICAL  03/12/2016   IR US GUIDE VASC ACCESS RIGHT 03/12/2016 Jacqulynn Cadet, MD [facility]  . IR GENERIC HISTORICAL  03/12/2016   IR FLUORO GUIDE PORT INSERTION RIGHT 03/12/2016 Jacqulynn Cadet, MD [facility]  . JOINT REPLACEMENT    . MOUTH FLOOR MASS EXCISION  03/2014   "removed cancer  underneath my tongue"  . TONSILLECTOMY  1941  . TOTAL HIP ARTHROPLASTY Left 2013    Prior to Admission medications   Medication Sig Start Date End Date Taking? Authorizing Provider  allopurinol (ZYLOPRIM) 100 MG tablet TAKE 1 TABLET BY MOUTH AT BEDTIME 07/01/16  Yes Lowne Chase, Yvonne R, DO  ALPRAZolam Duanne Moron) 0.25 MG tablet TAKE 1 TABLET BY MOUTH AS NEEDED FOR ANXIETY 04/02/16  Yes Ann Held, DO  amLODipine (NORVASC) 5 MG tablet take '5mg'$  by mouth daily 03/06/16  Yes [provider]  aspirin 81 MG tablet Take 81 mg  by mouth daily.   Yes [provider]  cetirizine (ZYRTEC) 10 MG tablet Take 10 mg by mouth at bedtime.   Yes [provider]  docusate sodium (COLACE) 100 MG capsule Take 100 mg by mouth 2 (two) times daily.    Yes [provider]  ferrous sulfate 325 (65 FE) MG EC tablet Take 325 mg by mouth 2 (two) times daily.   Yes [provider]  lidocaine-prilocaine (EMLA) cream Apply to affected area once 03/13/16  Yes Ennever, Rudell Cobb, MD  LORazepam (ATIVAN) 0.5 MG tablet Take 1 tablet (0.5 mg total) by mouth every 6 (six) hours as needed (Nausea or vomiting). 03/13/16  Yes Volanda Napoleon, MD  megestrol (MEGACE) 40 MG/ML suspension Take 10 mLs (400 mg total) by mouth daily. 03/07/16  Yes Volanda Napoleon, MD  metoprolol succinate (TOPROL-XL) 25 MG 24 hr tablet TAKE 1 TABLET(25 MG) BY MOUTH EVERY MORNING 05/29/16  Yes Roma Schanz R, DO  mirtazapine (REMERON SOL-TAB) 15 MG disintegrating tablet DISSOLVE 1 TABLET ON THE TONGUE AT BEDTIME 06/14/16  Yes Mosie Lukes, MD  Multiple Vitamins-Minerals (HM MULTIVITAMIN ADULT GUMMY) CHEW Chew 2 tablets by mouth every morning.   Yes [provider]  nicotine (NICODERM CQ - DOSED IN MG/24 HOURS) 14 mg/24hr patch Place 14 mg onto the skin daily.   Yes [provider]  omeprazole (PRILOSEC) 20 MG capsule TAKE ONE CAPSULE BY MOUTH TWICE DAILY BEFORE A MEAL 08/25/15  Yes Carollee Herter, Yvonne R, DO  ondansetron (ZOFRAN-ODT) 8 MG disintegrating tablet Take 1 tablet (8 mg total) by mouth every 8 (eight) hours as needed for nausea or vomiting. Reported on 05/17/2015 03/05/16  Yes Roma Schanz R, DO  PARoxetine (PAXIL) 10 MG tablet TAKE 1 TABLET BY MOUTH DAILY FOR DEPRESSION 05/29/16  Yes Lowne Chase, Yvonne R, DO  [name] 0.625 MG tablet TAKE 1 TABLET BY MOUTH EVERY DAY FOR 21 DAYS, THEN DO NOT TAKE FOR 7 DAYS Patient taking differently: take .'625mg'$  by mouth daily 06/04/16  Yes Roma Schanz R, DO  traZODone  (DESYREL) 50 MG tablet TAKE 1 TABLET(50 MG) BY MOUTH AT BEDTIME 06/18/16  Yes Mosie Lukes, MD    Current Facility-Administered Medications  Medication Dose Route Frequency Provider Last Rate Last Dose  . 0.9 %  sodium chloride infusion   Intravenous Continuous Lily Kocher, MD 100 mL/hr at 07/14/16 0650    . acetaminophen (TYLENOL) tablet 650 mg  650 mg Oral Q6H PRN Lily Kocher, MD       Or  . acetaminophen (TYLENOL) suppository 650 mg  650 mg Rectal Q6H PRN Lily Kocher, MD      . allopurinol (ZYLOPRIM) tablet 100 mg  100 mg Oral QHS Lily Kocher, MD   100 mg at 07/13/16 2109  . ALPRAZolam Duanne Moron) tablet 0.25 mg  0.25 mg Oral BID PRN Lily Kocher,  MD      . amLODipine (NORVASC) tablet 5 mg  5 mg Oral Daily Lily Kocher, MD      . ferrous sulfate tablet 325 mg  325 mg Oral BID Maryclare Bean, MD      . megestrol (MEGACE) 40 MG/ML suspension 400 mg  400 mg Oral Daily Lily Kocher, MD   400 mg at 07/13/16 2109  . metoprolol succinate (TOPROL-XL) 24 hr tablet 25 mg  25 mg Oral Daily Lily Kocher, MD      . mirtazapine (REMERON SOL-TAB) disintegrating tablet 15 mg  15 mg Oral QHS Lily Kocher, MD   15 mg at 07/13/16 2109  . nicotine (NICODERM CQ - dosed in mg/24 hours) patch 14 mg  14 mg Transdermal Daily Lily Kocher, MD      . ondansetron [facility]) tablet 4 mg  4 mg Oral Q6H PRN Lily Kocher, MD       Or  . ondansetron [facility]) injection 4 mg  4 mg Intravenous Q6H PRN Lily Kocher, MD      . pantoprazole (PROTONIX) EC tablet 40 mg  40 mg Oral Daily Lily Kocher, MD      . PARoxetine (PAXIL) tablet 10 mg  10 mg Oral Daily Lily Kocher, MD      . sodium chloride flush (NS) 0.9 % injection 10-40 mL  10-40 mL Intracatheter PRN Theodis Blaze, MD   10 mL at 07/14/16 0931  . sodium chloride flush (NS) 0.9 % injection 3 mL  3 mL Intravenous Q12H Lily Kocher, MD   3 mL at 07/13/16 2109  . traZODone (DESYREL) tablet 50 mg  50 mg Oral QHS Lily Kocher, MD   50 mg at 07/13/16 2109    Facility-Administered Medications Ordered in Other Encounters  Medication Dose Route Frequency Provider Last Rate Last Dose  . sodium chloride flush (NS) 0.9 % injection 10 mL  10 mL Intracatheter PRN Volanda Napoleon, MD   10 mL at 04/25/16 1355    Allergies as of 07/13/2016 - Review Complete 07/13/2016  Allergen Reaction Noted  . Morphine and related Other (See Comments) 10/21/2013  . Codeine Nausea Only 01/24/2014    Family History  Problem Relation Age of Onset  . Hypertension Father   . Skin cancer Father   . Emphysema Paternal Grandfather   . Liver cancer Mother   . Stomach cancer Maternal Grandfather     Social History   Social History  . Marital status: Widowed    Spouse name: N/A  . Number of children: 1  . Years of education: N/A   Occupational History  . Retired    Social History Main Topics  . Smoking status: Former Smoker    Packs/day: 1.00    Years: 60.00    Types: Cigarettes  . Smokeless tobacco: Never Used     Comment: 8-9 cigarettes per day  . Alcohol use 8.4 oz/week    14 Shots of liquor per week     Comment: 08/19/2014 "2 shots brandy q night"  . Drug use: No  . Sexual activity: Not Currently   Other Topics Concern  . Not on file   Social History Narrative  . No narrative on file    Review of Systems: All systems reviewed and negative except where noted in HPI.  Physical Exam: Vital signs in last 24 hours: Temp:  [97.8 F (36.6 C)-99.1 F (37.3 C)] 99.1 F (37.3 C) (05/13 0429) Pulse Rate:  [86-126] 95 (05/13 0429)  Resp:  [14-23] 19 (05/13 0429) BP: (93-171)/(57-87) 171/65 (05/13 0429) SpO2:  [87 %-100 %] 97 % (05/13 0429) Weight:  [90 lb (40.8 kg)-91 lb 4.3 oz (41.4 kg)] 91 lb 4.3 oz (41.4 kg) (05/12 1757) Last BM Date: 07/13/16 General:   Alert, thin white female in NAD Psych:  Pleasant, cooperative. Normal mood and affect. Eyes:  Pupils equal, sclera clear, no icterus.   Conjunctiva pink. Ears:  Normal auditory  acuity. Nose:  No deformity, discharge,  or lesions. Neck:  Supple; no masses Lungs:  Clear throughout to auscultation.   No wheezes, crackles, or rhonchi.  Heart:  Regular rate and rhythm; no murmurs, no edema Abdomen:  Soft, non-distended, nontender, BS active, no palp mass    Rectal:  No blood on DRE, just flecks of light brown material.   Msk:  Symmetrical without gross deformities. . Pulses:  Normal pulses noted. Neurologic:  Alert and  oriented x4;  grossly normal neurologically. Skin:  Intact without significant lesions or rashes..   Intake/Output from previous day: 05/12 0701 - 05/13 0700 In: 901.3 [I.V.:901.3] Out: -  Intake/Output this shift: No intake/output data recorded.  Lab Results:  Recent Labs  07/13/16 1329 07/13/16 2125 07/14/16 0321 07/14/16 0759  WBC 15.5*  --   --   --   HGB 9.7* 8.3* 7.7* 7.8*  HCT 29.7* 25.5* 23.1* 23.6*  PLT 387  --   --   --    BMET  Recent Labs  07/13/16 1329 07/14/16 0321  NA 138 139  K 3.7 4.0  CL 108 112*  CO2 21* 21*  GLUCOSE 141* 89  BUN 33* 28*  CREATININE 1.65* 1.43*  CALCIUM 8.7* 8.1*   LFT  Recent Labs  07/13/16 1329  PROT 6.2*  ALBUMIN 3.1*  AST 23  ALT 13*  ALKPHOS 71  BILITOT 0.2*   PT/INR  Recent Labs  07/13/16 1329  LABPROT 13.2  INR 1.00   Hepatitis Panel No results for input(s): HEPBSAG, HCVAB, HEPAIGM, HEPBIGM in the last 72 hours.  Studies/Results: No results found.  Tye Savoy, NP-C @  07/14/2016, 10:01 AM Pager number [phone]  GI ATTENDING  History, laboratories, x-rays, prior colonoscopy report reviewed. Patient personally seen and examined. Agree with comprehensive consultation note as outlined above. Patient with advanced lung cancer. Overall has been stable. Now presents with painless hematochezia and moderate drop in hemoglobin from baseline. One small stool earlier today. Has been stable clinically since hospitalization. Previous colonoscopy does not demonstrate  diverticulosis. However right-sided ABMs. Suspect AVMs as the cause for bleeding. Plan colonoscopy in a.m. by Dr. Ardis Hughs with anticipated AVM ablation as the tendency for these lesions is to have recurrent bleeding in relatively short intervals. Discussed this with her. She is HIGHER than baseline RISK given her age and comorbidities.The nature of the procedure, as well as the risks, benefits, and alternatives were carefully and thoroughly reviewed with the patient. Ample time for discussion and questions allowed. The patient understood, was satisfied, and agreed to proceed.  Docia Chuck. Geri Seminole., M.D. [facility] Division of Gastroenterology

## 2016-07-15 NOTE — Transfer of Care (Signed)
Immediate Anesthesia Transfer of Care Note  Patient: Marissa Cruz  Procedure(s) Performed: Procedure(s): COLONOSCOPY (N/A)  Patient Location: PACU and Endoscopy Unit  Anesthesia Type:MAC  Level of Consciousness: awake, alert , oriented and patient cooperative  Airway & Oxygen Therapy: Patient Spontanous Breathing and Patient connected to face mask oxygen  Post-op Assessment: Report given to RN, Post -op Vital signs reviewed and stable and Patient moving all extremities  Post vital signs: Reviewed and stable  Last Vitals:  Vitals:   07/15/16 0824 07/15/16 0938  BP: (!) 160/64 (!) 166/60  Pulse: (!) 116 (!) 105  Resp:  16  Temp:  37.1 C    Last Pain:  Vitals:   07/15/16 0938  TempSrc: Oral  PainSc:          Complications: No apparent anesthesia complications

## 2016-07-15 NOTE — Consult Note (Signed)
Referral MD  Reason for Referral: Lower GI bleeding; locally advanced-inoperable squamous cell carcinoma   Chief Complaint  Patient presents with  . Rectal Bleeding  : I had diarrhea and bleeding on Saturday.  HPI: [name] is a very nice 82 year old white female. 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 She has terrible underlying COPD. She is not felt to be an operative candidate. She has responded very nicely to immunotherapy. We have her on Keytruda. She has had 6 cycles. Her last cycle was given on April 26.  We have noted that she has had some iron deficiency. She has had IV iron.  Over the weekend, she developed acute onset of diarrhea. She had blood in the diarrhea. She then had an episode of emesis.  Her daughter took her to the emergency room. She was admitted. She was found to be anemic. Her white cell count 15.5. Hemoglobin 9.7 platelet count 387. With IV fluids, her hemoglobin dropped to 8.3. Today, her white count was 12.1. Hemoglobin 8.3. Platelet count 378,000.  Her electrolytes all looked okay. Her BUN was 33 and creatinine was 1.65.  She has been seen by gastroenterology. She'll have a colonoscopy today. Patient had one episode of diverticulosis which was 45 years ago.  She has tolerated her Keytruda very nicely. She has not had issues with the [facility].  She's had no fever. She's had no rashes.  Her main issue with [place] as just been appetite. We have her on some Megace to try to get her to eat better.  Overall, her performance status is ECOG 2-3.    Past Medical History:  Diagnosis Date  . Age-related macular degeneration, dry, right eye   . Age-related macular degeneration, wet, left eye ([place])   . Anemia   . Anxiety   . Arthritis    "thumbs, hands" (08/18/2014)  . CAP (community acquired pneumonia) 08/19/2014  . Carotid artery disease ([place])   . Chicken pox   . Chronic lower back pain   . Colon polyp   . Depression    . Diverticulitis   . GERD (gastroesophageal reflux disease)   . Goals of care, counseling/discussion 03/07/2016  . History of blood transfusion 1976; 2013   "w/hysterectomy; hip OR"  . History of gout   . Hypertension   . Iron deficiency anemia due to chronic blood loss 06/06/2016  . 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 Kidney disease   . 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 TIA (transient ischemic attack) ~ 2009 X 2  . Vocal cord cancer ([place]) ~ 2009   S/P radiation  :  Past Surgical History:  Procedure Laterality Date  . ABDOMINAL HYSTERECTOMY  1976  . APPENDECTOMY  1954  . CAROTID ENDARTERECTOMY Left ~ 2014  . CATARACT EXTRACTION W/ INTRAOCULAR LENS  IMPLANT, BILATERAL Bilateral ~ 2009  . COLONOSCOPY N/A 01/31/2014   Procedure: COLONOSCOPY;  Surgeon: Ladene Artist, MD;  Location: [facility];  Service: Endoscopy;  Laterality: N/A;  . DILATION AND CURETTAGE OF UTERUS    . IR GENERIC HISTORICAL  03/12/2016   IR US GUIDE VASC ACCESS RIGHT 03/12/2016 Jacqulynn Cadet, MD [facility]  . IR GENERIC HISTORICAL  03/12/2016   IR FLUORO GUIDE PORT INSERTION RIGHT 03/12/2016 Jacqulynn Cadet, MD [facility]  . JOINT REPLACEMENT    . MOUTH FLOOR MASS EXCISION  03/2014   "removed cancer underneath my tongue"  . TONSILLECTOMY  1941  .  TOTAL HIP ARTHROPLASTY Left 2013  :   Current Facility-Administered Medications:  .  0.9 %  sodium chloride infusion, , Intravenous, Continuous, Donne Hazel, MD, Last Rate: 75 mL/hr at 07/15/16 0600 .  acetaminophen (TYLENOL) tablet 650 mg, 650 mg, Oral, Q6H PRN **OR** acetaminophen (TYLENOL) suppository 650 mg, 650 mg, Rectal, Q6H PRN, Lily Kocher, MD .  allopurinol (ZYLOPRIM) tablet 100 mg, 100 mg, Oral, QHS, Lily Kocher, MD, 100 mg at 07/14/16 2134 .  ALPRAZolam Duanne Moron) tablet 0.25 mg, 0.25 mg, Oral, BID PRN, Lily Kocher, MD .  amLODipine (NORVASC) tablet 5 mg, 5 mg, Oral, Daily, Lily Kocher, MD, 5 mg at 07/14/16  1057 .  diphenhydrAMINE-zinc acetate (BENADRYL) 2-0.1 % cream, , Topical, TID PRN, Donne Hazel, MD .  ferrous sulfate tablet 325 mg, 325 mg, Oral, BID PC, Lily Kocher, MD, 325 mg at 07/14/16 1755 .  hydrALAZINE (APRESOLINE) injection 5 mg, 5 mg, Intravenous, Q4H PRN, Rise Patience, MD, 5 mg at 07/15/16 0606 .  megestrol (MEGACE) 40 MG/ML suspension 400 mg, 400 mg, Oral, Daily, Lily Kocher, MD, 400 mg at 07/14/16 1100 .  metoprolol succinate (TOPROL-XL) 24 hr tablet 25 mg, 25 mg, Oral, Daily, Lily Kocher, MD, 25 mg at 07/14/16 1056 .  mirtazapine (REMERON SOL-TAB) disintegrating tablet 15 mg, 15 mg, Oral, QHS, Lily Kocher, MD, 15 mg at 07/14/16 2134 .  nicotine (NICODERM CQ - dosed in mg/24 hours) patch 14 mg, 14 mg, Transdermal, Daily, Lily Kocher, MD, 14 mg at 07/14/16 1057 .  ondansetron (ZOFRAN) tablet 4 mg, 4 mg, Oral, Q6H PRN **OR** ondansetron (ZOFRAN) injection 4 mg, 4 mg, Intravenous, Q6H PRN, Lily Kocher, MD, 4 mg at 07/14/16 1758 .  pantoprazole (PROTONIX) EC tablet 40 mg, 40 mg, Oral, Daily, Lily Kocher, MD, 40 mg at 07/14/16 1057 .  PARoxetine (PAXIL) tablet 10 mg, 10 mg, Oral, Daily, Lily Kocher, MD, 10 mg at 07/14/16 1056 .  [COMPLETED] peg 3350 powder (MOVIPREP) kit 100 g, 0.5 kit, Oral, Once, 100 g at 07/14/16 1754 **AND** peg 3350 powder (MOVIPREP) kit 100 g, 0.5 kit, Oral, Once, Donne Hazel, MD .  sodium chloride flush (NS) 0.9 % injection 10-40 mL, 10-40 mL, Intracatheter, PRN, Theodis Blaze, MD, 10 mL at 07/14/16 0931 .  sodium chloride flush (NS) 0.9 % injection 3 mL, 3 mL, Intravenous, Q12H, Lily Kocher, MD, 3 mL at 07/14/16 2200 .  traZODone (DESYREL) tablet 50 mg, 50 mg, Oral, QHS, Lily Kocher, MD, 50 mg at 07/14/16 2134  Facility-Administered Medications Ordered in Other Encounters:  .  sodium chloride flush (NS) 0.9 % injection 10 mL, 10 mL, Intracatheter, PRN, Volanda Napoleon, MD, 10 mL at 04/25/16 1355:  . allopurinol  100 mg Oral  QHS  . amLODipine  5 mg Oral Daily  . ferrous sulfate  325 mg Oral BID PC  . megestrol  400 mg Oral Daily  . metoprolol succinate  25 mg Oral Daily  . mirtazapine  15 mg Oral QHS  . nicotine  14 mg Transdermal Daily  . pantoprazole  40 mg Oral Daily  . PARoxetine  10 mg Oral Daily  . peg 3350 powder  0.5 kit Oral Once  . sodium chloride flush  3 mL Intravenous Q12H  . traZODone  50 mg Oral QHS  :  Allergies  Allergen Reactions  . Morphine And Related Other (See Comments)    Hallucinations and combative  . Codeine Nausea Only  :  Family History  Problem Relation Age of Onset  . Hypertension Father   . Skin cancer Father   . Emphysema Paternal Grandfather   . Liver cancer Mother   . Stomach cancer Maternal Grandfather   :  Social History   Social History  . Marital status: Widowed    Spouse name: N/A  . Number of children: 1  . Years of education: N/A   Occupational History  . Retired    Social History Main Topics  . Smoking status: Former Smoker    Packs/day: 1.00    Years: 60.00    Types: Cigarettes  . Smokeless tobacco: Never Used     Comment: 8-9 cigarettes per day  . Alcohol use 8.4 oz/week    14 Shots of liquor per week     Comment: 08/19/2014 "2 shots brandy q night"  . Drug use: No  . Sexual activity: Not Currently   Other Topics Concern  . Not on file   Social History Narrative  . No narrative on file  :  Pertinent items are noted in HPI.  Exam: Patient Vitals for the past 24 hrs:  BP Temp Temp src Pulse Resp SpO2  07/15/16 0647 (!) 157/72 - - (!) 106 - -  07/15/16 0605 (!) 183/80 99 F (37.2 C) Oral (!) 117 18 95 %  07/14/16 2220 (!) 171/78 - - (!) 107 - -  07/14/16 2146 (!) 186/76 - - - - -  07/14/16 2120 (!) 185/85 98.7 F (37.1 C) Oral (!) 105 18 98 %  07/14/16 1301 (!) 168/63 98.9 F (37.2 C) Oral 95 19 98 %    As above    Recent Labs  07/13/16 1329  07/14/16 0759 07/15/16 0341  WBC 15.5*  --   --  12.1*  HGB 9.7*  < >  7.8* 8.3*  HCT 29.7*  < > 23.6* 26.2*  PLT 387  --   --  378  < > = values in this interval not displayed.  Recent Labs  07/14/16 0321 07/15/16 0341  NA 139 142  K 4.0 3.9  CL 112* 114*  CO2 21* 20*  GLUCOSE 89 76  BUN 28* 16  CREATININE 1.43* 1.19*  CALCIUM 8.1* 8.5*    Blood smear review:  None  Pathology: None     Assessment and Plan:  This visit is a 82 year old white female. She has GI bleeding. This appears to be lower GI bleeding.  She is going for colonoscopy today.  I suspect that she probably has AVM or angiodysplasia. I suspect that she may have diverticulosis. However, I would have thought that this would be more of an issue for her.  Again I don't think any this is related to her lung cancer or her Keytruda. The [place] can certainly cause diarrhea but her diarrhea was self-limited and bloody.  I would be very careful with her hemoglobin. I would have a low threshold to transfuse her. I'm sending off iron studies.  For right now, we will just follow along. I appreciate all great care that she is getting. I note the staff of all [facility]. is doing great job with her.  Lattie Haw, MD  Acts 16:31

## 2016-07-15 NOTE — Progress Notes (Signed)
Initial Nutrition Assessment  DOCUMENTATION CODES:   Underweight, Severe malnutrition in context of chronic illness  INTERVENTION:   -Provide Carnation Instant Breakfast once daily for breakfast, provides 220 kcal and 13g protein -Encourage PO intake, placed dinner and breakfast orders for patient -RD to continue to monitor  NUTRITION DIAGNOSIS:   Malnutrition (Severe) related to cancer and cancer related treatments, chronic illness as evidenced by severe depletion of body fat, severe depletion of muscle mass.  GOAL:   Patient will meet greater than or equal to 90% of their needs  MONITOR:   PO intake, Supplement acceptance, Labs, Weight trends, I & O's  REASON FOR ASSESSMENT:   Malnutrition Screening Tool    ASSESSMENT:   82 year old white female. 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 She has terrible underlying COPD. She is not felt to be an operative candidate. She has responded very nicely to immunotherapy. We have her on Keytruda. She has had 6 cycles. Her last cycle was given on April 26.  Patient in room with daughter at bedside. Pt and family insist that pt has always been thin, "for about 40 years now". Pt states her appetite has been poor for a long time. Pt ate a [place] sandwich and iced tea today for lunch. Pt takes Megace for her appetite. Per request, RD placed dinner and breakfast orders. Pt states she drinks [employer] everyday with her breakfast.  Per chart, pt with insignificant weight loss for time frame. Pt's daughter does state that the patient lost additional weight after lung cancer treatments. Nutrition-Focused physical exam completed. Findings are severe fat depletion, severe muscle depletion, and no edema. Severe depletion is a chronic finding in this patient.  Medications: Megace suspension daily, Remeron tablet daily, Protonix tablet daily Labs reviewed: GFR: 42  Diet Order:  Diet Heart  Room service appropriate? Yes; Fluid consistency: Thin  Skin:  Reviewed, no issues  Last BM:  5/13  Height:   Ht Readings from Last 1 Encounters:  07/15/16 '5\' 7"'$  (1.702 m)    Weight:   Wt Readings from Last 1 Encounters:  07/15/16 91 lb (41.3 kg)    Ideal Body Weight:  61.4 kg  BMI:  Body mass index is 14.25 kg/m.  Estimated Nutritional Needs:   Kcal:  1250-1450  Protein:  60-70g  Fluid:  1.5L/day  EDUCATION NEEDS:   No education needs identified at this time  Clayton Bibles, MS, RD, LDN Pager: [phone] After Hours Pager: [phone]

## 2016-07-15 NOTE — Op Note (Signed)
[facility] Patient Name: Marissa Cruz Procedure Date: 07/15/2016 MRN: [id number] Attending MD: Milus Banister , MD Date of Birth: 09-29-1934 CSN: [id number] Age: 82 Admit Type: Inpatient Procedure:                Colonoscopy Indications:              Hematochezia; colonoscopy 2015 found                            diverticulosis, small polyps and several right                            colon AVMs Providers:                Milus Banister, MD, Elmer Ramp. Tilden Dome, RN,                            William Dalton, Technician, Tinnie Gens, Technician Referring MD:              Medicines:                Monitored Anesthesia Care Complications:            No immediate complications. Estimated blood loss:                            None. Estimated Blood Loss:     Estimated blood loss: none. Procedure:                Pre-Anesthesia Assessment:                           - Prior to the procedure, a History and Physical                            was performed, and patient medications and                            allergies were reviewed. The patient's tolerance of                            previous anesthesia was also reviewed. The risks                            and benefits of the procedure and the sedation                            options and risks were discussed with the patient.                            All questions were answered, and informed consent                            was obtained. Prior Anticoagulants: The patient has                            taken no previous anticoagulant or  antiplatelet                            agents. ASA Grade Assessment: III - A patient with                            severe systemic disease. After reviewing the risks                            and benefits, the patient was deemed in                            satisfactory condition to undergo the procedure.                           After obtaining informed consent, the  colonoscope                            was passed under direct vision. Throughout the                            procedure, the patient's blood pressure, pulse, and                            oxygen saturations were monitored continuously. The                            was introduced through the anus and advanced to the                            the cecum, identified by appendiceal orifice and                            ileocecal valve. The colonoscopy was performed                            without difficulty. The patient tolerated the                            procedure well. The quality of the bowel                            preparation was adequate. The ileocecal valve,                            appendiceal orifice, and rectum were photographed. Scope In: 10:12:21 AM Scope Out: 10:47:45 AM Scope Withdrawal Time: 0 hours 26 minutes 26 seconds  Total Procedure Duration: 0 hours 35 minutes 24 seconds  Findings:      Several small to medium sized AVMs were noted in cecum and proximal       ascending colon. These were all treated with application of APC. Three       of the AVMs bled during treatment and two of those AVMs did not stop       bleeding after flushing,  observation and so I placed a total of three       endoclips to treat the bleeding (two clips on oozing cecal AVM, and one       clip on oozing proximal ascending colon AVM).      Many small and large-mouthed diverticula were found in the left colon. Impression:               - Several proximal colon AVMs, treated with                            combination APC and placement of endoclips. These                            AVMs are very likely the source of her recent                            bleeding.                           - Diverticulosis in the left colon.                           - No specimens collected. Moderate Sedation:      N/A- Per Anesthesia Care Recommendation:           - Patient has a contact number  available for                            emergencies. The signs and symptoms of potential                            delayed complications were discussed with the                            patient. Return to normal activities tomorrow.                            Written discharge instructions were provided to the                            patient.                           - Resume previous diet.                           - Continue present medications.                           - No repeat colonoscopy.                           - Likely safe for d/c tomorrow if no recurrent,                            overt bleeding. Procedure Code(s):        --- Professional ---  45382, Colonoscopy, flexible; with control of                            bleeding, any method Diagnosis Code(s):        --- Professional ---                           K55.20, Angiodysplasia of colon without hemorrhage                           K92.1, Melena (includes Hematochezia)                           K57.30, Diverticulosis of large intestine without                            perforation or abscess without bleeding CPT copyright 2016 American Medical Association. All rights reserved. The codes documented in this report are preliminary and upon coder review may  be revised to meet current compliance requirements. Milus Banister, MD 07/15/2016 10:53:58 AM This report has been signed electronically. Number of Addenda: 0

## 2016-07-15 NOTE — Progress Notes (Signed)
Second MoviPrep dose not given d/t patient's stool currently being clear liquid after first dose of MoviPrep. Patient has had multiple clear, watery stool episodes and is uncertain she can tolerate second dose of MoviPrep. Will continue to monitor.

## 2016-07-15 NOTE — Progress Notes (Signed)
PROGRESS NOTE    Marissa Cruz  WPI:[id number] DOB: 1935-02-22 DOA: 07/13/2016 PCP: Donato Schultz, DO    Brief Narrative:  82 y.o. woman with a history of NSCLC (undergoing active treatment with Dr. Myna Hidalgo), TIA, iron deficiency anemia due to malabsorption, moderate protein calorie malnutrition, HTN, GERD, and anxiety/depression who presented to the ED in [facility] for evaluation of BRBPR, onset earlier today.  The patient developed diarrhea at home and discovered that she was also passing BRBPR.  She does not recall seeing blood clots.  She does not recall if the stools were maroon.  She describes persistent symptoms for at least one hour.  She had one episode of nausea and nonbloody emesis.  No significant abdominal pain.  She became light-headed and dizzy, but she did not have syncope.  No fever.  She called her daughter, who took her to the ED.  She has history of small hemorrhoids.  She has had a single episode of diverticulitis, 40 years ago.  Last colonoscopy with Dr. Russella Dar in 2015.  Multiple AVMs in the ascending colon and at the cecum were found.  Polyps were also removed.  She denies any history of GI bleed prior to today.    She takes Aleve intermittently for pain.  Assessment & Plan:   Principal Problem:   Rectal bleeding Active Problems:   Anemia   CKD (chronic kidney disease) stage 4, GFR 15-29 ml/min (HCC)   Malignant neoplasm of lower lobe of right lung (HCC)   Iron deficiency anemia due to chronic blood loss   GI bleed   Diarrhea   Hematochezia   Angiodysplasia of colon   Acute blood loss anemia  Acute blood loss anemia secondary to acute lower GI bleeding; history of colon AVMs. --Continue to monitor for ongoing blood loss, hemodynamic instability, and transfusion requirement --Continue to HOLD aspirin for now, premarin for now --GI consulted. Patient underwent colonoscopy as of 5/14 with findings of several prox coon AVM's that were treated with APC and  placement of endoclips -per GI, possible d/c 5/15 if no recurrent overt bleeding  Acute on chronic anemia due to blood loss --Monitor for transfusion requirement --Cont iron supplementation as toleration -Recheck CBC in AM  HTN --Continue amlodipine and metoprolol for now - BP remains stable currently  CKD 4, creatinine slightly up from baseline at time of admission --continuing on IVF -Cr has improved to 1.19 -Repeat bmet in AM  Gout --Allopurinol continued -Stable currently  Anxiety/depression --PRN xanax --Remeron, paxil, trazodone continued - Stable  GERD --On PPI at baseline -Remains stable  Recent tobacco cessation --Nicotine patch - Currently stable  Moderate protein calorie malnutrition --Megace supplement has been continued  DVT prophylaxis: SCD's Code Status: DNR Family Communication: Pt in room, family not at bedside Disposition Plan: Possible d/c home in 24hrs  Consultants:   GI  Procedures:   Colonoscopy 5/14  Antimicrobials: Anti-infectives    None      Subjective: Without complaints  Objective: Vitals:   07/15/16 0938 07/15/16 1054 07/15/16 1100 07/15/16 1110  BP: (!) 166/60 (!) 142/52 (!) 143/54 (!) 155/57  Pulse: (!) 105 (!) 102 95 93  Resp: 16 18 (!) 22 (!) 26  Temp: 98.8 F (37.1 C)  97.5 F (36.4 C)   TempSrc: Oral  Oral   SpO2: 97% 100% 100% 97%  Weight: 41.3 kg (91 lb)     Height: 5\' 7"  (1.702 m)       Intake/Output Summary (Last 24 hours)  at 07/15/16 1306 Last data filed at 07/15/16 1053  Gross per 24 hour  Intake          2516.25 ml  Output                0 ml  Net          2516.25 ml   Filed Weights   07/13/16 1257 07/13/16 1757 07/15/16 0938  Weight: 40.8 kg (90 lb) 41.4 kg (91 lb 4.3 oz) 41.3 kg (91 lb)    Examination:  General exam: Awake, laying in bed, in nad Respiratory system: Normal respiratory effort, no wheezing Cardiovascular system: regular rate, s1, s2 Gastrointestinal system:  Soft, nondistended, positive BS Central nervous system: CN2-12 grossly intact, strength intact Extremities: Perfused, no clubbing Skin: Normal skin turgor, no notable skin lesions seen Psychiatry: Mood normal // no visual hallucinations    Data Reviewed: I have personally reviewed following labs and imaging studies  CBC:  Recent Labs Lab 07/13/16 1329 07/13/16 2125 07/14/16 0321 07/14/16 0759 07/15/16 0341  WBC 15.5*  --   --   --  12.1*  NEUTROABS 11.9*  --   --   --   --   HGB 9.7* 8.3* 7.7* 7.8* 8.3*  HCT 29.7* 25.5* 23.1* 23.6* 26.2*  MCV 98.3  --   --   --  97.4  PLT 387  --   --   --  536   Basic Metabolic Panel:  Recent Labs Lab 07/13/16 1329 07/14/16 0321 07/15/16 0341  NA 138 139 142  K 3.7 4.0 3.9  CL 108 112* 114*  CO2 21* 21* 20*  GLUCOSE 141* 89 76  BUN 33* 28* 16  CREATININE 1.65* 1.43* 1.19*  CALCIUM 8.7* 8.1* 8.5*   GFR: Estimated Creatinine Clearance: 25 mL/min (A) (by C-G formula based on SCr of 1.19 mg/dL (H)). Liver Function Tests:  Recent Labs Lab 07/13/16 1329  AST 23  ALT 13*  ALKPHOS 71  BILITOT 0.2*  PROT 6.2*  ALBUMIN 3.1*   No results for input(s): LIPASE, AMYLASE in the last 168 hours. No results for input(s): AMMONIA in the last 168 hours. Coagulation Profile:  Recent Labs Lab 07/13/16 1329  INR 1.00   Cardiac Enzymes: No results for input(s): CKTOTAL, CKMB, CKMBINDEX, TROPONINI in the last 168 hours. BNP (last 3 results) No results for input(s): PROBNP in the last 8760 hours. HbA1C: No results for input(s): HGBA1C in the last 72 hours. CBG: No results for input(s): GLUCAP in the last 168 hours. Lipid Profile: No results for input(s): CHOL, HDL, LDLCALC, TRIG, CHOLHDL, LDLDIRECT in the last 72 hours. Thyroid Function Tests: No results for input(s): TSH, T4TOTAL, FREET4, T3FREE, THYROIDAB in the last 72 hours. Anemia Panel: No results for input(s): VITAMINB12, FOLATE, FERRITIN, TIBC, IRON, RETICCTPCT in the last  72 hours. Sepsis Labs: No results for input(s): PROCALCITON, LATICACIDVEN in the last 168 hours.  No results found for this or any previous visit (from the past 240 hour(s)).   Radiology Studies: No results found.  Scheduled Meds: . allopurinol  100 mg Oral QHS  . amLODipine  5 mg Oral Daily  . ferrous sulfate  325 mg Oral BID PC  . megestrol  400 mg Oral Daily  . metoprolol succinate  25 mg Oral Daily  . mirtazapine  15 mg Oral QHS  . nicotine  14 mg Transdermal Daily  . pantoprazole  40 mg Oral Daily  . PARoxetine  10 mg Oral Daily  .  peg 3350 powder  0.5 kit Oral Once  . sodium chloride flush  3 mL Intravenous Q12H  . traZODone  50 mg Oral QHS   Continuous Infusions: . sodium chloride 75 mL/hr at 07/15/16 0600     LOS: 1 day   Crewe Heathman, Orpah Melter, MD [facility] Pager [phone]  If 7PM-7AM, please contact night-coverage [email or web address] Password [email or web address] 07/15/2016, 1:06 PM

## 2016-07-15 NOTE — Progress Notes (Signed)
    Progress Note   Subjective  Chief Complaint: Hematochezia, Acute on chronic anemia  Pt found laying comfortably in bed, she completed half of the movi prep and was already "running clear" per nursing, so she stopped this, she has had two small clear liquid BM since finishing prep in the early hours of the morning and denies abdominal pain or discomfort. She compares the taste of the prep to "warm cat pee".    Objective   Vital signs in last 24 hours: Temp:  [98.7 F (37.1 C)-99 F (37.2 C)] 99 F (37.2 C) (05/14 0605) Pulse Rate:  [95-117] 116 (05/14 0824) Resp:  [18-19] 18 (05/14 0605) BP: (157-186)/(63-85) 160/64 (05/14 0824) SpO2:  [95 %-98 %] 95 % (05/14 0605) Last BM Date: 07/14/16 General: Elderly Caucasian female in NAD Heart:  Regular rate and rhythm; no murmurs Lungs: Respirations even and unlabored, lungs CTA bilaterally Abdomen:  Soft, nontender and nondistended. Normal bowel sounds. Extremities:  Without edema. Neurologic:  Alert and oriented,  grossly normal neurologically. Psych:  Cooperative. Normal mood and affect.   Lab Results:  Recent Labs  07/13/16 1329  07/14/16 0321 07/14/16 0759 07/15/16 0341  WBC 15.5*  --   --   --  12.1*  HGB 9.7*  < > 7.7* 7.8* 8.3*  HCT 29.7*  < > 23.1* 23.6* 26.2*  PLT 387  --   --   --  378  < > = values in this interval not displayed. BMET  Recent Labs  07/13/16 1329 07/14/16 0321 07/15/16 0341  NA 138 139 142  K 3.7 4.0 3.9  CL 108 112* 114*  CO2 21* 21* 20*  GLUCOSE 141* 89 76  BUN 33* 28* 16  CREATININE 1.65* 1.43* 1.19*  CALCIUM 8.7* 8.1* 8.5*   LFT  Recent Labs  07/13/16 1329  PROT 6.2*  ALBUMIN 3.1*  AST 23  ALT 13*  ALKPHOS 71  BILITOT 0.2*   PT/INR  Recent Labs  07/13/16 1329  LABPROT 13.2  INR 1.00    Assessment / Plan:   Assessment: 1. Hematochezia:no further blood in stools per pt and nursing, h/o AVM hemorrhage in large bowel 2015, colo today 2. Acute on chronic anemia:  hgb stable overnight 3. Inoperable stage IIIB lung cancer, on Keytruda 4. H/o adenomatous polyps without high grade dysplasia Nov 2015 4. HTN  Plan: 1. Colonoscopy planned this morning 10am with Dr. Ardis Hughs. Please await further recommendations after this procedure.  Thank you for your kind consultation, we will continue to follow.    LOS: 1 day   Levin Erp  07/15/2016, 9:20 AM  Pager # [phone]   ________________________________________________________________________  Velora Heckler GI MD note:  I personally examined the patient, reviewed the data and agree with the assessment and plan described above.   Owens Loffler, MD [facility] Pager [phone]

## 2016-07-15 NOTE — Interval H&P Note (Signed)
History and Physical Interval Note:  07/15/2016 9:34 AM  Marissa Cruz  has presented today for surgery, with the diagnosis of rectal bleeding  The various methods of treatment have been discussed with the patient and family. After consideration of risks, benefits and other options for treatment, the patient has consented to  Procedure(s): COLONOSCOPY (N/A) as a surgical intervention .  The patient's history has been reviewed, patient examined, no change in status, stable for surgery.  I have reviewed the patient's chart and labs.  Questions were answered to the patient's satisfaction.     Milus Banister

## 2016-07-16 ENCOUNTER — Encounter (HOSPITAL_COMMUNITY): Payer: Self-pay | Admitting: Gastroenterology

## 2016-07-16 DIAGNOSIS — D62 Acute posthemorrhagic anemia: Secondary | ICD-10-CM

## 2016-07-16 LAB — CBC
HCT: 24.5 % — ABNORMAL LOW (ref 36.0–46.0)
HEMOGLOBIN: 8 g/dL — AB (ref 12.0–15.0)
MCH: 31.7 pg (ref 26.0–34.0)
MCHC: 32.7 g/dL (ref 30.0–36.0)
MCV: 97.2 fL (ref 78.0–100.0)
Platelets: 371 10*3/uL (ref 150–400)
RBC: 2.52 MIL/uL — ABNORMAL LOW (ref 3.87–5.11)
RDW: 14.3 % (ref 11.5–15.5)
WBC: 12.3 10*3/uL — ABNORMAL HIGH (ref 4.0–10.5)

## 2016-07-16 LAB — IRON AND TIBC
IRON: 24 ug/dL — AB (ref 28–170)
SATURATION RATIOS: 11 % (ref 10.4–31.8)
TIBC: 216 ug/dL — AB (ref 250–450)
UIBC: 192 ug/dL

## 2016-07-16 LAB — FERRITIN: FERRITIN: 155 ng/mL (ref 11–307)

## 2016-07-16 MED ORDER — LABETALOL HCL 5 MG/ML IV SOLN
5.0000 mg | Freq: Once | INTRAVENOUS | Status: AC
  Administered 2016-07-16: 5 mg via INTRAVENOUS
  Filled 2016-07-16: qty 4

## 2016-07-16 MED ORDER — HEPARIN SOD (PORK) LOCK FLUSH 100 UNIT/ML IV SOLN
500.0000 [IU] | INTRAVENOUS | Status: AC | PRN
  Administered 2016-07-16: 500 [IU]

## 2016-07-16 NOTE — Care Management Note (Signed)
Case Management Note  Patient Details  Name: Marissa Cruz MRN: [id number] Date of Birth: December 17, 1934  Subjective/Objective: PT-no f/u. Patient already has private duty care. No further CM needs.                   Action/Plan:d/c home.   Expected Discharge Date:                  Expected Discharge Plan:  Home/Self Care  In-House Referral:  NA  Discharge planning Services  CM Consult  Post Acute Care Choice:  Resumption of Svcs/PTA Provider ([place] aide) Choice offered to:     DME Arranged:  N/A DME Agency:  NA  HH Arranged:  NA HH Agency:     Status of Service:  Completed, signed off  If discussed at [place] of Stay Meetings, dates discussed:    Additional Comments:  Dessa Phi, RN 07/16/2016, 11:33 AM

## 2016-07-16 NOTE — Discharge Summary (Signed)
Physician Discharge Summary  Marissa Cruz ATF:[id number] DOB: [birth year] DOA: 07/13/2016  PCP: Ann Held, DO  Admit date: 07/13/2016 Discharge date: 07/16/2016  Admitted From: Home Disposition:  Home  Recommendations for Outpatient Follow-up:  1. Follow up with PCP in 1-2 weeks 2. Please monitor blood pressure closely. Patient's blood pressures have been elevated primarily in the evening/early morning, requiring PRN hydralazine. Suspect patient will benefit from further blood pressure medication adjustment as outpatient  Discharge Condition:Stable CODE STATUS:DNR Diet recommendation: Heart healthy   Brief/Interim Summary: 82 y.o.woman with a history of NSCLC (undergoing active treatment with Dr. Marin Olp), TIA, iron deficiency anemia due to malabsorption, moderate protein calorie malnutrition, HTN, GERD, and anxiety/depression who presented to the ED in [facility] for evaluation of BRBPR, onset earlier today. The patient developed diarrhea at home and discovered that she was also passing BRBPR. She does not recall seeing blood clots. She does not recall if the stools were maroon. She describes persistent symptoms for at least one hour. She had one episode of nausea and nonbloody emesis. No significant abdominal pain. She became light-headed and dizzy, but she did not have syncope. No fever. She called her daughter, who took her to the ED.  She has history of small hemorrhoids. She has had a single episode of diverticulitis, 40 years ago. Last colonoscopy with Dr. Fuller Plan in 2015. Multiple AVMs in the ascending colon and at the cecum were found. Polyps were also removed. She denies any history of GI bleed prior to today.   She takes Aleve intermittently for pain.  Acute blood loss anemia secondary to acute lower GI bleeding; history of colon AVMs. --Held aspirin this admission, premarin for now --GI was consulted. Patient underwent colonoscopy as of 5/14 with  findings of several prox colon AVM's that were treated with APC and placement of endoclips -pt remained hemodynamically stable overnight  Acute on chronic anemia due to blood loss --Continued iron supplementation as tolerated -Hgb remained stable  HTN --Continued amlodipine and metoprolol for now - BP remained largely stable during the day, however note to be poorly controlled at night and early morning - Recommend close outpatient follow up and blood pressure medication adjustments  CKD 4, creatinine slightly up from baseline at time of admission --continued on IVF -Cr has improved to 1.19  Gout --Allopurinol continued -Stable currently  Anxiety/depression --PRN xanax --Remeron, paxil, trazodone continued - Stable  GERD --On PPI at baseline -Remains stable  Recent tobacco cessation --Nicotine patch - Currently stable  Moderate protein calorie malnutrition --Megace supplement has been continued  Discharge Diagnoses:  Principal Problem:   Rectal bleeding Active Problems:   Anemia   CKD (chronic kidney disease) stage 4, GFR 15-29 ml/min (HCC)   Malignant neoplasm of lower lobe of right lung (HCC)   Iron deficiency anemia due to chronic blood loss   GI bleed   Diarrhea   Hematochezia   Angiodysplasia of colon   Acute blood loss anemia    Discharge Instructions   Allergies as of 07/16/2016      Reactions   Morphine And Related Other (See Comments)   Hallucinations and combative   Codeine Nausea Only      Medication List    TAKE these medications   allopurinol 100 MG tablet Commonly known as:  ZYLOPRIM TAKE 1 TABLET BY MOUTH AT BEDTIME   ALPRAZolam 0.25 MG tablet Commonly known as:  XANAX TAKE 1 TABLET BY MOUTH AS NEEDED FOR ANXIETY   amLODipine 5 MG tablet  Commonly known as:  NORVASC take '5mg'$  by mouth daily   aspirin 81 MG tablet Take 81 mg by mouth daily.   cetirizine 10 MG tablet Commonly known as:  ZYRTEC Take 10 mg by mouth at  bedtime.   docusate sodium 100 MG capsule Commonly known as:  COLACE Take 100 mg by mouth 2 (two) times daily.   ferrous sulfate 325 (65 FE) MG EC tablet Take 325 mg by mouth 2 (two) times daily.   HM MULTIVITAMIN ADULT GUMMY Chew Chew 2 tablets by mouth every morning.   lidocaine-prilocaine cream Commonly known as:  EMLA Apply to affected area once   LORazepam 0.5 MG tablet Commonly known as:  ATIVAN Take 1 tablet (0.5 mg total) by mouth every 6 (six) hours as needed (Nausea or vomiting).   megestrol 40 MG/ML suspension Commonly known as:  MEGACE Take 10 mLs (400 mg total) by mouth daily.   metoprolol succinate 25 MG 24 hr tablet Commonly known as:  TOPROL-XL TAKE 1 TABLET(25 MG) BY MOUTH EVERY MORNING   mirtazapine 15 MG disintegrating tablet Commonly known as:  REMERON SOL-TAB DISSOLVE 1 TABLET ON THE TONGUE AT BEDTIME   nicotine 14 mg/24hr patch Commonly known as:  NICODERM CQ - dosed in mg/24 hours Place 14 mg onto the skin daily.   omeprazole 20 MG capsule Commonly known as:  PRILOSEC TAKE ONE CAPSULE BY MOUTH TWICE DAILY BEFORE A MEAL   ondansetron 8 MG disintegrating tablet Commonly known as:  ZOFRAN-ODT Take 1 tablet (8 mg total) by mouth every 8 (eight) hours as needed for nausea or vomiting. Reported on 05/17/2015   PARoxetine 10 MG tablet Commonly known as:  PAXIL TAKE 1 TABLET BY MOUTH DAILY FOR DEPRESSION   [name] 0.625 MG tablet Generic drug:  estrogens (conjugated) TAKE 1 TABLET BY MOUTH EVERY DAY FOR 21 DAYS, THEN DO NOT TAKE FOR 7 DAYS What changed:  See the new instructions.   traZODone 50 MG tablet Commonly known as:  DESYREL TAKE 1 TABLET(50 MG) BY MOUTH AT BEDTIME      Follow-up Information    Ann Held, DO. Schedule an appointment as soon as possible for a visit in 1 week(s).   Specialty:  Family Medicine Contact information: [address] [place] [address] [phone]          Allergies   Allergen Reactions  . Morphine And Related Other (See Comments)    Hallucinations and combative  . Codeine Nausea Only    Consultations:  GI  Procedures/Studies: Colonoscopy 5/14  Subjective: No complaints  Discharge Exam: Vitals:   07/16/16 0902 07/16/16 1416  BP: (!) 156/62 (!) 154/71  Pulse: (!) 115 (!) 111  Resp:  18  Temp:  98.6 F (37 C)   Vitals:   07/15/16 2330 07/16/16 0529 07/16/16 0902 07/16/16 1416  BP: (!) 174/67 (!) 167/76 (!) 156/62 (!) 154/71  Pulse: (!) 106 (!) 113 (!) 115 (!) 111  Resp:  18  18  Temp:  99.2 F (37.3 C)  98.6 F (37 C)  TempSrc:  Oral  Oral  SpO2:  98%  97%  Weight:      Height:        General: Pt is alert, awake, not in acute distress Cardiovascular: RRR, S1/S2 +, no rubs, no gallops Respiratory: CTA bilaterally, no wheezing, no rhonchi Abdominal: Soft, NT, ND, bowel sounds + Extremities: no edema, no cyanosis   The results of significant diagnostics from  this hospitalization (including imaging, microbiology, ancillary and laboratory) are listed below for reference.     Microbiology: No results found for this or any previous visit (from the past 240 hour(s)).   Labs: BNP (last 3 results) No results for input(s): BNP in the last 8760 hours. Basic Metabolic Panel:  Recent Labs Lab 07/13/16 1329 07/14/16 0321 07/15/16 0341  NA 138 139 142  K 3.7 4.0 3.9  CL 108 112* 114*  CO2 21* 21* 20*  GLUCOSE 141* 89 76  BUN 33* 28* 16  CREATININE 1.65* 1.43* 1.19*  CALCIUM 8.7* 8.1* 8.5*   Liver Function Tests:  Recent Labs Lab 07/13/16 1329  AST 23  ALT 13*  ALKPHOS 71  BILITOT 0.2*  PROT 6.2*  ALBUMIN 3.1*   No results for input(s): LIPASE, AMYLASE in the last 168 hours. No results for input(s): AMMONIA in the last 168 hours. CBC:  Recent Labs Lab 07/13/16 1329 07/13/16 2125 07/14/16 0321 07/14/16 0759 07/15/16 0341 07/16/16 0930  WBC 15.5*  --   --   --  12.1* 12.3*  NEUTROABS 11.9*  --   --   --    --   --   HGB 9.7* 8.3* 7.7* 7.8* 8.3* 8.0*  HCT 29.7* 25.5* 23.1* 23.6* 26.2* 24.5*  MCV 98.3  --   --   --  97.4 97.2  PLT 387  --   --   --  378 371   Cardiac Enzymes: No results for input(s): CKTOTAL, CKMB, CKMBINDEX, TROPONINI in the last 168 hours. BNP: Invalid input(s): POCBNP CBG: No results for input(s): GLUCAP in the last 168 hours. D-Dimer No results for input(s): DDIMER in the last 72 hours. Hgb A1c No results for input(s): HGBA1C in the last 72 hours. Lipid Profile No results for input(s): CHOL, HDL, LDLCALC, TRIG, CHOLHDL, LDLDIRECT in the last 72 hours. Thyroid function studies No results for input(s): TSH, T4TOTAL, T3FREE, THYROIDAB in the last 72 hours.  Invalid input(s): FREET3 Anemia work up  Recent Labs  07/16/16 0812  FERRITIN 155  TIBC 216*  IRON 24*   Urinalysis    Component Value Date/Time   BILIRUBINUR neg 11/21/2015 1355   PROTEINUR 30 11/21/2015 1355   UROBILINOGEN 0.2 11/21/2015 1355   NITRITE neg 11/21/2015 1355   LEUKOCYTESUR Negative 11/21/2015 1355   Sepsis Labs Invalid input(s): PROCALCITONIN,  WBC,  LACTICIDVEN Microbiology No results found for this or any previous visit (from the past 240 hour(s)).   SIGNED:   Donne Hazel, MD  [facility] 07/16/2016, 3:20 PM  If 7PM-7AM, please contact night-coverage [email or web address] Password [email or web address]

## 2016-07-16 NOTE — Progress Notes (Signed)
     [facility] Progress Note  Subjective:  Feeling well.  Only issue is some tachycardia and BP has been quite high on occasion.  Had her first BM since colonoscopy just now.  Small amount, no blood.    Objective:  Vital signs in last 24 hours: Temp:  [97.5 F (36.4 C)-99.4 F (37.4 C)] 99.2 F (37.3 C) (05/15 0529) Pulse Rate:  [93-115] 115 (05/15 0902) Resp:  [16-26] 18 (05/15 0529) BP: (128-188)/(52-79) 156/62 (05/15 0902) SpO2:  [96 %-100 %] 98 % (05/15 0529) Weight:  [91 lb (41.3 kg)] 91 lb (41.3 kg) (05/14 0938) Last BM Date: 07/14/16 General:  Alert, very thin, in NAD Heart:  Tachy; no murmurs Pulm:  CTAB.  No increased WOB. Abdomen:  Soft, non-distended.  BS present.  Non-tender. Extremities:  Without edema. Neurologic:  Alert and oriented x 4;  grossly normal neurologically. Psych:  Alert and cooperative. Normal mood and affect.  Lab Results:  Recent Labs  07/13/16 1329  07/14/16 0321 07/14/16 0759 07/15/16 0341  WBC 15.5*  --   --   --  12.1*  HGB 9.7*  < > 7.7* 7.8* 8.3*  HCT 29.7*  < > 23.1* 23.6* 26.2*  PLT 387  --   --   --  378  < > = values in this interval not displayed. BMET  Recent Labs  07/13/16 1329 07/14/16 0321 07/15/16 0341  NA 138 139 142  K 3.7 4.0 3.9  CL 108 112* 114*  CO2 21* 21* 20*  GLUCOSE 141* 89 76  BUN 33* 28* 16  CREATININE 1.65* 1.43* 1.19*  CALCIUM 8.7* 8.1* 8.5*   LFT  Recent Labs  07/13/16 1329  PROT 6.2*  ALBUMIN 3.1*  AST 23  ALT 13*  ALKPHOS 71  BILITOT 0.2*   PT/INR  Recent Labs  07/13/16 1329  LABPROT 13.2  INR 1.00   Assessment / Plan: 1. Hematochezia:  Due to several proximal colon AVM's to which APC and endoclips were applied during colonoscopy 5/14.  No further bleeding. 2. Acute on chronic anemia:  Morning CBC pending. 3. Inoperable stage IIIB lung cancer, on Keytruda 4. H/o adenomatous polyps without high grade dysplasia Nov 2015 4. HTN:  BP has been high on occasions  here.  *If today's Hgb is stable then patient ok for D/C from GI standpoint.  She only needs GI follow-up prn; does not need follow-up from this hospital stay.   LOS: 2 days   ZEHR, JESSICA D.  07/16/2016, 9:12 AM  Pager number [phone]   ________________________________________________________________________  Velora Heckler GI MD note:  I personally examined the patient, reviewed the data and agree with the assessment and plan described above.  Colonoscopy yesterday with treatment of proximal colon AVMs.  Unlikely that she will have further bleeding problems.  Would continue daily iron (once daily if probably sufficient). OK to discharge from GI standpoint.  Please call, page with any questions or concerns.   Owens Loffler, MD [facility] Pager [phone]

## 2016-07-16 NOTE — Evaluation (Signed)
Physical Therapy Evaluation Patient Details Name: Marissa Cruz MRN: [id number] DOB: 1934-04-14 Today's Date: 07/16/2016   History of Present Illness  82 yo female admitted with rectal bleeding. Hx of NSCLC, TIA, anemia, malnutrition  Clinical Impression  On eval, pt was supervision level assist for bed mobility and transfer, bed to recliner. Pt is nonambulatory at baseline. Pt participated well and performed mobility tasks safely and without physical assistance. Do not anticipate any follow up PT needs at discharge-pt is in agreement. Pt reports she has an aide 3 days a week, 2 hours/day. Recommend daily mobility (transfers) with nursing supervision.    Follow Up Recommendations No PT follow up;Supervision - Intermittent    Equipment Recommendations  None recommended by PT    Recommendations for Other Services       Precautions / Restrictions Precautions Precautions: Fall Restrictions Weight Bearing Restrictions: No      Mobility  Bed Mobility Overal bed mobility: Modified Independent             General bed mobility comments: Pt c/o lightheadedness. Sat EOB for ~5 minutes before attempting xfer  Transfers Overall transfer level: Needs assistance   Transfers: Squat Pivot Transfers     Squat pivot transfers: Supervision     General transfer comment: for safety. Pt uses modified technique with support of bil armrests for transfer. No physical assist needed  Ambulation/Gait             General Gait Details: NT-pt is nonambulatory  Stairs            Wheelchair Mobility    Modified Rankin (Stroke Patients Only)       Balance Overall balance assessment: No apparent balance deficits (not formally assessed)                                           Pertinent Vitals/Pain Pain Assessment: No/denies pain    Home Living Family/patient expects to be discharged to:: Private residence Living Arrangements: Alone Available Help at  Discharge: Personal care attendant (MWF, 2 hours) Type of Home: Apartment Home Access: Level entry       Home Equipment: Wheelchair - manual      Prior Function Level of Independence: Needs assistance   Gait / Transfers Assistance Needed: nonambulatory. transfers only.  ADL's / Homemaking Assistance Needed: assist with getting into shower and household chores        Hand Dominance        Extremity/Trunk Assessment   Upper Extremity Assessment Upper Extremity Assessment: Overall WFL for tasks assessed    Lower Extremity Assessment Lower Extremity Assessment:  (Strength at least 3/5 throughout)    Cervical / Trunk Assessment Cervical / Trunk Assessment: Kyphotic  Communication   Communication: No difficulties  Cognition Arousal/Alertness: Awake/alert Behavior During Therapy: WFL for tasks assessed/performed Overall Cognitive Status: Within Functional Limits for tasks assessed                                        General Comments      Exercises     Assessment/Plan    PT Assessment Patient needs continued PT services  PT Problem List Decreased mobility       PT Treatment Interventions Therapeutic activities;Therapeutic exercise;Patient/family education;Functional mobility training;DME instruction    PT Goals (Current  goals can be found in the Care Plan section)  Acute Rehab PT Goals Patient Stated Goal: home soon PT Goal Formulation: With patient Time For Goal Achievement: 07/30/16 Potential to Achieve Goals: Good    Frequency Min 3X/week   Barriers to discharge        Co-evaluation               AM-PAC PT "6 Clicks" Daily Activity  Outcome Measure Difficulty turning over in bed (including adjusting bedclothes, sheets and blankets)?: None Difficulty moving from lying on back to sitting on the side of the bed? : None Difficulty sitting down on and standing up from a chair with arms (e.g., wheelchair, bedside commode,  etc,.)?: A Little Help needed moving to and from a bed to chair (including a wheelchair)?: A Little Help needed walking in hospital room?: Total Help needed climbing 3-5 steps with a railing? : Total 6 Click Score: 16    End of Session Equipment Utilized During Treatment: Gait belt Activity Tolerance: Patient tolerated treatment well Patient left: in chair;with call bell/phone within reach   PT Visit Diagnosis: Muscle weakness (generalized) (M62.81)    Time: 7711-6579 PT Time Calculation (min) (ACUTE ONLY): 12 min   Charges:   PT Evaluation $PT Eval Low Complexity: 1 Procedure     PT G Codes:          Weston Anna, MPT Pager: [phone]

## 2016-07-16 NOTE — Progress Notes (Signed)
Patient discharged home with daughter. Discharge instructions given and explained to patient/daughter and they verbalized understanding, denies any pain/distress. Skin intact, no wound noted. Accompanied home by daughter, transported to the car by staff.

## 2016-07-17 ENCOUNTER — Telehealth: Payer: Self-pay | Admitting: Behavioral Health

## 2016-07-17 NOTE — Telephone Encounter (Signed)
Attempted to reach patient for TCM/Hospital Follow-up call. Left message for patient to return call when available.    

## 2016-07-18 ENCOUNTER — Ambulatory Visit: Payer: Medicare Other | Admitting: Family

## 2016-07-18 ENCOUNTER — Other Ambulatory Visit: Payer: Medicare Other

## 2016-07-18 ENCOUNTER — Ambulatory Visit: Payer: Medicare Other

## 2016-07-18 NOTE — Telephone Encounter (Signed)
Transition Care Management Follow-up Telephone Call  PCP: Ann Held, DO  Admit date: 07/13/2016 Discharge date: 07/16/2016  Admitted From: Home Disposition:  Home  Recommendations for Outpatient Follow-up:  1. Follow up with PCP in 1-2 weeks 2. Please monitor blood pressure closely. Patient's blood pressures have been elevated primarily in the evening/early morning, requiring PRN hydralazine. Suspect patient will benefit from further blood pressure medication adjustment as outpatient  Discharge Condition:Stable   How have you been since you were released from the hospital? Patient stated, "Just fine".   Do you understand why you were in the hospital? yes   Do you understand the discharge instructions? yes   Where were you discharged to? Home   Items Reviewed:  Medications reviewed: yes  Allergies reviewed: yes  Dietary changes reviewed: yes, heart healthy diet  Referrals reviewed: yes, Follow up with PCP in 1-2 weeks.    Functional Questionnaire:   Activities of Daily Living (ADLs):   She states they are independent in the following: ambulation, feeding, continence, grooming, toileting and dressing States they require assistance with the following: bathing and hygiene   Any transportation issues/concerns?: no   Any patient concerns? no   Confirmed importance and date/time of follow-up visits scheduled yes, 07/23/16 at 11:30 AM  Provider Appointment booked with Dr. Carollee Herter.  Confirmed with patient if condition begins to worsen call PCP or go to the ER.  Patient was given the office number and encouraged to call back with question or concerns.  : yes

## 2016-07-23 ENCOUNTER — Ambulatory Visit (INDEPENDENT_AMBULATORY_CARE_PROVIDER_SITE_OTHER): Payer: Medicare Other | Admitting: Family Medicine

## 2016-07-23 ENCOUNTER — Encounter: Payer: Self-pay | Admitting: Family Medicine

## 2016-07-23 VITALS — BP 128/58 | HR 102 | Temp 98.1°F | Resp 16 | Ht 67.0 in | Wt 94.8 lb

## 2016-07-23 DIAGNOSIS — D500 Iron deficiency anemia secondary to blood loss (chronic): Secondary | ICD-10-CM | POA: Diagnosis not present

## 2016-07-23 DIAGNOSIS — I10 Essential (primary) hypertension: Secondary | ICD-10-CM

## 2016-07-23 NOTE — Patient Instructions (Signed)

## 2016-07-23 NOTE — Assessment & Plan Note (Signed)
Well controlled, no changes to meds. Encouraged heart healthy diet such as the DASH diet and exercise as tolerated.  °

## 2016-07-23 NOTE — Assessment & Plan Note (Signed)
Pt has appointment with heme/onc  She prefers to let them check her labs

## 2016-07-23 NOTE — Progress Notes (Signed)
Patient ID: Marissa Cruz, female   DOB: 1934/12/07, 82 y.o.   MRN: [id number]     Subjective:  I acted as a Education administrator for Dr. Carollee Herter.  Guerry Bruin, [place]   Patient ID: Marissa Cruz, female    DOB: October 21, 1934, 82 y.o.   MRN: [id number]  Chief Complaint  Patient presents with  . Hospitalization Follow-up    07/13/16-07/16/16  Rectal bleeding    HPI  Patient is in today for hospital follow up.  She was in from 07/13/16 thru 07/16/16 for rectal bleeding.  Patient state she is feeling much better.  Patient Care Team: Carollee Herter, Alferd Apa, DO as PCP - General (Family Medicine) Ladene Artist, MD as Consulting Physician (Gastroenterology) Michaela Corner, DDS as Consulting Physician (Oral Surgery) Renda Rolls Jennefer Bravo, MD as Referring Physician (Dermatology) Milus Height, MD as Referring Physician (Ophthalmology)   Past Medical History:  Diagnosis Date  . Age-related macular degeneration, dry, right eye   . Age-related macular degeneration, wet, left eye ([place])   . Anemia   . Anxiety   . Arthritis    "thumbs, hands" (08/18/2014)  . CAP (community acquired pneumonia) 08/19/2014  . Carotid artery disease ([place])   . Chicken pox   . Chronic lower back pain   . Colon polyp   . Depression   . Diverticulitis   . GERD (gastroesophageal reflux disease)   . Goals of care, counseling/discussion 03/07/2016  . History of blood transfusion 1976; 2013   "w/hysterectomy; hip OR"  . History of gout   . Hypertension   . Iron deficiency anemia due to chronic blood loss 06/06/2016  . 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 Kidney disease   . 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 TIA (transient ischemic attack) ~ 2009 X 2  . Vocal cord cancer ([place]) ~ 2009   S/P radiation    Past Surgical History:  Procedure Laterality Date  . ABDOMINAL HYSTERECTOMY  1976  . APPENDECTOMY  1954  . CAROTID ENDARTERECTOMY Left ~ 2014  . CATARACT EXTRACTION W/ INTRAOCULAR LENS  IMPLANT,  BILATERAL Bilateral ~ 2009  . COLONOSCOPY N/A 01/31/2014   Procedure: COLONOSCOPY;  Surgeon: Ladene Artist, MD;  Location: [facility];  Service: Endoscopy;  Laterality: N/A;  . COLONOSCOPY N/A 07/15/2016   Procedure: COLONOSCOPY;  Surgeon: Milus Banister, MD;  Location: [facility];  Service: Endoscopy;  Laterality: N/A;  . DILATION AND CURETTAGE OF UTERUS    . IR GENERIC HISTORICAL  03/12/2016   IR US GUIDE VASC ACCESS RIGHT 03/12/2016 Jacqulynn Cadet, MD [facility]  . IR GENERIC HISTORICAL  03/12/2016   IR FLUORO GUIDE PORT INSERTION RIGHT 03/12/2016 Jacqulynn Cadet, MD [facility]  . JOINT REPLACEMENT    . MOUTH FLOOR MASS EXCISION  03/2014   "removed cancer underneath my tongue"  . TONSILLECTOMY  1941  . TOTAL HIP ARTHROPLASTY Left 2013    Family History  Problem Relation Age of Onset  . Hypertension Father   . Skin cancer Father   . Emphysema Paternal Grandfather   . Liver cancer Mother   . Stomach cancer Maternal Grandfather     Social History   Social History  . Marital status: Widowed    Spouse name: N/A  . Number of children: 1  . Years of education: N/A   Occupational History  . Retired    Social History Main Topics  . Smoking status: Former Smoker    Packs/day: 1.00  Years: 60.00    Types: Cigarettes    Quit date: 05/15/2016  . Smokeless tobacco: Never Used     Comment: 8-9 cigarettes per day  . Alcohol use 4.2 oz/week    7 Shots of liquor per week     Comment:  " shots brandy q night"  . Drug use: No  . Sexual activity: Not Currently   Other Topics Concern  . Not on file   Social History Narrative  . No narrative on file    Outpatient Medications Prior to Visit  Medication Sig Dispense Refill  . allopurinol (ZYLOPRIM) 100 MG tablet TAKE 1 TABLET BY MOUTH AT BEDTIME 90 tablet 0  . ALPRAZolam (XANAX) 0.25 MG tablet TAKE 1 TABLET BY MOUTH AS NEEDED FOR ANXIETY 30 tablet 0  . amLODipine (NORVASC) 5 MG tablet take 94m by mouth daily  0  .  aspirin 81 MG tablet Take 81 mg by mouth daily.    . cetirizine (ZYRTEC) 10 MG tablet Take 10 mg by mouth at bedtime.    . docusate sodium (COLACE) 100 MG capsule Take 100 mg by mouth 2 (two) times daily.     . ferrous sulfate 325 (65 FE) MG EC tablet Take 325 mg by mouth 2 (two) times daily.    .Marland Kitchenlidocaine-prilocaine (EMLA) cream Apply to affected area once 30 g 3  . LORazepam (ATIVAN) 0.5 MG tablet Take 1 tablet (0.5 mg total) by mouth every 6 (six) hours as needed (Nausea or vomiting). 30 tablet 0  . megestrol (MEGACE) 40 MG/ML suspension Take 10 mLs (400 mg total) by mouth daily. 480 mL 2  . metoprolol succinate (TOPROL-XL) 25 MG 24 hr tablet TAKE 1 TABLET(25 MG) BY MOUTH EVERY MORNING 90 tablet 0  . mirtazapine (REMERON SOL-TAB) 15 MG disintegrating tablet DISSOLVE 1 TABLET ON THE TONGUE AT BEDTIME 90 tablet 0  . Multiple Vitamins-Minerals (HM MULTIVITAMIN ADULT GUMMY) CHEW Chew 2 tablets by mouth every morning.    . nicotine (NICODERM CQ - DOSED IN MG/24 HOURS) 14 mg/24hr patch Place 14 mg onto the skin daily.    .Marland Kitchenomeprazole (PRILOSEC) 20 MG capsule TAKE ONE CAPSULE BY MOUTH TWICE DAILY BEFORE A MEAL 60 capsule 11  . ondansetron (ZOFRAN-ODT) 8 MG disintegrating tablet Take 1 tablet (8 mg total) by mouth every 8 (eight) hours as needed for nausea or vomiting. Reported on 05/17/2015 90 tablet 5  . PARoxetine (PAXIL) 10 MG tablet TAKE 1 TABLET BY MOUTH DAILY FOR DEPRESSION 90 tablet 0  . [name] 0.625 MG tablet TAKE 1 TABLET BY MOUTH EVERY DAY FOR 21 DAYS, THEN DO NOT TAKE FOR 7 DAYS (Patient taking differently: take .6230mby mouth daily) 90 tablet 0  . traZODone (DESYREL) 50 MG tablet TAKE 1 TABLET(50 MG) BY MOUTH AT BEDTIME 90 tablet 0   Facility-Administered Medications Prior to Visit  Medication Dose Route Frequency Provider Last Rate Last Dose  . sodium chloride flush (NS) 0.9 % injection 10 mL  10 mL Intracatheter PRN EnVolanda NapoleonMD   10 mL at 04/25/16 1355    Allergies    Allergen Reactions  . Morphine And Related Other (See Comments)    Hallucinations and combative  . Codeine Nausea Only    Review of Systems  Constitutional: Negative for fever and malaise/fatigue.  HENT: Negative for congestion.   Eyes: Negative for blurred vision.  Respiratory: Negative for shortness of breath.   Cardiovascular: Negative for chest pain, palpitations and leg swelling.  Gastrointestinal:  Negative for abdominal pain, blood in stool and nausea.  Genitourinary: Negative for dysuria and frequency.  Musculoskeletal: Negative for falls.  Skin: Negative for rash.  Neurological: Negative for dizziness, loss of consciousness and headaches.  Endo/Heme/Allergies: Negative for environmental allergies.  Psychiatric/Behavioral: Negative for depression. The patient is not nervous/anxious.        Objective:    Physical Exam  Constitutional: She is oriented to person, place, and time. She appears well-developed and well-nourished.  HENT:  Head: Normocephalic and atraumatic.  Eyes: Conjunctivae and EOM are normal.  Neck: Normal range of motion. Neck supple. No JVD present. Carotid bruit is not present. No thyromegaly present.  Cardiovascular: Normal rate, regular rhythm and normal heart sounds.   No murmur heard. Pulmonary/Chest: Effort normal and breath sounds normal. No respiratory distress. She has no wheezes. She has no rales. She exhibits no tenderness.  Musculoskeletal: She exhibits no edema.  Neurological: She is alert and oriented to person, place, and time.  Psychiatric: She has a normal mood and affect.  Nursing note and vitals reviewed.   BP (!) 128/58 (BP Location: Right Arm, Cuff Size: Normal)   Pulse (!) 102   Temp 98.1 F (36.7 C) (Oral)   Resp 16   Ht _0  (1.702 m)   Wt 94 lb 12.8 oz (43 kg)   SpO2 96%   BMI 14.85 kg/m  Wt Readings from Last 3 Encounters:  07/23/16 94 lb 12.8 oz (43 kg)  07/15/16 91 lb (41.3 kg)  06/27/16 91 lb 6.4 oz (41.5 kg)    BP Readings from Last 3 Encounters:  07/23/16 (!) 128/58  07/16/16 (!) 132/50  06/27/16 119/64     Immunization History  Administered Date(s) Administered  . Influenza, High Dose Seasonal PF 11/17/2014, 11/21/2015  . Influenza-Unspecified 12/02/2013  . Pneumococcal Conjugate-13 10/21/2013, 11/17/2014  . Pneumococcal Polysaccharide-23 10/21/2013  . Tdap 05/02/2013  . Zoster 11/02/2013    Health Maintenance  Topic Date Due  . INFLUENZA VACCINE  10/02/2016  . TETANUS/TDAP  05/03/2023  . DEXA SCAN  Completed  . PNA vac Low Risk Adult  Completed    Lab Results  Component Value Date   WBC 12.3 (H) 07/16/2016   HGB 8.0 (L) 07/16/2016   HCT 24.5 (L) 07/16/2016   PLT 371 07/16/2016   GLUCOSE 76 07/15/2016   CHOL 183 11/21/2015   TRIG 124.0 11/21/2015   HDL 94.30 11/21/2015   LDLDIRECT 73.2 10/21/2013   LDLCALC 64 11/21/2015   ALT 13 (L) 07/13/2016   AST 23 07/13/2016   NA 142 07/15/2016   K 3.9 07/15/2016   CL 114 (H) 07/15/2016   CREATININE 1.19 (H) 07/15/2016   BUN 16 07/15/2016   CO2 20 (L) 07/15/2016   TSH 3.95 05/24/2014   INR 1.00 07/13/2016    Lab Results  Component Value Date   TSH 3.95 05/24/2014   Lab Results  Component Value Date   WBC 12.3 (H) 07/16/2016   HGB 8.0 (L) 07/16/2016   HCT 24.5 (L) 07/16/2016   MCV 97.2 07/16/2016   PLT 371 07/16/2016   Lab Results  Component Value Date   NA 142 07/15/2016   K 3.9 07/15/2016   CHLORIDE 101 03/07/2016   CO2 20 (L) 07/15/2016   GLUCOSE 76 07/15/2016   BUN 16 07/15/2016   CREATININE 1.19 (H) 07/15/2016   BILITOT 0.2 (L) 07/13/2016   ALKPHOS 71 07/13/2016   AST 23 07/13/2016   ALT 13 (L) 07/13/2016   PROT 6.2 (  L) 07/13/2016   ALBUMIN 3.1 (L) 07/13/2016   CALCIUM 8.5 (L) 07/15/2016   ANIONGAP 8 07/15/2016   EGFR 38 (L) 03/07/2016   GFR 41.62 (L) 12/21/2015   Lab Results  Component Value Date   CHOL 183 11/21/2015   Lab Results  Component Value Date   HDL 94.30 11/21/2015   Lab  Results  Component Value Date   LDLCALC 64 11/21/2015   Lab Results  Component Value Date   TRIG 124.0 11/21/2015   Lab Results  Component Value Date   CHOLHDL 2 11/21/2015   No results found for: HGBA1C       Assessment & Plan:   Problem List Items Addressed This Visit      Unprioritized   HTN (hypertension)    Well controlled, no changes to meds. Encouraged heart healthy diet such as the DASH diet and exercise as tolerated.        Iron deficiency anemia due to chronic blood loss (Chronic)    Pt has appointment with heme/onc  She prefers to let them check her labs       Other Visit Diagnoses    Anemia, blood loss    -  Primary      I am having Ms. Cancro maintain her docusate sodium, aspirin, HM MULTIVITAMIN ADULT GUMMY, omeprazole, ondansetron, megestrol, lidocaine-prilocaine, LORazepam, ALPRAZolam, amLODipine, PARoxetine, metoprolol succinate, [name], mirtazapine, traZODone, allopurinol, cetirizine, nicotine, and ferrous sulfate.  No orders of the defined types were placed in this encounter.   CMA served as Education administrator during this visit. History, Physical and Plan performed by medical provider. Documentation and orders reviewed and attested to.  Ann Held, DO

## 2016-07-25 ENCOUNTER — Encounter (HOSPITAL_COMMUNITY)
Admission: RE | Admit: 2016-07-25 | Discharge: 2016-07-25 | Disposition: A | Discharge status: Home / Self Care | Payer: Medicare Other | Source: Ambulatory Visit | Attending: Hematology & Oncology | Admitting: Hematology & Oncology

## 2016-07-25 DIAGNOSIS — C3431 Malignant neoplasm of lower lobe, right bronchus or lung: Secondary | ICD-10-CM | POA: Diagnosis not present

## 2016-07-25 LAB — GLUCOSE, CAPILLARY: Glucose-Capillary: 81 mg/dL (ref 65–99)

## 2016-07-25 MED ORDER — FLUDEOXYGLUCOSE F - 18 (FDG) INJECTION
4.8700 | Freq: Once | INTRAVENOUS | Status: AC | PRN
  Administered 2016-07-25: 4.87 via INTRAVENOUS

## 2016-07-26 ENCOUNTER — Telehealth: Payer: Self-pay | Admitting: *Deleted

## 2016-07-26 ENCOUNTER — Ambulatory Visit (HOSPITAL_BASED_OUTPATIENT_CLINIC_OR_DEPARTMENT_OTHER): Payer: Medicare Other

## 2016-07-26 ENCOUNTER — Other Ambulatory Visit (HOSPITAL_BASED_OUTPATIENT_CLINIC_OR_DEPARTMENT_OTHER): Payer: Medicare Other

## 2016-07-26 ENCOUNTER — Ambulatory Visit: Payer: Medicare Other

## 2016-07-26 ENCOUNTER — Ambulatory Visit (HOSPITAL_BASED_OUTPATIENT_CLINIC_OR_DEPARTMENT_OTHER): Payer: Medicare Other | Admitting: Hematology & Oncology

## 2016-07-26 VITALS — BP 151/62 | HR 92 | Temp 98.4°F | Resp 16 | Wt 95.0 lb

## 2016-07-26 DIAGNOSIS — D500 Iron deficiency anemia secondary to blood loss (chronic): Secondary | ICD-10-CM

## 2016-07-26 DIAGNOSIS — Z5112 Encounter for antineoplastic immunotherapy: Secondary | ICD-10-CM

## 2016-07-26 DIAGNOSIS — C3431 Malignant neoplasm of lower lobe, right bronchus or lung: Secondary | ICD-10-CM

## 2016-07-26 DIAGNOSIS — R918 Other nonspecific abnormal finding of lung field: Secondary | ICD-10-CM

## 2016-07-26 DIAGNOSIS — K909 Intestinal malabsorption, unspecified: Secondary | ICD-10-CM

## 2016-07-26 LAB — CMP (CANCER CENTER ONLY)
ALBUMIN: 2.9 g/dL — AB (ref 3.3–5.5)
ALT(SGPT): 17 U/L (ref 10–47)
AST: 23 U/L (ref 11–38)
Alkaline Phosphatase: 74 U/L (ref 26–84)
BILIRUBIN TOTAL: 0.4 mg/dL (ref 0.20–1.60)
BUN: 27 mg/dL — AB (ref 7–22)
CHLORIDE: 108 meq/L (ref 98–108)
CO2: 28 meq/L (ref 18–33)
CREATININE: 1.5 mg/dL — AB (ref 0.6–1.2)
Calcium: 9 mg/dL (ref 8.0–10.3)
Glucose, Bld: 86 mg/dL (ref 73–118)
Potassium: 4.2 mEq/L (ref 3.3–4.7)
SODIUM: 139 meq/L (ref 128–145)
TOTAL PROTEIN: 6.2 g/dL — AB (ref 6.4–8.1)

## 2016-07-26 LAB — CBC WITH DIFFERENTIAL (CANCER CENTER ONLY)
BASO#: 0.1 10*3/uL (ref 0.0–0.2)
BASO%: 1 % (ref 0.0–2.0)
EOS%: 8.3 % — AB (ref 0.0–7.0)
Eosinophils Absolute: 1 10*3/uL — ABNORMAL HIGH (ref 0.0–0.5)
HCT: 28 % — ABNORMAL LOW (ref 34.8–46.6)
HGB: 8.9 g/dL — ABNORMAL LOW (ref 11.6–15.9)
LYMPH#: 1.9 10*3/uL (ref 0.9–3.3)
LYMPH%: 16.3 % (ref 14.0–48.0)
MCH: 32.4 pg (ref 26.0–34.0)
MCHC: 31.8 g/dL — ABNORMAL LOW (ref 32.0–36.0)
MCV: 102 fL — ABNORMAL HIGH (ref 81–101)
MONO#: 1.4 10*3/uL — ABNORMAL HIGH (ref 0.1–0.9)
MONO%: 11.8 % (ref 0.0–13.0)
NEUT%: 62.6 % (ref 39.6–80.0)
NEUTROS ABS: 7.2 10*3/uL — AB (ref 1.5–6.5)
PLATELETS: 574 10*3/uL — AB (ref 145–400)
RBC: 2.75 10*6/uL — AB (ref 3.70–5.32)
RDW: 14.7 % (ref 11.1–15.7)
WBC: 11.5 10*3/uL — ABNORMAL HIGH (ref 3.9–10.0)

## 2016-07-26 MED ORDER — SODIUM CHLORIDE 0.9% FLUSH
10.0000 mL | INTRAVENOUS | Status: DC | PRN
  Administered 2016-07-26: 10 mL
  Filled 2016-07-26: qty 10

## 2016-07-26 MED ORDER — SODIUM CHLORIDE 0.9 % IV SOLN
510.0000 mg | Freq: Once | INTRAVENOUS | Status: AC
  Administered 2016-07-26: 510 mg via INTRAVENOUS
  Filled 2016-07-26: qty 17

## 2016-07-26 MED ORDER — HEPARIN SOD (PORK) LOCK FLUSH 100 UNIT/ML IV SOLN
500.0000 [IU] | Freq: Once | INTRAVENOUS | Status: AC | PRN
  Administered 2016-07-26: 500 [IU]
  Filled 2016-07-26: qty 5

## 2016-07-26 MED ORDER — SODIUM CHLORIDE 0.9 % IV SOLN
Freq: Once | INTRAVENOUS | Status: AC
  Administered 2016-07-26: 14:00:00 via INTRAVENOUS

## 2016-07-26 MED ORDER — SODIUM CHLORIDE 0.9 % IV SOLN
200.0000 mg | Freq: Once | INTRAVENOUS | Status: AC
  Administered 2016-07-26: 200 mg via INTRAVENOUS
  Filled 2016-07-26: qty 8

## 2016-07-26 MED ORDER — SODIUM CHLORIDE 0.9 % IV SOLN: Freq: Once | INTRAVENOUS | Status: DC

## 2016-07-26 NOTE — Patient Instructions (Signed)
Implanted Port Home Guide An implanted port is a type of central line that is placed under the skin. Central lines are used to provide IV access when treatment or nutrition needs to be given through a person's veins. Implanted ports are used for long-term IV access. An implanted port may be placed because:  You need IV medicine that would be irritating to the small veins in your hands or arms.  You need long-term IV medicines, such as antibiotics.  You need IV nutrition for a long period.  You need frequent blood draws for lab tests.  You need dialysis.  Implanted ports are usually placed in the chest area, but they can also be placed in the upper arm, the abdomen, or the leg. An implanted port has two main parts:  Reservoir. The reservoir is round and will appear as a small, raised area under your skin. The reservoir is the part where a needle is inserted to give medicines or draw blood.  Catheter. The catheter is a thin, flexible tube that extends from the reservoir. The catheter is placed into a large vein. Medicine that is inserted into the reservoir goes into the catheter and then into the vein.  How will I care for my incision site? Do not get the incision site wet. Bathe or shower as directed by your health care provider. How is my port accessed? Special steps must be taken to access the port:  Before the port is accessed, a numbing cream can be placed on the skin. This helps numb the skin over the port site.  Your health care provider uses a sterile technique to access the port. ? Your health care provider must put on a mask and sterile gloves. ? The skin over your port is cleaned carefully with an antiseptic and allowed to dry. ? The port is gently pinched between sterile gloves, and a needle is inserted into the port.  Only "non-coring" port needles should be used to access the port. Once the port is accessed, a blood return should be checked. This helps ensure that the port  is in the vein and is not clogged.  If your port needs to remain accessed for a constant infusion, a clear (transparent) bandage will be placed over the needle site. The bandage and needle will need to be changed every week, or as directed by your health care provider.  Keep the bandage covering the needle clean and dry. Do not get it wet. Follow your health care provider's instructions on how to take a shower or bath while the port is accessed.  If your port does not need to stay accessed, no bandage is needed over the port.  What is flushing? Flushing helps keep the port from getting clogged. Follow your health care provider's instructions on how and when to flush the port. Ports are usually flushed with saline solution or a medicine called heparin. The need for flushing will depend on how the port is used.  If the port is used for intermittent medicines or blood draws, the port will need to be flushed: ? After medicines have been given. ? After blood has been drawn. ? As part of routine maintenance.  If a constant infusion is running, the port may not need to be flushed.  How long will my port stay implanted? The port can stay in for as long as your health care provider thinks it is needed. When it is time for the port to come out, surgery will be   done to remove it. The procedure is similar to the one performed when the port was put in. When should I seek immediate medical care? When you have an implanted port, you should seek immediate medical care if:  You notice a bad smell coming from the incision site.  You have swelling, redness, or drainage at the incision site.  You have more swelling or pain at the port site or the surrounding area.  You have a fever that is not controlled with medicine.  This information is not intended to replace advice given to you by your health care provider. Make sure you discuss any questions you have with your health care provider. Document  Released: 02/18/2005 Document Revised: 07/27/2015 Document Reviewed: 10/26/2012 [employer] Interactive Patient Education  2017 [employer]  

## 2016-07-26 NOTE — Patient Instructions (Signed)
Ferumoxytol injection What is this medicine? FERUMOXYTOL is an iron complex. Iron is used to make healthy red blood cells, which carry oxygen and nutrients throughout the body. This medicine is used to treat iron deficiency anemia in people with chronic kidney disease. This medicine may be used for other purposes; ask your health care provider or pharmacist if you have questions. COMMON BRAND NAME(S): Feraheme What should I tell my health care provider before I take this medicine? They need to know if you have any of these conditions: -anemia not caused by low iron levels -high levels of iron in the blood -magnetic resonance imaging (MRI) test scheduled -an unusual or allergic reaction to iron, other medicines, foods, dyes, or preservatives -pregnant or trying to get pregnant -breast-feeding How should I use this medicine? This medicine is for injection into a vein. It is given by a health care professional in a hospital or clinic setting. Talk to your pediatrician regarding the use of this medicine in children. Special care may be needed. Overdosage: If you think you have taken too much of this medicine contact a poison control center or emergency room at once. NOTE: This medicine is only for you. Do not share this medicine with others. What if I miss a dose? It is important not to miss your dose. Call your doctor or health care professional if you are unable to keep an appointment. What may interact with this medicine? This medicine may interact with the following medications: -other iron products This list may not describe all possible interactions. Give your health care provider a list of all the medicines, herbs, non-prescription drugs, or dietary supplements you use. Also tell them if you smoke, drink alcohol, or use illegal drugs. Some items may interact with your medicine. What should I watch for while using this medicine? Visit your doctor or healthcare professional regularly. Tell  your doctor or healthcare professional if your symptoms do not start to get better or if they get worse. You may need blood work done while you are taking this medicine. You may need to follow a special diet. Talk to your doctor. Foods that contain iron include: whole grains/cereals, dried fruits, beans, or peas, leafy green vegetables, and organ meats (liver, kidney). What side effects may I notice from receiving this medicine? Side effects that you should report to your doctor or health care professional as soon as possible: -allergic reactions like skin rash, itching or hives, swelling of the face, lips, or tongue -breathing problems -changes in blood pressure -feeling faint or lightheaded, falls -fever or chills -flushing, sweating, or hot feelings -swelling of the ankles or feet Side effects that usually do not require medical attention (report to your doctor or health care professional if they continue or are bothersome): -diarrhea -headache -nausea, vomiting -stomach pain This list may not describe all possible side effects. Call your doctor for medical advice about side effects. You may report side effects to FDA at 1-800-FDA-1088. Where should I keep my medicine? This drug is given in a hospital or clinic and will not be stored at home. NOTE: This sheet is a summary. It may not cover all possible information. If you have questions about this medicine, talk to your doctor, pharmacist, or health care provider.  2018 Elsevier/Gold Standard (2015-03-23 12:41:49) Pembrolizumab injection What is this medicine? PEMBROLIZUMAB (pem broe liz ue mab) is a monoclonal antibody. It is used to treat melanoma, head and neck cancer, Hodgkin lymphoma, non-small cell lung cancer, urothelial cancer, stomach cancer,  and cancers that have a certain genetic condition. This medicine may be used for other purposes; ask your health care provider or pharmacist if you have questions. COMMON BRAND NAME(S):  Keytruda What should I tell my health care provider before I take this medicine? They need to know if you have any of these conditions: -diabetes -immune system problems -inflammatory bowel disease -liver disease -lung or breathing disease -lupus -organ transplant -an unusual or allergic reaction to pembrolizumab, other medicines, foods, dyes, or preservatives -pregnant or trying to get pregnant -breast-feeding How should I use this medicine? This medicine is for infusion into a vein. It is given by a health care professional in a hospital or clinic setting. A special MedGuide will be given to you before each treatment. Be sure to read this information carefully each time. Talk to your pediatrician regarding the use of this medicine in children. While this drug may be prescribed for selected conditions, precautions do apply. Overdosage: If you think you have taken too much of this medicine contact a poison control center or emergency room at once. NOTE: This medicine is only for you. Do not share this medicine with others. What if I miss a dose? It is important not to miss your dose. Call your doctor or health care professional if you are unable to keep an appointment. What may interact with this medicine? Interactions have not been studied. Give your health care provider a list of all the medicines, herbs, non-prescription drugs, or dietary supplements you use. Also tell them if you smoke, drink alcohol, or use illegal drugs. Some items may interact with your medicine. This list may not describe all possible interactions. Give your health care provider a list of all the medicines, herbs, non-prescription drugs, or dietary supplements you use. Also tell them if you smoke, drink alcohol, or use illegal drugs. Some items may interact with your medicine. What should I watch for while using this medicine? Your condition will be monitored carefully while you are receiving this medicine. You may  need blood work done while you are taking this medicine. Do not become pregnant while taking this medicine or for 4 months after stopping it. Women should inform their doctor if they wish to become pregnant or think they might be pregnant. There is a potential for serious side effects to an unborn child. Talk to your health care professional or pharmacist for more information. Do not breast-feed an infant while taking this medicine or for 4 months after the last dose. What side effects may I notice from receiving this medicine? Side effects that you should report to your doctor or health care professional as soon as possible: -allergic reactions like skin rash, itching or hives, swelling of the face, lips, or tongue -bloody or black, tarry -breathing problems -changes in vision -chest pain -chills -constipation -cough -dizziness or feeling faint or lightheaded -fast or irregular heartbeat -fever -flushing -hair loss -low blood counts - this medicine may decrease the number of white blood cells, red blood cells and platelets. You may be at increased risk for infections and bleeding. -muscle pain -muscle weakness -persistent headache -signs and symptoms of high blood sugar such as dizziness; dry mouth; dry skin; fruity breath; nausea; stomach pain; increased hunger or thirst; increased urination -signs and symptoms of kidney injury like trouble passing urine or change in the amount of urine -signs and symptoms of liver injury like dark urine, light-colored stools, loss of appetite, nausea, right upper belly pain, yellowing of the eyes  or skin -stomach pain -sweating -weight loss Side effects that usually do not require medical attention (report to your doctor or health care professional if they continue or are bothersome): -decreased appetite -diarrhea -tiredness This list may not describe all possible side effects. Call your doctor for medical advice about side effects. You may report  side effects to FDA at 1-800-FDA-1088. Where should I keep my medicine? This drug is given in a hospital or clinic and will not be stored at home. NOTE: This sheet is a summary. It may not cover all possible information. If you have questions about this medicine, talk to your doctor, pharmacist, or health care provider.  2018 Elsevier/Gold Standard (2015-11-28 12:29:36)

## 2016-07-26 NOTE — Telephone Encounter (Signed)
PAtient and daughter here in office to review PET scan results

## 2016-07-26 NOTE — Progress Notes (Signed)
Hematology and Oncology Follow Up Visit  Marissa Cruz [id number] 06-Aug-1934 82 y.o. 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 She was recently hospitalized with lower GI bleeding. She underwent endoscopy. This appeared to be self-limited. She underwent a colonoscopy. She had some AVMS. These are treated with APC and clips.  She did not require any transfusions. She did not receive any iron.  We declined to a PET scan on her. This was done on May 24. This showed some improvement in the right lower lobe lesion. It now measures 1.3 x 1.7 cm. It previously measured 2.1 x 1.77 m. However, it did seem to be some activity in a precarinal and subcarinal node. These were slightly more active. There is still sub-centimeter. No diseases noted elsewhere.  Her appetite is doing okay. She still does not eat all that much.  She's had no fever. She's had no pain issues. He's had no leg swelling.  Thankfully, she's not had any exacerbations of her underlying COPD.   Overall, her performance status is ECOG 2.    Medications:  Allergies as of 07/26/2016      Reactions   Morphine And Related Other (See Comments)   Hallucinations and combative   Codeine Nausea Only      Medication List       Accurate as of 07/26/16  1:30 PM. Always use your most recent med list.          allopurinol 100 MG tablet Commonly known as:  ZYLOPRIM TAKE 1 TABLET BY MOUTH AT BEDTIME   ALPRAZolam 0.25 MG tablet Commonly known as:  XANAX TAKE 1 TABLET BY MOUTH AS NEEDED FOR ANXIETY   amLODipine 5 MG tablet Commonly known as:  NORVASC take 74m by mouth daily   aspirin 81 MG tablet Take 81  mg by mouth daily.   cetirizine 10 MG tablet Commonly known as:  ZYRTEC Take 10 mg by mouth at bedtime.   docusate sodium 100 MG capsule Commonly known as:  COLACE Take 100 mg by mouth 2 (two) times daily.   ferrous sulfate 325 (65 FE) MG EC tablet Take 325 mg by mouth 2 (two) times daily.   HM MULTIVITAMIN ADULT GUMMY Chew Chew 2 tablets by mouth every morning.   lidocaine-prilocaine cream Commonly known as:  EMLA Apply to affected area once   LORazepam 0.5 MG tablet Commonly known as:  ATIVAN Take 1 tablet (0.5 mg total) by mouth every 6 (six) hours as needed (Nausea or vomiting).   megestrol 40 MG/ML suspension Commonly known as:  MEGACE Take 10 mLs (400 mg total) by mouth daily.   metoprolol succinate 25 MG 24 hr tablet Commonly known as:  TOPROL-XL TAKE 1 TABLET(25 MG) BY MOUTH EVERY MORNING   mirtazapine 15 MG disintegrating tablet Commonly known as:  REMERON SOL-TAB DISSOLVE 1 TABLET ON THE TONGUE AT BEDTIME   nicotine 14 mg/24hr patch Commonly known as:  NICODERM CQ - dosed in mg/24 hours Place 14 mg onto the skin daily.   omeprazole 20 MG capsule Commonly known as:  PRILOSEC TAKE ONE CAPSULE BY MOUTH TWICE DAILY BEFORE A MEAL   ondansetron 8 MG disintegrating tablet Commonly known  as:  ZOFRAN-ODT Take 1 tablet (8 mg total) by mouth every 8 (eight) hours as needed for nausea or vomiting. Reported on 05/17/2015   PARoxetine 10 MG tablet Commonly known as:  PAXIL TAKE 1 TABLET BY MOUTH DAILY FOR DEPRESSION   [name] 0.625 MG tablet Generic drug:  estrogens (conjugated) TAKE 1 TABLET BY MOUTH EVERY DAY FOR 21 DAYS, THEN DO NOT TAKE FOR 7 DAYS   traZODone 50 MG tablet Commonly known as:  DESYREL TAKE 1 TABLET(50 MG) BY MOUTH AT BEDTIME       Allergies:  Allergies  Allergen Reactions  . Morphine And Related Other (See Comments)    Hallucinations and combative  . Codeine Nausea Only    Past Medical History, Surgical history, Social history, and  Family History were reviewed and updated.  Review of Systems: All other 10 point review of systems is negative.   Physical Exam:  weight is 95 lb (43.1 kg). Her oral temperature is 98.4 F (36.9 C). Her blood pressure is 151/62 (abnormal) and her pulse is 92. Her respiration is 16 and oxygen saturation is 98%.   Wt Readings from Last 3 Encounters:  07/26/16 95 lb (43.1 kg)  07/23/16 94 lb 12.8 oz (43 kg)  07/15/16 91 lb (41.3 kg)    Thin white female in no obvious distress. Head and neck exam shows no ocular or oral lesions. She has no palpable cervical or supraclavicular lymph nodes. Lungs are clear. There may be some slight decrease over on the right side. Cardiac exam regular rate and rhythm with no murmurs, rubs or bruits. Abdomen is soft. She has good bowel sounds. There is no fluid wave. There is no palpable liver or spleen tip. Back exam shows no tenderness over the spine, ribs or hips. Externally shows no clubbing, cyanosis or edema. Neurological exam shows no focal neurological deficits. Skin exam shows no rashes, ecchymoses or petechia. eferred  Lab Results  Component Value Date   WBC 11.5 (H) 07/26/2016   HGB 8.9 (L) 07/26/2016   HCT 28.0 (L) 07/26/2016   MCV 102 (H) 07/26/2016   PLT 574 (H) 07/26/2016   Lab Results  Component Value Date   FERRITIN 155 07/16/2016   IRON 24 (L) 07/16/2016   TIBC 216 (L) 07/16/2016   UIBC 192 07/16/2016   IRONPCTSAT 11 07/16/2016   Lab Results  Component Value Date   RBC 2.75 (L) 07/26/2016   No results found for: KPAFRELGTCHN, LAMBDASER, KAPLAMBRATIO No results found for: IGGSERUM, IGA, IGMSERUM No results found for: Odetta Pink, SPEI   Chemistry      Component Value Date/Time   NA 139 07/26/2016 1141   NA 136 03/07/2016 1055   K 4.2 07/26/2016 1141   K 4.1 03/07/2016 1055   CL 108 07/26/2016 1141   CO2 28 07/26/2016 1141   CO2 26 03/07/2016 1055   BUN 27 (H) 07/26/2016  1141   BUN 28.7 (H) 03/07/2016 1055   CREATININE 1.5 (H) 07/26/2016 1141   CREATININE 1.3 (H) 03/07/2016 1055      Component Value Date/Time   CALCIUM 9.0 07/26/2016 1141   CALCIUM 9.5 03/07/2016 1055   ALKPHOS 74 07/26/2016 1141   ALKPHOS 83 03/07/2016 1055   AST 23 07/26/2016 1141   AST 17 03/07/2016 1055   ALT 17 07/26/2016 1141   ALT 9 03/07/2016 1055   BILITOT 0.40 07/26/2016 1141   BILITOT 0.26 03/07/2016 1055     Impression and  Plan: Marissa Cruz is a 44 to white female with locally advanced, inoperable, non-small cell lung cancer. She is not a candidate for chemotherapy, radiation therapy or surgery. Thankfully, she has a high PD-L1 expression and is candidate for immunotherapy. So far she has done well and had a nice response noted on her PET scan.   I feel bad that she had this GI bleeding.  She deathly needs some IV iron. I don't that she is on some oral iron but I don't know how well she is able to absorb this. We will give her a dose of IV iron today.  We probably do not have to do another PET scan for another 3 months.  I'm just glad that overall, her quality of life seems to be doing better.  We will plan to get her back in 3 weeks for her next dose of Keytruda.   Volanda Napoleon, MD 5/25/20181:30 PM

## 2016-07-26 NOTE — Telephone Encounter (Signed)
-----   Message from Volanda Napoleon, MD sent at 07/25/2016  5:12 PM EDT ----- Call her dgtr - overall, the response is stable!!!  No new areas of cancer!!  pete

## 2016-07-30 ENCOUNTER — Other Ambulatory Visit: Payer: Self-pay | Admitting: Family Medicine

## 2016-08-01 ENCOUNTER — Inpatient Hospital Stay: Payer: Medicare Other | Admitting: Family Medicine

## 2016-08-08 ENCOUNTER — Ambulatory Visit: Payer: Medicare Other

## 2016-08-08 ENCOUNTER — Other Ambulatory Visit: Payer: Medicare Other

## 2016-08-08 ENCOUNTER — Ambulatory Visit: Payer: Medicare Other | Admitting: Hematology & Oncology

## 2016-08-15 ENCOUNTER — Ambulatory Visit (HOSPITAL_BASED_OUTPATIENT_CLINIC_OR_DEPARTMENT_OTHER): Payer: Medicare Other

## 2016-08-15 ENCOUNTER — Ambulatory Visit: Payer: Medicare Other

## 2016-08-15 ENCOUNTER — Ambulatory Visit (HOSPITAL_BASED_OUTPATIENT_CLINIC_OR_DEPARTMENT_OTHER)
Admission: RE | Admit: 2016-08-15 | Discharge: 2016-08-15 | Disposition: A | Discharge status: Home / Self Care | Payer: Medicare Other | Source: Ambulatory Visit | Attending: Hematology & Oncology | Admitting: Hematology & Oncology

## 2016-08-15 ENCOUNTER — Ambulatory Visit (HOSPITAL_BASED_OUTPATIENT_CLINIC_OR_DEPARTMENT_OTHER): Payer: Medicare Other | Admitting: Hematology & Oncology

## 2016-08-15 ENCOUNTER — Other Ambulatory Visit (HOSPITAL_BASED_OUTPATIENT_CLINIC_OR_DEPARTMENT_OTHER): Payer: Medicare Other

## 2016-08-15 DIAGNOSIS — C3431 Malignant neoplasm of lower lobe, right bronchus or lung: Secondary | ICD-10-CM

## 2016-08-15 DIAGNOSIS — R918 Other nonspecific abnormal finding of lung field: Secondary | ICD-10-CM

## 2016-08-15 DIAGNOSIS — Z981 Arthrodesis status: Secondary | ICD-10-CM | POA: Diagnosis not present

## 2016-08-15 DIAGNOSIS — D500 Iron deficiency anemia secondary to blood loss (chronic): Secondary | ICD-10-CM | POA: Diagnosis not present

## 2016-08-15 DIAGNOSIS — M50223 Other cervical disc displacement at C6-C7 level: Secondary | ICD-10-CM | POA: Diagnosis not present

## 2016-08-15 DIAGNOSIS — Z5112 Encounter for antineoplastic immunotherapy: Secondary | ICD-10-CM | POA: Diagnosis present

## 2016-08-15 DIAGNOSIS — M50321 Other cervical disc degeneration at C4-C5 level: Secondary | ICD-10-CM | POA: Diagnosis not present

## 2016-08-15 DIAGNOSIS — M542 Cervicalgia: Secondary | ICD-10-CM

## 2016-08-15 DIAGNOSIS — M50221 Other cervical disc displacement at C4-C5 level: Secondary | ICD-10-CM | POA: Diagnosis not present

## 2016-08-15 LAB — CMP (CANCER CENTER ONLY)
ALBUMIN: 3.1 g/dL — AB (ref 3.3–5.5)
ALT(SGPT): 17 U/L (ref 10–47)
AST: 20 U/L (ref 11–38)
Alkaline Phosphatase: 77 U/L (ref 26–84)
BUN: 40 mg/dL — AB (ref 7–22)
CHLORIDE: 110 meq/L — AB (ref 98–108)
CO2: 22 mEq/L (ref 18–33)
CREATININE: 1.5 mg/dL — AB (ref 0.6–1.2)
Calcium: 8.9 mg/dL (ref 8.0–10.3)
Glucose, Bld: 113 mg/dL (ref 73–118)
Potassium: 4 mEq/L (ref 3.3–4.7)
SODIUM: 140 meq/L (ref 128–145)
TOTAL PROTEIN: 6.3 g/dL — AB (ref 6.4–8.1)
Total Bilirubin: 0.5 mg/dl (ref 0.20–1.60)

## 2016-08-15 LAB — CBC WITH DIFFERENTIAL (CANCER CENTER ONLY)
BASO#: 0.1 10*3/uL (ref 0.0–0.2)
BASO%: 1 % (ref 0.0–2.0)
EOS%: 3.4 % (ref 0.0–7.0)
Eosinophils Absolute: 0.4 10*3/uL (ref 0.0–0.5)
HCT: 32.1 % — ABNORMAL LOW (ref 34.8–46.6)
HEMOGLOBIN: 10.2 g/dL — AB (ref 11.6–15.9)
LYMPH#: 2.1 10*3/uL (ref 0.9–3.3)
LYMPH%: 17.4 % (ref 14.0–48.0)
MCH: 32.6 pg (ref 26.0–34.0)
MCHC: 31.8 g/dL — ABNORMAL LOW (ref 32.0–36.0)
MCV: 103 fL — ABNORMAL HIGH (ref 81–101)
MONO#: 1.2 10*3/uL — ABNORMAL HIGH (ref 0.1–0.9)
MONO%: 9.8 % (ref 0.0–13.0)
NEUT%: 68.4 % (ref 39.6–80.0)
NEUTROS ABS: 8.2 10*3/uL — AB (ref 1.5–6.5)
PLATELETS: 417 10*3/uL — AB (ref 145–400)
RBC: 3.13 10*6/uL — AB (ref 3.70–5.32)
RDW: 13.4 % (ref 11.1–15.7)
WBC: 11.9 10*3/uL — AB (ref 3.9–10.0)

## 2016-08-15 LAB — IRON AND TIBC
%SAT: 30 % (ref 21–57)
Iron: 74 ug/dL (ref 41–142)
TIBC: 246 ug/dL (ref 236–444)
UIBC: 172 ug/dL (ref 120–384)

## 2016-08-15 LAB — FERRITIN: Ferritin: 366 ng/ml — ABNORMAL HIGH (ref 9–269)

## 2016-08-15 MED ORDER — SODIUM CHLORIDE 0.9 % IV SOLN
200.0000 mg | Freq: Once | INTRAVENOUS | Status: AC
  Administered 2016-08-15: 200 mg via INTRAVENOUS
  Filled 2016-08-15: qty 8

## 2016-08-15 MED ORDER — HEPARIN SOD (PORK) LOCK FLUSH 100 UNIT/ML IV SOLN
500.0000 [IU] | Freq: Once | INTRAVENOUS | Status: AC | PRN
  Administered 2016-08-15: 500 [IU]
  Filled 2016-08-15: qty 5

## 2016-08-15 MED ORDER — SODIUM CHLORIDE 0.9% FLUSH
10.0000 mL | INTRAVENOUS | Status: DC | PRN
  Administered 2016-08-15: 10 mL
  Filled 2016-08-15: qty 10

## 2016-08-15 MED ORDER — SODIUM CHLORIDE 0.9 % IV SOLN
Freq: Once | INTRAVENOUS | Status: AC
  Administered 2016-08-15: 13:00:00 via INTRAVENOUS

## 2016-08-15 NOTE — Progress Notes (Signed)
Hematology and Oncology Follow Up Visit  Marissa Cruz [id number] 03/17/1934 82 y.o. 08/15/2016   Principle Diagnosis:  Stage IIIB ([id number]) poorly differentiated squamous cell carcinoma-unresectable -- HIGH PD-L1 (95%) Iron deficiency anemia - malabsorption  Current Therapy:   Keytruda every 3 week dosing - status post cycle #5  IV feraheme - dose given on 06/06/2016      Interim History: Marissa Cruz is here today with her daughter for follow-up. Her main problem has been neck pain. She's had this for about a week and a half. This mostly is on the right side. It does not radiate. She says she has a more difficult time chewing food. She does have partial dentures. I will know she may have some type of TMJ problem. She has not seen a dentist. I think some plain x-rays would be reasonable.  Otherwise, she sees be doing pretty well. He's had no cough. She's had no increased shortness of breath.  Her appetite is about the same. Her weight is about the same.  She's had no fever. There's been no further GI bleeding.   Overall, her performance status is ECOG 2.    Medications:  Allergies as of 08/15/2016      Reactions   Morphine And Related Other (See Comments)   Hallucinations and combative   Codeine Nausea Only      Medication List       Accurate as of 08/15/16 12:08 PM. Always use your most recent med list.          allopurinol 100 MG tablet Commonly known as:  ZYLOPRIM TAKE 1 TABLET BY MOUTH AT BEDTIME   ALPRAZolam 0.25 MG tablet Commonly known as:  XANAX TAKE 1 TABLET BY MOUTH AS NEEDED FOR ANXIETY   amLODipine 5 MG tablet Commonly known as:  NORVASC take 64m by mouth daily   aspirin 81 MG tablet Take 81 mg by mouth daily.   cetirizine 10 MG tablet Commonly known as:  ZYRTEC Take 10 mg by mouth at bedtime.   docusate sodium 100 MG capsule Commonly known as:  COLACE Take 100 mg by mouth 2 (two) times daily.   ferrous sulfate 325 (65 FE) MG EC tablet Take  325 mg by mouth 2 (two) times daily.   HM MULTIVITAMIN ADULT GUMMY Chew Chew 2 tablets by mouth every morning.   lidocaine-prilocaine cream Commonly known as:  EMLA Apply to affected area once   LORazepam 0.5 MG tablet Commonly known as:  ATIVAN Take 1 tablet (0.5 mg total) by mouth every 6 (six) hours as needed (Nausea or vomiting).   megestrol 40 MG/ML suspension Commonly known as:  MEGACE Take 10 mLs (400 mg total) by mouth daily.   metoprolol succinate 25 MG 24 hr tablet Commonly known as:  TOPROL-XL TAKE 1 TABLET(25 MG) BY MOUTH EVERY MORNING   mirtazapine 15 MG disintegrating tablet Commonly known as:  REMERON SOL-TAB DISSOLVE 1 TABLET ON THE TONGUE AT BEDTIME   nicotine 14 mg/24hr patch Commonly known as:  NICODERM CQ - dosed in mg/24 hours Place 14 mg onto the skin daily.   omeprazole 20 MG capsule Commonly known as:  PRILOSEC TAKE 1 CAPSULE BY MOUTH TWICE DAILY BEFORE A MEAL   ondansetron 8 MG disintegrating tablet Commonly known as:  ZOFRAN-ODT Take 1 tablet (8 mg total) by mouth every 8 (eight) hours as needed for nausea or vomiting. Reported on 05/17/2015   PARoxetine 10 MG tablet Commonly known as:  PAXIL TAKE 1 TABLET BY MOUTH  DAILY FOR DEPRESSION   [name] 0.625 MG tablet Generic drug:  estrogens (conjugated) TAKE 1 TABLET BY MOUTH EVERY DAY FOR 21 DAYS, THEN DO NOT TAKE FOR 7 DAYS   traZODone 50 MG tablet Commonly known as:  DESYREL TAKE 1 TABLET(50 MG) BY MOUTH AT BEDTIME       Allergies:  Allergies  Allergen Reactions  . Morphine And Related Other (See Comments)    Hallucinations and combative  . Codeine Nausea Only    Past Medical History, Surgical history, Social history, and Family History were reviewed and updated.  Review of Systems: All other 10 point review of systems is negative.   Physical Exam:  vitals were not taken for this visit.  Wt Readings from Last 3 Encounters:  08/15/16 94 lb 8 oz (42.9 kg)  07/26/16 95 lb  (43.1 kg)  07/23/16 94 lb 12.8 oz (43 kg)    Thin white female in no obvious distress. Head and neck exam shows no ocular or oral lesions. She has no palpable cervical or supraclavicular lymph nodes. Lungs are clear. There may be some slight decrease over on the right side. Cardiac exam regular rate and rhythm with no murmurs, rubs or bruits. Abdomen is soft. She has good bowel sounds. There is no fluid wave. There is no palpable liver or spleen tip. Back exam shows no tenderness over the spine, ribs or hips. Externally shows no clubbing, cyanosis or edema. Neurological exam shows no focal neurological deficits. Skin exam shows no rashes, ecchymoses or petechia. eferred  Lab Results  Component Value Date   WBC 11.9 (H) 08/15/2016   HGB 10.2 (L) 08/15/2016   HCT 32.1 (L) 08/15/2016   MCV 103 (H) 08/15/2016   PLT 417 (H) 08/15/2016   Lab Results  Component Value Date   FERRITIN 155 07/16/2016   IRON 24 (L) 07/16/2016   TIBC 216 (L) 07/16/2016   UIBC 192 07/16/2016   IRONPCTSAT 11 07/16/2016   Lab Results  Component Value Date   RBC 3.13 (L) 08/15/2016   No results found for: KPAFRELGTCHN, LAMBDASER, KAPLAMBRATIO No results found for: IGGSERUM, IGA, IGMSERUM No results found for: Odetta Pink, SPEI   Chemistry      Component Value Date/Time   NA 140 08/15/2016 1054   NA 136 03/07/2016 1055   K 4.0 08/15/2016 1054   K 4.1 03/07/2016 1055   CL 110 (H) 08/15/2016 1054   CO2 22 08/15/2016 1054   CO2 26 03/07/2016 1055   BUN 40 (H) 08/15/2016 1054   BUN 28.7 (H) 03/07/2016 1055   CREATININE 1.5 (H) 08/15/2016 1054   CREATININE 1.3 (H) 03/07/2016 1055      Component Value Date/Time   CALCIUM 8.9 08/15/2016 1054   CALCIUM 9.5 03/07/2016 1055   ALKPHOS 77 08/15/2016 1054   ALKPHOS 83 03/07/2016 1055   AST 20 08/15/2016 1054   AST 17 03/07/2016 1055   ALT 17 08/15/2016 1054   ALT 9 03/07/2016 1055   BILITOT 0.50 08/15/2016  1054   BILITOT 0.26 03/07/2016 1055     Impression and Plan: Marissa Cruz is a 72 to white female with locally advanced, inoperable, non-small cell lung cancer. She is not a candidate for chemotherapy, radiation therapy or surgery. Thankfully, she has a high PD-L1 expression and is candidate for immunotherapy. So far she has done well and had a nice response noted on her PET scan.   I will get some plain x-rays of  her neck. I cannot imagine this being anything related to cancer. She has horrible arthritis. This probably is the source of the pain.  We will have her back in 3 more weeks.   Volanda Napoleon, MD 6/14/201812:08 PM

## 2016-08-16 ENCOUNTER — Telehealth: Payer: Self-pay

## 2016-08-16 LAB — RETICULOCYTES: RETICULOCYTE COUNT: 1.6 % (ref 0.6–2.6)

## 2016-08-16 NOTE — Telephone Encounter (Addendum)
-----   Message from Volanda Napoleon, MD sent at 08/15/2016  5:28 PM EDT ----- Call her dgtr -- she has BAD arthritis in her neck!!  NO obvious cancer.  She probably needs to see an orthopedic surgeon for suggestions as to how to minimize the pain.  Senaida Ores, daughter notified of above message via phone. Verbalizes understanding and appreciation. dph

## 2016-08-20 ENCOUNTER — Other Ambulatory Visit: Payer: Self-pay | Admitting: Family Medicine

## 2016-08-24 ENCOUNTER — Other Ambulatory Visit: Payer: Self-pay | Admitting: Family Medicine

## 2016-08-28 ENCOUNTER — Other Ambulatory Visit: Payer: Self-pay | Admitting: Family Medicine

## 2016-09-06 ENCOUNTER — Ambulatory Visit: Payer: Medicare Other

## 2016-09-06 ENCOUNTER — Other Ambulatory Visit (HOSPITAL_BASED_OUTPATIENT_CLINIC_OR_DEPARTMENT_OTHER): Payer: Medicare Other

## 2016-09-06 ENCOUNTER — Ambulatory Visit (HOSPITAL_BASED_OUTPATIENT_CLINIC_OR_DEPARTMENT_OTHER): Payer: Medicare Other | Admitting: Family

## 2016-09-06 ENCOUNTER — Ambulatory Visit (HOSPITAL_BASED_OUTPATIENT_CLINIC_OR_DEPARTMENT_OTHER): Payer: Medicare Other

## 2016-09-06 VITALS — BP 151/69 | HR 94 | Temp 98.1°F | Resp 16 | Wt 96.0 lb

## 2016-09-06 DIAGNOSIS — E032 Hypothyroidism due to medicaments and other exogenous substances: Secondary | ICD-10-CM

## 2016-09-06 DIAGNOSIS — C3431 Malignant neoplasm of lower lobe, right bronchus or lung: Secondary | ICD-10-CM

## 2016-09-06 DIAGNOSIS — D500 Iron deficiency anemia secondary to blood loss (chronic): Secondary | ICD-10-CM

## 2016-09-06 DIAGNOSIS — G629 Polyneuropathy, unspecified: Secondary | ICD-10-CM

## 2016-09-06 DIAGNOSIS — M542 Cervicalgia: Secondary | ICD-10-CM

## 2016-09-06 DIAGNOSIS — Z5112 Encounter for antineoplastic immunotherapy: Secondary | ICD-10-CM | POA: Diagnosis not present

## 2016-09-06 DIAGNOSIS — R918 Other nonspecific abnormal finding of lung field: Secondary | ICD-10-CM

## 2016-09-06 LAB — CMP (CANCER CENTER ONLY)
ALBUMIN: 3.1 g/dL — AB (ref 3.3–5.5)
ALT(SGPT): 15 U/L (ref 10–47)
AST: 23 U/L (ref 11–38)
Alkaline Phosphatase: 87 U/L — ABNORMAL HIGH (ref 26–84)
BILIRUBIN TOTAL: 0.4 mg/dL (ref 0.20–1.60)
BUN: 43 mg/dL — AB (ref 7–22)
CO2: 25 meq/L (ref 18–33)
CREATININE: 1.7 mg/dL — AB (ref 0.6–1.2)
Calcium: 9.1 mg/dL (ref 8.0–10.3)
Chloride: 107 mEq/L (ref 98–108)
Glucose, Bld: 94 mg/dL (ref 73–118)
Potassium: 4.5 mEq/L (ref 3.3–4.7)
Sodium: 139 mEq/L (ref 128–145)
TOTAL PROTEIN: 6.5 g/dL (ref 6.4–8.1)

## 2016-09-06 LAB — CBC WITH DIFFERENTIAL (CANCER CENTER ONLY)
BASO#: 0.1 10*3/uL (ref 0.0–0.2)
BASO%: 1 % (ref 0.0–2.0)
EOS%: 6.8 % (ref 0.0–7.0)
Eosinophils Absolute: 0.9 10*3/uL — ABNORMAL HIGH (ref 0.0–0.5)
HCT: 33.7 % — ABNORMAL LOW (ref 34.8–46.6)
HEMOGLOBIN: 10.7 g/dL — AB (ref 11.6–15.9)
LYMPH#: 1.8 10*3/uL (ref 0.9–3.3)
LYMPH%: 13 % — ABNORMAL LOW (ref 14.0–48.0)
MCH: 32.3 pg (ref 26.0–34.0)
MCHC: 31.8 g/dL — ABNORMAL LOW (ref 32.0–36.0)
MCV: 102 fL — ABNORMAL HIGH (ref 81–101)
MONO#: 1.5 10*3/uL — ABNORMAL HIGH (ref 0.1–0.9)
MONO%: 10.8 % (ref 0.0–13.0)
NEUT#: 9.2 10*3/uL — ABNORMAL HIGH (ref 1.5–6.5)
NEUT%: 68.4 % (ref 39.6–80.0)
PLATELETS: 414 10*3/uL — AB (ref 145–400)
RBC: 3.31 10*6/uL — AB (ref 3.70–5.32)
RDW: 13 % (ref 11.1–15.7)
WBC: 13.5 10*3/uL — AB (ref 3.9–10.0)

## 2016-09-06 MED ORDER — SODIUM CHLORIDE 0.9% FLUSH
10.0000 mL | INTRAVENOUS | Status: DC | PRN
  Administered 2016-09-06: 10 mL
  Filled 2016-09-06: qty 10

## 2016-09-06 MED ORDER — HEPARIN SOD (PORK) LOCK FLUSH 100 UNIT/ML IV SOLN
500.0000 [IU] | Freq: Once | INTRAVENOUS | Status: AC | PRN
  Administered 2016-09-06: 500 [IU]
  Filled 2016-09-06: qty 5

## 2016-09-06 MED ORDER — SODIUM CHLORIDE 0.9 % IV SOLN
200.0000 mg | Freq: Once | INTRAVENOUS | Status: AC
  Administered 2016-09-06: 200 mg via INTRAVENOUS
  Filled 2016-09-06: qty 8

## 2016-09-06 MED ORDER — SODIUM CHLORIDE 0.9 % IV SOLN
Freq: Once | INTRAVENOUS | Status: AC
  Administered 2016-09-06: 14:00:00 via INTRAVENOUS

## 2016-09-06 NOTE — Patient Instructions (Signed)
Implanted Port Home Guide An implanted port is a type of central line that is placed under the skin. Central lines are used to provide IV access when treatment or nutrition needs to be given through a person's veins. Implanted ports are used for long-term IV access. An implanted port may be placed because:  You need IV medicine that would be irritating to the small veins in your hands or arms.  You need long-term IV medicines, such as antibiotics.  You need IV nutrition for a long period.  You need frequent blood draws for lab tests.  You need dialysis.  Implanted ports are usually placed in the chest area, but they can also be placed in the upper arm, the abdomen, or the leg. An implanted port has two main parts:  Reservoir. The reservoir is round and will appear as a small, raised area under your skin. The reservoir is the part where a needle is inserted to give medicines or draw blood.  Catheter. The catheter is a thin, flexible tube that extends from the reservoir. The catheter is placed into a large vein. Medicine that is inserted into the reservoir goes into the catheter and then into the vein.  How will I care for my incision site? Do not get the incision site wet. Bathe or shower as directed by your health care provider. How is my port accessed? Special steps must be taken to access the port:  Before the port is accessed, a numbing cream can be placed on the skin. This helps numb the skin over the port site.  Your health care provider uses a sterile technique to access the port. ? Your health care provider must put on a mask and sterile gloves. ? The skin over your port is cleaned carefully with an antiseptic and allowed to dry. ? The port is gently pinched between sterile gloves, and a needle is inserted into the port.  Only "non-coring" port needles should be used to access the port. Once the port is accessed, a blood return should be checked. This helps ensure that the port  is in the vein and is not clogged.  If your port needs to remain accessed for a constant infusion, a clear (transparent) bandage will be placed over the needle site. The bandage and needle will need to be changed every week, or as directed by your health care provider.  Keep the bandage covering the needle clean and dry. Do not get it wet. Follow your health care provider's instructions on how to take a shower or bath while the port is accessed.  If your port does not need to stay accessed, no bandage is needed over the port.  What is flushing? Flushing helps keep the port from getting clogged. Follow your health care provider's instructions on how and when to flush the port. Ports are usually flushed with saline solution or a medicine called heparin. The need for flushing will depend on how the port is used.  If the port is used for intermittent medicines or blood draws, the port will need to be flushed: ? After medicines have been given. ? After blood has been drawn. ? As part of routine maintenance.  If a constant infusion is running, the port may not need to be flushed.  How long will my port stay implanted? The port can stay in for as long as your health care provider thinks it is needed. When it is time for the port to come out, surgery will be   done to remove it. The procedure is similar to the one performed when the port was put in. When should I seek immediate medical care? When you have an implanted port, you should seek immediate medical care if:  You notice a bad smell coming from the incision site.  You have swelling, redness, or drainage at the incision site.  You have more swelling or pain at the port site or the surrounding area.  You have a fever that is not controlled with medicine.  This information is not intended to replace advice given to you by your health care provider. Make sure you discuss any questions you have with your health care provider. Document  Released: 02/18/2005 Document Revised: 07/27/2015 Document Reviewed: 10/26/2012 [employer] Interactive Patient Education  2017 [employer]  

## 2016-09-06 NOTE — Patient Instructions (Signed)
Pembrolizumab injection  What is this medicine?  PEMBROLIZUMAB (pem broe liz ue mab) is a monoclonal antibody. It is used to treat melanoma, head and neck cancer, Hodgkin lymphoma, non-small cell lung cancer, urothelial cancer, stomach cancer, and cancers that have a certain genetic condition.  This medicine may be used for other purposes; ask your health care provider or pharmacist if you have questions.  COMMON BRAND NAME(S): Keytruda  What should I tell my health care provider before I take this medicine?  They need to know if you have any of these conditions:  -diabetes  -immune system problems  -inflammatory bowel disease  -liver disease  -lung or breathing disease  -lupus  -organ transplant  -an unusual or allergic reaction to pembrolizumab, other medicines, foods, dyes, or preservatives  -pregnant or trying to get pregnant  -breast-feeding  How should I use this medicine?  This medicine is for infusion into a vein. It is given by a health care professional in a hospital or clinic setting.  A special MedGuide will be given to you before each treatment. Be sure to read this information carefully each time.  Talk to your pediatrician regarding the use of this medicine in children. While this drug may be prescribed for selected conditions, precautions do apply.  Overdosage: If you think you have taken too much of this medicine contact a poison control center or emergency room at once.  NOTE: This medicine is only for you. Do not share this medicine with others.  What if I miss a dose?  It is important not to miss your dose. Call your doctor or health care professional if you are unable to keep an appointment.  What may interact with this medicine?  Interactions have not been studied.  Give your health care provider a list of all the medicines, herbs, non-prescription drugs, or dietary supplements you use. Also tell them if you smoke, drink alcohol, or use illegal drugs. Some items may interact with your  medicine.  This list may not describe all possible interactions. Give your health care provider a list of all the medicines, herbs, non-prescription drugs, or dietary supplements you use. Also tell them if you smoke, drink alcohol, or use illegal drugs. Some items may interact with your medicine.  What should I watch for while using this medicine?  Your condition will be monitored carefully while you are receiving this medicine.  You may need blood work done while you are taking this medicine.  Do not become pregnant while taking this medicine or for 4 months after stopping it. Women should inform their doctor if they wish to become pregnant or think they might be pregnant. There is a potential for serious side effects to an unborn child. Talk to your health care professional or pharmacist for more information. Do not breast-feed an infant while taking this medicine or for 4 months after the last dose.  What side effects may I notice from receiving this medicine?  Side effects that you should report to your doctor or health care professional as soon as possible:  -allergic reactions like skin rash, itching or hives, swelling of the face, lips, or tongue  -bloody or black, tarry  -breathing problems  -changes in vision  -chest pain  -chills  -constipation  -cough  -dizziness or feeling faint or lightheaded  -fast or irregular heartbeat  -fever  -flushing  -hair loss  -low blood counts - this medicine may decrease the number of white blood cells, red blood cells   and platelets. You may be at increased risk for infections and bleeding.  -muscle pain  -muscle weakness  -persistent headache  -signs and symptoms of high blood sugar such as dizziness; dry mouth; dry skin; fruity breath; nausea; stomach pain; increased hunger or thirst; increased urination  -signs and symptoms of kidney injury like trouble passing urine or change in the amount of urine  -signs and symptoms of liver injury like dark urine, light-colored  stools, loss of appetite, nausea, right upper belly pain, yellowing of the eyes or skin  -stomach pain  -sweating  -weight loss  Side effects that usually do not require medical attention (report to your doctor or health care professional if they continue or are bothersome):  -decreased appetite  -diarrhea  -tiredness  This list may not describe all possible side effects. Call your doctor for medical advice about side effects. You may report side effects to FDA at 1-800-FDA-1088.  Where should I keep my medicine?  This drug is given in a hospital or clinic and will not be stored at home.  NOTE: This sheet is a summary. It may not cover all possible information. If you have questions about this medicine, talk to your doctor, pharmacist, or health care provider.   2018 Elsevier/Gold Standard (2015-11-28 12:29:36)

## 2016-09-06 NOTE — Progress Notes (Signed)
Hematology and Oncology Follow Up Visit  Marissa Cruz [id number] 08-Jun-1934 82 y.o. 09/06/2016   Principle Diagnosis:  Stage IIIB ([id number]) poorly differentiated squamous cell carcinoma - unresectable -- HIGH PD-L1 (95%) Iron deficiency anemia - malabsorption  Current Therapy:   Keytruda every 3 week dosing - s/p cycle 8 IV feraheme - last received in May 2018   Interim History:  Marissa Cruz is here today with her daughter for follow-up and treatment. She is having some fatigued. This is unchanged from her baseline.  She has had some sinus congestions and drainage that comes and goes. No c/o fever, chills, n/v, cough, rash, dizziness, SOB, chest pain, palpitations, abdominal pain or changes in her bowel or bladder habits.  She has had no episodes of bleeding, bruising or petechiae. No lymphadenopathy found on exam.  The neuropathy in her feet is unchanged. She has had no swelling or tenderness in her extremities. She has arthritic pain in her neck and takes one Aleve every morning which helps resolve her discomfort. No c/o pain at this time.  Her appetite comes and goes. She is trying her best to stay hydrated. Her weight is up 2 lbs since her last visit.   ECOG Performance Status: 2 - Symptomatic, <50% confined to bed  Medications:  Allergies as of 09/06/2016      Reactions   Morphine And Related Other (See Comments)   Hallucinations and combative   Codeine Nausea Only      Medication List       Accurate as of 09/06/16  1:34 PM. Always use your most recent med list.          allopurinol 100 MG tablet Commonly known as:  ZYLOPRIM TAKE 1 TABLET BY MOUTH AT BEDTIME   ALPRAZolam 0.25 MG tablet Commonly known as:  XANAX TAKE 1 TABLET BY MOUTH AS NEEDED FOR ANXIETY   amLODipine 5 MG tablet Commonly known as:  NORVASC take 40m by mouth daily   amLODipine 5 MG tablet Commonly known as:  NORVASC TAKE 1 TABLET BY MOUTH DAILY FOR HIGH BLOOD PRESSURE   aspirin 81 MG tablet Take  81 mg by mouth daily.   cetirizine 10 MG tablet Commonly known as:  ZYRTEC Take 10 mg by mouth at bedtime.   docusate sodium 100 MG capsule Commonly known as:  COLACE Take 100 mg by mouth 2 (two) times daily.   estrogens (conjugated) 0.625 MG tablet Commonly known as:  [name] Take 1 tablet by mouth every day for 21 days, then do not take for 7 days.   ferrous sulfate 325 (65 FE) MG EC tablet Take 325 mg by mouth 2 (two) times daily.   HM MULTIVITAMIN ADULT GUMMY Chew Chew 2 tablets by mouth every morning.   lidocaine-prilocaine cream Commonly known as:  EMLA Apply to affected area once   LORazepam 0.5 MG tablet Commonly known as:  ATIVAN Take 1 tablet (0.5 mg total) by mouth every 6 (six) hours as needed (Nausea or vomiting).   megestrol 40 MG/ML suspension Commonly known as:  MEGACE Take 10 mLs (400 mg total) by mouth daily.   metoprolol succinate 25 MG 24 hr tablet Commonly known as:  TOPROL-XL TAKE 1 TABLET(25 MG) BY MOUTH EVERY MORNING   mirtazapine 15 MG disintegrating tablet Commonly known as:  REMERON SOL-TAB DISSOLVE 1 TABLET ON THE TONGUE AT BEDTIME   nicotine 14 mg/24hr patch Commonly known as:  NICODERM CQ - dosed in mg/24 hours Place 14 mg onto the skin daily.  omeprazole 20 MG capsule Commonly known as:  PRILOSEC TAKE 1 CAPSULE BY MOUTH TWICE DAILY BEFORE A MEAL   ondansetron 8 MG disintegrating tablet Commonly known as:  ZOFRAN-ODT Take 1 tablet (8 mg total) by mouth every 8 (eight) hours as needed for nausea or vomiting. Reported on 05/17/2015   PARoxetine 10 MG tablet Commonly known as:  PAXIL TAKE 1 TABLET BY MOUTH DAILY FOR DEPRESSION   traZODone 50 MG tablet Commonly known as:  DESYREL TAKE 1 TABLET(50 MG) BY MOUTH AT BEDTIME       Allergies:  Allergies  Allergen Reactions  . Morphine And Related Other (See Comments)    Hallucinations and combative  . Codeine Nausea Only    Past Medical History, Surgical history, Social  history, and Family History were reviewed and updated.  Review of Systems: All other 10 point review of systems is negative.   Physical Exam:  weight is 96 lb (43.5 kg). Her oral temperature is 98.1 F (36.7 C). Her blood pressure is 151/69 (abnormal) and her pulse is 94. Her respiration is 16 and oxygen saturation is 100%.   Wt Readings from Last 3 Encounters:  09/06/16 96 lb (43.5 kg)  08/15/16 94 lb 8 oz (42.9 kg)  07/26/16 95 lb (43.1 kg)    Ocular: Sclerae unicteric, pupils equal, round and reactive to light Ear-nose-throat: Oropharynx clear, dentition fair Lymphatic: No cervical, supraclavicular or axillary adenopathy Lungs no rales or rhonchi, good excursion bilaterally Heart regular rate and rhythm, no murmur appreciated Abd soft, nontender, positive bowel sounds, no liver or spleen tip palpated on exam, no fluid wave MSK no focal spinal tenderness, no joint edema Neuro: non-focal, well-oriented, appropriate affect Breasts: Deferred   Lab Results  Component Value Date   WBC 13.5 (H) 09/06/2016   HGB 10.7 (L) 09/06/2016   HCT 33.7 (L) 09/06/2016   MCV 102 (H) 09/06/2016   PLT 414 (H) 09/06/2016   Lab Results  Component Value Date   FERRITIN 366 (H) 08/15/2016   IRON 74 08/15/2016   TIBC 246 08/15/2016   UIBC 172 08/15/2016   IRONPCTSAT 30 08/15/2016   Lab Results  Component Value Date   RBC 3.31 (L) 09/06/2016   No results found for: KPAFRELGTCHN, LAMBDASER, KAPLAMBRATIO No results found for: IGGSERUM, IGA, IGMSERUM No results found for: Odetta Pink, SPEI   Chemistry      Component Value Date/Time   NA 139 09/06/2016 1153   NA 136 03/07/2016 1055   K 4.5 09/06/2016 1153   K 4.1 03/07/2016 1055   CL 107 09/06/2016 1153   CO2 25 09/06/2016 1153   CO2 26 03/07/2016 1055   BUN 43 (H) 09/06/2016 1153   BUN 28.7 (H) 03/07/2016 1055   CREATININE 1.7 (H) 09/06/2016 1153   CREATININE 1.3 (H) 03/07/2016  1055      Component Value Date/Time   CALCIUM 9.1 09/06/2016 1153   CALCIUM 9.5 03/07/2016 1055   ALKPHOS 87 (H) 09/06/2016 1153   ALKPHOS 83 03/07/2016 1055   AST 23 09/06/2016 1153   AST 17 03/07/2016 1055   ALT 15 09/06/2016 1153   ALT 9 03/07/2016 1055   BILITOT 0.40 09/06/2016 1153   BILITOT 0.26 03/07/2016 1055      Impression and Plan: Marissa Cruz is a very pleasant 55 yp caucasian female with locally advanced, inoperable, non-small cell lung cancer. She was not a candidate for chemotherapy, radiation or surgery. She had a high PD-L1 expression  and was able to start immunotherapy. She has tolerated this nicely so far and has no complaints at this time.  We will proceed with cycle 9 today as planned per Dr. Marin Olp.  We will repeat another PET scan after cycle 10 to reassess her response. She has her current treatment and appointment schedule. We will plan to see her back in another 3 weeks on 7/26 for repeat lab work, MD follow-up and treatment.  Greater than 50% of her 15 minute face to face visit was spent counseling and coordinating care.  Both she and her sweet family know to contact our office with any questions or concerns. We can certainly see her sooner if need be.   Eliezer Bottom, NP 7/6/20181:34 PM

## 2016-09-08 ENCOUNTER — Other Ambulatory Visit: Payer: Self-pay | Admitting: Family Medicine

## 2016-09-09 LAB — IRON AND TIBC
%SAT: 25 % (ref 21–57)
Iron: 61 ug/dL (ref 41–142)
TIBC: 243 ug/dL (ref 236–444)
UIBC: 182 ug/dL (ref 120–384)

## 2016-09-09 LAB — FERRITIN: Ferritin: 292 ng/ml — ABNORMAL HIGH (ref 9–269)

## 2016-09-09 LAB — TSH: TSH: 3.535 m[IU]/L (ref 0.308–3.960)

## 2016-09-12 ENCOUNTER — Other Ambulatory Visit: Payer: Self-pay | Admitting: Family Medicine

## 2016-09-12 NOTE — Telephone Encounter (Signed)
Last refill on 06/18/16 Last office visit on 07/23/2016

## 2016-09-18 ENCOUNTER — Other Ambulatory Visit: Payer: Self-pay | Admitting: Family Medicine

## 2016-09-23 ENCOUNTER — Other Ambulatory Visit: Payer: Self-pay | Admitting: Family Medicine

## 2016-09-25 ENCOUNTER — Other Ambulatory Visit: Payer: Self-pay | Admitting: *Deleted

## 2016-09-25 ENCOUNTER — Other Ambulatory Visit: Payer: Self-pay | Admitting: Family

## 2016-09-25 ENCOUNTER — Other Ambulatory Visit: Payer: Self-pay | Admitting: Hematology & Oncology

## 2016-09-25 DIAGNOSIS — C3431 Malignant neoplasm of lower lobe, right bronchus or lung: Secondary | ICD-10-CM

## 2016-09-26 ENCOUNTER — Other Ambulatory Visit (HOSPITAL_BASED_OUTPATIENT_CLINIC_OR_DEPARTMENT_OTHER): Payer: Medicare Other

## 2016-09-26 ENCOUNTER — Other Ambulatory Visit: Payer: Self-pay

## 2016-09-26 ENCOUNTER — Ambulatory Visit (HOSPITAL_BASED_OUTPATIENT_CLINIC_OR_DEPARTMENT_OTHER): Payer: Medicare Other

## 2016-09-26 ENCOUNTER — Ambulatory Visit: Payer: Medicare Other

## 2016-09-26 ENCOUNTER — Ambulatory Visit (HOSPITAL_BASED_OUTPATIENT_CLINIC_OR_DEPARTMENT_OTHER): Payer: Medicare Other | Admitting: Hematology & Oncology

## 2016-09-26 VITALS — BP 164/69 | HR 96 | Temp 97.5°F | Resp 16 | Wt 96.4 lb

## 2016-09-26 DIAGNOSIS — R918 Other nonspecific abnormal finding of lung field: Secondary | ICD-10-CM

## 2016-09-26 DIAGNOSIS — R5383 Other fatigue: Secondary | ICD-10-CM

## 2016-09-26 DIAGNOSIS — G629 Polyneuropathy, unspecified: Secondary | ICD-10-CM

## 2016-09-26 DIAGNOSIS — Z5112 Encounter for antineoplastic immunotherapy: Secondary | ICD-10-CM | POA: Diagnosis not present

## 2016-09-26 DIAGNOSIS — F411 Generalized anxiety disorder: Secondary | ICD-10-CM

## 2016-09-26 DIAGNOSIS — N184 Chronic kidney disease, stage 4 (severe): Secondary | ICD-10-CM

## 2016-09-26 DIAGNOSIS — C3431 Malignant neoplasm of lower lobe, right bronchus or lung: Secondary | ICD-10-CM | POA: Diagnosis not present

## 2016-09-26 DIAGNOSIS — M542 Cervicalgia: Secondary | ICD-10-CM | POA: Diagnosis not present

## 2016-09-26 DIAGNOSIS — D500 Iron deficiency anemia secondary to blood loss (chronic): Secondary | ICD-10-CM

## 2016-09-26 LAB — CBC WITH DIFFERENTIAL (CANCER CENTER ONLY)
BASO#: 0.1 10*3/uL (ref 0.0–0.2)
BASO%: 1 % (ref 0.0–2.0)
EOS ABS: 0.7 10*3/uL — AB (ref 0.0–0.5)
EOS%: 6.7 % (ref 0.0–7.0)
HCT: 32.8 % — ABNORMAL LOW (ref 34.8–46.6)
HEMOGLOBIN: 10.3 g/dL — AB (ref 11.6–15.9)
LYMPH#: 1.3 10*3/uL (ref 0.9–3.3)
LYMPH%: 11.8 % — AB (ref 14.0–48.0)
MCH: 31.9 pg (ref 26.0–34.0)
MCHC: 31.4 g/dL — AB (ref 32.0–36.0)
MCV: 102 fL — ABNORMAL HIGH (ref 81–101)
MONO#: 1.4 10*3/uL — ABNORMAL HIGH (ref 0.1–0.9)
MONO%: 13.1 % — AB (ref 0.0–13.0)
NEUT#: 7.2 10*3/uL — ABNORMAL HIGH (ref 1.5–6.5)
NEUT%: 67.4 % (ref 39.6–80.0)
PLATELETS: 439 10*3/uL — AB (ref 145–400)
RBC: 3.23 10*6/uL — ABNORMAL LOW (ref 3.70–5.32)
RDW: 12.7 % (ref 11.1–15.7)
WBC: 10.7 10*3/uL — ABNORMAL HIGH (ref 3.9–10.0)

## 2016-09-26 LAB — CMP (CANCER CENTER ONLY)
ALBUMIN: 2.9 g/dL — AB (ref 3.3–5.5)
ALT(SGPT): 16 U/L (ref 10–47)
AST: 21 U/L (ref 11–38)
Alkaline Phosphatase: 77 U/L (ref 26–84)
BUN, Bld: 38 mg/dL — ABNORMAL HIGH (ref 7–22)
CHLORIDE: 110 meq/L — AB (ref 98–108)
CO2: 26 meq/L (ref 18–33)
CREATININE: 1.5 mg/dL — AB (ref 0.6–1.2)
Calcium: 9.4 mg/dL (ref 8.0–10.3)
Glucose, Bld: 95 mg/dL (ref 73–118)
Potassium: 4.3 mEq/L (ref 3.3–4.7)
SODIUM: 141 meq/L (ref 128–145)
Total Bilirubin: 0.4 mg/dl (ref 0.20–1.60)
Total Protein: 6.2 g/dL — ABNORMAL LOW (ref 6.4–8.1)

## 2016-09-26 MED ORDER — HEPARIN SOD (PORK) LOCK FLUSH 100 UNIT/ML IV SOLN
500.0000 [IU] | Freq: Once | INTRAVENOUS | Status: AC | PRN
  Administered 2016-09-26: 500 [IU]
  Filled 2016-09-26: qty 5

## 2016-09-26 MED ORDER — ALPRAZOLAM 0.25 MG PO TABS: 0.2500 mg | ORAL_TABLET | Freq: Three times a day (TID) | ORAL | 0 refills | Status: DC | PRN

## 2016-09-26 MED ORDER — SODIUM CHLORIDE 0.9 % IV SOLN
Freq: Once | INTRAVENOUS | Status: AC
  Administered 2016-09-26: 13:00:00 via INTRAVENOUS

## 2016-09-26 MED ORDER — SODIUM CHLORIDE 0.9 % IV SOLN
200.0000 mg | Freq: Once | INTRAVENOUS | Status: AC
  Administered 2016-09-26: 200 mg via INTRAVENOUS
  Filled 2016-09-26: qty 8

## 2016-09-26 MED ORDER — SODIUM CHLORIDE 0.9% FLUSH
10.0000 mL | INTRAVENOUS | Status: DC | PRN
  Administered 2016-09-26: 10 mL
  Filled 2016-09-26: qty 10

## 2016-09-26 MED ORDER — TRAMADOL HCL 50 MG PO TABS: 50.0000 mg | ORAL_TABLET | Freq: Four times a day (QID) | ORAL | 0 refills | Status: AC | PRN

## 2016-09-26 NOTE — Patient Instructions (Signed)
Pembrolizumab injection  What is this medicine?  PEMBROLIZUMAB (pem broe liz ue mab) is a monoclonal antibody. It is used to treat melanoma, head and neck cancer, Hodgkin lymphoma, non-small cell lung cancer, urothelial cancer, stomach cancer, and cancers that have a certain genetic condition.  This medicine may be used for other purposes; ask your health care provider or pharmacist if you have questions.  COMMON BRAND NAME(S): Keytruda  What should I tell my health care provider before I take this medicine?  They need to know if you have any of these conditions:  -diabetes  -immune system problems  -inflammatory bowel disease  -liver disease  -lung or breathing disease  -lupus  -organ transplant  -an unusual or allergic reaction to pembrolizumab, other medicines, foods, dyes, or preservatives  -pregnant or trying to get pregnant  -breast-feeding  How should I use this medicine?  This medicine is for infusion into a vein. It is given by a health care professional in a hospital or clinic setting.  A special MedGuide will be given to you before each treatment. Be sure to read this information carefully each time.  Talk to your pediatrician regarding the use of this medicine in children. While this drug may be prescribed for selected conditions, precautions do apply.  Overdosage: If you think you have taken too much of this medicine contact a poison control center or emergency room at once.  NOTE: This medicine is only for you. Do not share this medicine with others.  What if I miss a dose?  It is important not to miss your dose. Call your doctor or health care professional if you are unable to keep an appointment.  What may interact with this medicine?  Interactions have not been studied.  Give your health care provider a list of all the medicines, herbs, non-prescription drugs, or dietary supplements you use. Also tell them if you smoke, drink alcohol, or use illegal drugs. Some items may interact with your  medicine.  This list may not describe all possible interactions. Give your health care provider a list of all the medicines, herbs, non-prescription drugs, or dietary supplements you use. Also tell them if you smoke, drink alcohol, or use illegal drugs. Some items may interact with your medicine.  What should I watch for while using this medicine?  Your condition will be monitored carefully while you are receiving this medicine.  You may need blood work done while you are taking this medicine.  Do not become pregnant while taking this medicine or for 4 months after stopping it. Women should inform their doctor if they wish to become pregnant or think they might be pregnant. There is a potential for serious side effects to an unborn child. Talk to your health care professional or pharmacist for more information. Do not breast-feed an infant while taking this medicine or for 4 months after the last dose.  What side effects may I notice from receiving this medicine?  Side effects that you should report to your doctor or health care professional as soon as possible:  -allergic reactions like skin rash, itching or hives, swelling of the face, lips, or tongue  -bloody or black, tarry  -breathing problems  -changes in vision  -chest pain  -chills  -constipation  -cough  -dizziness or feeling faint or lightheaded  -fast or irregular heartbeat  -fever  -flushing  -hair loss  -low blood counts - this medicine may decrease the number of white blood cells, red blood cells   and platelets. You may be at increased risk for infections and bleeding.  -muscle pain  -muscle weakness  -persistent headache  -signs and symptoms of high blood sugar such as dizziness; dry mouth; dry skin; fruity breath; nausea; stomach pain; increased hunger or thirst; increased urination  -signs and symptoms of kidney injury like trouble passing urine or change in the amount of urine  -signs and symptoms of liver injury like dark urine, light-colored  stools, loss of appetite, nausea, right upper belly pain, yellowing of the eyes or skin  -stomach pain  -sweating  -weight loss  Side effects that usually do not require medical attention (report to your doctor or health care professional if they continue or are bothersome):  -decreased appetite  -diarrhea  -tiredness  This list may not describe all possible side effects. Call your doctor for medical advice about side effects. You may report side effects to FDA at 1-800-FDA-1088.  Where should I keep my medicine?  This drug is given in a hospital or clinic and will not be stored at home.  NOTE: This sheet is a summary. It may not cover all possible information. If you have questions about this medicine, talk to your doctor, pharmacist, or health care provider.   2018 Elsevier/Gold Standard (2015-11-28 12:29:36)

## 2016-09-26 NOTE — Progress Notes (Signed)
Hematology and Oncology Follow Up Visit  Marissa Cruz [id number] 08-04-1934 82 y.o. 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 She is having some fatigued. This is unchanged from her baseline.   She has had some sinus congestions and drainage that comes and goes. No c/o fever, chills, n/v, cough, rash, dizziness, SOB, chest pain, palpitations, abdominal pain or changes in her bowel or bladder habits.   She has had no episodes of bleeding, bruising or petechiae. No lymphadenopathy found on exam.   The neuropathy in her feet is unchanged. She has had no swelling or tenderness in her extremities. She has arthritic pain in her neck and takes one Aleve every morning which helps resolve her discomfort. No c/o pain at this time.   She's having more neck pain. I think this is arthritic issues. She takes Aleve for this. I told her that she may need to see a orthopedist for recommendations with respect to any type of interventions for relief.  Her appetite comes and goes. She is trying her best to stay hydrated.   Her weight is holding steady.  Overall, her performance status is ECOG 2   Medications:  Allergies as of 09/26/2016      Reactions   Morphine And Related Other (See Comments)   Hallucinations and combative   Codeine Nausea Only      Medication List       Accurate as of 09/26/16 12:35 PM. Always use your most recent med list.          allopurinol 100 MG tablet Commonly known as:  ZYLOPRIM TAKE 1 TABLET BY MOUTH AT BEDTIME   ALPRAZolam 0.25 MG tablet Commonly known as:  XANAX TAKE 1 TABLET BY MOUTH AS NEEDED FOR ANXIETY   amLODipine 5 MG  tablet Commonly known as:  NORVASC TAKE 1 TABLET BY MOUTH DAILY FOR HIGH BLOOD PRESSURE   aspirin 81 MG tablet Take 81 mg by mouth daily.   cetirizine 10 MG tablet Commonly known as:  ZYRTEC Take 10 mg by mouth at bedtime.   docusate sodium 100 MG capsule Commonly known as:  COLACE Take 100 mg by mouth 2 (two) times daily.   estrogens (conjugated) 0.625 MG tablet Commonly known as:  [name] Take 1 tablet by mouth every day for 21 days, then do not take for 7 days.   ferrous sulfate 325 (65 FE) MG EC tablet Take 325 mg by mouth 2 (two) times daily.   HM MULTIVITAMIN ADULT GUMMY Chew Chew 2 tablets by mouth every morning.   lidocaine-prilocaine cream Commonly known as:  EMLA Apply to affected area once   LORazepam 0.5 MG tablet Commonly known as:  ATIVAN Take 1 tablet (0.5 mg total) by mouth every 6 (six) hours as needed (Nausea or vomiting).   megestrol 40 MG/ML suspension Commonly known as:  MEGACE TAKE 10 ML BY MOUTH DAILY   metoprolol succinate 25 MG 24 hr tablet Commonly known as:  TOPROL-XL TAKE 1 TABLET(25 MG) BY MOUTH EVERY MORNING   mirtazapine 15 MG disintegrating tablet Commonly known as:  REMERON SOL-TAB DISSOLVE 1 TABLET ON THE TONGUE AT BEDTIME   nicotine 14 mg/24hr patch Commonly  known as:  NICODERM CQ - dosed in mg/24 hours Place 14 mg onto the skin daily.   omeprazole 20 MG capsule Commonly known as:  PRILOSEC TAKE 1 CAPSULE BY MOUTH TWICE DAILY BEFORE A MEAL   ondansetron 8 MG disintegrating tablet Commonly known as:  ZOFRAN-ODT Take 1 tablet (8 mg total) by mouth every 8 (eight) hours as needed for nausea or vomiting. Reported on 05/17/2015   PARoxetine 10 MG tablet Commonly known as:  PAXIL TAKE 1 TABLET BY MOUTH DAILY FOR DEPRESSION   traZODone 50 MG tablet Commonly known as:  DESYREL TAKE 1 TABLET(50 MG) BY MOUTH AT BEDTIME       Allergies:  Allergies  Allergen Reactions  . Morphine And Related Other (See Comments)     Hallucinations and combative  . Codeine Nausea Only    Past Medical History, Surgical history, Social history, and Family History were reviewed and updated.  Review of Systems: All other 10 point review of systems is negative.   Physical Exam:  weight is 96 lb 6.4 oz (43.7 kg). Her oral temperature is 97.5 F (36.4 C) (abnormal). Her blood pressure is 164/69 (abnormal) and her pulse is 96. Her respiration is 16 and oxygen saturation is 98%.   Wt Readings from Last 3 Encounters:  09/26/16 96 lb 6.4 oz (43.7 kg)  09/06/16 96 lb (43.5 kg)  08/15/16 94 lb 8 oz (42.9 kg)   Head and neck exam shows no ocular or oral lesions. She has no palpable cervical or supraclavicular lymph nodes. Lungs are clear. There may be some slight decrease over on the right side. Cardiac exam regular rate and rhythm with no murmurs, rubs or bruits. Abdomen is soft. She has good bowel sounds. There is no fluid wave. There is no palpable liver or spleen tip. Back exam shows no tenderness over the spine, ribs or hips. Externally shows no clubbing, cyanosis or edema. Neurological exam shows no focal neurological deficits. Skin exam shows no rashes, ecchymoses or petechia. The   Lab Results  Component Value Date   WBC 10.7 (H) 09/26/2016   HGB 10.3 (L) 09/26/2016   HCT 32.8 (L) 09/26/2016   MCV 102 (H) 09/26/2016   PLT 439 (H) 09/26/2016   Lab Results  Component Value Date   FERRITIN 292 (H) 09/06/2016   IRON 61 09/06/2016   TIBC 243 09/06/2016   UIBC 182 09/06/2016   IRONPCTSAT 25 09/06/2016   Lab Results  Component Value Date   RBC 3.23 (L) 09/26/2016   No results found for: KPAFRELGTCHN, LAMBDASER, KAPLAMBRATIO No results found for: IGGSERUM, IGA, IGMSERUM No results found for: Odetta Pink, SPEI   Chemistry      Component Value Date/Time   NA 139 09/06/2016 1153   NA 136 03/07/2016 1055   K 4.5 09/06/2016 1153   K 4.1 03/07/2016 1055   CL 107  09/06/2016 1153   CO2 25 09/06/2016 1153   CO2 26 03/07/2016 1055   BUN 43 (H) 09/06/2016 1153   BUN 28.7 (H) 03/07/2016 1055   CREATININE 1.7 (H) 09/06/2016 1153   CREATININE 1.3 (H) 03/07/2016 1055      Component Value Date/Time   CALCIUM 9.1 09/06/2016 1153   CALCIUM 9.5 03/07/2016 1055   ALKPHOS 87 (H) 09/06/2016 1153   ALKPHOS 83 03/07/2016 1055   AST 23 09/06/2016 1153   AST 17 03/07/2016 1055   ALT 15 09/06/2016 1153   ALT 9 03/07/2016 1055  BILITOT 0.40 09/06/2016 1153   BILITOT 0.26 03/07/2016 1055      Impression and Plan: Marissa Cruz is a very pleasant 70 yp caucasian female with locally advanced, inoperable, non-small cell lung cancer.   She was not a candidate for chemotherapy, radiation or surgery. She had a high PD-L1 expression and was able to start immunotherapy.   We will proceed with cycle 10 today as planned .  We will repeat another PET scan after cycle 10 to reassess her response.  I will give her next week off. I think this will help her out.    Volanda Napoleon, MD 7/26/201812:35 PM

## 2016-10-10 ENCOUNTER — Ambulatory Visit (HOSPITAL_COMMUNITY)
Admission: RE | Admit: 2016-10-10 | Discharge: 2016-10-10 | Disposition: A | Discharge status: Home / Self Care | Payer: Medicare Other | Source: Ambulatory Visit | Attending: Family | Admitting: Family

## 2016-10-10 DIAGNOSIS — N184 Chronic kidney disease, stage 4 (severe): Secondary | ICD-10-CM | POA: Diagnosis not present

## 2016-10-10 DIAGNOSIS — C3431 Malignant neoplasm of lower lobe, right bronchus or lung: Secondary | ICD-10-CM | POA: Insufficient documentation

## 2016-10-10 DIAGNOSIS — I700 Atherosclerosis of aorta: Secondary | ICD-10-CM | POA: Insufficient documentation

## 2016-10-10 DIAGNOSIS — D500 Iron deficiency anemia secondary to blood loss (chronic): Secondary | ICD-10-CM

## 2016-10-10 DIAGNOSIS — K8020 Calculus of gallbladder without cholecystitis without obstruction: Secondary | ICD-10-CM | POA: Diagnosis not present

## 2016-10-10 LAB — GLUCOSE, CAPILLARY: Glucose-Capillary: 72 mg/dL (ref 65–99)

## 2016-10-10 MED ORDER — FLUDEOXYGLUCOSE F - 18 (FDG) INJECTION
5.0200 | Freq: Once | INTRAVENOUS | Status: AC | PRN
  Administered 2016-10-10: 5.02 via INTRAVENOUS

## 2016-10-11 ENCOUNTER — Telehealth: Payer: Self-pay | Admitting: *Deleted

## 2016-10-11 NOTE — Telephone Encounter (Addendum)
Patient aware of results  ----- Message from Volanda Napoleon, MD sent at 10/10/2016  4:32 PM EDT ----- Call - overall the tumor is still responding!!!  Great job!!  pete

## 2016-10-12 ENCOUNTER — Other Ambulatory Visit: Payer: Self-pay | Admitting: Family Medicine

## 2016-10-14 NOTE — Telephone Encounter (Signed)
Rx sent to pharmacy. LB 

## 2016-10-17 ENCOUNTER — Ambulatory Visit: Payer: Medicare Other | Admitting: Hematology & Oncology

## 2016-10-17 ENCOUNTER — Other Ambulatory Visit: Payer: Medicare Other

## 2016-10-17 ENCOUNTER — Ambulatory Visit: Payer: Medicare Other

## 2016-10-21 ENCOUNTER — Emergency Department (HOSPITAL_BASED_OUTPATIENT_CLINIC_OR_DEPARTMENT_OTHER)
Admission: EM | Admit: 2016-10-21 | Discharge: 2016-10-21 | Disposition: A | Discharge status: Home / Self Care | Payer: Medicare Other | Attending: Emergency Medicine | Admitting: Emergency Medicine

## 2016-10-21 ENCOUNTER — Emergency Department (HOSPITAL_BASED_OUTPATIENT_CLINIC_OR_DEPARTMENT_OTHER): Payer: Medicare Other

## 2016-10-21 ENCOUNTER — Encounter (HOSPITAL_BASED_OUTPATIENT_CLINIC_OR_DEPARTMENT_OTHER): Payer: Self-pay | Admitting: *Deleted

## 2016-10-21 DIAGNOSIS — N184 Chronic kidney disease, stage 4 (severe): Secondary | ICD-10-CM | POA: Diagnosis not present

## 2016-10-21 DIAGNOSIS — Y999 Unspecified external cause status: Secondary | ICD-10-CM | POA: Insufficient documentation

## 2016-10-21 DIAGNOSIS — S99921A Unspecified injury of right foot, initial encounter: Secondary | ICD-10-CM | POA: Diagnosis present

## 2016-10-21 DIAGNOSIS — R7989 Other specified abnormal findings of blood chemistry: Secondary | ICD-10-CM | POA: Insufficient documentation

## 2016-10-21 DIAGNOSIS — W19XXXA Unspecified fall, initial encounter: Secondary | ICD-10-CM

## 2016-10-21 DIAGNOSIS — Y939 Activity, unspecified: Secondary | ICD-10-CM | POA: Diagnosis not present

## 2016-10-21 DIAGNOSIS — S92341A Displaced fracture of fourth metatarsal bone, right foot, initial encounter for closed fracture: Secondary | ICD-10-CM | POA: Diagnosis not present

## 2016-10-21 DIAGNOSIS — N390 Urinary tract infection, site not specified: Secondary | ICD-10-CM | POA: Diagnosis not present

## 2016-10-21 DIAGNOSIS — D010 Carcinoma in situ of colon: Secondary | ICD-10-CM | POA: Diagnosis not present

## 2016-10-21 DIAGNOSIS — S92909A Unspecified fracture of unspecified foot, initial encounter for closed fracture: Secondary | ICD-10-CM

## 2016-10-21 DIAGNOSIS — S92324A Nondisplaced fracture of second metatarsal bone, right foot, initial encounter for closed fracture: Secondary | ICD-10-CM | POA: Diagnosis not present

## 2016-10-21 DIAGNOSIS — W1789XA Other fall from one level to another, initial encounter: Secondary | ICD-10-CM | POA: Insufficient documentation

## 2016-10-21 DIAGNOSIS — Z79899 Other long term (current) drug therapy: Secondary | ICD-10-CM | POA: Insufficient documentation

## 2016-10-21 DIAGNOSIS — S299XXA Unspecified injury of thorax, initial encounter: Secondary | ICD-10-CM | POA: Diagnosis not present

## 2016-10-21 DIAGNOSIS — S99911A Unspecified injury of right ankle, initial encounter: Secondary | ICD-10-CM | POA: Diagnosis not present

## 2016-10-21 DIAGNOSIS — C3492 Malignant neoplasm of unspecified part of left bronchus or lung: Secondary | ICD-10-CM | POA: Diagnosis not present

## 2016-10-21 DIAGNOSIS — Z7982 Long term (current) use of aspirin: Secondary | ICD-10-CM | POA: Diagnosis not present

## 2016-10-21 DIAGNOSIS — S92901A Unspecified fracture of right foot, initial encounter for closed fracture: Secondary | ICD-10-CM | POA: Diagnosis not present

## 2016-10-21 DIAGNOSIS — F17210 Nicotine dependence, cigarettes, uncomplicated: Secondary | ICD-10-CM | POA: Insufficient documentation

## 2016-10-21 DIAGNOSIS — S92331A Displaced fracture of third metatarsal bone, right foot, initial encounter for closed fracture: Secondary | ICD-10-CM | POA: Diagnosis not present

## 2016-10-21 DIAGNOSIS — I129 Hypertensive chronic kidney disease with stage 1 through stage 4 chronic kidney disease, or unspecified chronic kidney disease: Secondary | ICD-10-CM | POA: Insufficient documentation

## 2016-10-21 DIAGNOSIS — S8992XA Unspecified injury of left lower leg, initial encounter: Secondary | ICD-10-CM | POA: Diagnosis not present

## 2016-10-21 DIAGNOSIS — Y929 Unspecified place or not applicable: Secondary | ICD-10-CM | POA: Insufficient documentation

## 2016-10-21 LAB — CBC WITH DIFFERENTIAL/PLATELET
Basophils Absolute: 0.1 10*3/uL (ref 0.0–0.1)
Basophils Relative: 1 %
EOS ABS: 0.3 10*3/uL (ref 0.0–0.7)
Eosinophils Relative: 2 %
HCT: 35.5 % — ABNORMAL LOW (ref 36.0–46.0)
HEMOGLOBIN: 11.4 g/dL — AB (ref 12.0–15.0)
LYMPHS ABS: 1.1 10*3/uL (ref 0.7–4.0)
Lymphocytes Relative: 8 %
MCH: 31.1 pg (ref 26.0–34.0)
MCHC: 32.1 g/dL (ref 30.0–36.0)
MCV: 97 fL (ref 78.0–100.0)
MONO ABS: 1.4 10*3/uL — AB (ref 0.1–1.0)
MONOS PCT: 10 %
NEUTROS PCT: 79 %
Neutro Abs: 11 10*3/uL — ABNORMAL HIGH (ref 1.7–7.7)
PLATELETS: 379 10*3/uL (ref 150–400)
RBC: 3.66 MIL/uL — ABNORMAL LOW (ref 3.87–5.11)
RDW: 13.9 % (ref 11.5–15.5)
WBC: 13.8 10*3/uL — ABNORMAL HIGH (ref 4.0–10.5)

## 2016-10-21 LAB — URINALYSIS, ROUTINE W REFLEX MICROSCOPIC
Bilirubin Urine: NEGATIVE
GLUCOSE, UA: NEGATIVE mg/dL
Hgb urine dipstick: NEGATIVE
KETONES UR: NEGATIVE mg/dL
NITRITE: NEGATIVE
PROTEIN: NEGATIVE mg/dL
Specific Gravity, Urine: 1.015 (ref 1.005–1.030)
pH: 5.5 (ref 5.0–8.0)

## 2016-10-21 LAB — BASIC METABOLIC PANEL
Anion gap: 10 (ref 5–15)
BUN: 46 mg/dL — AB (ref 6–20)
CHLORIDE: 97 mmol/L — AB (ref 101–111)
CO2: 28 mmol/L (ref 22–32)
CREATININE: 2.1 mg/dL — AB (ref 0.44–1.00)
Calcium: 12.9 mg/dL — ABNORMAL HIGH (ref 8.9–10.3)
GFR calc non Af Amer: 21 mL/min — ABNORMAL LOW (ref >60)
GFR, EST AFRICAN AMERICAN: 25 mL/min — AB (ref >60)
GLUCOSE: 124 mg/dL — AB (ref 65–99)
Potassium: 4.1 mmol/L (ref 3.5–5.1)
Sodium: 135 mmol/L (ref 135–145)

## 2016-10-21 LAB — URINALYSIS, MICROSCOPIC (REFLEX)

## 2016-10-21 MED ORDER — LIDOCAINE 4 % EX CREA
TOPICAL_CREAM | CUTANEOUS | Status: AC
  Administered 2016-10-21: 1 via TOPICAL
  Filled 2016-10-21: qty 5

## 2016-10-21 MED ORDER — LIDOCAINE 4 % EX CREA
TOPICAL_CREAM | Freq: Once | CUTANEOUS | Status: AC
  Administered 2016-10-21: 1 via TOPICAL

## 2016-10-21 MED ORDER — CEPHALEXIN 250 MG PO CAPS
500.0000 mg | ORAL_CAPSULE | Freq: Once | ORAL | Status: AC
  Administered 2016-10-21: 500 mg via ORAL
  Filled 2016-10-21: qty 2

## 2016-10-21 MED ORDER — HEPARIN SOD (PORK) LOCK FLUSH 100 UNIT/ML IV SOLN
INTRAVENOUS | Status: AC
  Administered 2016-10-21: 500 [IU]
  Filled 2016-10-21: qty 5

## 2016-10-21 MED ORDER — CEPHALEXIN 500 MG PO CAPS: 500.0000 mg | ORAL_CAPSULE | Freq: Two times a day (BID) | ORAL | 0 refills | Status: DC

## 2016-10-21 MED ORDER — SODIUM CHLORIDE 0.9 % IV BOLUS (SEPSIS)
500.0000 mL | Freq: Once | INTRAVENOUS | Status: AC
  Administered 2016-10-21: 500 mL via INTRAVENOUS

## 2016-10-21 MED FILL — CEPHALEXIN 500 MG CAPSULE: 500 | 7 days supply | Qty: 14 | Fill #0

## 2016-10-21 NOTE — ED Notes (Signed)
Pt on monitor 

## 2016-10-21 NOTE — ED Triage Notes (Signed)
Pt reports she fell x 2 last night. C/o generalized pain and soreness. Has bruising to BLE and c/o pain in right foot

## 2016-10-21 NOTE — ED Notes (Signed)
MD at bedside discussing results with patient and family. 

## 2016-10-21 NOTE — ED Notes (Signed)
ED Provider at bedside. 

## 2016-10-21 NOTE — ED Notes (Signed)
Portacath deaccessed. Tolerated well.

## 2016-10-21 NOTE — ED Notes (Signed)
Patient transported to X-ray 

## 2016-10-21 NOTE — ED Notes (Signed)
Pt. returned from XR. 

## 2016-10-21 NOTE — ED Provider Notes (Signed)
[facility] Provider Note   CSN: [id number] Arrival date & time: 10/21/16  1038     History   Chief Complaint Chief Complaint  Patient presents with  . Fall    HPI Marissa Cruz is a 82 y.o. female.  Patient is a 82 year old with a history of nonoperable squamous cell carcinoma the lung, currently on immunotherapy as well as hypertension, chronic anemia, coronary artery disease and gout 2 presents after a fall. She states that she is wheelchair-bound. She lives at home on her own although she has an AV comes in 3 days a week. She has family nearby. She states that last night she was wearing her wheelchair up to the freezer inserted up to get something out of the freezer and got weak and fell. She states she is generally weak at baseline but over the last couple of weeks she seems to have a little bit more weakness than she normally does. She complains of pain and swelling to her right foot and ankle from the fall. She also has a skin tear to her right leg. She denies any other injuries. She states that she did not hit her head. She denies any neck or back pain. She denies any cough or chest congestion. No known fevers. No vomiting or diarrhea. She's having normal bowel movements. No chest pain or shortness of breath. No urinary symptoms.      Past Medical History:  Diagnosis Date  . Age-related macular degeneration, dry, right eye   . Age-related macular degeneration, wet, left eye ([place])   . Anemia   . Anxiety   . Arthritis    "thumbs, hands" (08/18/2014)  . CAP (community acquired pneumonia) 08/19/2014  . Carotid artery disease ([place])   . Chicken pox   . Chronic lower back pain   . Colon polyp   . Depression   . Diverticulitis   . GERD (gastroesophageal reflux disease)   . Goals of care, counseling/discussion 03/07/2016  . History of blood transfusion 1976; 2013   "w/hysterectomy; hip OR"  . History of gout   . Hypertension   . Iron deficiency anemia due to  chronic blood loss 06/06/2016  . 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 Kidney disease   . 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 TIA (transient ischemic attack) ~ 2009 X 2  . Vocal cord cancer ([place]) ~ 2009   S/P radiation    Patient Active Problem List   Diagnosis Date Noted  . Hematochezia   . Angiodysplasia of colon   . Acute blood loss anemia   . GI bleed 07/13/2016  . Rectal bleeding 07/13/2016  . Diarrhea 07/13/2016  . Iron deficiency anemia due to chronic blood loss 06/06/2016  . 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 Goals of care, counseling/discussion 03/07/2016  . Malignant neoplasm of lower lobe of right lung ([place]) 02/08/2016  . Lung mass 12/28/2015  . Protein-calorie malnutrition ([place]) 08/20/2014  . Pressure ulcer 08/20/2014  . CAP (community acquired pneumonia) 08/19/2014  . Tobacco use disorder 08/19/2014  . CKD (chronic kidney disease) stage 4, GFR 15-29 ml/min ([facility]) 08/19/2014  . Occult blood in stools 01/31/2014  . Anemia 01/31/2014  . Benign neoplasm of transverse colon 01/31/2014  . Hematuria 01/07/2014  . Leukocytes in urine 01/07/2014  . Acute bronchitis 01/07/2014  . Back pain 01/07/2014  . 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 HTN (hypertension) 10/21/2013  . Generalized anxiety disorder 10/21/2013  . Gout 10/21/2013  .  GERD (gastroesophageal reflux disease) 10/21/2013    Past Surgical History:  Procedure Laterality Date  . ABDOMINAL HYSTERECTOMY  1976  . APPENDECTOMY  1954  . CAROTID ENDARTERECTOMY Left ~ 2014  . CATARACT EXTRACTION W/ INTRAOCULAR LENS  IMPLANT, BILATERAL Bilateral ~ 2009  . COLONOSCOPY N/A 01/31/2014   Procedure: COLONOSCOPY;  Surgeon: Ladene Artist, MD;  Location: [facility] END[facility];  Service: Endoscopy;  Laterality: N/A;  . COLONOSCOPY N/A 07/15/2016   Procedure: COLONOSCOPY;  Surgeon: Milus Banister, MD;  Location: [facility] ENDOSCOPY;  Service: Endoscopy;  Laterality: N/A;  . DILATION AND CURETTAGE OF UTERUS    .  IR GENERIC HISTORICAL  03/12/2016   IR US GUIDE VASC ACCESS RIGHT 03/12/2016 Jacqulynn Cadet, MD [facility]  . IR GENERIC HISTORICAL  03/12/2016   IR FLUORO GUIDE PORT INSERTION RIGHT 03/12/2016 Jacqulynn Cadet, MD [facility]  . JOINT REPLACEMENT    . MOUTH FLOOR MASS EXCISION  03/2014   "removed cancer underneath my tongue"  . TONSILLECTOMY  1941  . TOTAL HIP ARTHROPLASTY Left 2013    OB History    No data available       Home Medications    Prior to Admission medications   Medication Sig Start Date End Date Taking? Authorizing Provider  allopurinol (ZYLOPRIM) 100 MG tablet TAKE 1 TABLET BY MOUTH AT BEDTIME 09/24/16   Carollee Herter, Alferd Apa, DO  ALPRAZolam (XANAX) 0.25 MG tablet Take 1 tablet (0.25 mg total) by mouth 3 (three) times daily as needed for anxiety. 09/26/16   Volanda Napoleon, MD  amLODipine (NORVASC) 5 MG tablet TAKE 1 TABLET BY MOUTH DAILY FOR HIGH BLOOD PRESSURE 08/20/16   Carollee Herter, Alferd Apa, DO  aspirin 81 MG tablet Take 81 mg by mouth daily.    [provider]  cephALEXin (KEFLEX) 500 MG capsule Take 1 capsule (500 mg total) by mouth 2 (two) times daily. 10/21/16   Malvin Johns, MD  cetirizine (ZYRTEC) 10 MG tablet Take 10 mg by mouth at bedtime.    [provider]  docusate sodium (COLACE) 100 MG capsule Take 100 mg by mouth 2 (two) times daily.     [provider]  estrogens, conjugated, ([name]) 0.625 MG tablet Take 1 tablet by mouth every day for 21 days, then do not take for 7 days. 08/29/16   Roma Schanz R, DO  ferrous sulfate 325 (65 FE) MG EC tablet Take 325 mg by mouth 2 (two) times daily.    [provider]  lidocaine-prilocaine (EMLA) cream Apply to affected area once 03/13/16   Ennever, Rudell Cobb, MD  LORazepam (ATIVAN) 0.5 MG tablet Take 1 tablet (0.5 mg total) by mouth every 6 (six) hours as needed (Nausea or vomiting). Patient not taking: Reported on 09/26/2016 03/13/16   Volanda Napoleon, MD  megestrol (MEGACE)  40 MG/ML suspension TAKE 10 ML BY MOUTH DAILY 09/25/16   Volanda Napoleon, MD  metoprolol succinate (TOPROL-XL) 25 MG 24 hr tablet TAKE 1 TABLET(25 MG) BY MOUTH EVERY MORNING 08/20/16   Carollee Herter, Kendrick Fries R, DO  mirtazapine (REMERON SOL-TAB) 15 MG disintegrating tablet DISSOLVE 1 TABLET ON THE TONGUE AT BEDTIME 09/09/16   Mosie Lukes, MD  Multiple Vitamins-Minerals (HM MULTIVITAMIN ADULT GUMMY) CHEW Chew 2 tablets by mouth every morning.    [provider]  nicotine (NICODERM CQ - DOSED IN MG/24 HOURS) 14 mg/24hr patch Place 14 mg onto the skin daily.    [provider]  omeprazole ([place])  20 MG capsule TAKE 1 CAPSULE BY MOUTH TWICE DAILY BEFORE A MEAL 10/14/16   Carollee Herter, Yvonne R, DO  ondansetron (ZOFRAN-ODT) 8 MG disintegrating tablet Take 1 tablet (8 mg total) by mouth every 8 (eight) hours as needed for nausea or vomiting. Reported on 05/17/2015 03/05/16   Roma Schanz R, DO  PARoxetine (PAXIL) 10 MG tablet TAKE 1 TABLET BY MOUTH DAILY FOR DEPRESSION 08/20/16   Carollee Herter, Alferd Apa, DO  traMADol (ULTRAM) 50 MG tablet Take 1 tablet (50 mg total) by mouth every 6 (six) hours as needed. 09/26/16   Volanda Napoleon, MD  traZODone (DESYREL) 50 MG tablet TAKE 1 TABLET(50 MG) BY MOUTH AT BEDTIME 09/12/16   Ann Held, DO    Family History Family History  Problem Relation Age of Onset  . Hypertension Father   . Skin cancer Father   . Emphysema Paternal Grandfather   . Liver cancer Mother   . Stomach cancer Maternal Grandfather     Social History Social History  Substance Use Topics  . Smoking status: Light Tobacco Smoker    Packs/day: 1.00    Years: 60.00    Types: Cigarettes    Last attempt to quit: 05/15/2016  . Smokeless tobacco: Never Used     Comment: states "one cigarette a week"  . Alcohol use 4.2 oz/week    7 Shots of liquor per week     Comment:  " shots brandy q night"     Allergies   Morphine and related and Codeine   Review of  Systems Review of Systems  Constitutional: Negative for chills, diaphoresis, fatigue and fever.  HENT: Negative for congestion, rhinorrhea and sneezing.   Eyes: Negative.   Respiratory: Negative for cough, chest tightness and shortness of breath.   Cardiovascular: Negative for chest pain and leg swelling.  Gastrointestinal: Negative for abdominal pain, blood in stool, diarrhea, nausea and vomiting.  Genitourinary: Negative for difficulty urinating, flank pain, frequency and hematuria.  Musculoskeletal: Positive for arthralgias. Negative for back pain.  Skin: Positive for wound. Negative for rash.  Neurological: Positive for weakness. Negative for dizziness, speech difficulty, numbness and headaches.     Physical Exam Updated Vital Signs BP (!) 179/96 (BP Location: Right Arm)   Pulse 89   Temp 98.5 F (36.9 C) (Oral)   Resp 20   Ht 5\' 7"  (1.702 m)   Wt 43.1 kg (95 lb)   SpO2 97%   BMI 14.88 kg/m   Physical Exam  Constitutional: She is oriented to person, place, and time. She appears well-developed and well-nourished.  HENT:  Head: Normocephalic and atraumatic.  Eyes: Pupils are equal, round, and reactive to light.  Neck: Normal range of motion. Neck supple.  No pain to the cervical thoracic or lumbosacral spine  Cardiovascular: Normal rate, regular rhythm and normal heart sounds.   Pulmonary/Chest: Effort normal and breath sounds normal. No respiratory distress. She has no wheezes. She has no rales. She exhibits no tenderness.  No pain on palpation of the ribs  Abdominal: Soft. Bowel sounds are normal. There is no tenderness. There is no rebound and no guarding.  Musculoskeletal: Normal range of motion. She exhibits no edema.  Patient has swelling and ecchymosis to the dorsum of the right foot. She has tenderness diffusely across the dorsum of the right foot as well as some tenderness on palpation of the lateral ankle. There is no bony tenderness to the lower leg. No tenderness  to the  knee or hip. She has interval lesion type skin tear overlying her right upper tibia. There is no other pain on palpation or range of motion extremities.  Lymphadenopathy:    She has no cervical adenopathy.  Neurological: She is alert and oriented to person, place, and time.  Patient moves all extremities symmetrically without focal deficits  Skin: Skin is warm and dry. No rash noted.  Psychiatric: She has a normal mood and affect.     ED Treatments / Results  Labs (all labs ordered are listed, but only abnormal results are displayed) Labs Reviewed  BASIC METABOLIC PANEL - Abnormal; Notable for the following:       Result Value   Chloride 97 (*)    Glucose, Bld 124 (*)    BUN 46 (*)    Creatinine, Ser 2.10 (*)    Calcium 12.9 (*)    GFR calc non Af Amer 21 (*)    GFR calc Af Amer 25 (*)    All other components within normal limits  CBC WITH DIFFERENTIAL/PLATELET - Abnormal; Notable for the following:    WBC 13.8 (*)    RBC 3.66 (*)    Hemoglobin 11.4 (*)    HCT 35.5 (*)    Neutro Abs 11.0 (*)    Monocytes Absolute 1.4 (*)    All other components within normal limits  URINALYSIS, ROUTINE W REFLEX MICROSCOPIC - Abnormal; Notable for the following:    APPearance CLOUDY (*)    Leukocytes, UA MODERATE (*)    All other components within normal limits  URINALYSIS, MICROSCOPIC (REFLEX) - Abnormal; Notable for the following:    Bacteria, UA FEW (*)    Squamous Epithelial / LPF 0-5 (*)    All other components within normal limits  URINE CULTURE    EKG  EKG Interpretation  Date/Time:  Monday October 21 2016 11:30:39 EDT Ventricular Rate:  88 PR Interval:    QRS Duration: 91 QT Interval:  379 QTC Calculation: 459 R Axis:   51 Text Interpretation:  Sinus rhythm Atrial premature complex Short PR interval Minimal ST depression, lateral leads ST elevation, consider anterolateral injury since last tracing no significant change Confirmed by Malvin Johns [phone]) on 10/21/2016  3:08:51 PM       Radiology Dg Chest 2 View  Result Date: 10/21/2016 CLINICAL DATA:  Patient status post fall.  Body aches.  Soreness. EXAM: CHEST  2 VIEW COMPARISON:  PET-CT 10/10/2016; chest radiograph 02/01/2016. FINDINGS: Right anterior chest wall Port-A-Cath is present with tip projecting over the superior vena cava. Monitoring leads overlie the patient. Stable cardiac and mediastinal contours. Re- demonstrated right lower lobe triangular opacity, compatible with lung carcinoma. Biapical pleuroparenchymal thickening. No pleural effusion or pneumothorax. Thoracic spine degenerative changes. IMPRESSION: No acute cardiopulmonary process. Electronically Signed   By: Lovey Newcomer M.D.   On: 10/21/2016 12:02   Dg Ankle Complete Right  Result Date: 10/21/2016 CLINICAL DATA:  [place] and injured right ankle. EXAM: RIGHT ANKLE - COMPLETE 3+ VIEW COMPARISON:  None. FINDINGS: Moderate osteoporosis. The ankle mortise is maintained. No acute ankle fracture. The subtalar joints are maintained and the hindfoot bony structures are intact. Vascular calcifications are noted. Metatarsal neck fractures are noted. IMPRESSION: No acute ankle fracture. Metatarsal neck fractures. Electronically Signed   By: Marijo Sanes M.D.   On: 10/21/2016 12:04   Dg Foot Complete Right  Result Date: 10/21/2016 CLINICAL DATA:  [place] yesterday and injured right foot and ankle. EXAM: RIGHT FOOT COMPLETE - 3+  VIEW COMPARISON:  None. FINDINGS: Moderate diffuse osteoporosis is noted. There are fractures involving the second, third, fourth and fifth metatarsal necks. The third and fourth metatarsal fractures are the most displaced. There is also a nondisplaced fracture involving the proximal phalanx of the second toe. The metatarsal phalangeal joints are maintained. The tarsal metatarsal joints are maintained. Moderate degenerative changes at the first MTP joint and mild hallux valgus deformity. Type 2 os naviculare noted. IMPRESSION: 1.  Fractures of the second, third, fourth and fifth metatarsal necks. 2. Nondisplaced fracture involving the second proximal phalanx. Electronically Signed   By: Marijo Sanes M.D.   On: 10/21/2016 12:03    Procedures Procedures (including critical care time)  Medications Ordered in ED Medications  cephALEXin (KEFLEX) capsule 500 mg (not administered)  sodium chloride 0.9 % bolus 500 mL (0 mLs Intravenous Stopped 10/21/16 1440)  lidocaine (LMX) 4 % cream (1 application Topical Given 10/21/16 1345)     Initial Impression / Assessment and Plan / ED Course  I have reviewed the triage vital signs and the nursing notes.  Pertinent labs & imaging results that were available during my care of the patient were reviewed by me and considered in my medical decision making (see chart for details).     Patient presents after a fall. She has evidence of poor metatarsal fractures. I spoke with Dr.Xu who advised placing patient in an orthopedic boot. This was done in the ED and patient was advised that she needs to follow-up with orthopedics. She also is complaining of a little bit of increased weakness over the last couple weeks. Her creatinine is slightly more increased as compared to her baseline values. She also has evidence of a urinary tract infection. She was started on Keflex. She has an appointment to follow-up with Dr.Ennever in 3 days. I did advise her that she will need to have her creatinine followed. Return precautions were given.  Final Clinical Impressions(s) / ED Diagnoses   Final diagnoses:  Multiple closed fractures of foot, initial encounter  Fall, initial encounter  Elevated serum creatinine  Urinary tract infection without hematuria, site unspecified    New Prescriptions New Prescriptions   CEPHALEXIN (KEFLEX) 500 MG CAPSULE    Take 1 capsule (500 mg total) by mouth 2 (two) times daily.     Malvin Johns, MD 10/21/16 [phone]

## 2016-10-22 LAB — URINE CULTURE

## 2016-10-24 ENCOUNTER — Ambulatory Visit: Payer: Medicare Other

## 2016-10-24 ENCOUNTER — Other Ambulatory Visit (HOSPITAL_BASED_OUTPATIENT_CLINIC_OR_DEPARTMENT_OTHER): Payer: Medicare Other

## 2016-10-24 ENCOUNTER — Ambulatory Visit (HOSPITAL_BASED_OUTPATIENT_CLINIC_OR_DEPARTMENT_OTHER): Payer: Medicare Other

## 2016-10-24 ENCOUNTER — Ambulatory Visit (HOSPITAL_BASED_OUTPATIENT_CLINIC_OR_DEPARTMENT_OTHER): Payer: Medicare Other | Admitting: Hematology & Oncology

## 2016-10-24 VITALS — BP 118/52 | HR 101 | Temp 98.5°F | Resp 17

## 2016-10-24 DIAGNOSIS — C3431 Malignant neoplasm of lower lobe, right bronchus or lung: Secondary | ICD-10-CM

## 2016-10-24 DIAGNOSIS — Z5112 Encounter for antineoplastic immunotherapy: Secondary | ICD-10-CM

## 2016-10-24 DIAGNOSIS — N184 Chronic kidney disease, stage 4 (severe): Secondary | ICD-10-CM

## 2016-10-24 DIAGNOSIS — D500 Iron deficiency anemia secondary to blood loss (chronic): Secondary | ICD-10-CM

## 2016-10-24 DIAGNOSIS — D509 Iron deficiency anemia, unspecified: Secondary | ICD-10-CM

## 2016-10-24 DIAGNOSIS — R918 Other nonspecific abnormal finding of lung field: Secondary | ICD-10-CM

## 2016-10-24 LAB — LACTATE DEHYDROGENASE: LDH: 172 U/L (ref 125–245)

## 2016-10-24 LAB — CBC WITH DIFFERENTIAL (CANCER CENTER ONLY)
BASO#: 0.2 10*3/uL (ref 0.0–0.2)
BASO%: 1.2 % (ref 0.0–2.0)
EOS ABS: 1.1 10*3/uL — AB (ref 0.0–0.5)
EOS%: 7.8 % — ABNORMAL HIGH (ref 0.0–7.0)
HCT: 32.7 % — ABNORMAL LOW (ref 34.8–46.6)
HEMOGLOBIN: 10.4 g/dL — AB (ref 11.6–15.9)
LYMPH#: 1.7 10*3/uL (ref 0.9–3.3)
LYMPH%: 11.6 % — AB (ref 14.0–48.0)
MCH: 32 pg (ref 26.0–34.0)
MCHC: 31.8 g/dL — AB (ref 32.0–36.0)
MCV: 101 fL (ref 81–101)
MONO#: 1.4 10*3/uL — AB (ref 0.1–0.9)
MONO%: 9.8 % (ref 0.0–13.0)
NEUT#: 10.1 10*3/uL — ABNORMAL HIGH (ref 1.5–6.5)
NEUT%: 69.6 % (ref 39.6–80.0)
PLATELETS: 344 10*3/uL (ref 145–400)
RBC: 3.25 10*6/uL — AB (ref 3.70–5.32)
RDW: 13.8 % (ref 11.1–15.7)
WBC: 14.4 10*3/uL — AB (ref 3.9–10.0)

## 2016-10-24 LAB — FERRITIN: FERRITIN: 277 ng/mL — AB (ref 9–269)

## 2016-10-24 LAB — CMP (CANCER CENTER ONLY)
ALBUMIN: 3.2 g/dL — AB (ref 3.3–5.5)
ALK PHOS: 74 U/L (ref 26–84)
ALT(SGPT): 12 U/L (ref 10–47)
AST: 20 U/L (ref 11–38)
BUN, Bld: 39 mg/dL — ABNORMAL HIGH (ref 7–22)
CHLORIDE: 103 meq/L (ref 98–108)
CO2: 28 meq/L (ref 18–33)
Calcium: 11.9 mg/dL — ABNORMAL HIGH (ref 8.0–10.3)
Creat: 1.9 mg/dl — ABNORMAL HIGH (ref 0.6–1.2)
Glucose, Bld: 134 mg/dL — ABNORMAL HIGH (ref 73–118)
POTASSIUM: 3.5 meq/L (ref 3.3–4.7)
SODIUM: 137 meq/L (ref 128–145)
TOTAL PROTEIN: 6.6 g/dL (ref 6.4–8.1)
Total Bilirubin: 0.6 mg/dl (ref 0.20–1.60)

## 2016-10-24 LAB — IRON AND TIBC
%SAT: 23 % (ref 21–57)
IRON: 54 ug/dL (ref 41–142)
TIBC: 233 ug/dL — AB (ref 236–444)
UIBC: 178 ug/dL (ref 120–384)

## 2016-10-24 MED ORDER — SODIUM CHLORIDE 0.9% FLUSH
10.0000 mL | INTRAVENOUS | Status: DC | PRN
  Administered 2016-10-24: 10 mL
  Filled 2016-10-24: qty 10

## 2016-10-24 MED ORDER — HEPARIN SOD (PORK) LOCK FLUSH 100 UNIT/ML IV SOLN
500.0000 [IU] | Freq: Once | INTRAVENOUS | Status: AC | PRN
  Administered 2016-10-24: 500 [IU]
  Filled 2016-10-24: qty 5

## 2016-10-24 MED ORDER — SODIUM CHLORIDE 0.9% FLUSH
3.0000 mL | INTRAVENOUS | Status: DC | PRN
  Filled 2016-10-24: qty 10

## 2016-10-24 MED ORDER — ZOLEDRONIC ACID 4 MG/100ML IV SOLN
4.0000 mg | Freq: Once | INTRAVENOUS | Status: AC
  Administered 2016-10-24: 4 mg via INTRAVENOUS
  Filled 2016-10-24: qty 100

## 2016-10-24 MED ORDER — HEPARIN SOD (PORK) LOCK FLUSH 100 UNIT/ML IV SOLN
250.0000 [IU] | Freq: Once | INTRAVENOUS | Status: DC | PRN
  Filled 2016-10-24: qty 5

## 2016-10-24 MED ORDER — ALTEPLASE 2 MG IJ SOLR
2.0000 mg | Freq: Once | INTRAMUSCULAR | Status: DC | PRN
  Filled 2016-10-24: qty 2

## 2016-10-24 MED ORDER — SODIUM CHLORIDE 0.9 % IV SOLN
200.0000 mg | Freq: Once | INTRAVENOUS | Status: AC
  Administered 2016-10-24: 200 mg via INTRAVENOUS
  Filled 2016-10-24: qty 8

## 2016-10-24 MED ORDER — SODIUM CHLORIDE 0.9 % IV SOLN
Freq: Once | INTRAVENOUS | Status: AC
  Administered 2016-10-24: 12:00:00 via INTRAVENOUS

## 2016-10-24 NOTE — Patient Instructions (Signed)
Implanted Port Home Guide An implanted port is a type of central line that is placed under the skin. Central lines are used to provide IV access when treatment or nutrition needs to be given through a person's veins. Implanted ports are used for long-term IV access. An implanted port may be placed because:  You need IV medicine that would be irritating to the small veins in your hands or arms.  You need long-term IV medicines, such as antibiotics.  You need IV nutrition for a long period.  You need frequent blood draws for lab tests.  You need dialysis.  Implanted ports are usually placed in the chest area, but they can also be placed in the upper arm, the abdomen, or the leg. An implanted port has two main parts:  Reservoir. The reservoir is round and will appear as a small, raised area under your skin. The reservoir is the part where a needle is inserted to give medicines or draw blood.  Catheter. The catheter is a thin, flexible tube that extends from the reservoir. The catheter is placed into a large vein. Medicine that is inserted into the reservoir goes into the catheter and then into the vein.  How will I care for my incision site? Do not get the incision site wet. Bathe or shower as directed by your health care provider. How is my port accessed? Special steps must be taken to access the port:  Before the port is accessed, a numbing cream can be placed on the skin. This helps numb the skin over the port site.  Your health care provider uses a sterile technique to access the port. ? Your health care provider must put on a mask and sterile gloves. ? The skin over your port is cleaned carefully with an antiseptic and allowed to dry. ? The port is gently pinched between sterile gloves, and a needle is inserted into the port.  Only "non-coring" port needles should be used to access the port. Once the port is accessed, a blood return should be checked. This helps ensure that the port  is in the vein and is not clogged.  If your port needs to remain accessed for a constant infusion, a clear (transparent) bandage will be placed over the needle site. The bandage and needle will need to be changed every week, or as directed by your health care provider.  Keep the bandage covering the needle clean and dry. Do not get it wet. Follow your health care provider's instructions on how to take a shower or bath while the port is accessed.  If your port does not need to stay accessed, no bandage is needed over the port.  What is flushing? Flushing helps keep the port from getting clogged. Follow your health care provider's instructions on how and when to flush the port. Ports are usually flushed with saline solution or a medicine called heparin. The need for flushing will depend on how the port is used.  If the port is used for intermittent medicines or blood draws, the port will need to be flushed: ? After medicines have been given. ? After blood has been drawn. ? As part of routine maintenance.  If a constant infusion is running, the port may not need to be flushed.  How long will my port stay implanted? The port can stay in for as long as your health care provider thinks it is needed. When it is time for the port to come out, surgery will be   done to remove it. The procedure is similar to the one performed when the port was put in. When should I seek immediate medical care? When you have an implanted port, you should seek immediate medical care if:  You notice a bad smell coming from the incision site.  You have swelling, redness, or drainage at the incision site.  You have more swelling or pain at the port site or the surrounding area.  You have a fever that is not controlled with medicine.  This information is not intended to replace advice given to you by your health care provider. Make sure you discuss any questions you have with your health care provider. Document  Released: 02/18/2005 Document Revised: 07/27/2015 Document Reviewed: 10/26/2012 [employer] Interactive Patient Education  2017 [employer]  

## 2016-10-24 NOTE — Patient Instructions (Addendum)
Zoledronic Acid injection (Hypercalcemia, Oncology) What is this medicine? ZOLEDRONIC ACID (ZOE le dron ik AS id) lowers the amount of calcium loss from bone. It is used to treat too much calcium in your blood from cancer. It is also used to prevent complications of cancer that has spread to the bone. This medicine may be used for other purposes; ask your health care provider or pharmacist if you have questions. COMMON BRAND NAME(S): Zometa What should I tell my health care provider before I take this medicine? They need to know if you have any of these conditions: -aspirin-sensitive asthma -cancer, especially if you are receiving medicines used to treat cancer -dental disease or wear dentures -infection -kidney disease -receiving corticosteroids like dexamethasone or prednisone -an unusual or allergic reaction to zoledronic acid, other medicines, foods, dyes, or preservatives -pregnant or trying to get pregnant -breast-feeding How should I use this medicine? This medicine is for infusion into a vein. It is given by a health care professional in a hospital or clinic setting. Talk to your pediatrician regarding the use of this medicine in children. Special care may be needed. Overdosage: If you think you have taken too much of this medicine contact a poison control center or emergency room at once. NOTE: This medicine is only for you. Do not share this medicine with others. What if I miss a dose? It is important not to miss your dose. Call your doctor or health care professional if you are unable to keep an appointment. What may interact with this medicine? -certain antibiotics given by injection -NSAIDs, medicines for pain and inflammation, like ibuprofen or naproxen -some diuretics like bumetanide, furosemide -teriparatide -thalidomide This list may not describe all possible interactions. Give your health care provider a list of all the medicines, herbs, non-prescription drugs, or  dietary supplements you use. Also tell them if you smoke, drink alcohol, or use illegal drugs. Some items may interact with your medicine. What should I watch for while using this medicine? Visit your doctor or health care professional for regular checkups. It may be some time before you see the benefit from this medicine. Do not stop taking your medicine unless your doctor tells you to. Your doctor may order blood tests or other tests to see how you are doing. Women should inform their doctor if they wish to become pregnant or think they might be pregnant. There is a potential for serious side effects to an unborn child. Talk to your health care professional or pharmacist for more information. You should make sure that you get enough calcium and vitamin D while you are taking this medicine. Discuss the foods you eat and the vitamins you take with your health care professional. Some people who take this medicine have severe bone, joint, and/or muscle pain. This medicine may also increase your risk for jaw problems or a broken thigh bone. Tell your doctor right away if you have severe pain in your jaw, bones, joints, or muscles. Tell your doctor if you have any pain that does not go away or that gets worse. Tell your dentist and dental surgeon that you are taking this medicine. You should not have major dental surgery while on this medicine. See your dentist to have a dental exam and fix any dental problems before starting this medicine. Take good care of your teeth while on this medicine. Make sure you see your dentist for regular follow-up appointments. What side effects may I notice from receiving this medicine? Side effects that   you should report to your doctor or health care professional as soon as possible: -allergic reactions like skin rash, itching or hives, swelling of the face, lips, or tongue -anxiety, confusion, or depression -breathing problems -changes in vision -eye pain -feeling faint or  lightheaded, falls -jaw pain, especially after dental work -mouth sores -muscle cramps, stiffness, or weakness -redness, blistering, peeling or loosening of the skin, including inside the mouth -trouble passing urine or change in the amount of urine Side effects that usually do not require medical attention (report to your doctor or health care professional if they continue or are bothersome): -bone, joint, or muscle pain -constipation -diarrhea -fever -hair loss -irritation at site where injected -loss of appetite -nausea, vomiting -stomach upset -trouble sleeping -trouble swallowing -weak or tired This list may not describe all possible side effects. Call your doctor for medical advice about side effects. You may report side effects to FDA at 1-800-FDA-1088. Where should I keep my medicine? This drug is given in a hospital or clinic and will not be stored at home. NOTE: This sheet is a summary. It may not cover all possible information. If you have questions about this medicine, talk to your doctor, pharmacist, or health care provider.  2018 Elsevier/Gold Standard (2013-07-17 14:19:39) Pembrolizumab injection What is this medicine? PEMBROLIZUMAB (pem broe liz ue mab) is a monoclonal antibody. It is used to treat melanoma, head and neck cancer, Hodgkin lymphoma, non-small cell lung cancer, urothelial cancer, stomach cancer, and cancers that have a certain genetic condition. This medicine may be used for other purposes; ask your health care provider or pharmacist if you have questions. COMMON BRAND NAME(S): Keytruda What should I tell my health care provider before I take this medicine? They need to know if you have any of these conditions: -diabetes -immune system problems -inflammatory bowel disease -liver disease -lung or breathing disease -lupus -organ transplant -an unusual or allergic reaction to pembrolizumab, other medicines, foods, dyes, or preservatives -pregnant or  trying to get pregnant -breast-feeding How should I use this medicine? This medicine is for infusion into a vein. It is given by a health care professional in a hospital or clinic setting. A special MedGuide will be given to you before each treatment. Be sure to read this information carefully each time. Talk to your pediatrician regarding the use of this medicine in children. While this drug may be prescribed for selected conditions, precautions do apply. Overdosage: If you think you have taken too much of this medicine contact a poison control center or emergency room at once. NOTE: This medicine is only for you. Do not share this medicine with others. What if I miss a dose? It is important not to miss your dose. Call your doctor or health care professional if you are unable to keep an appointment. What may interact with this medicine? Interactions have not been studied. Give your health care provider a list of all the medicines, herbs, non-prescription drugs, or dietary supplements you use. Also tell them if you smoke, drink alcohol, or use illegal drugs. Some items may interact with your medicine. This list may not describe all possible interactions. Give your health care provider a list of all the medicines, herbs, non-prescription drugs, or dietary supplements you use. Also tell them if you smoke, drink alcohol, or use illegal drugs. Some items may interact with your medicine. What should I watch for while using this medicine? Your condition will be monitored carefully while you are receiving this medicine. You may  need blood work done while you are taking this medicine. Do not become pregnant while taking this medicine or for 4 months after stopping it. Women should inform their doctor if they wish to become pregnant or think they might be pregnant. There is a potential for serious side effects to an unborn child. Talk to your health care professional or pharmacist for more information. Do  not breast-feed an infant while taking this medicine or for 4 months after the last dose. What side effects may I notice from receiving this medicine? Side effects that you should report to your doctor or health care professional as soon as possible: -allergic reactions like skin rash, itching or hives, swelling of the face, lips, or tongue -bloody or black, tarry -breathing problems -changes in vision -chest pain -chills -constipation -cough -dizziness or feeling faint or lightheaded -fast or irregular heartbeat -fever -flushing -hair loss -low blood counts - this medicine may decrease the number of white blood cells, red blood cells and platelets. You may be at increased risk for infections and bleeding. -muscle pain -muscle weakness -persistent headache -signs and symptoms of high blood sugar such as dizziness; dry mouth; dry skin; fruity breath; nausea; stomach pain; increased hunger or thirst; increased urination -signs and symptoms of kidney injury like trouble passing urine or change in the amount of urine -signs and symptoms of liver injury like dark urine, light-colored stools, loss of appetite, nausea, right upper belly pain, yellowing of the eyes or skin -stomach pain -sweating -weight loss Side effects that usually do not require medical attention (report to your doctor or health care professional if they continue or are bothersome): -decreased appetite -diarrhea -tiredness This list may not describe all possible side effects. Call your doctor for medical advice about side effects. You may report side effects to FDA at 1-800-FDA-1088. Where should I keep my medicine? This drug is given in a hospital or clinic and will not be stored at home. NOTE: This sheet is a summary. It may not cover all possible information. If you have questions about this medicine, talk to your doctor, pharmacist, or health care provider.  2018 Elsevier/Gold Standard (2015-11-28 12:29:36)

## 2016-10-24 NOTE — Progress Notes (Signed)
Hematology and Oncology Follow Up Visit  Marissa Cruz [id number] Jul 11, 1934 82 y.o. 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 Unfortunately, she has a broken right foot. She tried to get into the refrigerator. She try to get up from her wheelchair. She fell. She broke several bones in her foot. She is not an operative candidate. She has a boot on her right foot.  I'm so sorry that this happened to her.  Her PET scan was done a week or so ago. Thankfully, the PET scan showed that everything looks relatively stable. She had no growth. There is no new areas of metastatic cancer.  She just does not eat much. She has never ate much.  She's had no bleeding.  She's had no issues with bowels or bladder.  She's had no cough. She has a little bit of a sore throat. This happened a couple days ago. The right side of her throat is a little sore. She is not hoarse.   Overall, her performance status is ECOG 2  Medications:  Allergies as of 10/24/2016      Reactions   Morphine And Related Other (See Comments)   Hallucinations and combative   Codeine Nausea Only      Medication List       Accurate as of 10/24/16  1:12 PM. Always use your most recent med list.          allopurinol 100 MG tablet Commonly known as:  ZYLOPRIM TAKE 1 TABLET BY MOUTH AT BEDTIME   ALPRAZolam 0.25 MG tablet Commonly known as:  XANAX Take 1 tablet (0.25 mg total) by mouth 3 (three) times daily as needed for anxiety.   amLODipine 5 MG tablet Commonly known as:  NORVASC TAKE 1 TABLET BY MOUTH DAILY FOR HIGH BLOOD PRESSURE   aspirin 81 MG tablet Take 81 mg by mouth daily.   cephALEXin 500  MG capsule Commonly known as:  KEFLEX Take 1 capsule (500 mg total) by mouth 2 (two) times daily.   cetirizine 10 MG tablet Commonly known as:  ZYRTEC Take 10 mg by mouth at bedtime.   docusate sodium 100 MG capsule Commonly known as:  COLACE Take 100 mg by mouth 2 (two) times daily.   estrogens (conjugated) 0.625 MG tablet Commonly known as:  [name] Take 1 tablet by mouth every day for 21 days, then do not take for 7 days.   ferrous sulfate 325 (65 FE) MG EC tablet Take 325 mg by mouth 2 (two) times daily.   HM MULTIVITAMIN ADULT GUMMY Chew Chew 2 tablets by mouth every morning.   lidocaine-prilocaine cream Commonly known as:  EMLA Apply to affected area once   LORazepam 0.5 MG tablet Commonly known as:  ATIVAN Take 1 tablet (0.5 mg total) by mouth every 6 (six) hours as needed (Nausea or vomiting).   megestrol 40 MG/ML suspension Commonly known as:  MEGACE TAKE 10 ML BY MOUTH DAILY   metoprolol succinate 25 MG 24 hr tablet Commonly known as:  TOPROL-XL TAKE 1 TABLET(25 MG) BY MOUTH EVERY MORNING   mirtazapine 15 MG disintegrating tablet Commonly known as:  REMERON SOL-TAB DISSOLVE 1 TABLET ON  THE TONGUE AT BEDTIME   nicotine 14 mg/24hr patch Commonly known as:  NICODERM CQ - dosed in mg/24 hours Place 14 mg onto the skin daily.   omeprazole 20 MG capsule Commonly known as:  PRILOSEC TAKE 1 CAPSULE BY MOUTH TWICE DAILY BEFORE A MEAL   ondansetron 8 MG disintegrating tablet Commonly known as:  ZOFRAN-ODT Take 1 tablet (8 mg total) by mouth every 8 (eight) hours as needed for nausea or vomiting. Reported on 05/17/2015   PARoxetine 10 MG tablet Commonly known as:  PAXIL TAKE 1 TABLET BY MOUTH DAILY FOR DEPRESSION   traMADol 50 MG tablet Commonly known as:  ULTRAM Take 1 tablet (50 mg total) by mouth every 6 (six) hours as needed.   traZODone 50 MG tablet Commonly known as:  DESYREL TAKE 1 TABLET(50 MG) BY MOUTH AT BEDTIME       Allergies:    Allergies  Allergen Reactions  . Morphine And Related Other (See Comments)    Hallucinations and combative  . Codeine Nausea Only    Past Medical History, Surgical history, Social history, and Family History were reviewed and updated.  Review of Systems: As stated in the interim history  Physical Exam:  oral temperature is 98.5 F (36.9 C). Her blood pressure is 118/52 (abnormal) and her pulse is 101 (abnormal). Her respiration is 17 and oxygen saturation is 100%.   Wt Readings from Last 3 Encounters:  10/21/16 95 lb (43.1 kg)  09/26/16 96 lb 6.4 oz (43.7 kg)  09/06/16 96 lb (43.5 kg)   Thin white female. Head and neck exam shows no ocular or oral lesions. I cannot see any pharyngeal erythema. There is no adenopathy in her neck. She has temporal muscle wasting. Lungs show decreased breath sounds both lung fields. Cardiac exam regular rate and rhythm with no murmurs, rubs or bruits. Abdomen is soft. She has good bowel sounds. There is no fluid wave. There is no palpable liver or spleen tip. Extremities shows severe muscle atrophy in the lower legs. This is chronic. She has a boot on the right foot. She has decreased strength in upper and lower extremities. Neurological exam shows no focal neurological deficits.   Lab Results  Component Value Date   WBC 14.4 (H) 10/24/2016   HGB 10.4 (L) 10/24/2016   HCT 32.7 (L) 10/24/2016   MCV 101 10/24/2016   PLT 344 10/24/2016   Lab Results  Component Value Date   FERRITIN 292 (H) 09/06/2016   IRON 61 09/06/2016   TIBC 243 09/06/2016   UIBC 182 09/06/2016   IRONPCTSAT 25 09/06/2016   Lab Results  Component Value Date   RBC 3.25 (L) 10/24/2016   No results found for: KPAFRELGTCHN, LAMBDASER, KAPLAMBRATIO No results found for: IGGSERUM, IGA, IGMSERUM No results found for: Odetta Pink, SPEI   Chemistry      Component Value Date/Time   NA 137 10/24/2016 1028   NA 136 03/07/2016  1055   K 3.5 10/24/2016 1028   K 4.1 03/07/2016 1055   CL 103 10/24/2016 1028   CO2 28 10/24/2016 1028   CO2 26 03/07/2016 1055   BUN 39 (H) 10/24/2016 1028   BUN 28.7 (H) 03/07/2016 1055   CREATININE 1.9 (H) 10/24/2016 1028   CREATININE 1.3 (H) 03/07/2016 1055      Component Value Date/Time   CALCIUM 11.9 (H) 10/24/2016 1028   CALCIUM 9.5 03/07/2016 1055   ALKPHOS 74 10/24/2016 1028   ALKPHOS  83 03/07/2016 1055   AST 20 10/24/2016 1028   AST 17 03/07/2016 1055   ALT 12 10/24/2016 1028   ALT 9 03/07/2016 1055   BILITOT 0.60 10/24/2016 1028   BILITOT 0.26 03/07/2016 1055      Impression and Plan: Ms. Marmol is a 35 your old white female. She has locally advanced non-small cell lung cancer of the right lower lobe. She is on immunotherapy. This is the only therapy that she can tolerate. She seems to be holding her own.  I'm surprised by her hypercalcemia. This is a problem. I will not this was a factor with her falling.  She needs to have this treated. I will give her Zometa today.  We will still treat with the [place].  Rodena Piety have her come back in 1 week for lab work so we can see what her calcium is doing.  We will have her come back in 3 weeks for her next cycle of treatment.  I spent about 30 minutes with she and her daughter. I explained what the problem was with the hypercalcemia. I went over the consequences of hypercalcemia that is not treated. They understand this.   Volanda Napoleon, MD 8/23/20181:12 PM

## 2016-10-26 ENCOUNTER — Encounter (INDEPENDENT_AMBULATORY_CARE_PROVIDER_SITE_OTHER): Payer: Self-pay | Admitting: Orthopaedic Surgery

## 2016-10-28 ENCOUNTER — Ambulatory Visit (INDEPENDENT_AMBULATORY_CARE_PROVIDER_SITE_OTHER): Payer: Medicare Other

## 2016-10-28 ENCOUNTER — Emergency Department (HOSPITAL_BASED_OUTPATIENT_CLINIC_OR_DEPARTMENT_OTHER)
Admission: EM | Admit: 2016-10-28 | Discharge: 2016-10-28 | Disposition: A | Discharge status: Home / Self Care | Payer: Medicare Other | Attending: Emergency Medicine | Admitting: Emergency Medicine

## 2016-10-28 ENCOUNTER — Emergency Department (HOSPITAL_BASED_OUTPATIENT_CLINIC_OR_DEPARTMENT_OTHER): Payer: Medicare Other

## 2016-10-28 ENCOUNTER — Ambulatory Visit (INDEPENDENT_AMBULATORY_CARE_PROVIDER_SITE_OTHER): Payer: Medicare Other | Admitting: Orthopaedic Surgery

## 2016-10-28 ENCOUNTER — Encounter (INDEPENDENT_AMBULATORY_CARE_PROVIDER_SITE_OTHER): Payer: Self-pay | Admitting: Orthopaedic Surgery

## 2016-10-28 ENCOUNTER — Encounter (HOSPITAL_BASED_OUTPATIENT_CLINIC_OR_DEPARTMENT_OTHER): Payer: Self-pay | Admitting: *Deleted

## 2016-10-28 DIAGNOSIS — S92354A Nondisplaced fracture of fifth metatarsal bone, right foot, initial encounter for closed fracture: Secondary | ICD-10-CM

## 2016-10-28 DIAGNOSIS — N184 Chronic kidney disease, stage 4 (severe): Secondary | ICD-10-CM | POA: Insufficient documentation

## 2016-10-28 DIAGNOSIS — I129 Hypertensive chronic kidney disease with stage 1 through stage 4 chronic kidney disease, or unspecified chronic kidney disease: Secondary | ICD-10-CM | POA: Diagnosis not present

## 2016-10-28 DIAGNOSIS — I509 Heart failure, unspecified: Secondary | ICD-10-CM | POA: Diagnosis not present

## 2016-10-28 DIAGNOSIS — S92344A Nondisplaced fracture of fourth metatarsal bone, right foot, initial encounter for closed fracture: Secondary | ICD-10-CM

## 2016-10-28 DIAGNOSIS — M79672 Pain in left foot: Secondary | ICD-10-CM | POA: Diagnosis present

## 2016-10-28 DIAGNOSIS — R627 Adult failure to thrive: Secondary | ICD-10-CM | POA: Insufficient documentation

## 2016-10-28 DIAGNOSIS — S92324A Nondisplaced fracture of second metatarsal bone, right foot, initial encounter for closed fracture: Secondary | ICD-10-CM | POA: Diagnosis not present

## 2016-10-28 DIAGNOSIS — S92334A Nondisplaced fracture of third metatarsal bone, right foot, initial encounter for closed fracture: Secondary | ICD-10-CM | POA: Diagnosis not present

## 2016-10-28 DIAGNOSIS — Z7982 Long term (current) use of aspirin: Secondary | ICD-10-CM | POA: Diagnosis not present

## 2016-10-28 DIAGNOSIS — M25562 Pain in left knee: Secondary | ICD-10-CM | POA: Diagnosis not present

## 2016-10-28 DIAGNOSIS — Z85118 Personal history of other malignant neoplasm of bronchus and lung: Secondary | ICD-10-CM | POA: Diagnosis not present

## 2016-10-28 DIAGNOSIS — S92501A Displaced unspecified fracture of right lesser toe(s), initial encounter for closed fracture: Secondary | ICD-10-CM

## 2016-10-28 DIAGNOSIS — Z79899 Other long term (current) drug therapy: Secondary | ICD-10-CM | POA: Insufficient documentation

## 2016-10-28 DIAGNOSIS — F17210 Nicotine dependence, cigarettes, uncomplicated: Secondary | ICD-10-CM | POA: Insufficient documentation

## 2016-10-28 DIAGNOSIS — M79671 Pain in right foot: Secondary | ICD-10-CM | POA: Insufficient documentation

## 2016-10-28 LAB — URINALYSIS, ROUTINE W REFLEX MICROSCOPIC
BILIRUBIN URINE: NEGATIVE
Glucose, UA: NEGATIVE mg/dL
Hgb urine dipstick: NEGATIVE
Ketones, ur: NEGATIVE mg/dL
NITRITE: NEGATIVE
PH: 5.5 (ref 5.0–8.0)
Protein, ur: 30 mg/dL — AB
SPECIFIC GRAVITY, URINE: 1.02 (ref 1.005–1.030)

## 2016-10-28 LAB — CBC WITH DIFFERENTIAL/PLATELET
BASOS ABS: 0.1 10*3/uL (ref 0.0–0.1)
BASOS PCT: 1 %
Eosinophils Absolute: 0.5 10*3/uL (ref 0.0–0.7)
Eosinophils Relative: 4 %
HEMATOCRIT: 33.7 % — AB (ref 36.0–46.0)
HEMOGLOBIN: 10.7 g/dL — AB (ref 12.0–15.0)
LYMPHS PCT: 9 %
Lymphs Abs: 1.2 10*3/uL (ref 0.7–4.0)
MCH: 31.3 pg (ref 26.0–34.0)
MCHC: 31.8 g/dL (ref 30.0–36.0)
MCV: 98.5 fL (ref 78.0–100.0)
MONOS PCT: 9 %
Monocytes Absolute: 1.2 10*3/uL — ABNORMAL HIGH (ref 0.1–1.0)
NEUTROS ABS: 10.5 10*3/uL — AB (ref 1.7–7.7)
NEUTROS PCT: 77 %
Platelets: 393 10*3/uL (ref 150–400)
RBC: 3.42 MIL/uL — ABNORMAL LOW (ref 3.87–5.11)
RDW: 14.1 % (ref 11.5–15.5)
WBC: 13.5 10*3/uL — ABNORMAL HIGH (ref 4.0–10.5)

## 2016-10-28 LAB — COMPREHENSIVE METABOLIC PANEL
ALT: 12 U/L — ABNORMAL LOW (ref 14–54)
ANION GAP: 9 (ref 5–15)
AST: 18 U/L (ref 15–41)
Albumin: 3.4 g/dL — ABNORMAL LOW (ref 3.5–5.0)
Alkaline Phosphatase: 78 U/L (ref 38–126)
BUN: 33 mg/dL — ABNORMAL HIGH (ref 6–20)
CHLORIDE: 102 mmol/L (ref 101–111)
CO2: 26 mmol/L (ref 22–32)
Calcium: 9.9 mg/dL (ref 8.9–10.3)
Creatinine, Ser: 1.83 mg/dL — ABNORMAL HIGH (ref 0.44–1.00)
GFR, EST AFRICAN AMERICAN: 29 mL/min — AB (ref >60)
GFR, EST NON AFRICAN AMERICAN: 25 mL/min — AB (ref >60)
Glucose, Bld: 142 mg/dL — ABNORMAL HIGH (ref 65–99)
POTASSIUM: 4.1 mmol/L (ref 3.5–5.1)
SODIUM: 137 mmol/L (ref 135–145)
Total Bilirubin: 0.2 mg/dL — ABNORMAL LOW (ref 0.3–1.2)
Total Protein: 6.9 g/dL (ref 6.5–8.1)

## 2016-10-28 LAB — URINALYSIS, MICROSCOPIC (REFLEX)

## 2016-10-28 NOTE — Progress Notes (Addendum)
Office Visit Note   Patient: Marissa Cruz           Date of Birth: Jul 15, 1934           MRN: [id number] Visit Date: 10/28/2016              Requested by: [address] [place], [address] [place], [place] [address] PCP: Carollee Herter, Alferd Apa, DO   Assessment & Plan: Visit Diagnoses:  1. Acute pain of left knee   2. Right foot pain   3. Nondisplaced fracture of second metatarsal bone, right foot, initial encounter for closed fracture   4. Closed nondisplaced fracture of third metatarsal bone of right foot, initial encounter   5. Closed nondisplaced fracture of fourth metatarsal bone of right foot, initial encounter   6. Closed nondisplaced fracture of fifth metatarsal bone of right foot, initial encounter   7. Closed fracture of phalanx of right second toe, initial encounter     Plan: From orthopedic standpoint she is actually doing quite well and her fractures should be quite amenable to closed treatment. The main issue is home safety. She is not able to live independently and her family cannot provide her with enough assistance or supervision at home. Therefore I asked them to go to the ER so that they can either be admitted or be transferred to a skilled nursing facility. I'll see her back in 6 weeks with repeat 3 view x-rays of the right foot  Follow-Up Instructions: Return in about 6 weeks (around 12/09/2016).   Orders:  Orders Placed This Encounter  Procedures  . XR KNEE 3 VIEW LEFT   No orders of the defined types were placed in this encounter.     Procedures: No procedures performed   Clinical Data: No additional findings.   Subjective: Chief Complaint  Patient presents with  . Right Foot - Pain  . Left Knee - Pain    Patient is a 82 year old female who sustained multiple foot fractures on 10/20/2016. She had a mechanical fall at home. She was evaluated in the ER and sent home. She comes in today for follow-up. She is also having  significant difficulty with ADLs. She does not feel safe at home nor does her family feel comfortable taking care of her. She has essentially been in a wheelchair for the last 10 years. She is able to stand to transfer. Denies any numbness and tingling    Review of Systems  Constitutional: Negative.   HENT: Negative.   Eyes: Negative.   Respiratory: Negative.   Cardiovascular: Negative.   Endocrine: Negative.   Musculoskeletal: Negative.   Neurological: Negative.   Hematological: Negative.   Psychiatric/Behavioral: Negative.   All other systems reviewed and are negative.    Objective: Vital Signs: There were no vitals taken for this visit.  Physical Exam  Constitutional: She is oriented to person, place, and time. She appears well-developed and well-nourished.  HENT:  Head: Normocephalic and atraumatic.  Eyes: EOM are normal.  Neck: Neck supple.  Pulmonary/Chest: Effort normal.  Abdominal: Soft.  Neurological: She is alert and oriented to person, place, and time.  Skin: Skin is warm. Capillary refill takes less than 2 seconds.  Psychiatric: She has a normal mood and affect. Her behavior is normal. Judgment and thought content normal.  Nursing note and vitals reviewed.   Ortho Exam Right foot exam shows minimal swelling. There is no open wounds. Foot is neurovascularly intact. Specialty Comments:  No specialty comments available.  Imaging: No results found.   PMFS History: Patient Active Problem List   Diagnosis Date Noted  . Nondisplaced fracture of second metatarsal bone, right foot, initial encounter for closed fracture 10/30/2016  . Closed nondisplaced fracture of third metatarsal bone of right foot 10/30/2016  . Closed nondisplaced fracture of fourth metatarsal bone of right foot 10/30/2016  . Closed nondisplaced fracture of fifth right metatarsal bone 10/30/2016  . Closed fracture of second toe of right foot 10/30/2016  . Acute pain of left knee 10/28/2016    . Right foot pain 10/28/2016  . Hematochezia   . Angiodysplasia of colon   . Acute blood loss anemia   . GI bleed 07/13/2016  . Rectal bleeding 07/13/2016  . Diarrhea 07/13/2016  . Iron deficiency anemia due to chronic blood loss 06/06/2016  . 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 Goals of care, counseling/discussion 03/07/2016  . Malignant neoplasm of lower lobe of right lung ([place]) 02/08/2016  . Lung mass 12/28/2015  . Protein-calorie malnutrition ([place]) 08/20/2014  . Pressure ulcer 08/20/2014  . CAP (community acquired pneumonia) 08/19/2014  . Tobacco use disorder 08/19/2014  . CKD (chronic kidney disease) stage 4, GFR 15-29 ml/min (HCC) 08/19/2014  . Occult blood in stools 01/31/2014  . Anemia 01/31/2014  . Benign neoplasm of transverse colon 01/31/2014  . Hematuria 01/07/2014  . Leukocytes in urine 01/07/2014  . Acute bronchitis 01/07/2014  . Back pain 01/07/2014  . 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 HTN (hypertension) 10/21/2013  . Generalized anxiety disorder 10/21/2013  . Gout 10/21/2013  . GERD (gastroesophageal reflux disease) 10/21/2013   Past Medical History:  Diagnosis Date  . Age-related macular degeneration, dry, right eye   . Age-related macular degeneration, wet, left eye ([place])   . Anemia   . Anxiety   . Arthritis    "thumbs, hands" (08/18/2014)  . CAP (community acquired pneumonia) 08/19/2014  . Carotid artery disease ([place])   . Chicken pox   . Chronic lower back pain   . Colon polyp   . Depression   . Diverticulitis   . GERD (gastroesophageal reflux disease)   . Goals of care, counseling/discussion 03/07/2016  . History of blood transfusion 1976; 2013   "w/hysterectomy; hip OR"  . History of gout   . Hypertension   . Iron deficiency anemia due to chronic blood loss 06/06/2016  . 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 Kidney disease   . 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 TIA (transient ischemic attack) ~ 2009 X 2  . Vocal cord  cancer ([place]) ~ 2009   S/P radiation    Family History  Problem Relation Age of Onset  . Hypertension Father   . Skin cancer Father   . Emphysema Paternal Grandfather   . Liver cancer Mother   . Stomach cancer Maternal Grandfather     Past Surgical History:  Procedure Laterality Date  . ABDOMINAL HYSTERECTOMY  1976  . APPENDECTOMY  1954  . CAROTID ENDARTERECTOMY Left ~ 2014  . CATARACT EXTRACTION W/ INTRAOCULAR LENS  IMPLANT, BILATERAL Bilateral ~ 2009  . COLONOSCOPY N/A 01/31/2014   Procedure: COLONOSCOPY;  Surgeon: Ladene Artist, MD;  Location: [facility];  Service: Endoscopy;  Laterality: N/A;  . COLONOSCOPY N/A 07/15/2016   Procedure: COLONOSCOPY;  Surgeon: Milus Banister, MD;  Location: [facility];  Service: Endoscopy;  Laterality: N/A;  . DILATION AND CURETTAGE OF UTERUS    . IR GENERIC  HISTORICAL  03/12/2016   IR US GUIDE VASC ACCESS RIGHT 03/12/2016 Jacqulynn Cadet, MD [facility]  . IR GENERIC HISTORICAL  03/12/2016   IR FLUORO GUIDE PORT INSERTION RIGHT 03/12/2016 Jacqulynn Cadet, MD [facility]  . JOINT REPLACEMENT    . MOUTH FLOOR MASS EXCISION  03/2014   "removed cancer underneath my tongue"  . TONSILLECTOMY  1941  . TOTAL HIP ARTHROPLASTY Left 2013   Social History   Occupational History  . Retired    Social History Main Topics  . Smoking status: Light Tobacco Smoker    Packs/day: 1.00    Years: 60.00    Types: Cigarettes    Last attempt to quit: 05/15/2016  . Smokeless tobacco: Never Used     Comment: states "one cigarette a week"  . Alcohol use 4.2 oz/week    7 Shots of liquor per week     Comment:  " shots brandy q night"  . Drug use: No  . Sexual activity: Not Currently

## 2016-10-28 NOTE — ED Notes (Signed)
Nurse rounding done on pt and family, warm blanket provided to patient

## 2016-10-28 NOTE — ED Provider Notes (Signed)
[facility] DEPT [facility] Provider Note   CSN: [id number] Arrival date & time: 10/28/16  1656     History   Chief Complaint Chief Complaint  Patient presents with  . Foot Pain    HPI Marissa Cruz is a 82 y.o. female.  82 yo F with a chief complaint of failure to thrive. The patient has had difficulty getting around since she broke multiple bones in her foot over week ago. Her family has been trying to take care of her at home but is unable to get her up and move around. She has been eating and drinking less since this. Saw her orthopedic physician today who was concerned that she may need placement in a skilled nursing facility. He tried to direct admit her but was unable to center to the ED. They deny any cough congestion or fevers. Denies any abdominal pain vomiting or diarrhea. Denies any skin breakdown.   The history is provided by the patient.  Foot Pain  Pertinent negatives include no chest pain, no headaches and no shortness of breath.  Illness  This is a new problem. The current episode started more than 1 week ago. The problem occurs constantly. The problem has been gradually worsening. Pertinent negatives include no chest pain, no headaches and no shortness of breath. The symptoms are aggravated by walking. Nothing relieves the symptoms. She has tried nothing for the symptoms. The treatment provided no relief.    Past Medical History:  Diagnosis Date  . Age-related macular degeneration, dry, right eye   . Age-related macular degeneration, wet, left eye ([place])   . Anemia   . Anxiety   . Arthritis    "thumbs, hands" (08/18/2014)  . CAP (community acquired pneumonia) 08/19/2014  . Carotid artery disease ([place])   . Chicken pox   . Chronic lower back pain   . Colon polyp   . Depression   . Diverticulitis   . GERD (gastroesophageal reflux disease)   . Goals of care, counseling/discussion 03/07/2016  . History of blood transfusion 1976; 2013   "w/hysterectomy; hip OR"  .  History of gout   . Hypertension   . Iron deficiency anemia due to chronic blood loss 06/06/2016  . 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 Kidney disease   . 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 TIA (transient ischemic attack) ~ 2009 X 2  . Vocal cord cancer ([place]) ~ 2009   S/P radiation    Patient Active Problem List   Diagnosis Date Noted  . Acute pain of left knee 10/28/2016  . Right foot pain 10/28/2016  . Hematochezia   . Angiodysplasia of colon   . Acute blood loss anemia   . GI bleed 07/13/2016  . Rectal bleeding 07/13/2016  . Diarrhea 07/13/2016  . Iron deficiency anemia due to chronic blood loss 06/06/2016  . 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 Goals of care, counseling/discussion 03/07/2016  . Malignant neoplasm of lower lobe of right lung ([place]) 02/08/2016  . Lung mass 12/28/2015  . Protein-calorie malnutrition ([place]) 08/20/2014  . Pressure ulcer 08/20/2014  . CAP (community acquired pneumonia) 08/19/2014  . Tobacco use disorder 08/19/2014  . CKD (chronic kidney disease) stage 4, GFR 15-29 ml/min (HCC) 08/19/2014  . Occult blood in stools 01/31/2014  . Anemia 01/31/2014  . Benign neoplasm of transverse colon 01/31/2014  . Hematuria 01/07/2014  . Leukocytes in urine 01/07/2014  . Acute bronchitis 01/07/2014  . Back pain 01/07/2014  . Perthe's disease  of hip 10/21/2013  . HTN (hypertension) 10/21/2013  . Generalized anxiety disorder 10/21/2013  . Gout 10/21/2013  . GERD (gastroesophageal reflux disease) 10/21/2013    Past Surgical History:  Procedure Laterality Date  . ABDOMINAL HYSTERECTOMY  1976  . APPENDECTOMY  1954  . CAROTID ENDARTERECTOMY Left ~ 2014  . CATARACT EXTRACTION W/ INTRAOCULAR LENS  IMPLANT, BILATERAL Bilateral ~ 2009  . COLONOSCOPY N/A 01/31/2014   Procedure: COLONOSCOPY;  Surgeon: Ladene Artist, MD;  [facility];  Service: Endoscopy;  Laterality: N/A;  . COLONOSCOPY N/A 07/15/2016   Procedure: COLONOSCOPY;   Surgeon: Milus Banister, MD;  [facility];  Service: Endoscopy;  Laterality: N/A;  . DILATION AND CURETTAGE OF UTERUS    . IR GENERIC HISTORICAL  03/12/2016   IR US GUIDE VASC ACCESS RIGHT 03/12/2016 Jacqulynn Cadet, MD [facility]  . IR GENERIC HISTORICAL  03/12/2016   IR FLUORO GUIDE PORT INSERTION RIGHT 03/12/2016 Jacqulynn Cadet, MD [facility]  . JOINT REPLACEMENT    . MOUTH FLOOR MASS EXCISION  03/2014   "removed cancer underneath my tongue"  . TONSILLECTOMY  1941  . TOTAL HIP ARTHROPLASTY Left 2013    OB History    No data available       Home Medications    Prior to Admission medications   Medication Sig Start Date End Date Taking? Authorizing Provider  allopurinol (ZYLOPRIM) 100 MG tablet TAKE 1 TABLET BY MOUTH AT BEDTIME 09/24/16   Carollee Herter, Alferd Apa, DO  ALPRAZolam (XANAX) 0.25 MG tablet Take 1 tablet (0.25 mg total) by mouth 3 (three) times daily as needed for anxiety. 09/26/16   Volanda Napoleon, MD  amLODipine (NORVASC) 5 MG tablet TAKE 1 TABLET BY MOUTH DAILY FOR HIGH BLOOD PRESSURE 08/20/16   Carollee Herter, Alferd Apa, DO  aspirin 81 MG tablet Take 81 mg by mouth daily.    [provider]  cephALEXin (KEFLEX) 500 MG capsule Take 1 capsule (500 mg total) by mouth 2 (two) times daily. 10/21/16   Malvin Johns, MD  cetirizine (ZYRTEC) 10 MG tablet Take 10 mg by mouth at bedtime.    [provider]  docusate sodium (COLACE) 100 MG capsule Take 100 mg by mouth 2 (two) times daily.     [provider]  estrogens, conjugated, ([name]) 0.625 MG tablet Take 1 tablet by mouth every day for 21 days, then do not take for 7 days. 08/29/16   Roma Schanz R, DO  ferrous sulfate 325 (65 FE) MG EC tablet Take 325 mg by mouth 2 (two) times daily.    [provider]  lidocaine-prilocaine (EMLA) cream Apply to affected area once 03/13/16   Ennever, Rudell Cobb, MD  LORazepam (ATIVAN) 0.5 MG tablet Take 1 tablet (0.5 mg total) by mouth every 6  (six) hours as needed (Nausea or vomiting). 03/13/16   Volanda Napoleon, MD  megestrol (MEGACE) 40 MG/ML suspension TAKE 10 ML BY MOUTH DAILY 09/25/16   Volanda Napoleon, MD  metoprolol succinate (TOPROL-XL) 25 MG 24 hr tablet TAKE 1 TABLET(25 MG) BY MOUTH EVERY MORNING 08/20/16   Carollee Herter, Kendrick Fries R, DO  mirtazapine (REMERON SOL-TAB) 15 MG disintegrating tablet DISSOLVE 1 TABLET ON THE TONGUE AT BEDTIME 09/09/16   Mosie Lukes, MD  Multiple Vitamins-Minerals (HM MULTIVITAMIN ADULT GUMMY) CHEW Chew 2 tablets by mouth every morning.    [provider]  nicotine (NICODERM CQ - DOSED IN MG/24 HOURS) 14 mg/24hr patch Place 14  mg onto the skin daily.    [provider]  omeprazole (PRILOSEC) 20 MG capsule TAKE 1 CAPSULE BY MOUTH TWICE DAILY BEFORE A MEAL 10/14/16   Carollee Herter, Yvonne R, DO  ondansetron (ZOFRAN-ODT) 8 MG disintegrating tablet Take 1 tablet (8 mg total) by mouth every 8 (eight) hours as needed for nausea or vomiting. Reported on 05/17/2015 03/05/16   Roma Schanz R, DO  PARoxetine (PAXIL) 10 MG tablet TAKE 1 TABLET BY MOUTH DAILY FOR DEPRESSION 08/20/16   Carollee Herter, Alferd Apa, DO  traMADol (ULTRAM) 50 MG tablet Take 1 tablet (50 mg total) by mouth every 6 (six) hours as needed. 09/26/16   Volanda Napoleon, MD  traZODone (DESYREL) 50 MG tablet TAKE 1 TABLET(50 MG) BY MOUTH AT BEDTIME 09/12/16   Ann Held, DO    Family History Family History  Problem Relation Age of Onset  . Hypertension Father   . Skin cancer Father   . Emphysema Paternal Grandfather   . Liver cancer Mother   . Stomach cancer Maternal Grandfather     Social History Social History  Substance Use Topics  . Smoking status: Light Tobacco Smoker    Packs/day: 1.00    Years: 60.00    Types: Cigarettes    Last attempt to quit: 05/15/2016  . Smokeless tobacco: Never Used     Comment: states "one cigarette a week"  . Alcohol use 4.2 oz/week    7 Shots of liquor per week     Comment:   " shots brandy q night"     Allergies   Morphine and related and Codeine   Review of Systems Review of Systems  Constitutional: Positive for activity change. Negative for chills and fever.  HENT: Negative for congestion and rhinorrhea.   Eyes: Negative for redness and visual disturbance.  Respiratory: Negative for shortness of breath and wheezing.   Cardiovascular: Negative for chest pain and palpitations.  Gastrointestinal: Negative for nausea and vomiting.  Genitourinary: Negative for dysuria and urgency.  Musculoskeletal: Positive for arthralgias and myalgias.  Skin: Negative for pallor and wound.  Neurological: Negative for dizziness and headaches.     Physical Exam Updated Vital Signs BP (!) 146/67   Pulse 92   Temp 98.9 F (37.2 C) (Oral)   Resp 18   Ht 5\' 7"  (1.702 m)   Wt 40.8 kg (90 lb)   SpO2 94%   BMI 14.10 kg/m   Physical Exam  Constitutional: She is oriented to person, place, and time. She appears cachectic. No distress.  HENT:  Head: Normocephalic and atraumatic.  Eyes: Pupils are equal, round, and reactive to light. EOM are normal.  Neck: Normal range of motion. Neck supple.  Cardiovascular: Normal rate and regular rhythm.  Exam reveals no gallop and no friction rub.   No murmur heard. Pulmonary/Chest: Effort normal. She has no wheezes. She has no rales.  Abdominal: Soft. She exhibits no distension and no mass. There is no tenderness. There is no guarding.  Musculoskeletal: She exhibits tenderness. She exhibits no edema.  Mild tenderness about the right foot, no noted skin breakdown.  Small skin tear above her cam walker.    Neurological: She is alert and oriented to person, place, and time.  Skin: Skin is warm and dry. She is not diaphoretic.  Psychiatric: She has a normal mood and affect. Her behavior is normal.  Nursing note and vitals reviewed.    ED Treatments / Results  Labs (all labs  ordered are listed, but only abnormal results are  displayed) Labs Reviewed  CBC WITH DIFFERENTIAL/PLATELET - Abnormal; Notable for the following:       Result Value   WBC 13.5 (*)    RBC 3.42 (*)    Hemoglobin 10.7 (*)    HCT 33.7 (*)    Neutro Abs 10.5 (*)    Monocytes Absolute 1.2 (*)    All other components within normal limits  COMPREHENSIVE METABOLIC PANEL - Abnormal; Notable for the following:    Glucose, Bld 142 (*)    BUN 33 (*)    Creatinine, Ser 1.83 (*)    Albumin 3.4 (*)    ALT 12 (*)    Total Bilirubin 0.2 (*)    GFR calc non Af Amer 25 (*)    GFR calc Af Amer 29 (*)    All other components within normal limits  URINALYSIS, ROUTINE W REFLEX MICROSCOPIC - Abnormal; Notable for the following:    Protein, ur 30 (*)    Leukocytes, UA SMALL (*)    All other components within normal limits  URINALYSIS, MICROSCOPIC (REFLEX) - Abnormal; Notable for the following:    Bacteria, UA MANY (*)    Squamous Epithelial / LPF 6-30 (*)    All other components within normal limits  CALCIUM, IONIZED  CALCIUM, IONIZED    EKG  EKG Interpretation None       Radiology Dg Chest 2 View  Result Date: 10/28/2016 CLINICAL DATA:  Failure to thrive, sore throat and weakness x1 week. EXAM: CHEST  2 VIEW COMPARISON:  None. FINDINGS: The heart size and mediastinal contours are within normal limits. Port catheter tip is seen in the distal SVC. Lungs are hyperinflated, stable in appearance. No pneumonic consolidation, CHF, effusion or pneumothorax. Minimal atelectasis at the left lung base. Mild osteopenia of the dorsal spine with minimal degenerative disc disease and endplate spurring. IMPRESSION: No active cardiopulmonary disease. Electronically Signed   By: Ashley Royalty M.D.   On: 10/28/2016 19:09   Xr Knee 3 View Left  Result Date: 10/28/2016 Chondrocalcinosis. No acute changes   Procedures Procedures (including critical care time)  Medications Ordered in ED Medications - No data to display   Initial Impression / Assessment and  Plan / ED Course  I have reviewed the triage vital signs and the nursing notes.  Pertinent labs & imaging results that were available during my care of the patient were reviewed by me and considered in my medical decision making (see chart for details).     82 yo F With a chief complaint of failure thrive. Going on since she broke some bones in her foot. Family is here to have her placed in the hospital. I discussed with them the limitation of our ability to do this. Will obtain a workup to evaluate for possible pathology. Consult to social work and case management.  Workup here is not consistent with anything that I think would her in the hospital. She has a contaminated urine specimen. Chest x-ray negative. Lab work otherwise unremarkable. Will discharge the patient home. Home health orders were placed.  9:59 PM:  I have discussed the diagnosis/risks/treatment options with the patient and family and believe the pt to be eligible for discharge home to follow-up with PCP. We also discussed returning to the ED immediately if new or worsening sx occur. We discussed the sx which are most concerning (e.g., sudden worsening pain, fever, inability to tolerate by mouth) that necessitate immediate return. Medications  administered to the patient during their visit and any new prescriptions provided to the patient are listed below.  Medications given during this visit Medications - No data to display   The patient appears reasonably screen and/or stabilized for discharge and I doubt any other medical condition or other [facility] requiring further screening, evaluation, or treatment in the ED at this time prior to discharge.    Final Clinical Impressions(s) / ED Diagnoses   Final diagnoses:  Failure to thrive in adult    New Prescriptions New Prescriptions   No medications on file     Deno Etienne, DO 10/28/16 2159

## 2016-10-28 NOTE — ED Notes (Addendum)
Pt reports hurting her foot 1 week ago and being seen here. Pt reports going to the Orthopedic doctor today and they sent here back to the ED for pain and further management. Family states the ortho doc said he could not admit the pt from his office or get any social work help.

## 2016-10-28 NOTE — ED Notes (Signed)
Marissa Cruz with Care Management and she advised they will contact the family tomorrow.

## 2016-10-28 NOTE — Discharge Instructions (Signed)
Follow up with your PCP.  A social worker should get in touch with you tomorrow.

## 2016-10-28 NOTE — ED Triage Notes (Signed)
She has a broken left foot as a result of a fall last week. she is here today for possible nursing home placement of admission because her family cannot care for her at home.

## 2016-10-30 DIAGNOSIS — S92501A Displaced unspecified fracture of right lesser toe(s), initial encounter for closed fracture: Secondary | ICD-10-CM | POA: Insufficient documentation

## 2016-10-30 DIAGNOSIS — S92334A Nondisplaced fracture of third metatarsal bone, right foot, initial encounter for closed fracture: Secondary | ICD-10-CM | POA: Insufficient documentation

## 2016-10-30 DIAGNOSIS — S92324A Nondisplaced fracture of second metatarsal bone, right foot, initial encounter for closed fracture: Secondary | ICD-10-CM | POA: Insufficient documentation

## 2016-10-30 DIAGNOSIS — S92354A Nondisplaced fracture of fifth metatarsal bone, right foot, initial encounter for closed fracture: Secondary | ICD-10-CM | POA: Insufficient documentation

## 2016-10-30 DIAGNOSIS — S92344A Nondisplaced fracture of fourth metatarsal bone, right foot, initial encounter for closed fracture: Secondary | ICD-10-CM | POA: Insufficient documentation

## 2016-10-30 LAB — CALCIUM, IONIZED: Calcium, Ionized, Serum: 5.5 mg/dL (ref 4.5–5.6)

## 2016-10-31 ENCOUNTER — Encounter: Payer: Self-pay | Admitting: Family Medicine

## 2016-11-06 ENCOUNTER — Other Ambulatory Visit: Payer: Self-pay | Admitting: Family Medicine

## 2016-11-07 ENCOUNTER — Other Ambulatory Visit: Payer: Self-pay

## 2016-11-07 MED ORDER — OMEPRAZOLE 20 MG PO CPDR: DELAYED_RELEASE_CAPSULE | ORAL | 2 refills | Status: DC

## 2016-11-10 ENCOUNTER — Other Ambulatory Visit: Payer: Self-pay | Admitting: Hematology & Oncology

## 2016-11-10 DIAGNOSIS — C3431 Malignant neoplasm of lower lobe, right bronchus or lung: Secondary | ICD-10-CM

## 2016-11-12 ENCOUNTER — Other Ambulatory Visit: Payer: Self-pay | Admitting: Family Medicine

## 2016-11-12 NOTE — Telephone Encounter (Signed)
Will need an appt in Nov/thx dmf

## 2016-11-12 NOTE — Telephone Encounter (Signed)
Refill both through nov-- paxil and norvasc

## 2016-11-12 NOTE — Telephone Encounter (Signed)
Please advise on refill.

## 2016-11-13 NOTE — Telephone Encounter (Signed)
Spoke with daughter and she is aware pt is in need of an appt in Nov. She wanted to call us back to schedule this. She had no additional questions at this time. Nothing further is needed

## 2016-11-14 ENCOUNTER — Ambulatory Visit: Payer: Medicare Other

## 2016-11-14 ENCOUNTER — Ambulatory Visit (HOSPITAL_BASED_OUTPATIENT_CLINIC_OR_DEPARTMENT_OTHER): Payer: Medicare Other

## 2016-11-14 ENCOUNTER — Other Ambulatory Visit (HOSPITAL_BASED_OUTPATIENT_CLINIC_OR_DEPARTMENT_OTHER): Payer: Medicare Other

## 2016-11-14 ENCOUNTER — Ambulatory Visit (HOSPITAL_BASED_OUTPATIENT_CLINIC_OR_DEPARTMENT_OTHER): Payer: Medicare Other | Admitting: Hematology & Oncology

## 2016-11-14 VITALS — HR 110

## 2016-11-14 DIAGNOSIS — D500 Iron deficiency anemia secondary to blood loss (chronic): Secondary | ICD-10-CM

## 2016-11-14 DIAGNOSIS — R918 Other nonspecific abnormal finding of lung field: Secondary | ICD-10-CM

## 2016-11-14 DIAGNOSIS — Z5112 Encounter for antineoplastic immunotherapy: Secondary | ICD-10-CM | POA: Diagnosis present

## 2016-11-14 DIAGNOSIS — C3431 Malignant neoplasm of lower lobe, right bronchus or lung: Secondary | ICD-10-CM

## 2016-11-14 LAB — CBC WITH DIFFERENTIAL (CANCER CENTER ONLY)
BASO#: 0.1 10*3/uL (ref 0.0–0.2)
BASO%: 1.2 % (ref 0.0–2.0)
EOS%: 4.1 % (ref 0.0–7.0)
Eosinophils Absolute: 0.5 10*3/uL (ref 0.0–0.5)
HCT: 33.6 % — ABNORMAL LOW (ref 34.8–46.6)
HEMOGLOBIN: 10.8 g/dL — AB (ref 11.6–15.9)
LYMPH#: 1.8 10*3/uL (ref 0.9–3.3)
LYMPH%: 15.6 % (ref 14.0–48.0)
MCH: 31.9 pg (ref 26.0–34.0)
MCHC: 32.1 g/dL (ref 32.0–36.0)
MCV: 99 fL (ref 81–101)
MONO#: 1 10*3/uL — AB (ref 0.1–0.9)
MONO%: 9.1 % (ref 0.0–13.0)
NEUT#: 7.9 10*3/uL — ABNORMAL HIGH (ref 1.5–6.5)
NEUT%: 70 % (ref 39.6–80.0)
PLATELETS: 427 10*3/uL — AB (ref 145–400)
RBC: 3.39 10*6/uL — AB (ref 3.70–5.32)
RDW: 14.1 % (ref 11.1–15.7)
WBC: 11.3 10*3/uL — AB (ref 3.9–10.0)

## 2016-11-14 LAB — CMP (CANCER CENTER ONLY)
ALBUMIN: 3.2 g/dL — AB (ref 3.3–5.5)
ALK PHOS: 96 U/L — AB (ref 26–84)
ALT(SGPT): 16 U/L (ref 10–47)
AST: 20 U/L (ref 11–38)
BILIRUBIN TOTAL: 0.5 mg/dL (ref 0.20–1.60)
BUN: 26 mg/dL — AB (ref 7–22)
CHLORIDE: 108 meq/L (ref 98–108)
CO2: 26 mEq/L (ref 18–33)
CREATININE: 1.5 mg/dL — AB (ref 0.6–1.2)
Calcium: 8.7 mg/dL (ref 8.0–10.3)
Glucose, Bld: 113 mg/dL (ref 73–118)
POTASSIUM: 4 meq/L (ref 3.3–4.7)
Sodium: 141 mEq/L (ref 128–145)
TOTAL PROTEIN: 6.7 g/dL (ref 6.4–8.1)

## 2016-11-14 MED ORDER — HEPARIN SOD (PORK) LOCK FLUSH 100 UNIT/ML IV SOLN
500.0000 [IU] | Freq: Once | INTRAVENOUS | Status: AC | PRN
  Administered 2016-11-14: 500 [IU]
  Filled 2016-11-14: qty 5

## 2016-11-14 MED ORDER — SODIUM CHLORIDE 0.9 % IV SOLN
Freq: Once | INTRAVENOUS | Status: AC
  Administered 2016-11-14: 15:00:00 via INTRAVENOUS

## 2016-11-14 MED ORDER — SODIUM CHLORIDE 0.9 % IV SOLN
200.0000 mg | Freq: Once | INTRAVENOUS | Status: AC
  Administered 2016-11-14: 200 mg via INTRAVENOUS
  Filled 2016-11-14: qty 8

## 2016-11-14 MED ORDER — SODIUM CHLORIDE 0.9% FLUSH
10.0000 mL | INTRAVENOUS | Status: DC | PRN
  Administered 2016-11-14: 10 mL
  Filled 2016-11-14: qty 10

## 2016-11-14 MED ORDER — OXANDROLONE 10 MG PO TABS: 10.0000 mg | ORAL_TABLET | Freq: Every day | ORAL | 2 refills | Status: AC

## 2016-11-14 NOTE — Patient Instructions (Signed)
Pembrolizumab injection  What is this medicine?  PEMBROLIZUMAB (pem broe liz ue mab) is a monoclonal antibody. It is used to treat melanoma, head and neck cancer, Hodgkin lymphoma, non-small cell lung cancer, urothelial cancer, stomach cancer, and cancers that have a certain genetic condition.  This medicine may be used for other purposes; ask your health care provider or pharmacist if you have questions.  COMMON BRAND NAME(S): Keytruda  What should I tell my health care provider before I take this medicine?  They need to know if you have any of these conditions:  -diabetes  -immune system problems  -inflammatory bowel disease  -liver disease  -lung or breathing disease  -lupus  -organ transplant  -an unusual or allergic reaction to pembrolizumab, other medicines, foods, dyes, or preservatives  -pregnant or trying to get pregnant  -breast-feeding  How should I use this medicine?  This medicine is for infusion into a vein. It is given by a health care professional in a hospital or clinic setting.  A special MedGuide will be given to you before each treatment. Be sure to read this information carefully each time.  Talk to your pediatrician regarding the use of this medicine in children. While this drug may be prescribed for selected conditions, precautions do apply.  Overdosage: If you think you have taken too much of this medicine contact a poison control center or emergency room at once.  NOTE: This medicine is only for you. Do not share this medicine with others.  What if I miss a dose?  It is important not to miss your dose. Call your doctor or health care professional if you are unable to keep an appointment.  What may interact with this medicine?  Interactions have not been studied.  Give your health care provider a list of all the medicines, herbs, non-prescription drugs, or dietary supplements you use. Also tell them if you smoke, drink alcohol, or use illegal drugs. Some items may interact with your  medicine.  This list may not describe all possible interactions. Give your health care provider a list of all the medicines, herbs, non-prescription drugs, or dietary supplements you use. Also tell them if you smoke, drink alcohol, or use illegal drugs. Some items may interact with your medicine.  What should I watch for while using this medicine?  Your condition will be monitored carefully while you are receiving this medicine.  You may need blood work done while you are taking this medicine.  Do not become pregnant while taking this medicine or for 4 months after stopping it. Women should inform their doctor if they wish to become pregnant or think they might be pregnant. There is a potential for serious side effects to an unborn child. Talk to your health care professional or pharmacist for more information. Do not breast-feed an infant while taking this medicine or for 4 months after the last dose.  What side effects may I notice from receiving this medicine?  Side effects that you should report to your doctor or health care professional as soon as possible:  -allergic reactions like skin rash, itching or hives, swelling of the face, lips, or tongue  -bloody or black, tarry  -breathing problems  -changes in vision  -chest pain  -chills  -constipation  -cough  -dizziness or feeling faint or lightheaded  -fast or irregular heartbeat  -fever  -flushing  -hair loss  -low blood counts - this medicine may decrease the number of white blood cells, red blood cells   and platelets. You may be at increased risk for infections and bleeding.  -muscle pain  -muscle weakness  -persistent headache  -signs and symptoms of high blood sugar such as dizziness; dry mouth; dry skin; fruity breath; nausea; stomach pain; increased hunger or thirst; increased urination  -signs and symptoms of kidney injury like trouble passing urine or change in the amount of urine  -signs and symptoms of liver injury like dark urine, light-colored  stools, loss of appetite, nausea, right upper belly pain, yellowing of the eyes or skin  -stomach pain  -sweating  -weight loss  Side effects that usually do not require medical attention (report to your doctor or health care professional if they continue or are bothersome):  -decreased appetite  -diarrhea  -tiredness  This list may not describe all possible side effects. Call your doctor for medical advice about side effects. You may report side effects to FDA at 1-800-FDA-1088.  Where should I keep my medicine?  This drug is given in a hospital or clinic and will not be stored at home.  NOTE: This sheet is a summary. It may not cover all possible information. If you have questions about this medicine, talk to your doctor, pharmacist, or health care provider.   2018 Elsevier/Gold Standard (2015-11-28 12:29:36)

## 2016-11-14 NOTE — Progress Notes (Signed)
Hematology and Oncology Follow Up Visit  Marissa Cruz [id number] 1934/10/27 82 y.o. 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 She is managing about as well as can be expected with the broken right foot. She still has a boot on. I think this is on for another 3 weeks.  The main issue, which has always been present, is that she just does not want to eat. She is a was been like this. She is on Megace.  She just does not want to eat. She does not throw up. She says that if she eats too much that she will get sick.  There's been no nausea or vomiting.  She's had no cough or shortness of breath.  She's had some intermittent diarrhea and constipation. She does take stool softeners when she gets a little constipated. When this happens, that she has some diarrhea.  I will try her on some Oxandrin to see if she can help gain weight this way. She will do 10 mg daily.   Overall, her performance status is ECOG 3.   Medications:  Allergies as of 11/14/2016      Reactions   Morphine And Related Other (See Comments)   Hallucinations and combative   Codeine Nausea Only      Medication List       Accurate as of 11/14/16  1:33 PM. Always use your most recent med list.          allopurinol 100 MG tablet Commonly known as:  ZYLOPRIM TAKE 1 TABLET BY MOUTH AT BEDTIME   ALPRAZolam 0.25 MG tablet Commonly known as:  XANAX Take 1 tablet (0.25 mg total) by mouth 3 (three) times daily as needed for anxiety.   amLODipine 5 MG tablet Commonly known as:  NORVASC TAKE 1 TABLET BY MOUTH DAILY FOR HIGH BLOOD PRESSURE   aspirin 81 MG tablet Take 81 mg by mouth daily.     cetirizine 10 MG tablet Commonly known as:  ZYRTEC Take 10 mg by mouth at bedtime.   docusate sodium 100 MG capsule Commonly known as:  COLACE Take 100 mg by mouth 2 (two) times daily.   estrogens (conjugated) 0.625 MG tablet Commonly known as:  [name] Take 1 tablet by mouth every day for 21 days, then do not take for 7 days.   ferrous sulfate 325 (65 FE) MG EC tablet Take 325 mg by mouth 2 (two) times daily.   HM MULTIVITAMIN ADULT GUMMY Chew Chew 2 tablets by mouth every morning.   lidocaine-prilocaine cream Commonly known as:  EMLA Apply to affected area once   LORazepam 0.5 MG tablet Commonly known as:  ATIVAN Take 1 tablet (0.5 mg total) by mouth every 6 (six) hours as needed (Nausea or vomiting).   megestrol 40 MG/ML suspension Commonly known as:  MEGACE TAKE 10 ML BY MOUTH DAILY   metoprolol succinate 25 MG 24 hr tablet Commonly known as:  TOPROL-XL TAKE 1 TABLET(25 MG) BY MOUTH EVERY MORNING   mirtazapine 15 MG disintegrating tablet Commonly known as:  REMERON SOL-TAB DISSOLVE 1 TABLET ON THE TONGUE AT BEDTIME   nicotine 14 mg/24hr patch Commonly known as:  NICODERM CQ -  dosed in mg/24 hours Place 14 mg onto the skin daily.   omeprazole 20 MG capsule Commonly known as:  PRILOSEC TAKE 1 CAPSULE BY MOUTH TWICE DAILY BEFORE A MEAL   ondansetron 8 MG disintegrating tablet Commonly known as:  ZOFRAN-ODT Take 1 tablet (8 mg total) by mouth every 8 (eight) hours as needed for nausea or vomiting. Reported on 05/17/2015   PARoxetine 10 MG tablet Commonly known as:  PAXIL TAKE 1 TABLET BY MOUTH DAILY FOR DEPRESSION   traMADol 50 MG tablet Commonly known as:  ULTRAM Take 1 tablet (50 mg total) by mouth every 6 (six) hours as needed.   traZODone 50 MG tablet Commonly known as:  DESYREL TAKE 1 TABLET(50 MG) BY MOUTH AT BEDTIME       Allergies:  Allergies  Allergen Reactions  . Morphine And Related Other (See Comments)    Hallucinations and combative   . Codeine Nausea Only    Past Medical History, Surgical history, Social history, and Family History were reviewed and updated.  Review of Systems: As stated in the interim history  Physical Exam:  pulse is 110 (abnormal).   Wt Readings from Last 3 Encounters:  11/14/16 95 lb (43.1 kg)  10/28/16 90 lb (40.8 kg)  10/21/16 95 lb (43.1 kg)   Physical Exam  Constitutional: She is oriented to person, place, and time.  HENT:  Head: Normocephalic and atraumatic.  Mouth/Throat: Oropharynx is clear and moist.  Eyes: Pupils are equal, round, and reactive to light. EOM are normal.  Neck: Normal range of motion.  Cardiovascular: Normal rate, regular rhythm and normal heart sounds.   Pulmonary/Chest: Effort normal and breath sounds normal.  Abdominal: Soft. Bowel sounds are normal.  Musculoskeletal: Normal range of motion. She exhibits no edema, tenderness or deformity.  She does have a walking boot on the right lower leg.  Lymphadenopathy:    She has no cervical adenopathy.  Neurological: She is alert and oriented to person, place, and time.  Skin: Skin is warm and dry. No rash noted. No erythema.  Psychiatric: She has a normal mood and affect. Her behavior is normal. Judgment and thought content normal.  Vitals reviewed.    Lab Results  Component Value Date   WBC 11.3 (H) 11/14/2016   HGB 10.8 (L) 11/14/2016   HCT 33.6 (L) 11/14/2016   MCV 99 11/14/2016   PLT 427 (H) 11/14/2016   Lab Results  Component Value Date   FERRITIN 277 (H) 10/24/2016   IRON 54 10/24/2016   TIBC 233 (L) 10/24/2016   UIBC 178 10/24/2016   IRONPCTSAT 23 10/24/2016   Lab Results  Component Value Date   RBC 3.39 (L) 11/14/2016   No results found for: KPAFRELGTCHN, LAMBDASER, KAPLAMBRATIO No results found for: IGGSERUM, IGA, IGMSERUM No results found for: Ronnald Ramp, A1GS, A2GS, Violet Baldy, MSPIKE, SPEI   Chemistry      Component Value Date/Time   NA 141 11/14/2016 1245    NA 136 03/07/2016 1055   K 4.0 11/14/2016 1245   K 4.1 03/07/2016 1055   CL 108 11/14/2016 1245   CO2 26 11/14/2016 1245   CO2 26 03/07/2016 1055   BUN 26 (H) 11/14/2016 1245   BUN 28.7 (H) 03/07/2016 1055   CREATININE 1.5 (H) 11/14/2016 1245   CREATININE 1.3 (H) 03/07/2016 1055      Component Value Date/Time   CALCIUM 8.7 11/14/2016 1245   CALCIUM 9.5 03/07/2016 1055   ALKPHOS 96 (H) 11/14/2016 1245  ALKPHOS 83 03/07/2016 1055   AST 20 11/14/2016 1245   AST 17 03/07/2016 1055   ALT 16 11/14/2016 1245   ALT 9 03/07/2016 1055   BILITOT 0.50 11/14/2016 1245   BILITOT 0.26 03/07/2016 1055      Impression and Plan: Ms. Charo is a 61 your old white female. She has locally advanced non-small cell lung cancer of the right lower lobe. She is on immunotherapy. This is the only therapy that she can tolerate. She seems to be holding her own.  We will go ahead with her next cycle of treatment. I probably will do one additional cycle and then we will rescan her with a PET scan. Her last PET scan was back in August.  I just hope that we can get her to eat better.  Thankfully, she is tolerating the pembrolizumab fairly well.   I would not think that she would need any iron. We do check her iron studies.   We will see her back in 3 weeks.  Volanda Napoleon, MD 9/13/20181:33 PM

## 2016-11-24 ENCOUNTER — Other Ambulatory Visit: Payer: Self-pay | Admitting: Family Medicine

## 2016-11-26 DIAGNOSIS — H353211 Exudative age-related macular degeneration, right eye, with active choroidal neovascularization: Secondary | ICD-10-CM | POA: Diagnosis not present

## 2016-12-02 ENCOUNTER — Other Ambulatory Visit: Payer: Self-pay | Admitting: Family Medicine

## 2016-12-05 ENCOUNTER — Ambulatory Visit: Payer: Medicare Other

## 2016-12-05 ENCOUNTER — Ambulatory Visit (HOSPITAL_BASED_OUTPATIENT_CLINIC_OR_DEPARTMENT_OTHER): Payer: Medicare Other

## 2016-12-05 ENCOUNTER — Ambulatory Visit (HOSPITAL_BASED_OUTPATIENT_CLINIC_OR_DEPARTMENT_OTHER): Payer: Medicare Other | Admitting: Hematology & Oncology

## 2016-12-05 ENCOUNTER — Other Ambulatory Visit (HOSPITAL_BASED_OUTPATIENT_CLINIC_OR_DEPARTMENT_OTHER): Payer: Medicare Other

## 2016-12-05 VITALS — BP 169/71 | HR 100 | Temp 98.2°F | Resp 18 | Wt 98.2 lb

## 2016-12-05 DIAGNOSIS — Z5112 Encounter for antineoplastic immunotherapy: Secondary | ICD-10-CM

## 2016-12-05 DIAGNOSIS — D509 Iron deficiency anemia, unspecified: Secondary | ICD-10-CM | POA: Diagnosis not present

## 2016-12-05 DIAGNOSIS — C3491 Malignant neoplasm of unspecified part of right bronchus or lung: Secondary | ICD-10-CM

## 2016-12-05 DIAGNOSIS — D500 Iron deficiency anemia secondary to blood loss (chronic): Secondary | ICD-10-CM

## 2016-12-05 DIAGNOSIS — C3490 Malignant neoplasm of unspecified part of unspecified bronchus or lung: Secondary | ICD-10-CM

## 2016-12-05 DIAGNOSIS — C3431 Malignant neoplasm of lower lobe, right bronchus or lung: Secondary | ICD-10-CM

## 2016-12-05 DIAGNOSIS — R918 Other nonspecific abnormal finding of lung field: Secondary | ICD-10-CM

## 2016-12-05 LAB — CMP (CANCER CENTER ONLY)
ALT(SGPT): 16 U/L (ref 10–47)
AST: 21 U/L (ref 11–38)
Albumin: 3 g/dL — ABNORMAL LOW (ref 3.3–5.5)
Alkaline Phosphatase: 61 U/L (ref 26–84)
BUN, Bld: 31 mg/dL — ABNORMAL HIGH (ref 7–22)
CHLORIDE: 107 meq/L (ref 98–108)
CO2: 26 meq/L (ref 18–33)
Calcium: 9.3 mg/dL (ref 8.0–10.3)
Creat: 1.7 mg/dl — ABNORMAL HIGH (ref 0.6–1.2)
GLUCOSE: 121 mg/dL — AB (ref 73–118)
POTASSIUM: 4.1 meq/L (ref 3.3–4.7)
SODIUM: 139 meq/L (ref 128–145)
Total Bilirubin: 0.4 mg/dl (ref 0.20–1.60)
Total Protein: 6.5 g/dL (ref 6.4–8.1)

## 2016-12-05 LAB — CBC WITH DIFFERENTIAL (CANCER CENTER ONLY)
BASO#: 0.1 10*3/uL (ref 0.0–0.2)
BASO%: 0.9 % (ref 0.0–2.0)
EOS%: 3 % (ref 0.0–7.0)
Eosinophils Absolute: 0.3 10*3/uL (ref 0.0–0.5)
HCT: 32.9 % — ABNORMAL LOW (ref 34.8–46.6)
HEMOGLOBIN: 10.6 g/dL — AB (ref 11.6–15.9)
LYMPH#: 1.7 10*3/uL (ref 0.9–3.3)
LYMPH%: 16.7 % (ref 14.0–48.0)
MCH: 31.9 pg (ref 26.0–34.0)
MCHC: 32.2 g/dL (ref 32.0–36.0)
MCV: 99 fL (ref 81–101)
MONO#: 1 10*3/uL — AB (ref 0.1–0.9)
MONO%: 9.8 % (ref 0.0–13.0)
NEUT%: 69.6 % (ref 39.6–80.0)
NEUTROS ABS: 7.1 10*3/uL — AB (ref 1.5–6.5)
PLATELETS: 415 10*3/uL — AB (ref 145–400)
RBC: 3.32 10*6/uL — AB (ref 3.70–5.32)
RDW: 14.8 % (ref 11.1–15.7)
WBC: 10.2 10*3/uL — AB (ref 3.9–10.0)

## 2016-12-05 LAB — IRON AND TIBC
%SAT: 32 % (ref 21–57)
IRON: 76 ug/dL (ref 41–142)
TIBC: 238 ug/dL (ref 236–444)
UIBC: 162 ug/dL (ref 120–384)

## 2016-12-05 LAB — FERRITIN: Ferritin: 253 ng/ml (ref 9–269)

## 2016-12-05 MED ORDER — HEPARIN SOD (PORK) LOCK FLUSH 100 UNIT/ML IV SOLN
500.0000 [IU] | Freq: Once | INTRAVENOUS | Status: AC | PRN
Start: 2016-12-05 — End: 2016-12-05
  Administered 2016-12-05: 500 [IU]
  Filled 2016-12-05: qty 5

## 2016-12-05 MED ORDER — SODIUM CHLORIDE 0.9% FLUSH
10.0000 mL | INTRAVENOUS | Status: DC | PRN
  Administered 2016-12-05: 10 mL via INTRAVENOUS
  Filled 2016-12-05: qty 10

## 2016-12-05 MED ORDER — SODIUM CHLORIDE 0.9% FLUSH
10.0000 mL | INTRAVENOUS | Status: DC | PRN
  Administered 2016-12-05: 10 mL
  Filled 2016-12-05: qty 10

## 2016-12-05 MED ORDER — SODIUM CHLORIDE 0.9 % IV SOLN
200.0000 mg | Freq: Once | INTRAVENOUS | Status: AC
  Administered 2016-12-05: 200 mg via INTRAVENOUS
  Filled 2016-12-05: qty 8

## 2016-12-05 MED ORDER — SODIUM CHLORIDE 0.9 % IV SOLN
Freq: Once | INTRAVENOUS | Status: AC
  Administered 2016-12-05: 12:00:00 via INTRAVENOUS

## 2016-12-05 NOTE — Patient Instructions (Signed)
[facility] Discharge Instructions for Patients Receiving Chemotherapy  Today you received the following chemotherapy agents :  Keytruda.  To help prevent nausea and vomiting after your treatment, we encourage you to take your nausea medication as prescribed.   If you develop nausea and vomiting that is not controlled by your nausea medication, call the clinic.   BELOW ARE SYMPTOMS THAT SHOULD BE REPORTED IMMEDIATELY:  *FEVER GREATER THAN 100.5 F  *CHILLS WITH OR WITHOUT FEVER  NAUSEA AND VOMITING THAT IS NOT CONTROLLED WITH YOUR NAUSEA MEDICATION  *UNUSUAL SHORTNESS OF BREATH  *UNUSUAL BRUISING OR BLEEDING  TENDERNESS IN MOUTH AND THROAT WITH OR WITHOUT PRESENCE OF ULCERS  *URINARY PROBLEMS  *BOWEL PROBLEMS  UNUSUAL RASH Items with * indicate a potential emergency and should be followed up as soon as possible.  Feel free to call the clinic should you have any questions or concerns. The clinic phone number is [phone].  Please show the CHEMO ALERT CARD at check-in to the Emergency Department and triage nurse.  

## 2016-12-05 NOTE — Addendum Note (Signed)
Addended by: Burney Gauze R on: 12/05/2016 12:12 PM   Modules accepted: Orders

## 2016-12-05 NOTE — Patient Instructions (Signed)
Implanted Port Home Guide An implanted port is a type of central line that is placed under the skin. Central lines are used to provide IV access when treatment or nutrition needs to be given through a person's veins. Implanted ports are used for long-term IV access. An implanted port may be placed because:  You need IV medicine that would be irritating to the small veins in your hands or arms.  You need long-term IV medicines, such as antibiotics.  You need IV nutrition for a long period.  You need frequent blood draws for lab tests.  You need dialysis.  Implanted ports are usually placed in the chest area, but they can also be placed in the upper arm, the abdomen, or the leg. An implanted port has two main parts:  Reservoir. The reservoir is round and will appear as a small, raised area under your skin. The reservoir is the part where a needle is inserted to give medicines or draw blood.  Catheter. The catheter is a thin, flexible tube that extends from the reservoir. The catheter is placed into a large vein. Medicine that is inserted into the reservoir goes into the catheter and then into the vein.  How will I care for my incision site? Do not get the incision site wet. Bathe or shower as directed by your health care provider. How is my port accessed? Special steps must be taken to access the port:  Before the port is accessed, a numbing cream can be placed on the skin. This helps numb the skin over the port site.  Your health care provider uses a sterile technique to access the port. ? Your health care provider must put on a mask and sterile gloves. ? The skin over your port is cleaned carefully with an antiseptic and allowed to dry. ? The port is gently pinched between sterile gloves, and a needle is inserted into the port.  Only "non-coring" port needles should be used to access the port. Once the port is accessed, a blood return should be checked. This helps ensure that the port  is in the vein and is not clogged.  If your port needs to remain accessed for a constant infusion, a clear (transparent) bandage will be placed over the needle site. The bandage and needle will need to be changed every week, or as directed by your health care provider.  Keep the bandage covering the needle clean and dry. Do not get it wet. Follow your health care provider's instructions on how to take a shower or bath while the port is accessed.  If your port does not need to stay accessed, no bandage is needed over the port.  What is flushing? Flushing helps keep the port from getting clogged. Follow your health care provider's instructions on how and when to flush the port. Ports are usually flushed with saline solution or a medicine called heparin. The need for flushing will depend on how the port is used.  If the port is used for intermittent medicines or blood draws, the port will need to be flushed: ? After medicines have been given. ? After blood has been drawn. ? As part of routine maintenance.  If a constant infusion is running, the port may not need to be flushed.  How long will my port stay implanted? The port can stay in for as long as your health care provider thinks it is needed. When it is time for the port to come out, surgery will be   done to remove it. The procedure is similar to the one performed when the port was put in. When should I seek immediate medical care? When you have an implanted port, you should seek immediate medical care if:  You notice a bad smell coming from the incision site.  You have swelling, redness, or drainage at the incision site.  You have more swelling or pain at the port site or the surrounding area.  You have a fever that is not controlled with medicine.  This information is not intended to replace advice given to you by your health care provider. Make sure you discuss any questions you have with your health care provider. Document  Released: 02/18/2005 Document Revised: 07/27/2015 Document Reviewed: 10/26/2012 [employer] Interactive Patient Education  2017 [employer]  

## 2016-12-05 NOTE — Progress Notes (Signed)
Hematology and Oncology Follow Up Visit  Marissa Cruz [id number] 1934-12-05 82 y.o. 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 She is doing well. She is gaining some weight. Hopefully, the Oxandrin is helping.  She has on a  Of glasses. These really make her look good. I am very impressed with how well she looks.  The boot on her right foot hopefully will come off this Monday.  She's had no problems with cough or shortness of breath. She's had no nausea or vomiting. She's had no real changes with her bowels or bladder.  There's been no rashes. She's had no fever.  Overall, her performance status is ECOG 3.   Medications:  Allergies as of 12/05/2016      Reactions   Morphine And Related Other (See Comments)   Hallucinations and combative   Codeine Nausea Only      Medication List       Accurate as of 12/05/16 11:52 AM. Always use your most recent med list.          allopurinol 100 MG tablet Commonly known as:  ZYLOPRIM TAKE 1 TABLET BY MOUTH AT BEDTIME   ALPRAZolam 0.25 MG tablet Commonly known as:  XANAX Take 1 tablet (0.25 mg total) by mouth 3 (three) times daily as needed for anxiety.   amLODipine 5 MG tablet Commonly known as:  NORVASC TAKE 1 TABLET BY MOUTH DAILY FOR HIGH BLOOD PRESSURE   aspirin 81 MG tablet Take 81 mg by mouth daily.   cetirizine 10 MG tablet Commonly known as:  ZYRTEC Take 10 mg by mouth at bedtime.   docusate sodium 100 MG capsule Commonly known as:  COLACE Take 100 mg by mouth 2 (two) times daily.   ferrous sulfate 325 (65 FE) MG EC tablet Take 325 mg by mouth 2 (two) times daily.   HM MULTIVITAMIN ADULT  GUMMY Chew Chew 2 tablets by mouth every morning.   lidocaine-prilocaine cream Commonly known as:  EMLA Apply to affected area once   LORazepam 0.5 MG tablet Commonly known as:  ATIVAN Take 1 tablet (0.5 mg total) by mouth every 6 (six) hours as needed (Nausea or vomiting).   megestrol 40 MG/ML suspension Commonly known as:  MEGACE TAKE 10 ML BY MOUTH DAILY   metoprolol succinate 25 MG 24 hr tablet Commonly known as:  TOPROL-XL TAKE 1 TABLET(25 MG) BY MOUTH EVERY MORNING   mirtazapine 15 MG disintegrating tablet Commonly known as:  REMERON SOL-TAB DISSOLVE 1 TABLET ON THE TONGUE AT BEDTIME   nicotine 14 mg/24hr patch Commonly known as:  NICODERM CQ - dosed in mg/24 hours Place 14 mg onto the skin daily.   omeprazole 20 MG capsule Commonly known as:  PRILOSEC TAKE 1 CAPSULE BY MOUTH TWICE DAILY BEFORE A MEAL   ondansetron 8 MG disintegrating tablet Commonly known as:  ZOFRAN-ODT Take 1 tablet (8 mg total) by mouth every 8 (eight) hours as needed for nausea or vomiting. Reported on 05/17/2015   oxandrolone 10 MG tablet Commonly known as:  OXANDRIN Take 1 tablet (10 mg total) by mouth daily with breakfast.   PARoxetine 10 MG tablet Commonly known as:  PAXIL TAKE 1 TABLET BY MOUTH DAILY FOR DEPRESSION   [name] 0.625 MG tablet Generic drug:  estrogens (conjugated) TAKE 1 TABLET BY MOUTH EVERY DAY FOR 21 DAYS. DO NOT TAKE FOR 7 DAYS   traMADol 50 MG tablet Commonly known as:  ULTRAM Take 1 tablet (50 mg total) by mouth every 6 (six) hours as needed.   traZODone 50 MG tablet Commonly known as:  DESYREL TAKE 1 TABLET(50 MG) BY MOUTH AT BEDTIME       Allergies:  Allergies  Allergen Reactions  . Morphine And Related Other (See Comments)    Hallucinations and combative  . Codeine Nausea Only    Past Medical History, Surgical history, Social history, and Family History were reviewed and updated.  Review of Systems: As stated above in the interim  history  Physical Exam:  vitals were not taken for this visit.  Wt Readings from Last 3 Encounters:  12/05/16 98 lb 4 oz (44.6 kg)  11/14/16 95 lb (43.1 kg)  10/28/16 90 lb (40.8 kg)   Physical Exam  Constitutional: She is oriented to person, place, and time.  HENT:  Head: Normocephalic and atraumatic.  Mouth/Throat: Oropharynx is clear and moist.  Eyes: Pupils are equal, round, and reactive to light. EOM are normal.  Neck: Normal range of motion.  Cardiovascular: Normal rate, regular rhythm and normal heart sounds.   Pulmonary/Chest: Effort normal and breath sounds normal.  Abdominal: Soft. Bowel sounds are normal.  Musculoskeletal: Normal range of motion. She exhibits no edema, tenderness or deformity.  She does have a walking boot on the right lower leg.  Lymphadenopathy:    She has no cervical adenopathy.  Neurological: She is alert and oriented to person, place, and time.  Skin: Skin is warm and dry. No rash noted. No erythema.  Psychiatric: She has a normal mood and affect. Her behavior is normal. Judgment and thought content normal.  Vitals reviewed.    Lab Results  Component Value Date   WBC 10.2 (H) 12/05/2016   HGB 10.6 (L) 12/05/2016   HCT 32.9 (L) 12/05/2016   MCV 99 12/05/2016   PLT 415 (H) 12/05/2016   Lab Results  Component Value Date   FERRITIN 277 (H) 10/24/2016   IRON 54 10/24/2016   TIBC 233 (L) 10/24/2016   UIBC 178 10/24/2016   IRONPCTSAT 23 10/24/2016   Lab Results  Component Value Date   RBC 3.32 (L) 12/05/2016   No results found for: KPAFRELGTCHN, LAMBDASER, KAPLAMBRATIO No results found for: IGGSERUM, IGA, IGMSERUM No results found for: Odetta Pink, SPEI   Chemistry      Component Value Date/Time   NA 139 12/05/2016 1041   NA 136 03/07/2016 1055   K 4.1 12/05/2016 1041   K 4.1 03/07/2016 1055   CL 107 12/05/2016 1041   CO2 26 12/05/2016 1041   CO2 26 03/07/2016 1055   BUN 31  (H) 12/05/2016 1041   BUN 28.7 (H) 03/07/2016 1055   CREATININE 1.7 (H) 12/05/2016 1041   CREATININE 1.3 (H) 03/07/2016 1055      Component Value Date/Time   CALCIUM 9.3 12/05/2016 1041   CALCIUM 9.5 03/07/2016 1055   ALKPHOS 61 12/05/2016 1041   ALKPHOS 83 03/07/2016 1055   AST 21 12/05/2016 1041   AST 17 03/07/2016 1055   ALT 16 12/05/2016 1041   ALT 9 03/07/2016 1055   BILITOT 0.40 12/05/2016 1041   BILITOT 0.26 03/07/2016 1055  Impression and Plan: Ms. Coppock is a 82 year old white female. She has locally advanced, inoperable, stage IIIB squamous cell carcinoma of the right lung. Thankfully, she does have a high PD-L1 score.  We will set her up with a PET scan after this 12th cycle of treatment.   If everything looks stable on the PET scan, then maybe we will move her treatments out to every 4 weeks. I think this would be very reasonable for her and would be effective.   I will give her an extra week off after this cycle. I think the extra week off would do her well.   We will see her back on November 1.   Volanda Napoleon, MD 10/4/201811:52 AM

## 2016-12-05 NOTE — Progress Notes (Signed)
Ok to treat with creat 1.7 per Dr. Marin Olp

## 2016-12-06 ENCOUNTER — Other Ambulatory Visit: Payer: Self-pay | Admitting: Family Medicine

## 2016-12-09 ENCOUNTER — Ambulatory Visit (INDEPENDENT_AMBULATORY_CARE_PROVIDER_SITE_OTHER): Payer: Medicare Other | Admitting: Orthopaedic Surgery

## 2016-12-09 ENCOUNTER — Ambulatory Visit (INDEPENDENT_AMBULATORY_CARE_PROVIDER_SITE_OTHER): Payer: Medicare Other

## 2016-12-09 ENCOUNTER — Encounter (INDEPENDENT_AMBULATORY_CARE_PROVIDER_SITE_OTHER): Payer: Self-pay | Admitting: Orthopaedic Surgery

## 2016-12-09 DIAGNOSIS — M25532 Pain in left wrist: Secondary | ICD-10-CM

## 2016-12-09 DIAGNOSIS — M79671 Pain in right foot: Secondary | ICD-10-CM

## 2016-12-09 MED ORDER — DICLOFENAC SODIUM 1 % TD GEL: 2.0000 g | Freq: Four times a day (QID) | TRANSDERMAL | 5 refills | Status: DC

## 2016-12-09 NOTE — Progress Notes (Signed)
Office Visit Note   Patient: Marissa Cruz           Date of Birth: Jan 03, 1935           MRN: [id number] Visit Date: 12/09/2016              Requested by: [address], [place], [place] [address] [place], [place] [address] PCP: Carollee Herter, Alferd Apa, DO   Assessment & Plan: Visit Diagnoses:  1. Pain in right foot   2. Pain in left wrist     Plan: Overall impression is left wrist tendinitis and healing metatarsal fractures. With a right foot she can continue to increase her weightbearing activity as tolerated. She is minimally ambulatory at baseline. Patient has history of kidney disease therefore we will provide her with prescription for Voltaren gel for the wrist. Wrist brace was provided today. Questions encouraged and answered. Follow-up as needed.  Follow-Up Instructions: Return if symptoms worsen or fail to improve.   Orders:  Orders Placed This Encounter  Procedures  . XR Foot Complete Right   Meds ordered this encounter  Medications  . diclofenac sodium (VOLTAREN) 1 % GEL    Sig: Apply 2 g topically 4 (four) times daily.    Dispense:  1 Tube    Refill:  5      Procedures: No procedures performed   Clinical Data: No additional findings.   Subjective: Chief Complaint  Patient presents with  . Right Foot - Pain  . Left Knee - Pain    Patient is following up today for her right foot fractures and a new problem of left ulnar-sided wrist pain. In terms of her foot she is doing much better. She ambulates in a wheelchair  Mainly stands to transfer. She does not complain of any significant pain in her right foot. She is complaining of ulnar-sided left wrist pain. She denies any injuries or numbness and tingling.    Review of Systems  Constitutional: Negative.   HENT: Negative.   Eyes: Negative.   Respiratory: Negative.   Cardiovascular: Negative.   Endocrine: Negative.   Musculoskeletal: Negative.   Neurological: Negative.   Hematological:  Negative.   Psychiatric/Behavioral: Negative.   All other systems reviewed and are negative.    Objective: Vital Signs: There were no vitals taken for this visit.  Physical Exam  Constitutional: She is oriented to person, place, and time. She appears well-developed and well-nourished.  Pulmonary/Chest: Effort normal.  Neurological: She is alert and oriented to person, place, and time.  Skin: Skin is warm. Capillary refill takes less than 2 seconds.  Psychiatric: She has a normal mood and affect. Her behavior is normal. Judgment and thought content normal.  Nursing note and vitals reviewed.   Ortho Exam Left first exam shows tenderness over the distal ulna, ECU tendon, FCU, TFCC, pisiform. No tendon subluxation or swelling. No skin changes. Specialty Comments:  No specialty comments available.  Imaging: Xr Foot Complete Right  Result Date: 12/09/2016 Stable alignment of multiple metatarsal fractures    PMFS History: Patient Active Problem List   Diagnosis Date Noted  . Nondisplaced fracture of second metatarsal bone, right foot, initial encounter for closed fracture 10/30/2016  . Closed nondisplaced fracture of third metatarsal bone of right foot 10/30/2016  . Closed nondisplaced fracture of fourth metatarsal bone of right foot 10/30/2016  . Closed nondisplaced fracture of fifth right metatarsal bone 10/30/2016  . Closed fracture of second toe of right foot  10/30/2016  . Acute pain of left knee 10/28/2016  . Right foot pain 10/28/2016  . Hematochezia   . Angiodysplasia of colon   . Acute blood loss anemia   . GI bleed 07/13/2016  . Rectal bleeding 07/13/2016  . Diarrhea 07/13/2016  . Iron deficiency anemia due to chronic blood loss 06/06/2016  . 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 Goals of care, counseling/discussion 03/07/2016  . Malignant neoplasm of lower lobe of right lung ([place]) 02/08/2016  . Lung mass 12/28/2015  . Protein-calorie malnutrition ([place]) 08/20/2014  .  Pressure ulcer 08/20/2014  . CAP (community acquired pneumonia) 08/19/2014  . Tobacco use disorder 08/19/2014  . CKD (chronic kidney disease) stage 4, GFR 15-29 ml/min ([facility]) 08/19/2014  . Occult blood in stools 01/31/2014  . Anemia 01/31/2014  . Benign neoplasm of transverse colon 01/31/2014  . Hematuria 01/07/2014  . Leukocytes in urine 01/07/2014  . Acute bronchitis 01/07/2014  . Back pain 01/07/2014  . 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 HTN (hypertension) 10/21/2013  . Generalized anxiety disorder 10/21/2013  . Gout 10/21/2013  . GERD (gastroesophageal reflux disease) 10/21/2013   Past Medical History:  Diagnosis Date  . Age-related macular degeneration, dry, right eye   . Age-related macular degeneration, wet, left eye ([place])   . Anemia   . Anxiety   . Arthritis    "thumbs, hands" (08/18/2014)  . CAP (community acquired pneumonia) 08/19/2014  . Carotid artery disease ([place])   . Chicken pox   . Chronic lower back pain   . Colon polyp   . Depression   . Diverticulitis   . GERD (gastroesophageal reflux disease)   . Goals of care, counseling/discussion 03/07/2016  . History of blood transfusion 1976; 2013   "w/hysterectomy; hip OR"  . History of gout   . Hypertension   . Iron deficiency anemia due to chronic blood loss 06/06/2016  . 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 Kidney disease   . 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 TIA (transient ischemic attack) ~ 2009 X 2  . Vocal cord cancer ([facility]) ~ 2009   S/P radiation    Family History  Problem Relation Age of Onset  . Hypertension Father   . Skin cancer Father   . Emphysema Paternal Grandfather   . Liver cancer Mother   . Stomach cancer Maternal Grandfather     Past Surgical History:  Procedure Laterality Date  . ABDOMINAL HYSTERECTOMY  1976  . APPENDECTOMY  1954  . CAROTID ENDARTERECTOMY Left ~ 2014  . CATARACT EXTRACTION W/ INTRAOCULAR LENS  IMPLANT, BILATERAL Bilateral ~ 2009  .  COLONOSCOPY N/A 01/31/2014   Procedure: COLONOSCOPY;  Surgeon: Ladene Artist, MD;  Location: [facility];  Service: Endoscopy;  Laterality: N/A;  . COLONOSCOPY N/A 07/15/2016   Procedure: COLONOSCOPY;  Surgeon: Milus Banister, MD;  Location: [facility]OS[facility];  Service: Endoscopy;  Laterality: N/A;  . DILATION AND CURETTAGE OF UTERUS    . IR GENERIC HISTORICAL  03/12/2016   IR US GUIDE VASC ACCESS RIGHT 03/12/2016 Jacqulynn Cadet, MD [facility]  . IR GENERIC HISTORICAL  03/12/2016   IR FLUORO GUIDE PORT INSERTION RIGHT 03/12/2016 Jacqulynn Cadet, MD [facility]  . JOINT REPLACEMENT    . MOUTH FLOOR MASS EXCISION  03/2014   "removed cancer underneath my tongue"  . TONSILLECTOMY  1941  . TOTAL HIP ARTHROPLASTY Left 2013   Social History   Occupational History  . Retired    Science writer  History Main Topics  . Smoking status: Light Tobacco Smoker    Packs/day: 1.00    Years: 60.00    Types: Cigarettes    Last attempt to quit: 05/15/2016  . Smokeless tobacco: Never Used     Comment: states "one cigarette a week"  . Alcohol use 4.2 oz/week    7 Shots of liquor per week     Comment:  " shots brandy q night"  . Drug use: No  . Sexual activity: Not Currently

## 2016-12-17 ENCOUNTER — Other Ambulatory Visit: Payer: Self-pay | Admitting: Family Medicine

## 2016-12-24 ENCOUNTER — Encounter: Payer: Self-pay | Admitting: Podiatry

## 2016-12-24 ENCOUNTER — Ambulatory Visit (INDEPENDENT_AMBULATORY_CARE_PROVIDER_SITE_OTHER): Payer: Medicare Other | Admitting: Podiatry

## 2016-12-24 VITALS — BP 161/74 | HR 94 | Ht 67.0 in | Wt 95.0 lb

## 2016-12-24 DIAGNOSIS — M79672 Pain in left foot: Secondary | ICD-10-CM

## 2016-12-24 DIAGNOSIS — I739 Peripheral vascular disease, unspecified: Secondary | ICD-10-CM | POA: Diagnosis not present

## 2016-12-24 DIAGNOSIS — M79671 Pain in right foot: Secondary | ICD-10-CM

## 2016-12-24 DIAGNOSIS — B351 Tinea unguium: Secondary | ICD-10-CM | POA: Diagnosis not present

## 2016-12-24 NOTE — Patient Instructions (Signed)
Seen for hypertrophic nails. All nails debrided. Return in 3 months or as needed.  

## 2016-12-24 NOTE — Progress Notes (Signed)
SUBJECTIVE: 82 y.o. year old female presents complaining of ingrown nails on both feet. Patient is wheel chair bound and accompanied by her care taker. Patient is now wearing open dandals.  HPI:  She fell out from her bed, broke right foot and injured left foot 6 weeks ago. Was treated with special walking boot by an Orthopedist. Diagnosed with lung cancer and under care since February 2018. Lung cancer is treated with immunotherapy.  Review of Systems  Constitutional: Positive for weight loss. Negative for chills, fever and malaise/fatigue.       Gradual weight loss for a couple of years. Possible from the lung cancer.  HENT: Negative for ear discharge, ear pain, hearing loss and tinnitus.   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  Respiratory: Negative for cough, hemoptysis, shortness of breath and wheezing.   Cardiovascular: Negative for chest pain, orthopnea, claudication and leg swelling.  Gastrointestinal: Positive for nausea. Negative for heartburn and vomiting.       Frequent indigestion and nausea, controlled with medication.   Genitourinary: Negative for dysuria, frequency, hematuria and urgency.  Musculoskeletal: Positive for joint pain and neck pain.       Arthritic pain in back and hand. Mandibular joint pain on right side. Left side hip replacement in 2011.   Neurological: Negative for dizziness, tingling, tremors, weakness and headaches.    OBJECTIVE: DERMATOLOGIC EXAMINATION: Thick dystrophic nails x 10. All lesser toe nails, 2-4 are ingrown. Right hallucal nail is loose, left hallucal nail has broken off.  VASCULAR EXAMINATION OF LOWER LIMBS: DP on left is faintly palpable. Right DP not palpable. Both PT not palpable. Capillary Filling times within 3 seconds in all digits.  Mild forefoot edema post injury, possible lesser metatarsal fracture right. Temperature gradient from tibial crest to dorsum of foot is within normal  bilateral.  NEUROLOGIC EXAMINATION OF THE LOWER LIMBS: Achilles DTR is present and within normal. Monofilament (Semmes-Weinstein 10-gm) sensory testing positive 6 out of 6, bilateral. Vibratory sensations(128Hz  turning fork) intact at medial and lateral forefoot bilateral.  Sharp and Dull discriminatory sensations at the plantar ball of hallux is intact bilateral.   MUSCULOSKELETAL EXAMINATION: Positive for Hallux valgus with bunion bilateral.  ASSESSMENT: Recent foot injury recovering well. Onychomycosis x 10. Pain on feet. PVD.  PLAN: Reviewed findings and available treatment options. All nails debrided. May return in 3 month.

## 2016-12-26 ENCOUNTER — Ambulatory Visit (HOSPITAL_COMMUNITY)
Admission: RE | Admit: 2016-12-26 | Discharge: 2016-12-26 | Disposition: A | Discharge status: Home / Self Care | Payer: Medicare Other | Source: Ambulatory Visit | Attending: Hematology & Oncology | Admitting: Hematology & Oncology

## 2016-12-26 DIAGNOSIS — D500 Iron deficiency anemia secondary to blood loss (chronic): Secondary | ICD-10-CM | POA: Insufficient documentation

## 2016-12-26 DIAGNOSIS — R9389 Abnormal findings on diagnostic imaging of other specified body structures: Secondary | ICD-10-CM | POA: Diagnosis not present

## 2016-12-26 DIAGNOSIS — C3491 Malignant neoplasm of unspecified part of right bronchus or lung: Secondary | ICD-10-CM | POA: Insufficient documentation

## 2016-12-26 DIAGNOSIS — C3490 Malignant neoplasm of unspecified part of unspecified bronchus or lung: Secondary | ICD-10-CM | POA: Diagnosis present

## 2016-12-26 DIAGNOSIS — C3431 Malignant neoplasm of lower lobe, right bronchus or lung: Secondary | ICD-10-CM | POA: Diagnosis not present

## 2016-12-26 LAB — GLUCOSE, CAPILLARY
Glucose-Capillary: 63 mg/dL — ABNORMAL LOW (ref 65–99)
Glucose-Capillary: 71 mg/dL (ref 65–99)

## 2016-12-26 MED ORDER — FLUDEOXYGLUCOSE F - 18 (FDG) INJECTION
4.9000 | Freq: Once | INTRAVENOUS | Status: AC | PRN
  Administered 2016-12-26: 4.9 via INTRAVENOUS

## 2016-12-30 ENCOUNTER — Ambulatory Visit (HOSPITAL_COMMUNITY): Payer: Medicare Other

## 2017-01-02 ENCOUNTER — Other Ambulatory Visit (HOSPITAL_BASED_OUTPATIENT_CLINIC_OR_DEPARTMENT_OTHER): Payer: Medicare Other

## 2017-01-02 ENCOUNTER — Ambulatory Visit: Payer: Medicare Other

## 2017-01-02 ENCOUNTER — Ambulatory Visit (HOSPITAL_BASED_OUTPATIENT_CLINIC_OR_DEPARTMENT_OTHER): Payer: Medicare Other | Admitting: Hematology & Oncology

## 2017-01-02 ENCOUNTER — Ambulatory Visit (HOSPITAL_BASED_OUTPATIENT_CLINIC_OR_DEPARTMENT_OTHER): Payer: Medicare Other

## 2017-01-02 VITALS — BP 111/59 | HR 100 | Temp 98.0°F | Resp 19 | Wt 96.0 lb

## 2017-01-02 DIAGNOSIS — Z5112 Encounter for antineoplastic immunotherapy: Secondary | ICD-10-CM | POA: Diagnosis not present

## 2017-01-02 DIAGNOSIS — D509 Iron deficiency anemia, unspecified: Secondary | ICD-10-CM | POA: Diagnosis not present

## 2017-01-02 DIAGNOSIS — C3490 Malignant neoplasm of unspecified part of unspecified bronchus or lung: Secondary | ICD-10-CM

## 2017-01-02 DIAGNOSIS — C3431 Malignant neoplasm of lower lobe, right bronchus or lung: Secondary | ICD-10-CM

## 2017-01-02 DIAGNOSIS — Z23 Encounter for immunization: Secondary | ICD-10-CM | POA: Diagnosis not present

## 2017-01-02 DIAGNOSIS — R197 Diarrhea, unspecified: Secondary | ICD-10-CM | POA: Diagnosis not present

## 2017-01-02 DIAGNOSIS — R918 Other nonspecific abnormal finding of lung field: Secondary | ICD-10-CM

## 2017-01-02 DIAGNOSIS — D500 Iron deficiency anemia secondary to blood loss (chronic): Secondary | ICD-10-CM

## 2017-01-02 LAB — CBC WITH DIFFERENTIAL (CANCER CENTER ONLY)
BASO#: 0.1 10*3/uL (ref 0.0–0.2)
BASO%: 0.8 % (ref 0.0–2.0)
EOS ABS: 0.3 10*3/uL (ref 0.0–0.5)
EOS%: 2.6 % (ref 0.0–7.0)
HEMATOCRIT: 33.5 % — AB (ref 34.8–46.6)
HGB: 11 g/dL — ABNORMAL LOW (ref 11.6–15.9)
LYMPH#: 1.3 10*3/uL (ref 0.9–3.3)
LYMPH%: 12.7 % — ABNORMAL LOW (ref 14.0–48.0)
MCH: 31.7 pg (ref 26.0–34.0)
MCHC: 32.8 g/dL (ref 32.0–36.0)
MCV: 97 fL (ref 81–101)
MONO#: 0.9 10*3/uL (ref 0.1–0.9)
MONO%: 8.6 % (ref 0.0–13.0)
NEUT#: 7.9 10*3/uL — ABNORMAL HIGH (ref 1.5–6.5)
NEUT%: 75.3 % (ref 39.6–80.0)
Platelets: 410 10*3/uL — ABNORMAL HIGH (ref 145–400)
RBC: 3.47 10*6/uL — ABNORMAL LOW (ref 3.70–5.32)
RDW: 14.9 % (ref 11.1–15.7)
WBC: 10.5 10*3/uL — ABNORMAL HIGH (ref 3.9–10.0)

## 2017-01-02 LAB — CMP (CANCER CENTER ONLY)
ALT(SGPT): 19 U/L (ref 10–47)
AST: 25 U/L (ref 11–38)
Albumin: 2.9 g/dL — ABNORMAL LOW (ref 3.3–5.5)
Alkaline Phosphatase: 48 U/L (ref 26–84)
BUN, Bld: 29 mg/dL — ABNORMAL HIGH (ref 7–22)
CALCIUM: 10.1 mg/dL (ref 8.0–10.3)
CO2: 24 meq/L (ref 18–33)
Chloride: 102 mEq/L (ref 98–108)
Creat: 2.3 mg/dl — ABNORMAL HIGH (ref 0.6–1.2)
GLUCOSE: 141 mg/dL — AB (ref 73–118)
POTASSIUM: 4 meq/L (ref 3.3–4.7)
Sodium: 143 mEq/L (ref 128–145)
Total Bilirubin: 0.5 mg/dl (ref 0.20–1.60)
Total Protein: 6.1 g/dL — ABNORMAL LOW (ref 6.4–8.1)

## 2017-01-02 LAB — FERRITIN: Ferritin: 276 ng/ml — ABNORMAL HIGH (ref 9–269)

## 2017-01-02 LAB — IRON AND TIBC
%SAT: 18 % — AB (ref 21–57)
IRON: 40 ug/dL — AB (ref 41–142)
TIBC: 230 ug/dL — AB (ref 236–444)
UIBC: 190 ug/dL (ref 120–384)

## 2017-01-02 LAB — TECHNOLOGIST REVIEW CHCC SATELLITE

## 2017-01-02 LAB — LACTATE DEHYDROGENASE: LDH: 142 U/L (ref 125–245)

## 2017-01-02 MED ORDER — SODIUM CHLORIDE 0.9 % IV SOLN
Freq: Once | INTRAVENOUS | Status: AC
  Administered 2017-01-02: 13:00:00 via INTRAVENOUS

## 2017-01-02 MED ORDER — SODIUM CHLORIDE 0.9% FLUSH
10.0000 mL | INTRAVENOUS | Status: DC | PRN
  Filled 2017-01-02: qty 10

## 2017-01-02 MED ORDER — INFLUENZA VAC SPLIT HIGH-DOSE 0.5 ML IM SUSY
0.5000 mL | PREFILLED_SYRINGE | INTRAMUSCULAR | Status: AC
  Administered 2017-01-02: 0.5 mL via INTRAMUSCULAR

## 2017-01-02 MED ORDER — HEPARIN SOD (PORK) LOCK FLUSH 100 UNIT/ML IV SOLN
500.0000 [IU] | Freq: Once | INTRAVENOUS | Status: AC | PRN
  Administered 2017-01-02: 500 [IU]
  Filled 2017-01-02: qty 5

## 2017-01-02 MED ORDER — SODIUM CHLORIDE 0.9% FLUSH
10.0000 mL | INTRAVENOUS | Status: DC | PRN
  Administered 2017-01-02: 10 mL
  Filled 2017-01-02: qty 10

## 2017-01-02 MED ORDER — SODIUM CHLORIDE 0.9 % IV SOLN
200.0000 mg | Freq: Once | INTRAVENOUS | Status: AC
  Administered 2017-01-02: 200 mg via INTRAVENOUS
  Filled 2017-01-02: qty 8

## 2017-01-02 MED ORDER — HEPARIN SOD (PORK) LOCK FLUSH 100 UNIT/ML IV SOLN
500.0000 [IU] | Freq: Once | INTRAVENOUS | Status: DC | PRN
  Filled 2017-01-02: qty 5

## 2017-01-02 MED ORDER — INFLUENZA VAC SPLIT HIGH-DOSE 0.5 ML IM SUSY
PREFILLED_SYRINGE | INTRAMUSCULAR | Status: AC
  Filled 2017-01-02: qty 0.5

## 2017-01-02 MED ORDER — SODIUM CHLORIDE 0.9% FLUSH
10.0000 mL | INTRAVENOUS | Status: DC | PRN
  Administered 2017-01-02: 10 mL via INTRAVENOUS
  Filled 2017-01-02: qty 10

## 2017-01-02 NOTE — Patient Instructions (Signed)
[facility] Discharge Instructions for Patients Receiving Chemotherapy  Today you received the following chemotherapy agents :  Keytruda.  To help prevent nausea and vomiting after your treatment, we encourage you to take your nausea medication as prescribed.   If you develop nausea and vomiting that is not controlled by your nausea medication, call the clinic.   BELOW ARE SYMPTOMS THAT SHOULD BE REPORTED IMMEDIATELY:  *FEVER GREATER THAN 100.5 F  *CHILLS WITH OR WITHOUT FEVER  NAUSEA AND VOMITING THAT IS NOT CONTROLLED WITH YOUR NAUSEA MEDICATION  *UNUSUAL SHORTNESS OF BREATH  *UNUSUAL BRUISING OR BLEEDING  TENDERNESS IN MOUTH AND THROAT WITH OR WITHOUT PRESENCE OF ULCERS  *URINARY PROBLEMS  *BOWEL PROBLEMS  UNUSUAL RASH Items with * indicate a potential emergency and should be followed up as soon as possible.  Feel free to call the clinic should you have any questions or concerns. The clinic phone number is [phone].  Please show the CHEMO ALERT CARD at check-in to the Emergency Department and triage nurse.  

## 2017-01-02 NOTE — Patient Instructions (Signed)
Implanted Port Home Guide An implanted port is a type of central line that is placed under the skin. Central lines are used to provide IV access when treatment or nutrition needs to be given through a person's veins. Implanted ports are used for long-term IV access. An implanted port may be placed because:  You need IV medicine that would be irritating to the small veins in your hands or arms.  You need long-term IV medicines, such as antibiotics.  You need IV nutrition for a long period.  You need frequent blood draws for lab tests.  You need dialysis.  Implanted ports are usually placed in the chest area, but they can also be placed in the upper arm, the abdomen, or the leg. An implanted port has two main parts:  Reservoir. The reservoir is round and will appear as a small, raised area under your skin. The reservoir is the part where a needle is inserted to give medicines or draw blood.  Catheter. The catheter is a thin, flexible tube that extends from the reservoir. The catheter is placed into a large vein. Medicine that is inserted into the reservoir goes into the catheter and then into the vein.  How will I care for my incision site? Do not get the incision site wet. Bathe or shower as directed by your health care provider. How is my port accessed? Special steps must be taken to access the port:  Before the port is accessed, a numbing cream can be placed on the skin. This helps numb the skin over the port site.  Your health care provider uses a sterile technique to access the port. ? Your health care provider must put on a mask and sterile gloves. ? The skin over your port is cleaned carefully with an antiseptic and allowed to dry. ? The port is gently pinched between sterile gloves, and a needle is inserted into the port.  Only "non-coring" port needles should be used to access the port. Once the port is accessed, a blood return should be checked. This helps ensure that the port  is in the vein and is not clogged.  If your port needs to remain accessed for a constant infusion, a clear (transparent) bandage will be placed over the needle site. The bandage and needle will need to be changed every week, or as directed by your health care provider.  Keep the bandage covering the needle clean and dry. Do not get it wet. Follow your health care provider's instructions on how to take a shower or bath while the port is accessed.  If your port does not need to stay accessed, no bandage is needed over the port.  What is flushing? Flushing helps keep the port from getting clogged. Follow your health care provider's instructions on how and when to flush the port. Ports are usually flushed with saline solution or a medicine called heparin. The need for flushing will depend on how the port is used.  If the port is used for intermittent medicines or blood draws, the port will need to be flushed: ? After medicines have been given. ? After blood has been drawn. ? As part of routine maintenance.  If a constant infusion is running, the port may not need to be flushed.  How long will my port stay implanted? The port can stay in for as long as your health care provider thinks it is needed. When it is time for the port to come out, surgery will be   done to remove it. The procedure is similar to the one performed when the port was put in. When should I seek immediate medical care? When you have an implanted port, you should seek immediate medical care if:  You notice a bad smell coming from the incision site.  You have swelling, redness, or drainage at the incision site.  You have more swelling or pain at the port site or the surrounding area.  You have a fever that is not controlled with medicine.  This information is not intended to replace advice given to you by your health care provider. Make sure you discuss any questions you have with your health care provider. Document  Released: 02/18/2005 Document Revised: 07/27/2015 Document Reviewed: 10/26/2012 [employer] Interactive Patient Education  2017 [employer]  

## 2017-01-02 NOTE — Progress Notes (Signed)
Hematology and Oncology Follow Up Visit  Marissa Cruz [id number] 1934-09-14 82 y.o. 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 He is doing okay. Thank you, the boot has come off Marissa Cruz right foot. She is able to walk a little bit.  She is now having some diarrhea. She has about 2 stools a day. I do not think it is from the pembrolizumab. I'm not sure what it could be from. It might be from Marissa Cruz diet. I told Marissa Cruz to try some Imodium.  Because of the diarrhea, Marissa Cruz weight is down a couple pounds.  We did go ahead and repeat a PET scan on Marissa Cruz. Overall, the PET scan looked relatively stable. There were some areas that appear to be better, and then there were some areas that appear to be worse. There were no new areas that I could tell. Because of this, and because of Ms. Bold poor performance status, I think that we should continue with the pembrolizumab as this really would be the only, therapy that she can handle.  She has had no problems with cough. There is no shortness of breath. She's had no bleeding. There's been no rashes. She's had no headache.   Overall, Marissa Cruz performance status is ECOG 3.   Medications:  Allergies as of 01/02/2017      Reactions   Morphine And Related Other (See Comments)   Hallucinations and combative   Codeine Nausea Only      Medication List       Accurate as of 01/02/17  1:34 PM. Always use your most recent med list.          allopurinol 100 MG tablet Commonly known as:  ZYLOPRIM TAKE 1 TABLET BY MOUTH AT BEDTIME   ALPRAZolam 0.25 MG tablet Commonly known as:  XANAX Take 1 tablet (0.25 mg total) by mouth 3 (three) times daily as  needed for anxiety.   amLODipine 5 MG tablet Commonly known as:  NORVASC TAKE 1 TABLET BY MOUTH DAILY FOR HIGH BLOOD PRESSURE   aspirin 81 MG tablet Take 81 mg by mouth daily.   cetirizine 10 MG tablet Commonly known as:  ZYRTEC Take 10 mg by mouth at bedtime.   diclofenac sodium 1 % Gel Commonly known as:  VOLTAREN Apply 2 g topically 4 (four) times daily.   docusate sodium 100 MG capsule Commonly known as:  COLACE Take 100 mg by mouth 2 (two) times daily.   ferrous sulfate 325 (65 FE) MG EC tablet Take 325 mg by mouth 2 (two) times daily.   HM MULTIVITAMIN ADULT GUMMY Chew Chew 2 tablets by mouth every morning.   lidocaine-prilocaine cream Commonly known as:  EMLA Apply to affected area once   LORazepam 0.5 MG tablet Commonly known as:  ATIVAN Take 1 tablet (0.5 mg total) by mouth every 6 (six) hours as needed (Nausea or vomiting).   megestrol 40 MG/ML suspension Commonly known as:  MEGACE TAKE 10 ML BY MOUTH DAILY   metoprolol succinate 25 MG 24 hr tablet Commonly known as:  TOPROL-XL TAKE 1 TABLET(25 MG) BY MOUTH EVERY MORNING   mirtazapine 15 MG disintegrating tablet Commonly known as:  REMERON SOL-TAB DISSOLVE 1 TABLET ON THE TONGUE AT BEDTIME   nicotine 14 mg/24hr patch Commonly known as:  NICODERM CQ - dosed in mg/24 hours Place 14 mg onto the skin daily.   omeprazole 20 MG capsule Commonly known as:  PRILOSEC TAKE 1 CAPSULE BY MOUTH TWICE DAILY BEFORE A MEAL   ondansetron 8 MG disintegrating tablet Commonly known as:  ZOFRAN-ODT Take 1 tablet (8 mg total) by mouth every 8 (eight) hours as needed for nausea or vomiting. Reported on 05/17/2015   oxandrolone 10 MG tablet Commonly known as:  OXANDRIN Take 1 tablet (10 mg total) by mouth daily with breakfast.   PARoxetine 10 MG tablet Commonly known as:  PAXIL TAKE 1 TABLET BY MOUTH DAILY FOR DEPRESSION   [name] 0.625 MG tablet Generic drug:  estrogens (conjugated) TAKE 1 TABLET BY MOUTH EVERY  DAY FOR 21 DAYS. DO NOT TAKE FOR 7 DAYS   traMADol 50 MG tablet Commonly known as:  ULTRAM Take 1 tablet (50 mg total) by mouth every 6 (six) hours as needed.   traZODone 50 MG tablet Commonly known as:  DESYREL TAKE 1 TABLET(50 MG) BY MOUTH AT BEDTIME       Allergies:  Allergies  Allergen Reactions  . Morphine And Related Other (See Comments)    Hallucinations and combative  . Codeine Nausea Only    Past Medical History, Surgical history, Social history, and Family History were reviewed and updated.  Review of Systems: As stated above in the interim history  Physical Exam:  weight is 96 lb (43.5 kg). Marissa Cruz oral temperature is 98 F (36.7 C). Marissa Cruz blood pressure is 111/59 (abnormal) and Marissa Cruz pulse is 100. Marissa Cruz respiration is 19 and oxygen saturation is 98%.   Wt Readings from Last 3 Encounters:  01/02/17 96 lb (43.5 kg)  12/24/16 95 lb (43.1 kg)  12/05/16 98 lb 4 oz (44.6 kg)   Physical Exam  Constitutional: She is oriented to person, place, and time.  HENT:  Head: Normocephalic and atraumatic.  Mouth/Throat: Oropharynx is clear and moist.  Eyes: Pupils are equal, round, and reactive to light. EOM are normal.  Neck: Normal range of motion.  Cardiovascular: Normal rate, regular rhythm and normal heart sounds.   Pulmonary/Chest: Effort normal and breath sounds normal.  Abdominal: Soft. Bowel sounds are normal.  Musculoskeletal: Normal range of motion. She exhibits no edema, tenderness or deformity.  She does have a walking boot on the right lower leg.  Lymphadenopathy:    She has no cervical adenopathy.  Neurological: She is alert and oriented to person, place, and time.  Skin: Skin is warm and dry. No rash noted. No erythema.  Psychiatric: She has a normal mood and affect. Marissa Cruz behavior is normal. Judgment and thought content normal.  Vitals reviewed.    Lab Results  Component Value Date   WBC 10.5 (H) 01/02/2017   HGB 11.0 (L) 01/02/2017   HCT 33.5 (L) 01/02/2017    MCV 97 01/02/2017   PLT 410 (H) 01/02/2017   Lab Results  Component Value Date   FERRITIN 253 12/05/2016   IRON 40 (L) 01/02/2017   TIBC 230 (L) 01/02/2017   UIBC 190 01/02/2017   IRONPCTSAT 18 (L) 01/02/2017   Lab Results  Component Value Date   RBC 3.47 (L) 01/02/2017   No results found for: KPAFRELGTCHN, LAMBDASER, KAPLAMBRATIO No results found for: IGGSERUM, IGA, IGMSERUM No results found for: TOTALPROTELP, ALBUMINELP, A1GS, A2GS, BETS, BETA2SER, [place], [place], [place]  Component Value Date/Time   NA 143 01/02/2017 1110   NA 136 03/07/2016 1055   K 4.0 01/02/2017 1110   K 4.1 03/07/2016 1055   CL 102 01/02/2017 1110   CO2 24 01/02/2017 1110   CO2 26 03/07/2016 1055   BUN 29 (H) 01/02/2017 1110   BUN 28.7 (H) 03/07/2016 1055   CREATININE 2.3 (H) 01/02/2017 1110   CREATININE 1.3 (H) 03/07/2016 1055      Component Value Date/Time   CALCIUM 10.1 01/02/2017 1110   CALCIUM 9.5 03/07/2016 1055   ALKPHOS 48 01/02/2017 1110   ALKPHOS 83 03/07/2016 1055   AST 25 01/02/2017 1110   AST 17 03/07/2016 1055   ALT 19 01/02/2017 1110   ALT 9 03/07/2016 1055   BILITOT 0.50 01/02/2017 1110   BILITOT 0.26 03/07/2016 1055      Impression and Plan: Ms. Brash is a 82 year old white female. She has locally advanced, inoperable, stage IIIB squamous cell carcinoma of the right lung. Thankfully, she does have a high PD-L1 score.  We will move ahead with additional pembrolizumab. I think that this is doing fairly well.  I think if we find that she does not have further response, we can probably consider using oral therapy with afatinib. This can be utilized in patients I think we do not have the EGFR mutation with squamous cell cancer in the second line therapy.  I will plan to see Marissa Cruz back in another 4 weeks. I know that she will have a nice Thanksgiving.  The good news is that Marissa Cruz grandson and family are moving down from [place]. She will be able to move in with them  and I think this will help with Marissa Cruz eating and with Marissa Cruz overall performance status.   Volanda Napoleon, MD 11/1/20181:34 PM

## 2017-01-10 ENCOUNTER — Ambulatory Visit: Payer: Medicare Other | Attending: Retina Specialist | Admitting: Occupational Therapy

## 2017-01-10 ENCOUNTER — Encounter: Payer: Self-pay | Admitting: Occupational Therapy

## 2017-01-10 DIAGNOSIS — H53413 Scotoma involving central area, bilateral: Secondary | ICD-10-CM | POA: Insufficient documentation

## 2017-01-10 NOTE — Therapy (Signed)
[facility] [address] [place], [place], [address] Phone: [phone]   Fax:  [phone]  Occupational Therapy Evaluation  Patient Details  Name: Marissa Cruz MRN: [id number] Date of Birth: October 07, 1934 Referring Provider: dr. Billie Ruddy   Encounter Date: 01/10/2017  OT End of Session - 01/10/17 1222    Visit Number  1    Number of Visits  1    Date for OT Re-Evaluation  -- n/a    Authorization Type  Medicare, BCBS secondary    Authorization - Visit Number  1    Authorization - Number of Visits  1    OT Start Time  6270    OT Stop Time  1138    OT Time Calculation (min)  33 min    Activity Tolerance  Patient tolerated treatment well    Behavior During Therapy  [facility] for tasks assessed/performed       Past Medical History:  Diagnosis Date  . Age-related macular degeneration, dry, right eye   . Age-related macular degeneration, wet, left eye ([place])   . Anemia   . Anxiety   . Arthritis    "thumbs, hands" (08/18/2014)  . CAP (community acquired pneumonia) 08/19/2014  . Carotid artery disease ([place])   . Chicken pox   . Chronic lower back pain   . Colon polyp   . Depression   . Diverticulitis   . GERD (gastroesophageal reflux disease)   . Goals of care, counseling/discussion 03/07/2016  . History of blood transfusion 1976; 2013   "w/hysterectomy; hip OR"  . History of gout   . Hypertension   . Iron deficiency anemia due to chronic blood loss 06/06/2016  . 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 Kidney disease   . 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 TIA (transient ischemic attack) ~ 2009 X 2  . Vocal cord cancer ([place]) ~ 2009   S/P radiation    Past Surgical History:  Procedure Laterality Date  . ABDOMINAL HYSTERECTOMY  1976  . APPENDECTOMY  1954  . CAROTID ENDARTERECTOMY Left ~ 2014  . CATARACT EXTRACTION W/ INTRAOCULAR LENS  IMPLANT, BILATERAL Bilateral ~ 2009  . DILATION  AND CURETTAGE OF UTERUS    . IR GENERIC HISTORICAL  03/12/2016   IR US GUIDE VASC ACCESS RIGHT 03/12/2016 Jacqulynn Cadet, MD [facility]-INTERV RAD  . IR GENERIC HISTORICAL  03/12/2016   IR FLUORO GUIDE PORT INSERTION RIGHT 03/12/2016 Jacqulynn Cadet, MD [facility]-INTERV RAD  . JOINT REPLACEMENT    . MOUTH FLOOR MASS EXCISION  03/2014   "removed cancer underneath my tongue"  . TONSILLECTOMY  1941  . TOTAL HIP ARTHROPLASTY Left 2013    There were no vitals filed for this visit.  Subjective Assessment - 01/10/17 1215    Subjective   82 y.o with macular degeneration presents with visual deficits which impede perfromance of ADLS/IAADLS    Patient is accompained by:  Family member    Pertinent History  PMH significant for arthritis, TIA, lung CA currently receiving tx, fx of right foot-see snapshot for complete information    Patient Stated Goals  to read easier         [facility] OT Assessment - 01/10/17 1110      Assessment   Diagnosis  macular degeneration    Referring Provider  dr. Billie Ruddy    Onset Date  -- 10 yrs      Precautions   Precautions  Fall  Balance Screen   Has the patient fallen in the past 6 months  --      Home  Environment   Family/patient expects to be discharged to:  Private residence    [place]  -- 1 step entrance    Lives With  Family moving to a home with other family      Prior Function   Level of Independence  Needs assistance with ADLs;Needs assistance with homemaking;Needs assistance with gait;Needs assistance with transfers    Vocation  Retired      ADL   ADL comments  Pt has a caregiver who assists with showering, pt completes other ADLS modified independentlt      IADL   Shopping  Needs to be accompanied on any shopping trip    Light Housekeeping  Needs help with all home maintenance tasks    Meal Prep  Needs to have meals prepared and served    Financial Management  Dependent      Mobility   Mobility Status   Needs assist      Vision - History   Visual History  Macular degeneration      Vision Assessment   Vision Assessment  Vision tested    Per MD/OD Report  OD 20/400, OS 20/300    Reading Acuity  20/400    Visual Fields  -- central visual field deficit   Discussion with pt/ daughter that due to the severity  of pt's visual deficits pt is unable to read effectively. Therapist recommends referral to [facility] for books on tape. Pt/ dtr are in agreement.     Cognition   Overall Cognitive Status  Within Functional Limits for tasks assessed                      OT Education - 01/10/17 1242    Education provided  Yes    Education Details  eccentric viewing techniques, use of high marks, referral to [facility], use of signiture line guide    Person(s) Educated  Patient;Child(ren)    Methods  Explanation;Demonstration;Verbal cues    Comprehension  Verbalized understanding;Returned demonstration                 Plan - 01/10/17 1223    Clinical Impression Statement  Pt is an 82 y.o with macular degeneration who presents to occupational therapy with significant visual impairments which impede performance of ADLs/IADLs. Pt can benefit from skilled occupational therapy to address pt visual deficits.    Occupational Profile and client history currently impacting functional performance  Pt requires assistance with bathing , meal prep and home management. Family is moving to live with her today.    Occupational performance deficits (Please refer to evaluation for details):  ADL's;IADL's;Leisure;Social Participation    Rehab Potential  Fair    Current Impairments/barriers affecting progress:  severity of visual impairments    OT Frequency  One time visit    OT Duration  8 weeks    OT Treatment/Interventions  Self-care/ADL training;Patient/family education;DME and/or AE instruction    Plan  Pt was seen for evaluation. Due to the severity of pt's visual  impairment, pt will benefit from referral to [facility] for books on tape. No additional OT recommended at this time., therefore no gaols were set.    Clinical Decision Making  Limited treatment options, no task modification necessary    Consulted and  Agree with Plan of Care  Patient;Family member/caregiver       Patient will benefit from skilled therapeutic intervention in order to improve the following deficits and impairments:  Impaired vision/preception  Visit Diagnosis: [place] involving central area, bilateral - Plan: Ot plan of care cert/re-cert  G-Codes - 27/61/47 1248    Functional Assessment Tool Used (Outpatient only)  clinical impressions    Functional Limitation  Self care    Self Care Current Status [phone])  At least 40 percent but less than 60 percent impaired, limited or restricted    Self Care Goal Status ([id number])  At least 40 percent but less than 60 percent impaired, limited or restricted    Self Care Discharge Status [phone])  At least 40 percent but less than 60 percent impaired, limited or restricted       Problem List Patient Active Problem List   Diagnosis Date Noted  . Nondisplaced fracture of second metatarsal bone, right foot, initial encounter for closed fracture 10/30/2016  . Closed nondisplaced fracture of third metatarsal bone of right foot 10/30/2016  . Closed nondisplaced fracture of fourth metatarsal bone of right foot 10/30/2016  . Closed nondisplaced fracture of fifth right metatarsal bone 10/30/2016  . Closed fracture of second toe of right foot 10/30/2016  . Acute pain of left knee 10/28/2016  . Right foot pain 10/28/2016  . Hematochezia   . Angiodysplasia of colon   . Acute blood loss anemia   . GI bleed 07/13/2016  . Rectal bleeding 07/13/2016  . Diarrhea 07/13/2016  . Iron deficiency anemia due to chronic blood loss 06/06/2016  . 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 Goals of care, counseling/discussion 03/07/2016  . Malignant  neoplasm of lower lobe of right lung ([place]) 02/08/2016  . Lung mass 12/28/2015  . Protein-calorie malnutrition ([place]) 08/20/2014  . Pressure ulcer 08/20/2014  . CAP (community acquired pneumonia) 08/19/2014  . Tobacco use disorder 08/19/2014  . CKD (chronic kidney disease) stage 4, GFR 15-29 ml/min ([facility]) 08/19/2014  . Occult blood in stools 01/31/2014  . Anemia 01/31/2014  . Benign neoplasm of transverse colon 01/31/2014  . Hematuria 01/07/2014  . Leukocytes in urine 01/07/2014  . Acute bronchitis 01/07/2014  . Back pain 01/07/2014  . 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 HTN (hypertension) 10/21/2013  . Generalized anxiety disorder 10/21/2013  . Gout 10/21/2013  . GERD (gastroesophageal reflux disease) 10/21/2013    Marissa Cruz 01/10/2017, 12:49 PM Theone Murdoch, OTR/L [phone] Phone: [phone] 12:49 PM 01/10/17 [facility] [address] [place], [place], [address] Phone: [phone]   Fax:  [phone]  Name: Marissa Cruz MRN: [id number] Date of Birth: [birth year]

## 2017-01-30 ENCOUNTER — Other Ambulatory Visit: Payer: Self-pay | Admitting: *Deleted

## 2017-01-30 ENCOUNTER — Ambulatory Visit (HOSPITAL_BASED_OUTPATIENT_CLINIC_OR_DEPARTMENT_OTHER): Payer: Medicare Other

## 2017-01-30 ENCOUNTER — Other Ambulatory Visit (HOSPITAL_BASED_OUTPATIENT_CLINIC_OR_DEPARTMENT_OTHER): Payer: Medicare Other

## 2017-01-30 ENCOUNTER — Ambulatory Visit: Payer: Medicare Other

## 2017-01-30 ENCOUNTER — Ambulatory Visit (HOSPITAL_BASED_OUTPATIENT_CLINIC_OR_DEPARTMENT_OTHER)
Admission: RE | Admit: 2017-01-30 | Discharge: 2017-01-30 | Disposition: A | Discharge status: Home / Self Care | Payer: Medicare Other | Source: Ambulatory Visit | Attending: Hematology & Oncology | Admitting: Hematology & Oncology

## 2017-01-30 ENCOUNTER — Encounter: Payer: Self-pay | Admitting: Hematology & Oncology

## 2017-01-30 ENCOUNTER — Other Ambulatory Visit: Payer: Self-pay

## 2017-01-30 ENCOUNTER — Ambulatory Visit (HOSPITAL_BASED_OUTPATIENT_CLINIC_OR_DEPARTMENT_OTHER): Payer: Medicare Other | Admitting: Hematology & Oncology

## 2017-01-30 DIAGNOSIS — R7989 Other specified abnormal findings of blood chemistry: Secondary | ICD-10-CM

## 2017-01-30 DIAGNOSIS — R319 Hematuria, unspecified: Secondary | ICD-10-CM

## 2017-01-30 DIAGNOSIS — N183 Chronic kidney disease, stage 3 unspecified: Secondary | ICD-10-CM

## 2017-01-30 DIAGNOSIS — C3431 Malignant neoplasm of lower lobe, right bronchus or lung: Secondary | ICD-10-CM

## 2017-01-30 DIAGNOSIS — R918 Other nonspecific abnormal finding of lung field: Secondary | ICD-10-CM

## 2017-01-30 DIAGNOSIS — N281 Cyst of kidney, acquired: Secondary | ICD-10-CM | POA: Diagnosis not present

## 2017-01-30 DIAGNOSIS — Z5112 Encounter for antineoplastic immunotherapy: Secondary | ICD-10-CM

## 2017-01-30 DIAGNOSIS — R197 Diarrhea, unspecified: Secondary | ICD-10-CM | POA: Diagnosis not present

## 2017-01-30 LAB — CBC WITH DIFFERENTIAL (CANCER CENTER ONLY)
BASO#: 0.2 10*3/uL (ref 0.0–0.2)
BASO%: 1.5 % (ref 0.0–2.0)
EOS ABS: 0.3 10*3/uL (ref 0.0–0.5)
EOS%: 2.8 % (ref 0.0–7.0)
HCT: 32.4 % — ABNORMAL LOW (ref 34.8–46.6)
HGB: 10.6 g/dL — ABNORMAL LOW (ref 11.6–15.9)
LYMPH#: 1.3 10*3/uL (ref 0.9–3.3)
LYMPH%: 12.4 % — AB (ref 14.0–48.0)
MCH: 31.3 pg (ref 26.0–34.0)
MCHC: 32.7 g/dL (ref 32.0–36.0)
MCV: 96 fL (ref 81–101)
MONO#: 1 10*3/uL — AB (ref 0.1–0.9)
MONO%: 9.4 % (ref 0.0–13.0)
NEUT#: 7.9 10*3/uL — ABNORMAL HIGH (ref 1.5–6.5)
NEUT%: 73.9 % (ref 39.6–80.0)
PLATELETS: 330 10*3/uL (ref 145–400)
RBC: 3.39 10*6/uL — ABNORMAL LOW (ref 3.70–5.32)
RDW: 15.9 % — AB (ref 11.1–15.7)
WBC: 10.7 10*3/uL — ABNORMAL HIGH (ref 3.9–10.0)

## 2017-01-30 LAB — URINALYSIS, MICROSCOPIC (CHCC SATELLITE)
BLOOD: NEGATIVE
Bilirubin (Urine): NEGATIVE
GLUCOSE UR: NEGATIVE mg/dL
KETONES: NEGATIVE mg/dL
Nitrite: NEGATIVE
PH: 6 (ref 4.60–8.00)
Specific Gravity, Urine: 1.015 (ref 1.003–1.035)
Urobilinogen, UR: 0.2 mg/dL (ref 0.2–1)

## 2017-01-30 LAB — CMP (CANCER CENTER ONLY)
ALK PHOS: 59 U/L (ref 26–84)
ALT(SGPT): 16 U/L (ref 10–47)
AST: 21 U/L (ref 11–38)
Albumin: 2.8 g/dL — ABNORMAL LOW (ref 3.3–5.5)
BUN: 34 mg/dL — AB (ref 7–22)
CHLORIDE: 102 meq/L (ref 98–108)
CO2: 28 mEq/L (ref 18–33)
Calcium: 9.3 mg/dL (ref 8.0–10.3)
Creat: 2.7 mg/dl — ABNORMAL HIGH (ref 0.6–1.2)
Glucose, Bld: 131 mg/dL — ABNORMAL HIGH (ref 73–118)
POTASSIUM: 4.2 meq/L (ref 3.3–4.7)
Sodium: 140 mEq/L (ref 128–145)
Total Bilirubin: 0.4 mg/dl (ref 0.20–1.60)
Total Protein: 6 g/dL — ABNORMAL LOW (ref 6.4–8.1)

## 2017-01-30 LAB — LACTATE DEHYDROGENASE: LDH: 138 U/L (ref 125–245)

## 2017-01-30 MED ORDER — SODIUM CHLORIDE 0.9% FLUSH
10.0000 mL | INTRAVENOUS | Status: DC | PRN
  Administered 2017-01-30: 10 mL
  Filled 2017-01-30: qty 10

## 2017-01-30 MED ORDER — HEPARIN SOD (PORK) LOCK FLUSH 100 UNIT/ML IV SOLN
500.0000 [IU] | Freq: Once | INTRAVENOUS | Status: AC | PRN
  Administered 2017-01-30: 500 [IU]
  Filled 2017-01-30: qty 5

## 2017-01-30 MED ORDER — SODIUM CHLORIDE 0.9 % IV SOLN
200.0000 mg | Freq: Once | INTRAVENOUS | Status: AC
  Administered 2017-01-30: 200 mg via INTRAVENOUS
  Filled 2017-01-30: qty 8

## 2017-01-30 MED ORDER — SODIUM CHLORIDE 0.9 % IV SOLN
Freq: Once | INTRAVENOUS | Status: AC
  Administered 2017-01-30: 14:00:00 via INTRAVENOUS

## 2017-01-30 NOTE — Progress Notes (Signed)
Hematology and Oncology Follow Up Visit  Marissa Cruz [id number] [birth year] 82 y.o. 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  She has been doing pretty well.  She now has a boot off her right foot.  She and her daughter had a nice Thanksgiving.  They are all at their daughter's house.  They had crab legs.  She has been eating pretty well.  There is been no problems with bowels or bladder.  She has had occasional diarrhea but she does not mind this.  She is afraid of becoming constipated.  She has had no bleeding.  She had no cough or shortness of breath.  Despite all of our efforts, she still does not gain weight.  She has episodes of eating better.  For the most part though, she just nipples.  Overall, her performance status is ECOG 3.   Medications:  Allergies as of 01/30/2017      Reactions   Morphine And Related Other (See Comments)   Hallucinations and combative   Codeine Nausea Only      Medication List        Accurate as of 01/30/17  3:00 PM. Always use your most recent med list.          allopurinol 100 MG tablet Commonly known as:  ZYLOPRIM TAKE 1 TABLET BY MOUTH AT BEDTIME   ALPRAZolam 0.25 MG tablet Commonly known as:  XANAX Take 1 tablet (0.25 mg total) by mouth 3 (three) times daily as needed for anxiety.   amLODipine 5 MG tablet Commonly known as:  NORVASC TAKE 1 TABLET BY MOUTH DAILY FOR HIGH BLOOD PRESSURE   aspirin 81 MG tablet Take 81 mg by mouth daily.   cetirizine 10 MG tablet Commonly known as:  ZYRTEC Take 10 mg by mouth at bedtime.   diclofenac sodium 1 % Gel Commonly known as:  VOLTAREN Apply 2 g topically 4  (four) times daily.   docusate sodium 100 MG capsule Commonly known as:  COLACE Take 100 mg by mouth 2 (two) times daily.   ferrous sulfate 325 (65 FE) MG EC tablet Take 325 mg by mouth 2 (two) times daily.   HM MULTIVITAMIN ADULT GUMMY Chew Chew 2 tablets by mouth every morning.   lidocaine-prilocaine cream Commonly known as:  EMLA Apply to affected area once   LORazepam 0.5 MG tablet Commonly known as:  ATIVAN Take 1 tablet (0.5 mg total) by mouth every 6 (six) hours as needed (Nausea or vomiting).   megestrol 40 MG/ML suspension Commonly known as:  MEGACE TAKE 10 ML BY MOUTH DAILY   metoprolol succinate 25 MG 24 hr tablet Commonly known as:  TOPROL-XL TAKE 1 TABLET(25 MG) BY MOUTH EVERY MORNING   mirtazapine 15 MG disintegrating tablet Commonly known as:  REMERON SOL-TAB DISSOLVE 1 TABLET ON THE TONGUE AT BEDTIME   nicotine 14 mg/24hr patch Commonly known as:  NICODERM CQ - dosed in mg/24 hours Place 14 mg onto the skin daily.   omeprazole 20 MG capsule Commonly known as:  PRILOSEC TAKE 1 CAPSULE BY MOUTH TWICE DAILY BEFORE A MEAL   ondansetron 8 MG disintegrating tablet Commonly known as:  ZOFRAN-ODT Take 1 tablet (8 mg total) by mouth every 8 (eight) hours as needed for nausea or vomiting. Reported on 05/17/2015   oxandrolone 10 MG tablet Commonly known as:  OXANDRIN Take 1 tablet (10 mg total) by mouth daily with breakfast.   PARoxetine 10 MG tablet Commonly known as:  PAXIL TAKE 1 TABLET BY MOUTH DAILY FOR DEPRESSION   [name] 0.625 MG tablet Generic drug:  estrogens (conjugated) TAKE 1 TABLET BY MOUTH EVERY DAY FOR 21 DAYS. DO NOT TAKE FOR 7 DAYS   traMADol 50 MG tablet Commonly known as:  ULTRAM Take 1 tablet (50 mg total) by mouth every 6 (six) hours as needed.   traZODone 50 MG tablet Commonly known as:  DESYREL TAKE 1 TABLET(50 MG) BY MOUTH AT BEDTIME       Allergies:  Allergies  Allergen Reactions  . Morphine And Related Other (See  Comments)    Hallucinations and combative  . Codeine Nausea Only    Past Medical History, Surgical history, Social history, and Family History were reviewed and updated.  Review of Systems: As stated above in the interim history  Physical Exam:  vitals were not taken for this visit.   Wt Readings from Last 3 Encounters:  01/30/17 95 lb (43.1 kg)  01/02/17 96 lb (43.5 kg)  12/24/16 95 lb (43.1 kg)   Physical Exam  Constitutional: She is oriented to person, place, and time.  HENT:  Head: Normocephalic and atraumatic.  Mouth/Throat: Oropharynx is clear and moist.  Eyes: EOM are normal. Pupils are equal, round, and reactive to light.  Neck: Normal range of motion.  Cardiovascular: Normal rate, regular rhythm and normal heart sounds.  Pulmonary/Chest: Effort normal and breath sounds normal.  Abdominal: Soft. Bowel sounds are normal.  Musculoskeletal: Normal range of motion. She exhibits no edema, tenderness or deformity.  She does have a walking boot on the right lower leg.  Lymphadenopathy:    She has no cervical adenopathy.  Neurological: She is alert and oriented to person, place, and time.  Skin: Skin is warm and dry. No rash noted. No erythema.  Psychiatric: She has a normal mood and affect. Her behavior is normal. Judgment and thought content normal.  Vitals reviewed.    Lab Results  Component Value Date   WBC 10.7 (H) 01/30/2017   HGB 10.6 (L) 01/30/2017   HCT 32.4 (L) 01/30/2017   MCV 96 01/30/2017   PLT 330 01/30/2017   Lab Results  Component Value Date   FERRITIN 276 (H) 01/02/2017   IRON 40 (L) 01/02/2017   TIBC 230 (L) 01/02/2017   UIBC 190 01/02/2017   IRONPCTSAT 18 (L) 01/02/2017   Lab Results  Component Value Date   RBC 3.39 (L) 01/30/2017   No results found for: KPAFRELGTCHN, LAMBDASER, KAPLAMBRATIO No results found for: IGGSERUM, IGA, IGMSERUM No results found for: Odetta Pink, SPEI    Chemistry      Component Value Date/Time   NA 140 01/30/2017 1138   NA 136 03/07/2016 1055   K 4.2 01/30/2017 1138   K 4.1 03/07/2016 1055   CL 102 01/30/2017 1138   CO2 28 01/30/2017 1138   CO2 26 03/07/2016 1055   BUN 34 (H) 01/30/2017 1138   BUN 28.7 (H) 03/07/2016 1055   CREATININE 2.7 (H) 01/30/2017 1138   CREATININE 1.3 (H) 03/07/2016 1055      Component Value Date/Time   CALCIUM 9.3 01/30/2017 1138   CALCIUM 9.5  03/07/2016 1055   ALKPHOS 59 01/30/2017 1138   ALKPHOS 83 03/07/2016 1055   AST 21 01/30/2017 1138   AST 17 03/07/2016 1055   ALT 16 01/30/2017 1138   ALT 9 03/07/2016 1055   BILITOT 0.40 01/30/2017 1138   BILITOT 0.26 03/07/2016 1055      Impression and Plan: Marissa Cruz is a 82 year old white female. She has locally advanced, inoperable, stage IIIB squamous cell carcinoma of the right lung. Thankfully, she does have a high PD-L1 score.  I am really bothered by the fact that her creatinine is worse.  I worried that this might be a side effect of the Keytruda.  This does happen in about 10% of patients.  I did get a sonogram of her kidneys today.   I try to get a urinalysis.  This hopefully will give [place] an idea as to what might be the source of the elevated creatinine.  Nephritis can occur in 2% of patients.  I think we probably need to hold on the [facility] after this does.  Hopefully, any response that we have seen will be long lasting.  I spent about 30 minutes with she and her daughter.  I talked about the elevated creatinine and the fact that I do not want to see her run into issues with renal failure.  Hopefully, we will be able to hold on her treatment for several months.  She is not a candidate for any chemotherapy.  I suppose we might be able to utilize a another immunotherapy medication.  I would like to get a PET scan on her as a "baseline".  I will get this in about for 5 weeks.  I will see her back in about 6 weeks or so.  We will get her  through the holidays.    Volanda Napoleon, MD 11/29/20183:00 PM

## 2017-01-31 ENCOUNTER — Other Ambulatory Visit: Payer: Self-pay | Admitting: Family Medicine

## 2017-02-02 LAB — URINE CULTURE

## 2017-02-03 ENCOUNTER — Telehealth: Payer: Self-pay | Admitting: *Deleted

## 2017-02-03 MED ORDER — AMOXICILLIN-POT CLAVULANATE 875-125 MG PO TABS: 1.0000 | ORAL_TABLET | Freq: Two times a day (BID) | ORAL | 0 refills | Status: DC

## 2017-02-03 NOTE — Telephone Encounter (Signed)
-----   Message from Volanda Napoleon, MD sent at 02/03/2017  9:16 AM EST ----- Call her dgtr - (+) UTI .  Please call in Augmentin 875 mg po BID x 5 days.  Laurey Arrow

## 2017-02-04 ENCOUNTER — Other Ambulatory Visit: Payer: Self-pay | Admitting: Family Medicine

## 2017-02-06 ENCOUNTER — Telehealth: Payer: Self-pay | Admitting: *Deleted

## 2017-02-06 MED ORDER — NITROFURANTOIN MONOHYD MACRO 100 MG PO CAPS: 100.0000 mg | ORAL_CAPSULE | Freq: Two times a day (BID) | ORAL | 0 refills | Status: AC

## 2017-02-06 NOTE — Telephone Encounter (Signed)
Patient was prescribed augmentin three times daily on 02/03/17. Patient's daughter states she is having problems with nausea and diarrhea. She would like the medication changed.  Spoke with Dr Marin Olp. He would like the patient switched to macrobid. Daughter is aware of the switch and pharmacy confirmed.

## 2017-02-26 ENCOUNTER — Other Ambulatory Visit: Payer: Self-pay | Admitting: Family Medicine

## 2017-02-27 ENCOUNTER — Other Ambulatory Visit: Payer: Medicare Other

## 2017-02-27 ENCOUNTER — Ambulatory Visit: Payer: Medicare Other

## 2017-02-27 ENCOUNTER — Ambulatory Visit: Payer: Medicare Other | Admitting: Hematology & Oncology

## 2017-03-05 ENCOUNTER — Other Ambulatory Visit: Payer: Self-pay | Admitting: Family Medicine

## 2017-03-06 ENCOUNTER — Ambulatory Visit (HOSPITAL_COMMUNITY)
Admission: RE | Admit: 2017-03-06 | Discharge: 2017-03-06 | Disposition: A | Discharge status: Home / Self Care | Payer: Medicare Other | Source: Ambulatory Visit | Attending: Hematology & Oncology | Admitting: Hematology & Oncology

## 2017-03-06 DIAGNOSIS — M4856XA Collapsed vertebra, not elsewhere classified, lumbar region, initial encounter for fracture: Secondary | ICD-10-CM | POA: Diagnosis not present

## 2017-03-06 DIAGNOSIS — M47896 Other spondylosis, lumbar region: Secondary | ICD-10-CM | POA: Diagnosis not present

## 2017-03-06 DIAGNOSIS — I2510 Atherosclerotic heart disease of native coronary artery without angina pectoris: Secondary | ICD-10-CM | POA: Diagnosis not present

## 2017-03-06 DIAGNOSIS — M5136 Other intervertebral disc degeneration, lumbar region: Secondary | ICD-10-CM | POA: Diagnosis not present

## 2017-03-06 DIAGNOSIS — N183 Chronic kidney disease, stage 3 unspecified: Secondary | ICD-10-CM

## 2017-03-06 DIAGNOSIS — R918 Other nonspecific abnormal finding of lung field: Secondary | ICD-10-CM | POA: Diagnosis not present

## 2017-03-06 DIAGNOSIS — C3490 Malignant neoplasm of unspecified part of unspecified bronchus or lung: Secondary | ICD-10-CM | POA: Diagnosis not present

## 2017-03-06 DIAGNOSIS — R590 Localized enlarged lymph nodes: Secondary | ICD-10-CM | POA: Diagnosis not present

## 2017-03-06 DIAGNOSIS — K8020 Calculus of gallbladder without cholecystitis without obstruction: Secondary | ICD-10-CM | POA: Diagnosis not present

## 2017-03-06 DIAGNOSIS — I700 Atherosclerosis of aorta: Secondary | ICD-10-CM | POA: Diagnosis not present

## 2017-03-06 LAB — GLUCOSE, CAPILLARY: GLUCOSE-CAPILLARY: 70 mg/dL (ref 65–99)

## 2017-03-06 MED ORDER — FLUDEOXYGLUCOSE F - 18 (FDG) INJECTION
4.9000 | Freq: Once | INTRAVENOUS | Status: AC | PRN
  Administered 2017-03-06: 4.9 via INTRAVENOUS

## 2017-03-07 ENCOUNTER — Telehealth: Payer: Self-pay | Admitting: *Deleted

## 2017-03-07 NOTE — Telephone Encounter (Addendum)
Daughter is aware of results  ----- Message from Volanda Napoleon, MD sent at 03/06/2017  6:37 PM EST ----- Call her dgtr - everything looks pretty stable!!!  Happy New Year!!  Laurey Arrow

## 2017-03-13 ENCOUNTER — Other Ambulatory Visit: Payer: Self-pay | Admitting: Family Medicine

## 2017-03-20 ENCOUNTER — Inpatient Hospital Stay (HOSPITAL_BASED_OUTPATIENT_CLINIC_OR_DEPARTMENT_OTHER): Payer: Medicare Other | Admitting: Hematology & Oncology

## 2017-03-20 ENCOUNTER — Inpatient Hospital Stay: Payer: Medicare Other

## 2017-03-20 ENCOUNTER — Inpatient Hospital Stay: Payer: Medicare Other | Attending: Hematology & Oncology

## 2017-03-20 ENCOUNTER — Other Ambulatory Visit: Payer: Self-pay | Admitting: Hematology & Oncology

## 2017-03-20 VITALS — BP 152/70 | HR 107 | Temp 98.0°F | Resp 17 | Wt 90.0 lb

## 2017-03-20 DIAGNOSIS — N183 Chronic kidney disease, stage 3 unspecified: Secondary | ICD-10-CM

## 2017-03-20 DIAGNOSIS — C3431 Malignant neoplasm of lower lobe, right bronchus or lung: Secondary | ICD-10-CM

## 2017-03-20 DIAGNOSIS — R197 Diarrhea, unspecified: Secondary | ICD-10-CM | POA: Insufficient documentation

## 2017-03-20 DIAGNOSIS — R634 Abnormal weight loss: Secondary | ICD-10-CM | POA: Diagnosis not present

## 2017-03-20 DIAGNOSIS — R918 Other nonspecific abnormal finding of lung field: Secondary | ICD-10-CM

## 2017-03-20 DIAGNOSIS — C3491 Malignant neoplasm of unspecified part of right bronchus or lung: Secondary | ICD-10-CM | POA: Insufficient documentation

## 2017-03-20 LAB — CBC WITH DIFFERENTIAL (CANCER CENTER ONLY)
BASOS PCT: 1 %
Basophils Absolute: 0.1 10*3/uL (ref 0.0–0.1)
EOS PCT: 3 %
Eosinophils Absolute: 0.4 10*3/uL (ref 0.0–0.5)
HCT: 33.2 % — ABNORMAL LOW (ref 34.8–46.6)
HEMOGLOBIN: 10.7 g/dL — AB (ref 11.6–15.9)
Lymphocytes Relative: 16 %
Lymphs Abs: 2.2 10*3/uL (ref 0.9–3.3)
MCH: 31 pg (ref 26.0–34.0)
MCHC: 32.2 g/dL (ref 32.0–36.0)
MCV: 96.2 fL (ref 81.0–101.0)
MONO ABS: 1.5 10*3/uL — AB (ref 0.1–0.9)
MONOS PCT: 11 %
Neutro Abs: 9.3 10*3/uL — ABNORMAL HIGH (ref 1.5–6.5)
Neutrophils Relative %: 69 %
Platelet Count: 401 10*3/uL — ABNORMAL HIGH (ref 145–400)
RBC: 3.45 MIL/uL — ABNORMAL LOW (ref 3.70–5.32)
RDW: 16.7 % — ABNORMAL HIGH (ref 11.1–15.7)
WBC Count: 13.5 10*3/uL — ABNORMAL HIGH (ref 3.9–10.3)

## 2017-03-20 LAB — CMP (CANCER CENTER ONLY)
ALT: 17 U/L (ref 0–55)
ANION GAP: 11 (ref 5–15)
AST: 16 U/L (ref 5–34)
Albumin: 2.8 g/dL — ABNORMAL LOW (ref 3.5–5.0)
Alkaline Phosphatase: 88 U/L — ABNORMAL HIGH (ref 26–84)
BILIRUBIN TOTAL: 0.4 mg/dL (ref 0.2–1.2)
BUN: 31 mg/dL — AB (ref 7–22)
CHLORIDE: 109 mmol/L — AB (ref 98–108)
CO2: 24 mmol/L (ref 18–33)
Calcium: 8.3 mg/dL (ref 8.0–10.3)
Creatinine: 2.1 mg/dL — ABNORMAL HIGH (ref 0.60–1.10)
Glucose, Bld: 93 mg/dL (ref 73–118)
POTASSIUM: 4 mmol/L (ref 3.5–5.1)
SODIUM: 144 mmol/L (ref 128–145)
Total Protein: 6.4 g/dL (ref 6.4–8.1)

## 2017-03-20 LAB — LACTATE DEHYDROGENASE: LDH: 135 U/L (ref 125–245)

## 2017-03-20 MED ORDER — HEPARIN SOD (PORK) LOCK FLUSH 100 UNIT/ML IV SOLN
500.0000 [IU] | Freq: Once | INTRAVENOUS | Status: AC
  Administered 2017-03-20: 500 [IU] via INTRAVENOUS
  Filled 2017-03-20: qty 5

## 2017-03-20 MED ORDER — SODIUM CHLORIDE 0.9% FLUSH
10.0000 mL | INTRAVENOUS | Status: AC | PRN
  Administered 2017-03-20: 10 mL via INTRAVENOUS
  Filled 2017-03-20: qty 10

## 2017-03-20 MED ORDER — DIPHENOXYLATE-ATROPINE 2.5-0.025 MG PO TABS: 1.0000 | ORAL_TABLET | Freq: Four times a day (QID) | ORAL | 0 refills | Status: DC | PRN

## 2017-03-20 MED ORDER — PANCRELIPASE (LIP-PROT-AMYL) 36000-114000 UNITS PO CPEP: 36000.0000 [IU] | ORAL_CAPSULE | Freq: Three times a day (TID) | ORAL | 4 refills | Status: DC

## 2017-03-20 NOTE — Patient Instructions (Signed)
Implanted Port Home Guide An implanted port is a type of central line that is placed under the skin. Central lines are used to provide IV access when treatment or nutrition needs to be given through a person's veins. Implanted ports are used for long-term IV access. An implanted port may be placed because:  You need IV medicine that would be irritating to the small veins in your hands or arms.  You need long-term IV medicines, such as antibiotics.  You need IV nutrition for a long period.  You need frequent blood draws for lab tests.  You need dialysis.  Implanted ports are usually placed in the chest area, but they can also be placed in the upper arm, the abdomen, or the leg. An implanted port has two main parts:  Reservoir. The reservoir is round and will appear as a small, raised area under your skin. The reservoir is the part where a needle is inserted to give medicines or draw blood.  Catheter. The catheter is a thin, flexible tube that extends from the reservoir. The catheter is placed into a large vein. Medicine that is inserted into the reservoir goes into the catheter and then into the vein.  How will I care for my incision site? Do not get the incision site wet. Bathe or shower as directed by your health care provider. How is my port accessed? Special steps must be taken to access the port:  Before the port is accessed, a numbing cream can be placed on the skin. This helps numb the skin over the port site.  Your health care provider uses a sterile technique to access the port. ? Your health care provider must put on a mask and sterile gloves. ? The skin over your port is cleaned carefully with an antiseptic and allowed to dry. ? The port is gently pinched between sterile gloves, and a needle is inserted into the port.  Only "non-coring" port needles should be used to access the port. Once the port is accessed, a blood return should be checked. This helps ensure that the port  is in the vein and is not clogged.  If your port needs to remain accessed for a constant infusion, a clear (transparent) bandage will be placed over the needle site. The bandage and needle will need to be changed every week, or as directed by your health care provider.  Keep the bandage covering the needle clean and dry. Do not get it wet. Follow your health care provider's instructions on how to take a shower or bath while the port is accessed.  If your port does not need to stay accessed, no bandage is needed over the port.  What is flushing? Flushing helps keep the port from getting clogged. Follow your health care provider's instructions on how and when to flush the port. Ports are usually flushed with saline solution or a medicine called heparin. The need for flushing will depend on how the port is used.  If the port is used for intermittent medicines or blood draws, the port will need to be flushed: ? After medicines have been given. ? After blood has been drawn. ? As part of routine maintenance.  If a constant infusion is running, the port may not need to be flushed.  How long will my port stay implanted? The port can stay in for as long as your health care provider thinks it is needed. When it is time for the port to come out, surgery will be   done to remove it. The procedure is similar to the one performed when the port was put in. When should I seek immediate medical care? When you have an implanted port, you should seek immediate medical care if:  You notice a bad smell coming from the incision site.  You have swelling, redness, or drainage at the incision site.  You have more swelling or pain at the port site or the surrounding area.  You have a fever that is not controlled with medicine.  This information is not intended to replace advice given to you by your health care provider. Make sure you discuss any questions you have with your health care provider. Document  Released: 02/18/2005 Document Revised: 07/27/2015 Document Reviewed: 10/26/2012 [employer] Interactive Patient Education  2017 [employer]  

## 2017-03-20 NOTE — Progress Notes (Signed)
Hematology and Oncology Follow Up Visit  Marissa Cruz [id number] 01/07/1935 83 y.o. 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 is back for follow-up.  Unfortunately, she is not doing as well.  She has lost 5 pounds since we last saw her.  She is having problems with some diarrhea.  I am not sure why she has this.  I would be surprised it was from the [facility].  She has had it treated for quite a while.  She just says she has one episode a day..  She has been on Imodium.  I will try her on some Lomotil.  She also want to try her on some Creon.  Maybe this can help her with some digestion.  Might be a little bit of nausea but she has not had no vomiting.  There is no bleeding with the diarrhea.  She has had no fever.  She has had no cough.  We last saw her, she had a enterococcus urinary tract infection.  She was treated with oral antibiotics for this.  She was having some issues with renal insufficiency when we last saw her.  I still do not have the labs back from today.  She did have a PET scan done a couple weeks ago.  Everything basically looks stable.  I do not see anything that looks new.   Overall, her performance status is ECOG 3.   Medications:  Allergies as of 03/20/2017      Reactions   Morphine And Related Other (See Comments)   Hallucinations and combative   Codeine Nausea Only      Medication List        Accurate as of 03/20/17 11:21 AM. Always use your most recent med list.          allopurinol 100 MG tablet Commonly known as:  ZYLOPRIM TAKE 1 TABLET BY MOUTH AT BEDTIME   ALPRAZolam 0.25 MG tablet Commonly known as:  XANAX Take 1 tablet (0.25 mg total) by  mouth 3 (three) times daily as needed for anxiety.   amLODipine 5 MG tablet Commonly known as:  NORVASC TAKE 1 TABLET BY MOUTH DAILY FOR HIGH BLOOD PRESSURE   aspirin 81 MG tablet Take 81 mg by mouth daily.   cetirizine 10 MG tablet Commonly known as:  ZYRTEC Take 10 mg by mouth at bedtime.   docusate sodium 100 MG capsule Commonly known as:  COLACE Take 100 mg by mouth 2 (two) times daily.   ferrous sulfate 325 (65 FE) MG EC tablet Take 325 mg by mouth 2 (two) times daily.   HM MULTIVITAMIN ADULT GUMMY Chew Chew 2 tablets by mouth every morning.   lidocaine-prilocaine cream Commonly known as:  EMLA Apply to affected area once   LORazepam 0.5 MG tablet Commonly known as:  ATIVAN Take 1 tablet (0.5 mg total) by mouth every 6 (six) hours as needed (Nausea or vomiting).   megestrol 40 MG/ML suspension Commonly known as:  MEGACE TAKE 10 ML BY MOUTH DAILY   metoprolol succinate 25 MG 24 hr tablet Commonly known as:  TOPROL-XL TAKE 1 TABLET(25 MG) BY MOUTH EVERY MORNING   mirtazapine 15 MG disintegrating tablet Commonly known  as:  REMERON SOL-TAB Dissolve 1 tablet under the tongue at bedtime daily   nicotine 14 mg/24hr patch Commonly known as:  NICODERM CQ - dosed in mg/24 hours Place 14 mg onto the skin daily.   omeprazole 20 MG capsule Commonly known as:  PRILOSEC TAKE 1 CAPSULE BY MOUTH TWICE DAILY BEFORE A MEAL   ondansetron 8 MG disintegrating tablet Commonly known as:  ZOFRAN-ODT Take 1 tablet (8 mg total) by mouth every 8 (eight) hours as needed for nausea or vomiting. Reported on 05/17/2015   oxandrolone 10 MG tablet Commonly known as:  OXANDRIN Take 1 tablet (10 mg total) by mouth daily with breakfast.   PARoxetine 10 MG tablet Commonly known as:  PAXIL TAKE 1 TABLET BY MOUTH DAILY FOR DEPRESSION   [name] 0.625 MG tablet Generic drug:  estrogens (conjugated) TAKE 1 TABLET BY MOUTH EVERY DAY FOR 21 DAYS. DO NOT TAKE FOR 7 DAYS   traMADol 50 MG  tablet Commonly known as:  ULTRAM Take 1 tablet (50 mg total) by mouth every 6 (six) hours as needed.   traZODone 50 MG tablet Commonly known as:  DESYREL TAKE 1 TABLET BY MOUTH AT BEDTIME       Allergies:  Allergies  Allergen Reactions  . Morphine And Related Other (See Comments)    Hallucinations and combative  . Codeine Nausea Only    Past Medical History, Surgical history, Social history, and Family History were reviewed and updated.  Review of Systems: As stated above in the interim history  Physical Exam:  weight is 90 lb (40.8 kg). Her oral temperature is 98 F (36.7 C). Her blood pressure is 152/70 (abnormal) and her pulse is 107 (abnormal). Her respiration is 17 and oxygen saturation is 98%.   Wt Readings from Last 3 Encounters:  03/20/17 90 lb (40.8 kg)  01/30/17 95 lb (43.1 kg)  01/02/17 96 lb (43.5 kg)   Physical Exam  Constitutional: She is oriented to person, place, and time.  HENT:  Head: Normocephalic and atraumatic.  Mouth/Throat: Oropharynx is clear and moist.  Eyes: EOM are normal. Pupils are equal, round, and reactive to light.  Neck: Normal range of motion.  Cardiovascular: Normal rate, regular rhythm and normal heart sounds.  Pulmonary/Chest: Effort normal and breath sounds normal.  Abdominal: Soft. Bowel sounds are normal.  Musculoskeletal: Normal range of motion. She exhibits no edema, tenderness or deformity.  She does have a walking boot on the right lower leg.  Lymphadenopathy:    She has no cervical adenopathy.  Neurological: She is alert and oriented to person, place, and time.  Skin: Skin is warm and dry. No rash noted. No erythema.  Psychiatric: She has a normal mood and affect. Her behavior is normal. Judgment and thought content normal.  Vitals reviewed.    Lab Results  Component Value Date   WBC 10.7 (H) 01/30/2017   HGB 10.6 (L) 01/30/2017   HCT 32.4 (L) 01/30/2017   MCV 96 01/30/2017   PLT 330 01/30/2017   Lab Results    Component Value Date   FERRITIN 276 (H) 01/02/2017   IRON 40 (L) 01/02/2017   TIBC 230 (L) 01/02/2017   UIBC 190 01/02/2017   IRONPCTSAT 18 (L) 01/02/2017   Lab Results  Component Value Date   RBC 3.39 (L) 01/30/2017   No results found for: KPAFRELGTCHN, LAMBDASER, KAPLAMBRATIO No results found for: IGGSERUM, IGA, IGMSERUM No results found for: TOTALPROTELP, ALBUMINELP, A1GS, A2GS, BETS, BETA2SER, [place], MSPIKE, SPEI  Chemistry      Component Value Date/Time   NA 140 01/30/2017 1138   NA 136 03/07/2016 1055   K 4.2 01/30/2017 1138   K 4.1 03/07/2016 1055   CL 102 01/30/2017 1138   CO2 28 01/30/2017 1138   CO2 26 03/07/2016 1055   BUN 34 (H) 01/30/2017 1138   BUN 28.7 (H) 03/07/2016 1055   CREATININE 2.7 (H) 01/30/2017 1138   CREATININE 1.3 (H) 03/07/2016 1055      Component Value Date/Time   CALCIUM 9.3 01/30/2017 1138   CALCIUM 9.5 03/07/2016 1055   ALKPHOS 59 01/30/2017 1138   ALKPHOS 83 03/07/2016 1055   AST 21 01/30/2017 1138   AST 17 03/07/2016 1055   ALT 16 01/30/2017 1138   ALT 9 03/07/2016 1055   BILITOT 0.40 01/30/2017 1138   BILITOT 0.26 03/07/2016 1055      Impression and Plan: Ms. Hulgan is a 83 year old white female. She has locally advanced, inoperable, stage IIIB squamous cell carcinoma of the right lung. Thankfully, she does have a high PD-L1 score.  I still am not sure that the Beryle Flock is causing any of this diarrhea.  Is possible that 1 of her medications could be doing this.  She is on quite a few medications.  Hopefully, the Lomotil will help.  Hopefully, the Creon will help.  .  We will continue to hold her Keytruda.  I just do not see that her lung cancer is causing her problems right now.  The weight loss is just a huge issue for me.  I really would like to see her improve with this.  We will get her back to see [place] in a month.  Volanda Napoleon, MD 1/17/201911:21 AM

## 2017-03-20 NOTE — Addendum Note (Signed)
Addended by: Johny Drilling on: 03/20/2017 11:55 AM   Modules accepted: Orders, SmartSet

## 2017-03-23 ENCOUNTER — Other Ambulatory Visit: Payer: Self-pay | Admitting: Family Medicine

## 2017-03-25 DIAGNOSIS — H43813 Vitreous degeneration, bilateral: Secondary | ICD-10-CM | POA: Diagnosis not present

## 2017-03-25 DIAGNOSIS — H353232 Exudative age-related macular degeneration, bilateral, with inactive choroidal neovascularization: Secondary | ICD-10-CM | POA: Diagnosis not present

## 2017-03-30 ENCOUNTER — Other Ambulatory Visit: Payer: Self-pay | Admitting: Hematology & Oncology

## 2017-03-30 DIAGNOSIS — C3431 Malignant neoplasm of lower lobe, right bronchus or lung: Secondary | ICD-10-CM

## 2017-04-01 ENCOUNTER — Ambulatory Visit: Payer: Medicare Other | Admitting: Podiatry

## 2017-04-17 ENCOUNTER — Inpatient Hospital Stay: Payer: Medicare Other

## 2017-04-17 ENCOUNTER — Inpatient Hospital Stay: Payer: Medicare Other | Attending: Hematology & Oncology | Admitting: Hematology & Oncology

## 2017-04-17 ENCOUNTER — Other Ambulatory Visit: Payer: Self-pay

## 2017-04-17 VITALS — BP 157/60 | HR 97 | Temp 98.1°F | Resp 18 | Wt 94.0 lb

## 2017-04-17 DIAGNOSIS — C3431 Malignant neoplasm of lower lobe, right bronchus or lung: Secondary | ICD-10-CM

## 2017-04-17 DIAGNOSIS — Z95828 Presence of other vascular implants and grafts: Secondary | ICD-10-CM

## 2017-04-17 DIAGNOSIS — N184 Chronic kidney disease, stage 4 (severe): Secondary | ICD-10-CM

## 2017-04-17 DIAGNOSIS — D509 Iron deficiency anemia, unspecified: Secondary | ICD-10-CM | POA: Diagnosis not present

## 2017-04-17 DIAGNOSIS — R197 Diarrhea, unspecified: Secondary | ICD-10-CM | POA: Diagnosis not present

## 2017-04-17 DIAGNOSIS — D500 Iron deficiency anemia secondary to blood loss (chronic): Secondary | ICD-10-CM

## 2017-04-17 DIAGNOSIS — K909 Intestinal malabsorption, unspecified: Secondary | ICD-10-CM | POA: Diagnosis not present

## 2017-04-17 LAB — URINALYSIS, COMPLETE (UACMP) WITH MICROSCOPIC
BILIRUBIN URINE: NEGATIVE
GLUCOSE, UA: NEGATIVE mg/dL
Hgb urine dipstick: NEGATIVE
Ketones, ur: NEGATIVE mg/dL
Nitrite: NEGATIVE
PROTEIN: NEGATIVE mg/dL
Specific Gravity, Urine: 1.02 (ref 1.005–1.030)
pH: 6 (ref 5.0–8.0)

## 2017-04-17 LAB — CMP (CANCER CENTER ONLY)
ALT: 12 U/L (ref 10–47)
AST: 19 U/L (ref 11–38)
Albumin: 2.6 g/dL — ABNORMAL LOW (ref 3.5–5.0)
Alkaline Phosphatase: 70 U/L (ref 26–84)
Anion gap: 5 (ref 5–15)
BUN: 45 mg/dL — AB (ref 7–22)
CALCIUM: 8.8 mg/dL (ref 8.0–10.3)
CO2: 23 mmol/L (ref 18–33)
Chloride: 115 mmol/L — ABNORMAL HIGH (ref 98–108)
Creatinine: 1.9 mg/dL — ABNORMAL HIGH (ref 0.60–1.20)
GLUCOSE: 116 mg/dL (ref 73–118)
Potassium: 4.1 mmol/L (ref 3.3–4.7)
SODIUM: 143 mmol/L (ref 128–145)
TOTAL PROTEIN: 5.5 g/dL — AB (ref 6.4–8.1)
Total Bilirubin: 0.5 mg/dL (ref 0.2–1.6)

## 2017-04-17 LAB — CBC WITH DIFFERENTIAL (CANCER CENTER ONLY)
Basophils Absolute: 0.1 10*3/uL (ref 0.0–0.1)
Basophils Relative: 1 %
EOS ABS: 0.4 10*3/uL (ref 0.0–0.5)
EOS PCT: 3 %
HCT: 34.1 % — ABNORMAL LOW (ref 34.8–46.6)
Hemoglobin: 11 g/dL — ABNORMAL LOW (ref 11.6–15.9)
LYMPHS PCT: 16 %
Lymphs Abs: 2.1 10*3/uL (ref 0.9–3.3)
MCH: 32 pg (ref 26.0–34.0)
MCHC: 32.3 g/dL (ref 32.0–36.0)
MCV: 99.1 fL (ref 81.0–101.0)
Monocytes Absolute: 1.1 10*3/uL — ABNORMAL HIGH (ref 0.1–0.9)
Monocytes Relative: 9 %
NEUTROS PCT: 71 %
Neutro Abs: 9.1 10*3/uL — ABNORMAL HIGH (ref 1.5–6.5)
PLATELETS: 386 10*3/uL (ref 145–400)
RBC: 3.44 MIL/uL — AB (ref 3.70–5.32)
RDW: 17.9 % — ABNORMAL HIGH (ref 11.1–15.7)
WBC: 12.9 10*3/uL — AB (ref 3.9–10.0)

## 2017-04-17 MED ORDER — SODIUM CHLORIDE 0.9% FLUSH
10.0000 mL | INTRAVENOUS | Status: DC | PRN
  Filled 2017-04-17: qty 10

## 2017-04-17 MED ORDER — ALTEPLASE 2 MG IJ SOLR
2.0000 mg | Freq: Once | INTRAMUSCULAR | Status: DC | PRN
  Filled 2017-04-17: qty 2

## 2017-04-17 MED ORDER — SODIUM CHLORIDE 0.9% FLUSH
10.0000 mL | INTRAVENOUS | Status: DC | PRN
  Administered 2017-04-17: 10 mL via INTRAVENOUS
  Filled 2017-04-17: qty 10

## 2017-04-17 MED ORDER — SODIUM CHLORIDE 0.9% FLUSH
3.0000 mL | Freq: Once | INTRAVENOUS | Status: DC | PRN
  Filled 2017-04-17: qty 10

## 2017-04-17 MED ORDER — PANTOPRAZOLE SODIUM 40 MG IV SOLR: 40.0000 mg | Freq: Once | INTRAVENOUS | Status: DC

## 2017-04-17 MED ORDER — HEPARIN SOD (PORK) LOCK FLUSH 100 UNIT/ML IV SOLN
500.0000 [IU] | Freq: Once | INTRAVENOUS | Status: DC | PRN
  Filled 2017-04-17: qty 5

## 2017-04-17 MED ORDER — SODIUM CHLORIDE 0.9 % IV SOLN
INTRAVENOUS | Status: DC
  Administered 2017-04-17: 13:00:00 via INTRAVENOUS

## 2017-04-17 MED ORDER — HEPARIN SOD (PORK) LOCK FLUSH 100 UNIT/ML IV SOLN
250.0000 [IU] | Freq: Once | INTRAVENOUS | Status: DC | PRN
  Filled 2017-04-17: qty 5

## 2017-04-17 MED ORDER — PB-HYOSCY-ATROPINE-SCOPOLAMINE 16.2 MG/5ML PO ELIX: 5.0000 mL | ORAL_SOLUTION | Freq: Three times a day (TID) | ORAL | 1 refills | Status: DC

## 2017-04-17 MED ORDER — HEPARIN SOD (PORK) LOCK FLUSH 100 UNIT/ML IV SOLN
500.0000 [IU] | Freq: Once | INTRAVENOUS | Status: DC
  Filled 2017-04-17: qty 5

## 2017-04-17 NOTE — Patient Instructions (Signed)

## 2017-04-17 NOTE — Progress Notes (Signed)
Hematology and Oncology Follow Up Visit  Marissa Cruz [id number] 18-Jun-1934 83 y.o. 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  She does look much better.  She is gained 4 pounds.  She is still having issues with loose stools.  When I listened to her abdomen today, she has hyperactive bowel sounds.  I will try some Donnatal elixir to see if this may help a little bit.  There is been no pain.  She is had no cough or shortness of breath.  She is had no rashes.  She has had no leg swelling.  It is been over 2 months since she had treatment.  Currently, her performance status is ECOG 2.   Medications:  Allergies as of 04/17/2017      Reactions   Morphine And Related Other (See Comments)   Hallucinations and combative   Codeine Nausea Only      Medication List        Accurate as of 04/17/17 12:35 PM. Always use your most recent med list.          allopurinol 100 MG tablet Commonly known as:  ZYLOPRIM TAKE 1 TABLET BY MOUTH AT BEDTIME   ALPRAZolam 0.25 MG tablet Commonly known as:  XANAX Take 1 tablet (0.25 mg total) by mouth 3 (three) times daily as needed for anxiety.   amLODipine 5 MG tablet Commonly known as:  NORVASC TAKE 1 TABLET BY MOUTH DAILY FOR HIGH BLOOD PRESSURE   aspirin 81 MG tablet Take 81 mg by mouth daily.   cetirizine 10 MG tablet Commonly known as:  ZYRTEC Take 10 mg by mouth at bedtime.   diphenoxylate-atropine 2.5-0.025 MG tablet Commonly known as:  LOMOTIL Take 1 tablet by mouth 4 (four) times daily as needed for diarrhea or loose stools.   ferrous sulfate 325 (65 FE) MG EC tablet Take 325 mg by  mouth 2 (two) times daily.   HM MULTIVITAMIN ADULT GUMMY Chew Chew 2 tablets by mouth every morning.   lidocaine-prilocaine cream Commonly known as:  EMLA APPLY EXTERNALLY TO THE AFFECTED AREA 1 TIME   lipase/protease/amylase 36000 UNITS Cpep capsule Commonly known as:  CREON Take 1 capsule (36,000 Units total) by mouth 3 (three) times daily before meals.   LORazepam 0.5 MG tablet Commonly known as:  ATIVAN Take 1 tablet (0.5 mg total) by mouth every 6 (six) hours as needed (Nausea or vomiting).   megestrol 40 MG/ML suspension Commonly known as:  MEGACE TAKE 10 ML BY MOUTH DAILY   metoprolol succinate 25 MG 24 hr tablet Commonly known as:  TOPROL-XL TAKE 1 TABLET(25 MG) BY MOUTH EVERY MORNING   mirtazapine 15 MG disintegrating tablet Commonly known as:  REMERON SOL-TAB DISSOLVE 1 TABLET UNDER THE TONGUE DAILY AT BEDTIME   nicotine 14 mg/24hr patch Commonly known as:  NICODERM CQ - dosed in mg/24 hours Place 14 mg onto the skin daily.   omeprazole 20 MG capsule Commonly known as:  PRILOSEC TAKE 1 CAPSULE BY MOUTH TWICE DAILY BEFORE A MEAL   ondansetron 8 MG disintegrating tablet Commonly known as:  ZOFRAN-ODT Take 1 tablet (8 mg total) by  mouth every 8 (eight) hours as needed for nausea or vomiting. Reported on 05/17/2015   oxandrolone 10 MG tablet Commonly known as:  OXANDRIN Take 1 tablet (10 mg total) by mouth daily with breakfast.   PARoxetine 10 MG tablet Commonly known as:  PAXIL TAKE 1 TABLET BY MOUTH DAILY FOR DEPRESSION   [name] 0.625 MG tablet Generic drug:  estrogens (conjugated) TAKE 1 TABLET BY MOUTH EVERY DAY FOR 21 DAYS. DO NOT TAKE FOR 7 DAYS   traMADol 50 MG tablet Commonly known as:  ULTRAM Take 1 tablet (50 mg total) by mouth every 6 (six) hours as needed.   traZODone 50 MG tablet Commonly known as:  DESYREL TAKE 1 TABLET BY MOUTH AT BEDTIME       Allergies:  Allergies  Allergen Reactions  . Morphine And Related Other (See  Comments)    Hallucinations and combative  . Codeine Nausea Only    Past Medical History, Surgical history, Social history, and Family History were reviewed and updated.  Review of Systems: Review of Systems  Constitutional: Negative.   HENT: Negative.   Eyes: Negative.   Respiratory: Negative.   Cardiovascular: Negative.   Gastrointestinal: Positive for diarrhea.  Genitourinary: Negative.   Musculoskeletal: Positive for myalgias.  Skin: Negative.   Neurological: Negative.   Endo/Heme/Allergies: Negative.   Psychiatric/Behavioral: Negative.      Physical Exam:  vitals were not taken for this visit.   Wt Readings from Last 3 Encounters:  04/17/17 94 lb (42.6 kg)  03/20/17 90 lb (40.8 kg)  01/30/17 95 lb (43.1 kg)   Physical Exam  Constitutional: She is oriented to person, place, and time.  HENT:  Head: Normocephalic and atraumatic.  Mouth/Throat: Oropharynx is clear and moist.  Eyes: EOM are normal. Pupils are equal, round, and reactive to light.  Neck: Normal range of motion.  Cardiovascular: Normal rate, regular rhythm and normal heart sounds.  Pulmonary/Chest: Effort normal and breath sounds normal.  Abdominal: Soft. Bowel sounds are normal.  Musculoskeletal: Normal range of motion. She exhibits no edema, tenderness or deformity.  She does have a walking boot on the right lower leg.  Lymphadenopathy:    She has no cervical adenopathy.  Neurological: She is alert and oriented to person, place, and time.  Skin: Skin is warm and dry. No rash noted. No erythema.  Psychiatric: She has a normal mood and affect. Her behavior is normal. Judgment and thought content normal.  Vitals reviewed.    Lab Results  Component Value Date   WBC 12.9 (H) 04/17/2017   HGB 10.6 (L) 01/30/2017   HCT 34.1 (L) 04/17/2017   MCV 99.1 04/17/2017   PLT 386 04/17/2017   Lab Results  Component Value Date   FERRITIN 276 (H) 01/02/2017   IRON 40 (L) 01/02/2017   TIBC 230 (L)  01/02/2017   UIBC 190 01/02/2017   IRONPCTSAT 18 (L) 01/02/2017   Lab Results  Component Value Date   RBC 3.44 (L) 04/17/2017   No results found for: KPAFRELGTCHN, LAMBDASER, KAPLAMBRATIO No results found for: IGGSERUM, IGA, IGMSERUM No results found for: Ronnald Ramp, A1GS, A2GS, [place], MSPIKE, SPEI   Chemistry      Component Value Date/Time   NA 143 04/17/2017 1200   NA 140 01/30/2017 1138   NA 136 03/07/2016 1055   K 4.1 04/17/2017 1200   K 4.2 01/30/2017 1138   K 4.1 03/07/2016 1055   CL 115 (H) 04/17/2017 1200   CL 102 01/30/2017  1138   CO2 23 04/17/2017 1200   CO2 28 01/30/2017 1138   CO2 26 03/07/2016 1055   BUN 45 (H) 04/17/2017 1200   BUN 34 (H) 01/30/2017 1138   BUN 28.7 (H) 03/07/2016 1055   CREATININE 1.90 (H) 04/17/2017 1200   CREATININE 2.7 (H) 01/30/2017 1138   CREATININE 1.3 (H) 03/07/2016 1055      Component Value Date/Time   CALCIUM 8.8 04/17/2017 1200   CALCIUM 9.3 01/30/2017 1138   CALCIUM 9.5 03/07/2016 1055   ALKPHOS 70 04/17/2017 1200   ALKPHOS 59 01/30/2017 1138   ALKPHOS 83 03/07/2016 1055   AST 19 04/17/2017 1200   AST 17 03/07/2016 1055   ALT 12 04/17/2017 1200   ALT 16 01/30/2017 1138   ALT 9 03/07/2016 1055   BILITOT 0.5 04/17/2017 1200   BILITOT 0.26 03/07/2016 1055      Impression and Plan: Marissa Cruz is a 83 year old white female. She has locally advanced, inoperable, stage IIIB squamous cell carcinoma of the right lung. Thankfully, she does have a high PD-L1 score.  I still not sure as to why she has the diarrhea.  Hopefully, the Donnatal will help.  We will do a PET scan in 3 weeks.  I think as long as he PET scan does not show any growth of her malignancy, then we can hold off on treatment.  She will get some IV fluids today.  I was not happy with her BUN and creatinine.  I am just happy that her overall functional state is a little bit better.    Volanda Napoleon, MD 2/14/201912:35 PM

## 2017-04-18 LAB — IRON AND TIBC
Iron: 87 ug/dL (ref 41–142)
Saturation Ratios: 37 % (ref 21–57)
TIBC: 235 ug/dL — ABNORMAL LOW (ref 236–444)
UIBC: 148 ug/dL

## 2017-04-18 LAB — FERRITIN: Ferritin: 145 ng/mL (ref 9–269)

## 2017-04-19 ENCOUNTER — Other Ambulatory Visit: Payer: Self-pay | Admitting: Family Medicine

## 2017-04-29 ENCOUNTER — Other Ambulatory Visit: Payer: Self-pay | Admitting: Family Medicine

## 2017-05-04 ENCOUNTER — Encounter: Payer: Self-pay | Admitting: Hematology & Oncology

## 2017-05-05 ENCOUNTER — Other Ambulatory Visit: Payer: Self-pay | Admitting: *Deleted

## 2017-05-05 ENCOUNTER — Inpatient Hospital Stay: Payer: Medicare Other | Attending: Hematology & Oncology

## 2017-05-05 ENCOUNTER — Telehealth: Payer: Self-pay | Admitting: *Deleted

## 2017-05-05 DIAGNOSIS — A0472 Enterocolitis due to Clostridium difficile, not specified as recurrent: Secondary | ICD-10-CM | POA: Insufficient documentation

## 2017-05-05 DIAGNOSIS — R0989 Other specified symptoms and signs involving the circulatory and respiratory systems: Secondary | ICD-10-CM | POA: Diagnosis not present

## 2017-05-05 DIAGNOSIS — C3431 Malignant neoplasm of lower lobe, right bronchus or lung: Secondary | ICD-10-CM | POA: Diagnosis not present

## 2017-05-05 DIAGNOSIS — R634 Abnormal weight loss: Secondary | ICD-10-CM | POA: Insufficient documentation

## 2017-05-05 LAB — C DIFFICILE QUICK SCREEN W PCR REFLEX
C Diff antigen: POSITIVE — AB
C Diff toxin: NEGATIVE

## 2017-05-05 LAB — CLOSTRIDIUM DIFFICILE BY PCR, REFLEXED: CDIFFPCR: POSITIVE — AB

## 2017-05-05 NOTE — Telephone Encounter (Addendum)
Hi this is Kennedy Bucker, Beecher Mcardle Smiths daughter. Mom is still have troubles with diarrhea. She was taking combination of Lomotil and Immodium. Last time we were in, you switched her to [facility], which made her worse. We stopped the Belladonna and she has been back on the immodium/lomotil combo for over a week and is still frequent pure liquid. I have made and appointment with her GP, but Dr. Etter Sjogren cannot get her in until Friday 3/8, the same day she has a PET scan. Do you want to see her sooner or should I wait until Friday with Dr. Etter Sjogren?  Please call me and let me know  [phone]  Thank you  Kennedy Bucker    Reviewed above message with Dr Marin Olp. He would like patient to bring in stool specimen for testing. Patient's daughter given collection kit. Patient will bring back specimen.

## 2017-05-06 ENCOUNTER — Other Ambulatory Visit: Payer: Self-pay | Admitting: Hematology & Oncology

## 2017-05-06 MED ORDER — METRONIDAZOLE 500 MG PO TABS: 500.0000 mg | ORAL_TABLET | Freq: Three times a day (TID) | ORAL | 0 refills | Status: DC

## 2017-05-06 NOTE — Progress Notes (Signed)
Marissa Cruz stool specimen came back positive for C. difficile.  I called her daughter.  We will start having her take Flagyl 500 mg p.o. 3 times daily for 10 days.  I told her daughter to make sure that Marissa Cruz takes a lot of liquids.  She cannot eat a lot of solid food right now.  But if we can get a lot of liquids in her, then hopefully, she will not get dehydrated and ended up in the hospital.   I sent the prescription into her pharmacy.  She will start this this morning.   Her daughter understands this.    Marissa Haw, MD

## 2017-05-09 ENCOUNTER — Encounter: Payer: Self-pay | Admitting: Family Medicine

## 2017-05-09 ENCOUNTER — Telehealth: Payer: Self-pay | Admitting: *Deleted

## 2017-05-09 ENCOUNTER — Ambulatory Visit (INDEPENDENT_AMBULATORY_CARE_PROVIDER_SITE_OTHER): Payer: Medicare Other | Admitting: Family Medicine

## 2017-05-09 ENCOUNTER — Encounter (HOSPITAL_COMMUNITY)
Admission: RE | Admit: 2017-05-09 | Discharge: 2017-05-09 | Disposition: A | Discharge status: Home / Self Care | Payer: Medicare Other | Source: Ambulatory Visit | Attending: Hematology & Oncology | Admitting: Hematology & Oncology

## 2017-05-09 DIAGNOSIS — J329 Chronic sinusitis, unspecified: Secondary | ICD-10-CM

## 2017-05-09 DIAGNOSIS — B9789 Other viral agents as the cause of diseases classified elsewhere: Secondary | ICD-10-CM | POA: Insufficient documentation

## 2017-05-09 DIAGNOSIS — C3490 Malignant neoplasm of unspecified part of unspecified bronchus or lung: Secondary | ICD-10-CM | POA: Diagnosis not present

## 2017-05-09 DIAGNOSIS — A0472 Enterocolitis due to Clostridium difficile, not specified as recurrent: Secondary | ICD-10-CM | POA: Diagnosis not present

## 2017-05-09 DIAGNOSIS — C3431 Malignant neoplasm of lower lobe, right bronchus or lung: Secondary | ICD-10-CM | POA: Diagnosis not present

## 2017-05-09 LAB — GLUCOSE, CAPILLARY: Glucose-Capillary: 71 mg/dL (ref 65–99)

## 2017-05-09 MED ORDER — FLUDEOXYGLUCOSE F - 18 (FDG) INJECTION
4.9900 | Freq: Once | INTRAVENOUS | Status: AC | PRN
  Administered 2017-05-09: 4.99 via INTRAVENOUS

## 2017-05-09 NOTE — Assessment & Plan Note (Signed)
flonase and otc antihistamine Inc po fluids

## 2017-05-09 NOTE — Assessment & Plan Note (Signed)
Finish flagyl Symptoms improving -- pt does not like gatorade--- will try pedialyte ice pops x 1-2 days if needed  Go to ER ifsymptoms worsen

## 2017-05-09 NOTE — Patient Instructions (Signed)
Clostridium Difficile Infection   Clostridium difficile (C. difficile or C. diff) infection causes inflammation of the large intestine (colon). This condition can result in damage to the lining of your colon and may lead to another condition called colitis. This infection can be passed from person to person (is contagious).  Follow these instructions at home:  Eating and drinking   · Drink enough fluid to keep your pee (urine) clear or pale yellow.  · Avoid drinking:  ? Milk.  ? Caffeine.  ? Alcohol.  · Follow exact instructions from your doctor about how to get enough fluid in your body (rehydrate).  · Eat small meals often instead of large meals.  Medicines   · Take your antibiotic medicine as told by your doctor. Do not stop taking the antibiotic even if you start to feel better unless your doctor told you to do that.  · Take over-the-counter and prescription medicines only as told by your doctor.  · Do not use medicines to help with watery poop (diarrhea).  General instructions   · Wash your hands fully before you prepare food and after you use the bathroom. Make sure people who live with you also wash their  · hands often.  · Clean the surfaces that you touch. Use a product that contains chlorine bleach.  · Keep all follow-up visits as told by your doctor. This is important.  Contact a doctor if:  · Your symptoms do not get better with treatment.  · Your symptoms get worse with treatment.  · Your symptoms go away and then come back.  · You have a fever.  · You have new symptoms.  Get help right away if:  · You have more pain or tenderness in your belly (abdomen).  · Your poop (stool) is mostly bloody.  · Your poop looks dark black and tarry.  · You cannot eat or drink without throwing up (vomiting).  · You have signs of dehydration, such as:  ? Dark pee, very little pee, or no pee.  ? Cracked lips.  ? Not making tears when you cry.  ? Dry mouth.  ? Sunken eyes.  ? Feeling sleepy.  ? Feeling weak.  ? Feeling  dizzy.  This information is not intended to replace advice given to you by your health care provider. Make sure you discuss any questions you have with your health care provider.  Document Released: 12/16/2008 Document Revised: 07/27/2015 Document Reviewed: 08/22/2014  Elsevier Interactive Patient Education © 2017 [employer] Inc.

## 2017-05-09 NOTE — Telephone Encounter (Addendum)
Patient's daughter is aware of results  ----- Message from Volanda Napoleon, MD sent at 05/09/2017  1:20 PM EST ----- Call her dgtr - NO change in the cancer!! It is not growing!!!  Marissa Cruz

## 2017-05-09 NOTE — Progress Notes (Signed)
Subjective:  I acted as a Education administrator for [employer]. Yancey Flemings, [place]   Patient ID: Marissa Cruz, female    DOB: 05-01-1934, 83 y.o.   MRN: [id number]  Chief Complaint  Patient presents with  . Diarrhea    severily for two weeks, but on and off for two months    HPI  Patient is in today for concerns about diarrhea.    She was dx with c diff and tx with flagyl.  The diarrhea is improving.  She is now c/o sinus congestion --- stuffy nose and sinus pressure.  No fever-- x few days  Patient Care Team: Carollee Herter, Alferd Apa, DO as PCP - General (Family Medicine) Ladene Artist, MD as Consulting Physician (Gastroenterology) Michaela Corner, DDS as Consulting Physician (Oral Surgery) Renda Rolls Jennefer Bravo, MD as Referring Physician (Dermatology) Milus Height, MD as Referring Physician (Ophthalmology)   Past Medical History:  Diagnosis Date  . Age-related macular degeneration, dry, right eye   . Age-related macular degeneration, wet, left eye ([place])   . Anemia   . Anxiety   . Arthritis    "thumbs, hands" (08/18/2014)  . CAP (community acquired pneumonia) 08/19/2014  . Carotid artery disease ([place])   . Chicken pox   . Chronic lower back pain   . Colon polyp   . Depression   . Diverticulitis   . GERD (gastroesophageal reflux disease)   . Goals of care, counseling/discussion 03/07/2016  . History of blood transfusion 1976; 2013   "w/hysterectomy; hip OR"  . History of gout   . Hypertension   . Iron deficiency anemia due to chronic blood loss 06/06/2016  . 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 Kidney disease   . 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 TIA (transient ischemic attack) ~ 2009 X 2  . Vocal cord cancer ([place]) ~ 2009   S/P radiation    Past Surgical History:  Procedure Laterality Date  . ABDOMINAL HYSTERECTOMY  1976  . APPENDECTOMY  1954  . CAROTID ENDARTERECTOMY Left ~ 2014  . CATARACT EXTRACTION W/ INTRAOCULAR LENS  IMPLANT, BILATERAL  Bilateral ~ 2009  . COLONOSCOPY N/A 01/31/2014   Procedure: COLONOSCOPY;  Surgeon: Ladene Artist, MD;  Location: [facility] ENDOSCOPY;  Service: Endoscopy;  Laterality: N/A;  . COLONOSCOPY N/A 07/15/2016   Procedure: COLONOSCOPY;  Surgeon: Milus Banister, MD;  Location: [facility] ENDOSCOPY;  Service: Endoscopy;  Laterality: N/A;  . DILATION AND CURETTAGE OF UTERUS    . IR GENERIC HISTORICAL  03/12/2016   IR US GUIDE VASC ACCESS RIGHT 03/12/2016 Jacqulynn Cadet, MD [facility]-INTER[facility]  . IR GENERIC HISTORICAL  03/12/2016   IR FLUORO GUIDE PORT INSERTION RIGHT 03/12/2016 Jacqulynn Cadet, MD [facility]  . JOINT REPLACEMENT    . MOUTH FLOOR MASS EXCISION  03/2014   "removed cancer underneath my tongue"  . TONSILLECTOMY  1941  . TOTAL HIP ARTHROPLASTY Left 2013    Family History  Problem Relation Age of Onset  . Hypertension Father   . Skin cancer Father   . Emphysema Paternal Grandfather   . Liver cancer Mother   . Stomach cancer Maternal Grandfather     Social History   Socioeconomic History  . Marital status: Widowed    Spouse name: Not on file  . Number of children: 1  . Years of education: Not on file  . Highest education level: Not on file  Social Needs  . Financial resource strain: Not on file  .  Food insecurity - worry: Not on file  . Food insecurity - inability: Not on file  . Transportation needs - medical: Not on file  . Transportation needs - non-medical: Not on file  Occupational History  . Occupation: Retired  Tobacco Use  . Smoking status: Light Tobacco Smoker    Packs/day: 1.00    Years: 60.00    Pack years: 60.00    Types: Cigarettes    Last attempt to quit: 05/15/2016    Years since quitting: 0.9  . Smokeless tobacco: Never Used  . Tobacco comment: states "one cigarette a week"  Substance and Sexual Activity  . Alcohol use: Yes    Alcohol/week: 4.2 oz    Types: 7 Shots of liquor per week    Comment:  " shots brandy q night"  . Drug use: No  . Sexual activity: Not  Currently  Other Topics Concern  . Not on file  Social History Narrative  . Not on file    Outpatient Medications Prior to Visit  Medication Sig Dispense Refill  . allopurinol (ZYLOPRIM) 100 MG tablet TAKE 1 TABLET BY MOUTH AT BEDTIME 90 tablet 0  . ALPRAZolam (XANAX) 0.25 MG tablet Take 1 tablet (0.25 mg total) by mouth 3 (three) times daily as needed for anxiety. 30 tablet 0  . amLODipine (NORVASC) 5 MG tablet TAKE 1 TABLET BY MOUTH DAILY FOR HIGH BLOOD PRESSURE 90 tablet 0  . aspirin 81 MG tablet Take 81 mg by mouth daily.    . belladonna-PHENObarbital (DONNATAL) 16.2 MG/5ML ELIX Take 5 mLs (16.2 mg total) by mouth 3 (three) times daily. 600 mL 1  . cetirizine (ZYRTEC) 10 MG tablet Take 10 mg by mouth at bedtime.    . diphenoxylate-atropine (LOMOTIL) 2.5-0.025 MG tablet Take 1 tablet by mouth 4 (four) times daily as needed for diarrhea or loose stools. 100 tablet 0  . ferrous sulfate 325 (65 FE) MG EC tablet Take 325 mg by mouth 2 (two) times daily.    Marland Kitchen lidocaine-prilocaine (EMLA) cream APPLY EXTERNALLY TO THE AFFECTED AREA 1 TIME 30 g 0  . lipase/protease/amylase (CREON) 36000 UNITS CPEP capsule Take 1 capsule (36,000 Units total) by mouth 3 (three) times daily before meals. 180 capsule 4  . LORazepam (ATIVAN) 0.5 MG tablet Take 1 tablet (0.5 mg total) by mouth every 6 (six) hours as needed (Nausea or vomiting). 30 tablet 0  . megestrol (MEGACE) 40 MG/ML suspension TAKE 10 ML BY MOUTH DAILY 480 mL 0  . metoprolol succinate (TOPROL-XL) 25 MG 24 hr tablet TAKE 1 TABLET(25 MG) BY MOUTH EVERY MORNING 90 tablet 0  . metroNIDAZOLE (FLAGYL) 500 MG tablet Take 1 tablet (500 mg total) by mouth 3 (three) times daily. 30 tablet 0  . mirtazapine (REMERON SOL-TAB) 15 MG disintegrating tablet DISSOLVE 1 TABLET UNDER THE TONGUE DAILY AT BEDTIME 30 tablet 0  . Multiple Vitamins-Minerals (HM MULTIVITAMIN ADULT GUMMY) CHEW Chew 2 tablets by mouth every morning.    . nicotine (NICODERM CQ - DOSED IN MG/24  HOURS) 14 mg/24hr patch Place 14 mg onto the skin daily.    Marland Kitchen omeprazole (PRILOSEC) 20 MG capsule TAKE 1 CAPSULE BY MOUTH TWICE DAILY BEFORE A MEAL 60 capsule 0  . ondansetron (ZOFRAN-ODT) 8 MG disintegrating tablet Take 1 tablet (8 mg total) by mouth every 8 (eight) hours as needed for nausea or vomiting. Reported on 05/17/2015 90 tablet 5  . oxandrolone (OXANDRIN) 10 MG tablet Take 1 tablet (10 mg total) by mouth daily  with breakfast. 30 tablet 2  . PARoxetine (PAXIL) 10 MG tablet TAKE 1 TABLET BY MOUTH DAILY FOR DEPRESSION 90 tablet 0  . [name] 0.625 MG tablet TAKE 1 TABLET BY MOUTH EVERY DAY FOR 21 DAYS. DO NOT TAKE FOR 7 DAYS 90 tablet 0  . traMADol (ULTRAM) 50 MG tablet Take 1 tablet (50 mg total) by mouth every 6 (six) hours as needed. 60 tablet 0  . traZODone (DESYREL) 50 MG tablet TAKE 1 TABLET BY MOUTH AT BEDTIME 90 tablet 0   Facility-Administered Medications Prior to Visit  Medication Dose Route Frequency Provider Last Rate Last Dose  . sodium chloride flush (NS) 0.9 % injection 10 mL  10 mL Intracatheter PRN Volanda Napoleon, MD   10 mL at 04/25/16 1355  . sodium chloride flush (NS) 0.9 % injection 10 mL  10 mL Intravenous PRN Volanda Napoleon, MD   10 mL at 03/20/17 1151    Allergies  Allergen Reactions  . Morphine And Related Other (See Comments)    Hallucinations and combative  . Codeine Nausea Only    Review of Systems  Constitutional: Negative for chills, fever and malaise/fatigue.  HENT: Negative for congestion and hearing loss.   Eyes: Negative for discharge.  Respiratory: Negative for cough, sputum production and shortness of breath.   Cardiovascular: Negative for chest pain, palpitations and leg swelling.  Gastrointestinal: Negative for abdominal pain, blood in stool, constipation, diarrhea, heartburn, nausea and vomiting.  Genitourinary: Negative for dysuria, frequency, hematuria and urgency.  Musculoskeletal: Negative for back pain, falls and myalgias.  Skin:  Negative for rash.  Neurological: Negative for dizziness, sensory change, loss of consciousness, weakness and headaches.  Endo/Heme/Allergies: Negative for environmental allergies. Does not bruise/bleed easily.  Psychiatric/Behavioral: Negative for depression and suicidal ideas. The patient is not nervous/anxious and does not have insomnia.        Objective:    Physical Exam  Constitutional: She is oriented to person, place, and time. She appears well-developed and well-nourished.  HENT:  Head: Normocephalic and atraumatic.  Right Ear: Tympanic membrane and ear canal normal.  Left Ear: Tympanic membrane and ear canal normal.  Nose: Rhinorrhea present. Right sinus exhibits frontal sinus tenderness. Right sinus exhibits no maxillary sinus tenderness. Left sinus exhibits frontal sinus tenderness. Left sinus exhibits no maxillary sinus tenderness.  Eyes: Conjunctivae and EOM are normal.  Neck: Normal range of motion. Neck supple. No JVD present. Carotid bruit is not present. No thyromegaly present.  Cardiovascular: Normal rate, regular rhythm and normal heart sounds.  No murmur heard. Pulmonary/Chest: Effort normal and breath sounds normal. No respiratory distress. She has no wheezes. She has no rales. She exhibits no tenderness.  Musculoskeletal: She exhibits no edema.  Lymphadenopathy:       Right cervical: No superficial cervical, no deep cervical and no posterior cervical adenopathy present.      Left cervical: No superficial cervical, no deep cervical and no posterior cervical adenopathy present.  Neurological: She is alert and oriented to person, place, and time.  Psychiatric: She has a normal mood and affect.  Nursing note and vitals reviewed.   BP 118/64 (BP Location: Left Arm, Patient Position: Sitting, Cuff Size: Normal)   Pulse (!) 115   Temp 98.1 F (36.7 C) (Oral)   Resp 16   Ht 5' 6.93" (1.7 m)   Wt 91 lb 12.8 oz (41.6 kg)   SpO2 96%   BMI 14.41 kg/m  Wt Readings  from Last 3 Encounters:  05/09/17 91 lb 12.8 oz (41.6 kg)  04/17/17 94 lb (42.6 kg)  03/20/17 90 lb (40.8 kg)   BP Readings from Last 3 Encounters:  05/09/17 118/64  04/17/17 (!) 157/60  03/20/17 (!) 152/70     Immunization History  Administered Date(s) Administered  . Influenza, High Dose Seasonal PF 11/17/2014, 11/21/2015, 01/02/2017  . Influenza-Unspecified 12/02/2013  . Pneumococcal Conjugate-13 10/21/2013, 11/17/2014  . Pneumococcal Polysaccharide-23 10/21/2013  . Tdap 05/02/2013  . Zoster 11/02/2013    Health Maintenance  Topic Date Due  . TETANUS/TDAP  05/03/2023  . INFLUENZA VACCINE  Completed  . DEXA SCAN  Completed  . PNA vac Low Risk Adult  Completed    Lab Results  Component Value Date   WBC 12.9 (H) 04/17/2017   HGB 10.6 (L) 01/30/2017   HCT 34.1 (L) 04/17/2017   PLT 386 04/17/2017   GLUCOSE 116 04/17/2017   CHOL 183 11/21/2015   TRIG 124.0 11/21/2015   HDL 94.30 11/21/2015   LDLDIRECT 73.2 10/21/2013   LDLCALC 64 11/21/2015   ALT 12 04/17/2017   AST 19 04/17/2017   NA 143 04/17/2017   K 4.1 04/17/2017   CL 115 (H) 04/17/2017   CREATININE 1.90 (H) 04/17/2017   BUN 45 (H) 04/17/2017   CO2 23 04/17/2017   TSH 3.535 09/06/2016   INR 1.00 07/13/2016    Lab Results  Component Value Date   TSH 3.535 09/06/2016   Lab Results  Component Value Date   WBC 12.9 (H) 04/17/2017   HGB 10.6 (L) 01/30/2017   HCT 34.1 (L) 04/17/2017   MCV 99.1 04/17/2017   PLT 386 04/17/2017   Lab Results  Component Value Date   NA 143 04/17/2017   K 4.1 04/17/2017   CHLORIDE 101 03/07/2016   CO2 23 04/17/2017   GLUCOSE 116 04/17/2017   BUN 45 (H) 04/17/2017   CREATININE 1.90 (H) 04/17/2017   BILITOT 0.5 04/17/2017   ALKPHOS 70 04/17/2017   AST 19 04/17/2017   ALT 12 04/17/2017   PROT 5.5 (L) 04/17/2017   ALBUMIN 2.6 (L) 04/17/2017   CALCIUM 8.8 04/17/2017   ANIONGAP 5 04/17/2017   EGFR 38 (L) 03/07/2016   GFR 41.62 (L) 12/21/2015   Lab Results    Component Value Date   CHOL 183 11/21/2015   Lab Results  Component Value Date   HDL 94.30 11/21/2015   Lab Results  Component Value Date   LDLCALC 64 11/21/2015   Lab Results  Component Value Date   TRIG 124.0 11/21/2015   Lab Results  Component Value Date   CHOLHDL 2 11/21/2015   No results found for: HGBA1C       Assessment & Plan:   Problem List Items Addressed This Visit      Unprioritized   C. difficile diarrhea    Finish flagyl Symptoms improving -- pt does not like gatorade--- will try pedialyte ice pops x 1-2 days if needed  Go to ER ifsymptoms worsen      Viral sinusitis    flonase and otc antihistamine Inc po fluids           I am having Marissa Cruz maintain her aspirin, HM MULTIVITAMIN ADULT GUMMY, ondansetron, LORazepam, cetirizine, nicotine, ferrous sulfate, ALPRAZolam, traMADol, amLODipine, oxandrolone, omeprazole, traZODone, [name], allopurinol, lipase/protease/amylase, diphenoxylate-atropine, lidocaine-prilocaine, megestrol, belladonna-PHENObarbital, mirtazapine, metoprolol succinate, PARoxetine, and metroNIDAZOLE.  No orders of the defined types were placed in this encounter.   CMA served as Education administrator during this visit. History, Physical and Plan  performed by medical provider. Documentation and orders reviewed and attested to.  Ann Held, DO

## 2017-05-12 LAB — GLUCOSE, CAPILLARY: Glucose-Capillary: <10 mg/dL — CL (ref 65–99)

## 2017-05-14 ENCOUNTER — Encounter: Payer: Self-pay | Admitting: Family Medicine

## 2017-05-15 ENCOUNTER — Inpatient Hospital Stay: Payer: Medicare Other

## 2017-05-15 ENCOUNTER — Inpatient Hospital Stay (HOSPITAL_BASED_OUTPATIENT_CLINIC_OR_DEPARTMENT_OTHER): Payer: Medicare Other | Admitting: Hematology & Oncology

## 2017-05-15 VITALS — BP 109/61 | HR 120 | Temp 97.1°F | Resp 20 | Wt 88.8 lb

## 2017-05-15 DIAGNOSIS — R634 Abnormal weight loss: Secondary | ICD-10-CM | POA: Diagnosis not present

## 2017-05-15 DIAGNOSIS — R0989 Other specified symptoms and signs involving the circulatory and respiratory systems: Secondary | ICD-10-CM

## 2017-05-15 DIAGNOSIS — A0472 Enterocolitis due to Clostridium difficile, not specified as recurrent: Secondary | ICD-10-CM | POA: Diagnosis not present

## 2017-05-15 DIAGNOSIS — C3431 Malignant neoplasm of lower lobe, right bronchus or lung: Secondary | ICD-10-CM

## 2017-05-15 DIAGNOSIS — Z95828 Presence of other vascular implants and grafts: Secondary | ICD-10-CM

## 2017-05-15 LAB — CBC WITH DIFFERENTIAL (CANCER CENTER ONLY)
Basophils Absolute: 0.3 10*3/uL — ABNORMAL HIGH (ref 0.0–0.1)
Basophils Relative: 2 %
EOS ABS: 0.2 10*3/uL (ref 0.0–0.5)
Eosinophils Relative: 1 %
HCT: 35.2 % (ref 34.8–46.6)
HEMOGLOBIN: 11.7 g/dL (ref 11.6–15.9)
LYMPHS ABS: 1.8 10*3/uL (ref 0.9–3.3)
LYMPHS PCT: 12 %
MCH: 32.1 pg (ref 26.0–34.0)
MCHC: 33.2 g/dL (ref 32.0–36.0)
MCV: 96.7 fL (ref 81.0–101.0)
MONOS PCT: 11 %
Monocytes Absolute: 1.6 10*3/uL — ABNORMAL HIGH (ref 0.1–0.9)
NEUTROS PCT: 74 %
Neutro Abs: 10.7 10*3/uL — ABNORMAL HIGH (ref 1.5–6.5)
Platelet Count: 677 10*3/uL — ABNORMAL HIGH (ref 145–400)
RBC: 3.64 MIL/uL — AB (ref 3.70–5.32)
RDW: 16.8 % — ABNORMAL HIGH (ref 11.1–15.7)
WBC: 14.5 10*3/uL — AB (ref 3.9–10.0)

## 2017-05-15 LAB — CMP (CANCER CENTER ONLY)
ALBUMIN: 3 g/dL — AB (ref 3.5–5.0)
ALK PHOS: 69 U/L (ref 26–84)
ALT: 18 U/L (ref 10–47)
AST: 25 U/L (ref 11–38)
Anion gap: 5 (ref 5–15)
BILIRUBIN TOTAL: 0.4 mg/dL (ref 0.2–1.6)
BUN: 26 mg/dL — ABNORMAL HIGH (ref 7–22)
CHLORIDE: 114 mmol/L — AB (ref 98–108)
CO2: 23 mmol/L (ref 18–33)
CREATININE: 2.3 mg/dL — AB (ref 0.60–1.20)
Calcium: 9 mg/dL (ref 8.0–10.3)
Glucose, Bld: 120 mg/dL — ABNORMAL HIGH (ref 73–118)
Potassium: 3.9 mmol/L (ref 3.3–4.7)
Sodium: 142 mmol/L (ref 128–145)
Total Protein: 5.8 g/dL — ABNORMAL LOW (ref 6.4–8.1)

## 2017-05-15 LAB — LACTATE DEHYDROGENASE: LDH: 156 U/L (ref 125–245)

## 2017-05-15 MED ORDER — SODIUM CHLORIDE 0.9 % IV SOLN
INTRAVENOUS | Status: DC
  Administered 2017-05-15: 13:00:00 via INTRAVENOUS

## 2017-05-15 MED ORDER — IPRATROPIUM-ALBUTEROL 0.5-2.5 (3) MG/3ML IN SOLN
3.0000 mL | Freq: Once | RESPIRATORY_TRACT | Status: AC
  Administered 2017-05-15: 3 mL via RESPIRATORY_TRACT

## 2017-05-15 MED ORDER — SODIUM CHLORIDE 0.9% FLUSH
10.0000 mL | INTRAVENOUS | Status: DC | PRN
  Administered 2017-05-15: 10 mL via INTRAVENOUS
  Filled 2017-05-15: qty 10

## 2017-05-15 MED ORDER — HEPARIN SOD (PORK) LOCK FLUSH 100 UNIT/ML IV SOLN
500.0000 [IU] | Freq: Once | INTRAVENOUS | Status: AC
  Administered 2017-05-15: 500 [IU] via INTRAVENOUS
  Filled 2017-05-15: qty 5

## 2017-05-15 MED ORDER — IPRATROPIUM-ALBUTEROL 0.5-2.5 (3) MG/3ML IN SOLN
RESPIRATORY_TRACT | Status: AC
  Filled 2017-05-15: qty 3

## 2017-05-15 NOTE — Progress Notes (Signed)
Hematology and Oncology Follow Up Visit  Oluwakemi Salsberry [id number] 10-05-1934 83 y.o. 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 Holstrom is back for follow-up.  Unfortunately, she has a problem with C. difficile.  She was having some diarrhea.  The diarrhea did not get better.  We had her come in to get checked for C. difficile.  It is positive.  She is on Flagyl.  She will finish up today.  The diarrhea is better.  She only goes once a day.  Unfortunately, she lost quite a bit of weight.  She now is down to 88 pounds.  I told her that she will not die from her cancer.  I told her that if she does not gain weight, she will died of stress that is being placed on her heart by the weight loss.  Thankfully, she did have a PET scan done that showed that her cancer was stable and not growing.  It is been 4 months since she had any therapy.  Hopefully, with the immunotherapy that she took, she will stay in remission for a while.  She has congestion.  I am going to give her some nebulizer today.  She just has a lot of other health problems right now.  Overall, her performance status is ECOG 3.  See hopefully, she will start to gain weight.  We will M   Medications:  Allergies as of 05/15/2017      Reactions   Morphine And Related Other (See Comments)   Hallucinations and combative   Codeine Nausea Only      Medication List        Accurate as of 05/15/17 12:00 PM. Always use your most recent med list.          allopurinol 100 MG tablet Commonly known as:  ZYLOPRIM TAKE 1 TABLET BY MOUTH AT BEDTIME   ALPRAZolam 0.25 MG tablet Commonly known as:  XANAX Take 1 tablet  (0.25 mg total) by mouth 3 (three) times daily as needed for anxiety.   amLODipine 5 MG tablet Commonly known as:  NORVASC TAKE 1 TABLET BY MOUTH DAILY FOR HIGH BLOOD PRESSURE   aspirin 81 MG tablet Take 81 mg by mouth daily.   belladonna-PHENObarbital 16.2 MG/5ML Elix Commonly known as:  DONNATAL Take 5 mLs (16.2 mg total) by mouth 3 (three) times daily.   cetirizine 10 MG tablet Commonly known as:  ZYRTEC Take 10 mg by mouth at bedtime.   diphenoxylate-atropine 2.5-0.025 MG tablet Commonly known as:  LOMOTIL Take 1 tablet by mouth 4 (four) times daily as needed for diarrhea or loose stools.   ferrous sulfate 325 (65 FE) MG EC tablet Take 325 mg by mouth 2 (two) times daily.   HM MULTIVITAMIN ADULT GUMMY Chew Chew 2 tablets by mouth every morning.   lidocaine-prilocaine cream Commonly known as:  EMLA APPLY EXTERNALLY TO THE AFFECTED AREA 1 TIME   lipase/protease/amylase 36000 UNITS Cpep capsule Commonly known as:  CREON Take 1 capsule (36,000 Units total) by mouth 3 (three) times daily before meals.   LORazepam 0.5 MG tablet Commonly known as:  ATIVAN Take 1 tablet (0.5 mg total) by  mouth every 6 (six) hours as needed (Nausea or vomiting).   megestrol 40 MG/ML suspension Commonly known as:  MEGACE TAKE 10 ML BY MOUTH DAILY   metoprolol succinate 25 MG 24 hr tablet Commonly known as:  TOPROL-XL TAKE 1 TABLET(25 MG) BY MOUTH EVERY MORNING   metroNIDAZOLE 500 MG tablet Commonly known as:  FLAGYL Take 1 tablet (500 mg total) by mouth 3 (three) times daily.   mirtazapine 15 MG disintegrating tablet Commonly known as:  REMERON SOL-TAB DISSOLVE 1 TABLET UNDER THE TONGUE DAILY AT BEDTIME   nicotine 14 mg/24hr patch Commonly known as:  NICODERM CQ - dosed in mg/24 hours Place 14 mg onto the skin daily.   omeprazole 20 MG capsule Commonly known as:  PRILOSEC TAKE 1 CAPSULE BY MOUTH TWICE DAILY BEFORE A MEAL   ondansetron 8 MG disintegrating tablet Commonly known  as:  ZOFRAN-ODT Take 1 tablet (8 mg total) by mouth every 8 (eight) hours as needed for nausea or vomiting. Reported on 05/17/2015   oxandrolone 10 MG tablet Commonly known as:  OXANDRIN Take 1 tablet (10 mg total) by mouth daily with breakfast.   PARoxetine 10 MG tablet Commonly known as:  PAXIL TAKE 1 TABLET BY MOUTH DAILY FOR DEPRESSION   [name] 0.625 MG tablet Generic drug:  estrogens (conjugated) TAKE 1 TABLET BY MOUTH EVERY DAY FOR 21 DAYS. DO NOT TAKE FOR 7 DAYS   traMADol 50 MG tablet Commonly known as:  ULTRAM Take 1 tablet (50 mg total) by mouth every 6 (six) hours as needed.   traZODone 50 MG tablet Commonly known as:  DESYREL TAKE 1 TABLET BY MOUTH AT BEDTIME       Allergies:  Allergies  Allergen Reactions  . Morphine And Related Other (See Comments)    Hallucinations and combative  . Codeine Nausea Only    Past Medical History, Surgical history, Social history, and Family History were reviewed and updated.  Review of Systems: Review of Systems  Constitutional: Negative.   HENT: Negative.   Eyes: Negative.   Respiratory: Negative.   Cardiovascular: Negative.   Gastrointestinal: Positive for diarrhea.  Genitourinary: Negative.   Musculoskeletal: Positive for myalgias.  Skin: Negative.   Neurological: Negative.   Endo/Heme/Allergies: Negative.   Psychiatric/Behavioral: Negative.      Physical Exam:  weight is 88 lb 12 oz (40.3 kg). Her oral temperature is 97.1 F (36.2 C) (abnormal). Her blood pressure is 109/61 and her pulse is 120 (abnormal). Her respiration is 20 and oxygen saturation is 97%.   Wt Readings from Last 3 Encounters:  05/15/17 88 lb 12 oz (40.3 kg)  05/09/17 91 lb 12.8 oz (41.6 kg)  04/17/17 94 lb (42.6 kg)   Physical Exam  Constitutional: She is oriented to person, place, and time.  HENT:  Head: Normocephalic and atraumatic.  Mouth/Throat: Oropharynx is clear and moist.  Eyes: EOM are normal. Pupils are equal, round, and  reactive to light.  Neck: Normal range of motion.  Cardiovascular: Normal rate, regular rhythm and normal heart sounds.  Pulmonary/Chest: Effort normal and breath sounds normal.  Abdominal: Soft. Bowel sounds are normal.  Musculoskeletal: Normal range of motion. She exhibits no edema, tenderness or deformity.  She does have a walking boot on the right lower leg.  Lymphadenopathy:    She has no cervical adenopathy.  Neurological: She is alert and oriented to person, place, and time.  Skin: Skin is warm and dry. No rash noted. No erythema.  Psychiatric: She has a  normal mood and affect. Her behavior is normal. Judgment and thought content normal.  Vitals reviewed.    Lab Results  Component Value Date   WBC 14.5 (H) 05/15/2017   HGB 10.6 (L) 01/30/2017   HCT 35.2 05/15/2017   MCV 96.7 05/15/2017   PLT 677 (H) 05/15/2017   Lab Results  Component Value Date   FERRITIN 145 04/17/2017   IRON 87 04/17/2017   TIBC 235 (L) 04/17/2017   UIBC 148 04/17/2017   IRONPCTSAT 37 04/17/2017   Lab Results  Component Value Date   RBC 3.64 (L) 05/15/2017   No results found for: KPAFRELGTCHN, LAMBDASER, KAPLAMBRATIO No results found for: Kandis Cocking, IGMSERUM No results found for: Odetta Pink, SPEI   Chemistry      Component Value Date/Time   NA 142 05/15/2017 1100   NA 140 01/30/2017 1138   NA 136 03/07/2016 1055   K 3.9 05/15/2017 1100   K 4.2 01/30/2017 1138   K 4.1 03/07/2016 1055   CL 114 (H) 05/15/2017 1100   CL 102 01/30/2017 1138   CO2 23 05/15/2017 1100   CO2 28 01/30/2017 1138   CO2 26 03/07/2016 1055   BUN 26 (H) 05/15/2017 1100   BUN 34 (H) 01/30/2017 1138   BUN 28.7 (H) 03/07/2016 1055   CREATININE 2.30 (H) 05/15/2017 1100   CREATININE 2.7 (H) 01/30/2017 1138   CREATININE 1.3 (H) 03/07/2016 1055      Component Value Date/Time   CALCIUM 9.0 05/15/2017 1100   CALCIUM 9.3 01/30/2017 1138   CALCIUM 9.5  03/07/2016 1055   ALKPHOS 69 05/15/2017 1100   ALKPHOS 59 01/30/2017 1138   ALKPHOS 83 03/07/2016 1055   AST 25 05/15/2017 1100   AST 17 03/07/2016 1055   ALT 18 05/15/2017 1100   ALT 16 01/30/2017 1138   ALT 9 03/07/2016 1055   BILITOT 0.4 05/15/2017 1100   BILITOT 0.26 03/07/2016 1055      Impression and Plan: Ms. Nussbaumer is a 83 year old white female. She has locally advanced, inoperable, stage IIIB squamous cell carcinoma of the right lung. Thankfully, she does have a high PD-L1 score.  I am little bit worried right now about her kidney function.  I think that this might be a reflection of the weight loss.  I do not think this is from the antibiotic that she is taking for her diarrhea.  I am going to give her some IV fluids today.  With Ms. Marshman, this is all pretty complicated.  We spent about 35 minutes with her today.  Over 50% of the time was spent face-to-face with she and her daughter.  I would like to see her back in about 3-4 weeks.  For some reason, it would not surprise me if she ended up in the hospital.  Volanda Napoleon, MD 3/14/201912:00 PM

## 2017-05-15 NOTE — Telephone Encounter (Signed)
Glad she is feeling a little better We can refer to GI if symptoms do not sig improve

## 2017-05-23 ENCOUNTER — Other Ambulatory Visit: Payer: Self-pay | Admitting: Hematology & Oncology

## 2017-05-23 DIAGNOSIS — C3431 Malignant neoplasm of lower lobe, right bronchus or lung: Secondary | ICD-10-CM

## 2017-05-24 ENCOUNTER — Other Ambulatory Visit: Payer: Self-pay | Admitting: Family Medicine

## 2017-05-26 MED ORDER — OMEPRAZOLE 20 MG PO CPDR: 20.0000 mg | DELAYED_RELEASE_CAPSULE | Freq: Two times a day (BID) | ORAL | 3 refills | Status: DC

## 2017-06-05 ENCOUNTER — Inpatient Hospital Stay: Payer: Medicare Other

## 2017-06-05 ENCOUNTER — Inpatient Hospital Stay: Payer: Medicare Other | Attending: Hematology & Oncology | Admitting: Family

## 2017-06-05 ENCOUNTER — Other Ambulatory Visit: Payer: Self-pay

## 2017-06-05 VITALS — BP 149/69 | HR 106 | Temp 98.0°F | Resp 18 | Wt 91.5 lb

## 2017-06-05 DIAGNOSIS — C3491 Malignant neoplasm of unspecified part of right bronchus or lung: Secondary | ICD-10-CM

## 2017-06-05 DIAGNOSIS — Z95828 Presence of other vascular implants and grafts: Secondary | ICD-10-CM

## 2017-06-05 DIAGNOSIS — C3431 Malignant neoplasm of lower lobe, right bronchus or lung: Secondary | ICD-10-CM

## 2017-06-05 DIAGNOSIS — D500 Iron deficiency anemia secondary to blood loss (chronic): Secondary | ICD-10-CM | POA: Diagnosis not present

## 2017-06-05 LAB — CBC WITH DIFFERENTIAL (CANCER CENTER ONLY)
BASOS ABS: 0.2 10*3/uL — AB (ref 0.0–0.1)
BASOS PCT: 1 %
EOS ABS: 0 10*3/uL (ref 0.0–0.5)
Eosinophils Relative: 0 %
HEMATOCRIT: 33.9 % — AB (ref 34.8–46.6)
Hemoglobin: 10.8 g/dL — ABNORMAL LOW (ref 11.6–15.9)
Lymphocytes Relative: 16 %
Lymphs Abs: 2 10*3/uL (ref 0.9–3.3)
MCH: 32.3 pg (ref 26.0–34.0)
MCHC: 31.9 g/dL — ABNORMAL LOW (ref 32.0–36.0)
MCV: 101.5 fL — ABNORMAL HIGH (ref 81.0–101.0)
MONO ABS: 1.3 10*3/uL — AB (ref 0.1–0.9)
Monocytes Relative: 10 %
NEUTROS PCT: 73 %
Neutro Abs: 9.6 10*3/uL — ABNORMAL HIGH (ref 1.5–6.5)
Platelet Count: 387 10*3/uL (ref 145–400)
RBC: 3.34 MIL/uL — ABNORMAL LOW (ref 3.70–5.32)
RDW: 15.3 % (ref 11.1–15.7)
WBC Count: 13.1 10*3/uL — ABNORMAL HIGH (ref 3.9–10.0)

## 2017-06-05 LAB — CMP (CANCER CENTER ONLY)
ALBUMIN: 2.8 g/dL — AB (ref 3.5–5.0)
ALT: 16 U/L (ref 10–47)
AST: 21 U/L (ref 11–38)
Alkaline Phosphatase: 75 U/L (ref 26–84)
Anion gap: 5 (ref 5–15)
BILIRUBIN TOTAL: 0.4 mg/dL (ref 0.2–1.6)
BUN: 35 mg/dL — AB (ref 7–22)
CALCIUM: 8.2 mg/dL (ref 8.0–10.3)
CHLORIDE: 107 mmol/L (ref 98–108)
CO2: 29 mmol/L (ref 18–33)
CREATININE: 1.5 mg/dL — AB (ref 0.60–1.20)
Glucose, Bld: 129 mg/dL — ABNORMAL HIGH (ref 73–118)
Potassium: 3.6 mmol/L (ref 3.3–4.7)
SODIUM: 141 mmol/L (ref 128–145)
Total Protein: 6.1 g/dL — ABNORMAL LOW (ref 6.4–8.1)

## 2017-06-05 LAB — IRON AND TIBC
IRON: 84 ug/dL (ref 41–142)
Saturation Ratios: 39 % (ref 21–57)
TIBC: 214 ug/dL — ABNORMAL LOW (ref 236–444)
UIBC: 130 ug/dL

## 2017-06-05 LAB — FERRITIN: Ferritin: 177 ng/mL (ref 9–269)

## 2017-06-05 MED ORDER — SODIUM CHLORIDE 0.9% FLUSH
10.0000 mL | INTRAVENOUS | Status: DC | PRN
  Administered 2017-06-05: 10 mL via INTRAVENOUS
  Filled 2017-06-05: qty 10

## 2017-06-05 MED ORDER — HEPARIN SOD (PORK) LOCK FLUSH 100 UNIT/ML IV SOLN
500.0000 [IU] | Freq: Once | INTRAVENOUS | Status: AC
  Administered 2017-06-05: 500 [IU] via INTRAVENOUS
  Filled 2017-06-05: qty 5

## 2017-06-05 NOTE — Progress Notes (Addendum)
Hematology and Oncology Follow Up Visit  Marissa Cruz [id number] 12/25/1934 83 y.o. 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 Hickam is here today with her daughter for follow-up. She is feeling much better but still having some fatigue and chills occasionally.  Her last episode of diarrhea was 3 days ago. She only had one occurrence that day.  She has some lingering sinus congestion with cough and yellow sputum off and on. She feels that this is improving. She is using a nasal spray and over the counter allergy medicine to treat.  No fever, n/v, rash, dizziness, SOB, chest pain, palpitations, abdominal pain or changes in bowel or bladder habits.  No lymphadenopathy found on exam.  No swelling, tenderness, numbness or tingling in her extremities.  No episodes of bleeding, no bruising or petechiae.  She has no appetite but is supplementing with 2 carnation instant breakfast high protein shakes daily. Her weight is up 3 lbs. She is doing her best to hydrate at home.    ECOG Performance Status: 2 - Symptomatic, <50% confined to bed  Medications:  Allergies as of 06/05/2017      Reactions   Morphine And Related Other (See Comments)   Hallucinations and combative   Codeine Nausea Only      Medication List        Accurate as of 06/05/17 11:35 AM. Always use your most recent med list.          allopurinol 100 MG tablet Commonly known as:  ZYLOPRIM TAKE 1 TABLET BY MOUTH AT BEDTIME   ALPRAZolam 0.25 MG tablet Commonly known as:  XANAX Take 1 tablet (0.25 mg total) by mouth 3 (three) times daily as needed for anxiety.   aspirin 81 MG  tablet Take 81 mg by mouth daily.   cetirizine 10 MG tablet Commonly known as:  ZYRTEC Take 10 mg by mouth at bedtime.   diphenoxylate-atropine 2.5-0.025 MG tablet Commonly known as:  LOMOTIL Take 1 tablet by mouth 4 (four) times daily as needed for diarrhea or loose stools.   ferrous sulfate 325 (65 FE) MG EC tablet Take 325 mg by mouth 2 (two) times daily.   HM MULTIVITAMIN ADULT GUMMY Chew Chew 2 tablets by mouth every morning.   lidocaine-prilocaine cream Commonly known as:  EMLA APPLY EXTERNALLY TO THE AFFECTED AREA 1 TIME   LORazepam 0.5 MG tablet Commonly known as:  ATIVAN Take 1 tablet (0.5 mg total) by mouth every 6 (six) hours as needed (Nausea or vomiting).   megestrol 40 MG/ML suspension Commonly known as:  MEGACE TAKE 10 ML BY MOUTH DAILY   metoprolol succinate 25 MG 24 hr tablet Commonly known as:  TOPROL-XL TAKE 1 TABLET(25 MG) BY MOUTH EVERY MORNING   mirtazapine 15 MG disintegrating tablet Commonly known as:  REMERON SOL-TAB DISSOLVE 1 TABLET UNDER THE TONGUE DAILY AT BEDTIME   nicotine 14 mg/24hr patch Commonly known as:  NICODERM CQ - dosed in mg/24 hours Place 14 mg onto the skin daily.   omeprazole 20 MG capsule Commonly known as:  PRILOSEC Take 1 capsule (20 mg total) by mouth 2 (two) times daily before a meal.  ondansetron 8 MG disintegrating tablet Commonly known as:  ZOFRAN-ODT Take 1 tablet (8 mg total) by mouth every 8 (eight) hours as needed for nausea or vomiting. Reported on 05/17/2015   oxandrolone 10 MG tablet Commonly known as:  OXANDRIN Take 1 tablet (10 mg total) by mouth daily with breakfast.   PARoxetine 10 MG tablet Commonly known as:  PAXIL TAKE 1 TABLET BY MOUTH DAILY FOR DEPRESSION   [name] 0.625 MG tablet Generic drug:  estrogens (conjugated) TAKE 1 TABLET BY MOUTH EVERY DAY FOR 21 DAYS. DO NOT TAKE FOR 7 DAYS   traMADol 50 MG tablet Commonly known as:  ULTRAM Take 1 tablet (50 mg total) by mouth every 6 (six)  hours as needed.   traZODone 50 MG tablet Commonly known as:  DESYREL TAKE 1 TABLET BY MOUTH AT BEDTIME       Allergies:  Allergies  Allergen Reactions  . Morphine And Related Other (See Comments)    Hallucinations and combative  . Codeine Nausea Only    Past Medical History, Surgical history, Social history, and Family History were reviewed and updated.  Review of Systems: All other 10 point review of systems is negative.   Physical Exam:  weight is 91 lb 8 oz (41.5 kg). Her oral temperature is 98 F (36.7 C). Her blood pressure is 149/69 (abnormal) and her pulse is 106 (abnormal). Her respiration is 18 and oxygen saturation is 96%.   Wt Readings from Last 3 Encounters:  06/05/17 91 lb 8 oz (41.5 kg)  05/15/17 88 lb 12 oz (40.3 kg)  05/09/17 91 lb 12.8 oz (41.6 kg)    Ocular: Sclerae unicteric, pupils equal, round and reactive to light Ear-nose-throat: Oropharynx clear, dentition fair Lymphatic: No cervical, supraclavicular or axillary adenopathy Lungs no rales or rhonchi, good excursion bilaterally Heart regular rate and rhythm, no murmur appreciated Abd soft, nontender, positive bowel sounds, no liver or spleen tip palpated on exam, no fluid wave  MSK no focal spinal tenderness, no joint edema Neuro: non-focal, well-oriented, appropriate affect Breasts: Deferred   Lab Results  Component Value Date   WBC 13.1 (H) 06/05/2017   HGB 10.6 (L) 01/30/2017   HCT 33.9 (L) 06/05/2017   MCV 101.5 (H) 06/05/2017   PLT 387 06/05/2017   Lab Results  Component Value Date   FERRITIN 145 04/17/2017   IRON 87 04/17/2017   TIBC 235 (L) 04/17/2017   UIBC 148 04/17/2017   IRONPCTSAT 37 04/17/2017   Lab Results  Component Value Date   RBC 3.34 (L) 06/05/2017   No results found for: KPAFRELGTCHN, LAMBDASER, KAPLAMBRATIO No results found for: IGGSERUM, IGA, IGMSERUM No results found for: Ronnald Ramp, A1GS, A2GS, Violet Baldy, MSPIKE, SPEI   Chemistry       Component Value Date/Time   NA 142 05/15/2017 1100   NA 140 01/30/2017 1138   NA 136 03/07/2016 1055   K 3.9 05/15/2017 1100   K 4.2 01/30/2017 1138   K 4.1 03/07/2016 1055   CL 114 (H) 05/15/2017 1100   CL 102 01/30/2017 1138   CO2 23 05/15/2017 1100   CO2 28 01/30/2017 1138   CO2 26 03/07/2016 1055   BUN 26 (H) 05/15/2017 1100   BUN 34 (H) 01/30/2017 1138   BUN 28.7 (H) 03/07/2016 1055   CREATININE 2.30 (H) 05/15/2017 1100   CREATININE 2.7 (H) 01/30/2017 1138   CREATININE 1.3 (H) 03/07/2016 1055      Component Value Date/Time   CALCIUM 9.0 05/15/2017  1100   CALCIUM 9.3 01/30/2017 1138   CALCIUM 9.5 03/07/2016 1055   ALKPHOS 69 05/15/2017 1100   ALKPHOS 59 01/30/2017 1138   ALKPHOS 83 03/07/2016 1055   AST 25 05/15/2017 1100   AST 17 03/07/2016 1055   ALT 18 05/15/2017 1100   ALT 16 01/30/2017 1138   ALT 9 03/07/2016 1055   BILITOT 0.4 05/15/2017 1100   BILITOT 0.26 03/07/2016 1055      Impression and Plan: Ms. Hush is a very pleasant 83 yo caucasian female with locally advanced, inoperable, stage IIIB squamous cel carcinoma of the right lung, with high PD-L1 score.  She has recuperated from C-diff and is getting over a sinus infection. She states that she is feeling much better.  She does not need fluids today.  We will continue to follow along with her and plan to see her back in another month for follow-up.  They will contact our office with any questions or concerns. We can certainly see her sooner if need be.   Laverna Peace, NP 4/4/201911:35 AM

## 2017-06-05 NOTE — Patient Instructions (Signed)

## 2017-06-12 ENCOUNTER — Other Ambulatory Visit: Payer: Self-pay | Admitting: Family Medicine

## 2017-06-23 ENCOUNTER — Encounter: Payer: Self-pay | Admitting: Family Medicine

## 2017-06-26 ENCOUNTER — Encounter: Payer: Self-pay | Admitting: Family Medicine

## 2017-06-26 ENCOUNTER — Ambulatory Visit (INDEPENDENT_AMBULATORY_CARE_PROVIDER_SITE_OTHER): Payer: Medicare Other | Admitting: Family Medicine

## 2017-06-26 ENCOUNTER — Other Ambulatory Visit: Payer: Self-pay | Admitting: Hematology & Oncology

## 2017-06-26 VITALS — BP 132/76 | HR 118 | Temp 98.3°F | Resp 16 | Ht 67.0 in | Wt 91.2 lb

## 2017-06-26 DIAGNOSIS — J0140 Acute pansinusitis, unspecified: Secondary | ICD-10-CM | POA: Diagnosis not present

## 2017-06-26 DIAGNOSIS — C3431 Malignant neoplasm of lower lobe, right bronchus or lung: Secondary | ICD-10-CM

## 2017-06-26 MED ORDER — PREDNISONE 10 MG PO TABS: ORAL_TABLET | ORAL | 0 refills | Status: DC

## 2017-06-26 MED ORDER — AZITHROMYCIN 250 MG PO TABS: ORAL_TABLET | ORAL | 0 refills | Status: DC

## 2017-06-26 NOTE — Progress Notes (Signed)
Patient ID: Marissa Cruz, female   DOB: 1935-01-10, 83 y.o.   MRN: [id number]    Subjective:  I acted as a Education administrator for Dr. Carollee Herter.  Marissa Cruz, [place]   Patient ID: Marissa Cruz, female    DOB: 12/17/1934, 83 y.o.   MRN: [id number]  Chief Complaint  Patient presents with  . Sinus Problem    Sinus Problem  This is a new problem. The current episode started more than 1 month ago. Associated symptoms include congestion (nasal and congestion), coughing, ear pain (right ear), sinus pressure and a sore throat. Pertinent negatives include no chills, headaches, hoarse voice or shortness of breath. Treatments tried: nasal spray, zyrtec, cough drops, ear drops.    Patient is in today for sinus problems.  Patient Care Team: Carollee Herter, Alferd Apa, DO as PCP - General (Family Medicine) Ladene Artist, MD as Consulting Physician (Gastroenterology) Michaela Corner, DDS as Consulting Physician (Oral Surgery) Renda Rolls Jennefer Bravo, MD as Referring Physician (Dermatology) Milus Height, MD as Referring Physician (Ophthalmology)   Past Medical History:  Diagnosis Date  . Age-related macular degeneration, dry, right eye   . Age-related macular degeneration, wet, left eye ([place])   . Anemia   . Anxiety   . Arthritis    "thumbs, hands" (08/18/2014)  . CAP (community acquired pneumonia) 08/19/2014  . Carotid artery disease ([place])   . Chicken pox   . Chronic lower back pain   . Colon polyp   . Depression   . Diverticulitis   . GERD (gastroesophageal reflux disease)   . Goals of care, counseling/discussion 03/07/2016  . History of blood transfusion 1976; 2013   "w/hysterectomy; hip OR"  . History of gout   . Hypertension   . Iron deficiency anemia due to chronic blood loss 06/06/2016  . 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 Kidney disease   . 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 TIA (transient ischemic attack) ~ 2009 X 2  . Vocal cord cancer ([place]) ~ 2009   S/P  radiation    Past Surgical History:  Procedure Laterality Date  . ABDOMINAL HYSTERECTOMY  1976  . APPENDECTOMY  1954  . CAROTID ENDARTERECTOMY Left ~ 2014  . CATARACT EXTRACTION W/ INTRAOCULAR LENS  IMPLANT, BILATERAL Bilateral ~ 2009  . COLONOSCOPY N/A 01/31/2014   Procedure: COLONOSCOPY;  Surgeon: Ladene Artist, MD;  Location: [facility];  Service: Endoscopy;  Laterality: N/A;  . COLONOSCOPY N/A 07/15/2016   Procedure: COLONOSCOPY;  Surgeon: Milus Banister, MD;  Location: [facility];  Service: Endoscopy;  Laterality: N/A;  . DILATION AND CURETTAGE OF UTERUS    . IR GENERIC HISTORICAL  03/12/2016   IR US GUIDE VASC ACCESS RIGHT 03/12/2016 Jacqulynn Cadet, MD [facility]  . IR GENERIC HISTORICAL  03/12/2016   IR FLUORO GUIDE PORT INSERTION RIGHT 03/12/2016 Jacqulynn Cadet, MD [facility]  . JOINT REPLACEMENT    . MOUTH FLOOR MASS EXCISION  03/2014   "removed cancer underneath my tongue"  . TONSILLECTOMY  1941  . TOTAL HIP ARTHROPLASTY Left 2013    Family History  Problem Relation Age of Onset  . Hypertension Father   . Skin cancer Father   . Emphysema Paternal Grandfather   . Liver cancer Mother   . Stomach cancer Maternal Grandfather     Social History   Socioeconomic History  . Marital status: Widowed    Spouse name: Not on file  . Number of children: 1  . Years  of education: Not on file  . Highest education level: Not on file  Occupational History  . Occupation: Retired  Scientific laboratory technician  . Financial resource strain: Not on file  . Food insecurity:    Worry: Not on file    Inability: Not on file  . Transportation needs:    Medical: Not on file    Non-medical: Not on file  Tobacco Use  . Smoking status: Light Tobacco Smoker    Packs/day: 1.00    Years: 60.00    Pack years: 60.00    Types: Cigarettes    Last attempt to quit: 05/15/2016    Years since quitting: 1.1  . Smokeless tobacco: Never Used  . Tobacco comment: states "one cigarette a week"    Substance and Sexual Activity  . Alcohol use: Yes    Alcohol/week: 4.2 oz    Types: 7 Shots of liquor per week    Comment:  " shots brandy q night"  . Drug use: No  . Sexual activity: Not Currently  Lifestyle  . Physical activity:    Days per week: Not on file    Minutes per session: Not on file  . Stress: Not on file  Relationships  . Social connections:    Talks on phone: Not on file    Gets together: Not on file    Attends religious service: Not on file    Active member of club or organization: Not on file    Attends meetings of clubs or organizations: Not on file    Relationship status: Not on file  . Intimate partner violence:    Fear of current or ex partner: Not on file    Emotionally abused: Not on file    Physically abused: Not on file    Forced sexual activity: Not on file  Other Topics Concern  . Not on file  Social History Narrative  . Not on file    Outpatient Medications Prior to Visit  Medication Sig Dispense Refill  . allopurinol (ZYLOPRIM) 100 MG tablet Take 1 tablet (100 mg total) by mouth at bedtime. 90 tablet 0  . ALPRAZolam (XANAX) 0.25 MG tablet Take 1 tablet (0.25 mg total) by mouth 3 (three) times daily as needed for anxiety. 30 tablet 0  . aspirin 81 MG tablet Take 81 mg by mouth daily.    . cetirizine (ZYRTEC) 10 MG tablet Take 10 mg by mouth at bedtime.    . diphenoxylate-atropine (LOMOTIL) 2.5-0.025 MG tablet Take 1 tablet by mouth 4 (four) times daily as needed for diarrhea or loose stools. 100 tablet 0  . lidocaine-prilocaine (EMLA) cream APPLY EXTERNALLY TO THE AFFECTED AREA 1 TIME 30 g 0  . LORazepam (ATIVAN) 0.5 MG tablet Take 1 tablet (0.5 mg total) by mouth every 6 (six) hours as needed (Nausea or vomiting). 30 tablet 0  . metoprolol succinate (TOPROL-XL) 25 MG 24 hr tablet TAKE 1 TABLET(25 MG) BY MOUTH EVERY MORNING 90 tablet 0  . mirtazapine (REMERON SOL-TAB) 15 MG disintegrating tablet DISSOLVE 1 TABLET UNDER THE TONGUE DAILY AT BEDTIME  30 tablet 0  . Multiple Vitamins-Minerals (HM MULTIVITAMIN ADULT GUMMY) CHEW Chew 2 tablets by mouth every morning.    . nicotine (NICODERM CQ - DOSED IN MG/24 HR) 7 mg/24hr patch Place 7 mg onto the skin daily.    Marland Kitchen omeprazole (PRILOSEC) 20 MG capsule Take 1 capsule (20 mg total) by mouth 2 (two) times daily before a meal. 60 capsule 3  . ondansetron (  ZOFRAN-ODT) 8 MG disintegrating tablet Take 1 tablet (8 mg total) by mouth every 8 (eight) hours as needed for nausea or vomiting. Reported on 05/17/2015 90 tablet 5  . oxandrolone (OXANDRIN) 10 MG tablet Take 1 tablet (10 mg total) by mouth daily with breakfast. 30 tablet 2  . PARoxetine (PAXIL) 10 MG tablet TAKE 1 TABLET BY MOUTH DAILY FOR DEPRESSION 90 tablet 0  . [name] 0.625 MG tablet TAKE 1 TABLET BY MOUTH EVERY DAY FOR 21 DAYS. DO NOT TAKE FOR 7 DAYS 90 tablet 0  . traMADol (ULTRAM) 50 MG tablet Take 1 tablet (50 mg total) by mouth every 6 (six) hours as needed. 60 tablet 0  . traZODone (DESYREL) 50 MG tablet TAKE 1 TABLET BY MOUTH AT BEDTIME 90 tablet 0  . megestrol (MEGACE) 40 MG/ML suspension TAKE 10 ML BY MOUTH DAILY 480 mL 0  . ferrous sulfate 325 (65 FE) MG EC tablet Take 325 mg by mouth 2 (two) times daily.    . nicotine (NICODERM CQ - DOSED IN MG/24 HOURS) 14 mg/24hr patch Place 14 mg onto the skin daily.     Facility-Administered Medications Prior to Visit  Medication Dose Route Frequency Provider Last Rate Last Dose  . sodium chloride flush (NS) 0.9 % injection 10 mL  10 mL Intracatheter PRN Volanda Napoleon, MD   10 mL at 04/25/16 1355  . sodium chloride flush (NS) 0.9 % injection 10 mL  10 mL Intravenous PRN Volanda Napoleon, MD   10 mL at 03/20/17 1151    Allergies  Allergen Reactions  . Augmentin [Amoxicillin-Pot Clavulanate] Diarrhea    c diff  . Morphine And Related Other (See Comments)    Hallucinations and combative  . Codeine Nausea Only    Review of Systems  Constitutional: Negative for chills, fever and  malaise/fatigue.  HENT: Positive for congestion (nasal and congestion), ear pain (right ear), sinus pressure and sore throat. Negative for hoarse voice.   Eyes: Negative for blurred vision.  Respiratory: Positive for cough. Negative for shortness of breath.   Cardiovascular: Negative for chest pain, palpitations and leg swelling.  Gastrointestinal: Negative for vomiting.  Musculoskeletal: Negative for back pain.  Skin: Negative for rash.  Neurological: Negative for loss of consciousness and headaches.       Objective:    Physical Exam  Constitutional: She is oriented to person, place, and time. She appears well-developed and well-nourished.  HENT:  Right Ear: External ear normal. There is tenderness. Tympanic membrane is injected. A middle ear effusion is present.  Left Ear: External ear normal.  + PND + errythema  Eyes: Conjunctivae are normal. Right eye exhibits no discharge. Left eye exhibits no discharge.  Cardiovascular: Normal rate, regular rhythm and normal heart sounds.  No murmur heard. Pulmonary/Chest: Effort normal and breath sounds normal. No respiratory distress. She has no wheezes. She has no rales. She exhibits no tenderness.  Musculoskeletal: She exhibits no edema.  Lymphadenopathy:    She has cervical adenopathy.  Neurological: She is alert and oriented to person, place, and time.  Nursing note and vitals reviewed.   BP 132/76 (BP Location: Right Arm, Cuff Size: Normal)   Pulse (!) 118   Temp 98.3 F (36.8 C) (Oral)   Resp 16   Ht _0  (1.702 m)   Wt 91 lb 3.2 oz (41.4 kg)   SpO2 96%   BMI 14.28 kg/m  Wt Readings from Last 3 Encounters:  06/26/17 91 lb 3.2 oz (41.4  kg)  06/05/17 91 lb 8 oz (41.5 kg)  05/15/17 88 lb 12 oz (40.3 kg)   BP Readings from Last 3 Encounters:  06/26/17 132/76  06/05/17 (!) 149/69  05/15/17 109/61     Immunization History  Administered Date(s) Administered  . Influenza, High Dose Seasonal PF 11/17/2014, 11/21/2015,  01/02/2017  . Influenza-Unspecified 12/02/2013  . Pneumococcal Conjugate-13 10/21/2013, 11/17/2014  . Pneumococcal Polysaccharide-23 10/21/2013  . Tdap 05/02/2013  . Zoster 11/02/2013    Health Maintenance  Topic Date Due  . INFLUENZA VACCINE  10/02/2017  . TETANUS/TDAP  05/03/2023  . DEXA SCAN  Completed  . PNA vac Low Risk Adult  Completed    Lab Results  Component Value Date   WBC 13.1 (H) 06/05/2017   HGB 10.8 (L) 06/05/2017   HCT 33.9 (L) 06/05/2017   PLT 387 06/05/2017   GLUCOSE 129 (H) 06/05/2017   CHOL 183 11/21/2015   TRIG 124.0 11/21/2015   HDL 94.30 11/21/2015   LDLDIRECT 73.2 10/21/2013   LDLCALC 64 11/21/2015   ALT 16 06/05/2017   AST 21 06/05/2017   NA 141 06/05/2017   K 3.6 06/05/2017   CL 107 06/05/2017   CREATININE 1.50 (H) 06/05/2017   BUN 35 (H) 06/05/2017   CO2 29 06/05/2017   TSH 3.535 09/06/2016   INR 1.00 07/13/2016    Lab Results  Component Value Date   TSH 3.535 09/06/2016   Lab Results  Component Value Date   WBC 13.1 (H) 06/05/2017   HGB 10.8 (L) 06/05/2017   HCT 33.9 (L) 06/05/2017   MCV 101.5 (H) 06/05/2017   PLT 387 06/05/2017   Lab Results  Component Value Date   NA 141 06/05/2017   K 3.6 06/05/2017   CHLORIDE 101 03/07/2016   CO2 29 06/05/2017   GLUCOSE 129 (H) 06/05/2017   BUN 35 (H) 06/05/2017   CREATININE 1.50 (H) 06/05/2017   BILITOT 0.4 06/05/2017   ALKPHOS 75 06/05/2017   AST 21 06/05/2017   ALT 16 06/05/2017   PROT 6.1 (L) 06/05/2017   ALBUMIN 2.8 (L) 06/05/2017   CALCIUM 8.2 06/05/2017   ANIONGAP 5 06/05/2017   EGFR 38 (L) 03/07/2016   GFR 41.62 (L) 12/21/2015   Lab Results  Component Value Date   CHOL 183 11/21/2015   Lab Results  Component Value Date   HDL 94.30 11/21/2015   Lab Results  Component Value Date   LDLCALC 64 11/21/2015   Lab Results  Component Value Date   TRIG 124.0 11/21/2015   Lab Results  Component Value Date   CHOLHDL 2 11/21/2015   No results found for: HGBA1C        Assessment & Plan:   Problem List Items Addressed This Visit      Unprioritized   Acute pansinusitis - Primary    flonase  And otc antihistamine abx per orders pred taper       Relevant Medications   azithromycin (ZITHROMAX Z-PAK) 250 MG tablet   predniSONE (DELTASONE) 10 MG tablet      I have discontinued Brendi Parkinson's ferrous sulfate. I am also having her start on azithromycin and predniSONE. Additionally, I am having her maintain her aspirin, HM MULTIVITAMIN ADULT GUMMY, ondansetron, LORazepam, cetirizine, ALPRAZolam, traMADol, oxandrolone, traZODone, [name], diphenoxylate-atropine, lidocaine-prilocaine, mirtazapine, metoprolol succinate, PARoxetine, omeprazole, allopurinol, and nicotine.  Meds ordered this encounter  Medications  . azithromycin (ZITHROMAX Z-PAK) 250 MG tablet    Sig: As directed    Dispense:  6 each  Refill:  0  . predniSONE (DELTASONE) 10 MG tablet    Sig: TAKE 3 TABLETS PO QD FOR 3 DAYS THEN TAKE 2 TABLETS PO QD FOR 3 DAYS THEN TAKE 1 TABLET PO QD FOR 3 DAYS THEN TAKE 1/2 TAB PO QD FOR 3 DAYS    Dispense:  20 tablet    Refill:  0    CMA served as Education administrator during this visit. History, Physical and Plan performed by medical provider. Documentation and orders reviewed and attested to.  Ann Held, DO

## 2017-06-26 NOTE — Patient Instructions (Signed)

## 2017-06-26 NOTE — Assessment & Plan Note (Signed)
flonase  And otc antihistamine abx per orders pred taper

## 2017-07-03 ENCOUNTER — Inpatient Hospital Stay: Payer: Medicare Other | Attending: Hematology & Oncology | Admitting: Family

## 2017-07-03 ENCOUNTER — Encounter: Payer: Self-pay | Admitting: Family

## 2017-07-03 ENCOUNTER — Other Ambulatory Visit: Payer: Self-pay

## 2017-07-03 ENCOUNTER — Other Ambulatory Visit: Payer: Self-pay | Admitting: Family Medicine

## 2017-07-03 ENCOUNTER — Inpatient Hospital Stay: Payer: Medicare Other

## 2017-07-03 VITALS — BP 155/72 | HR 100 | Temp 98.1°F | Wt 93.0 lb

## 2017-07-03 DIAGNOSIS — C3431 Malignant neoplasm of lower lobe, right bronchus or lung: Secondary | ICD-10-CM

## 2017-07-03 DIAGNOSIS — R7989 Other specified abnormal findings of blood chemistry: Secondary | ICD-10-CM | POA: Insufficient documentation

## 2017-07-03 DIAGNOSIS — D500 Iron deficiency anemia secondary to blood loss (chronic): Secondary | ICD-10-CM

## 2017-07-03 DIAGNOSIS — Z95828 Presence of other vascular implants and grafts: Secondary | ICD-10-CM

## 2017-07-03 LAB — CBC WITH DIFFERENTIAL (CANCER CENTER ONLY)
BASOS PCT: 0 %
Basophils Absolute: 0 10*3/uL (ref 0.0–0.1)
Eosinophils Absolute: 0 10*3/uL (ref 0.0–0.5)
Eosinophils Relative: 0 %
HCT: 37.6 % (ref 34.8–46.6)
Hemoglobin: 12.2 g/dL (ref 11.6–15.9)
Lymphocytes Relative: 8 %
Lymphs Abs: 1.6 10*3/uL (ref 0.9–3.3)
MCH: 32.7 pg (ref 26.0–34.0)
MCHC: 32.4 g/dL (ref 32.0–36.0)
MCV: 100.8 fL (ref 81.0–101.0)
MONO ABS: 0.9 10*3/uL (ref 0.1–0.9)
MONOS PCT: 5 %
NEUTROS ABS: 16.6 10*3/uL — AB (ref 1.5–6.5)
Neutrophils Relative %: 87 %
Platelet Count: 485 10*3/uL — ABNORMAL HIGH (ref 145–400)
RBC: 3.73 MIL/uL (ref 3.70–5.32)
RDW: 14 % (ref 11.1–15.7)
WBC Count: 19.1 10*3/uL — ABNORMAL HIGH (ref 3.9–10.0)

## 2017-07-03 LAB — CMP (CANCER CENTER ONLY)
ALBUMIN: 3.4 g/dL — AB (ref 3.5–5.0)
ALT: 31 U/L (ref 10–47)
ANION GAP: 5 (ref 5–15)
AST: 26 U/L (ref 11–38)
Alkaline Phosphatase: 62 U/L (ref 26–84)
BUN: 71 mg/dL — ABNORMAL HIGH (ref 7–22)
CHLORIDE: 107 mmol/L (ref 98–108)
CO2: 32 mmol/L (ref 18–33)
CREATININE: 1.7 mg/dL — AB (ref 0.60–1.20)
Calcium: 9.5 mg/dL (ref 8.0–10.3)
Glucose, Bld: 104 mg/dL (ref 73–118)
POTASSIUM: 5.1 mmol/L — AB (ref 3.3–4.7)
Sodium: 144 mmol/L (ref 128–145)
Total Bilirubin: 0.5 mg/dL (ref 0.2–1.6)
Total Protein: 6.8 g/dL (ref 6.4–8.1)

## 2017-07-03 MED ORDER — HEPARIN SOD (PORK) LOCK FLUSH 100 UNIT/ML IV SOLN
500.0000 [IU] | Freq: Once | INTRAVENOUS | Status: AC
  Administered 2017-07-03: 500 [IU] via INTRAVENOUS
  Filled 2017-07-03: qty 5

## 2017-07-03 MED ORDER — SODIUM CHLORIDE 0.9% FLUSH
10.0000 mL | INTRAVENOUS | Status: DC | PRN
  Administered 2017-07-03: 10 mL via INTRAVENOUS
  Filled 2017-07-03: qty 10

## 2017-07-03 NOTE — Progress Notes (Signed)
Hematology and Oncology Follow Up Visit  Marissa Cruz [id number] 05-Jul-1934 83 y.o. 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 Overall Marissa Cruz seems to be doing well.  Marissa Cruz sinus infection has almost completely resolved. Marissa Cruz finished Marissa Cruz antibiotic yesterday and has almost completed Marissa Cruz prednisone.  Marissa Cruz states that Marissa Cruz is not crazy about Marissa Cruz living situation but does not want to move to assisted living.  Marissa Cruz is doing well making transfers from Marissa Cruz wheel chair. No falls or syncopal episodes.  No swelling, tenderness, numbness or tingling in Marissa Cruz extremities at this time.  No fever, chills, n/v, cough, rash, dizziness, headache, SOB, chest pain, palpitations, abdominal pain or changes in bowel or bladder habits.  No episodes of bleeding, no bruising or petechiae. No lymphadenopathy found on exam.  Marissa Cruz is eating well now and having carnation instant breakfast with extra protein twice a day. Marissa Cruz weight is up another 2 lbs. Marissa Cruz feels that Marissa Cruz could better hydrate at home.    ECOG Performance Status: 2 - Symptomatic, <50% confined to bed  Medications:  Allergies as of 07/03/2017      Reactions   Augmentin [amoxicillin-pot Clavulanate] Diarrhea   c diff   Morphine And Related Other (See Comments)   Hallucinations and combative   Codeine Nausea Only      Medication List        Accurate as of 07/03/17  1:32 PM. Always use your most recent med list.          allopurinol 100 MG tablet Commonly known as:  ZYLOPRIM Take 1 tablet (100 mg total) by mouth at bedtime.   ALPRAZolam 0.25 MG  tablet Commonly known as:  XANAX Take 1 tablet (0.25 mg total) by mouth 3 (three) times daily as needed for anxiety.   aspirin 81 MG tablet Take 81 mg by mouth daily.   azithromycin 250 MG tablet Commonly known as:  ZITHROMAX Z-PAK As directed   cetirizine 10 MG tablet Commonly known as:  ZYRTEC Take 10 mg by mouth at bedtime.   diphenoxylate-atropine 2.5-0.025 MG tablet Commonly known as:  LOMOTIL Take 1 tablet by mouth 4 (four) times daily as needed for diarrhea or loose stools.   HM MULTIVITAMIN ADULT GUMMY Chew Chew 2 tablets by mouth every morning.   lidocaine-prilocaine cream Commonly known as:  EMLA APPLY EXTERNALLY TO THE AFFECTED AREA 1 TIME   LORazepam 0.5 MG tablet Commonly known as:  ATIVAN Take 1 tablet (0.5 mg total) by mouth every 6 (six) hours as needed (Nausea or vomiting).   megestrol 40 MG/ML suspension Commonly known as:  MEGACE TAKE 10 ML BY MOUTH DAILY   metoprolol succinate 25 MG 24 hr tablet Commonly known as:  TOPROL-XL TAKE 1 TABLET(25 MG) BY MOUTH EVERY MORNING   mirtazapine 15 MG disintegrating tablet Commonly known as:  REMERON SOL-TAB DISSOLVE 1 TABLET UNDER THE TONGUE DAILY AT BEDTIME   nicotine 7 mg/24hr patch Commonly known as:  NICODERM CQ - dosed in mg/24 hr Place 7 mg onto the skin daily.  omeprazole 20 MG capsule Commonly known as:  PRILOSEC Take 1 capsule (20 mg total) by mouth 2 (two) times daily before a meal.   ondansetron 8 MG disintegrating tablet Commonly known as:  ZOFRAN-ODT Take 1 tablet (8 mg total) by mouth every 8 (eight) hours as needed for nausea or vomiting. Reported on 05/17/2015   oxandrolone 10 MG tablet Commonly known as:  OXANDRIN Take 1 tablet (10 mg total) by mouth daily with breakfast.   PARoxetine 10 MG tablet Commonly known as:  PAXIL TAKE 1 TABLET BY MOUTH DAILY FOR DEPRESSION   predniSONE 10 MG tablet Commonly known as:  DELTASONE TAKE 3 TABLETS PO QD FOR 3 DAYS THEN TAKE 2 TABLETS PO QD  FOR 3 DAYS THEN TAKE 1 TABLET PO QD FOR 3 DAYS THEN TAKE 1/2 TAB PO QD FOR 3 DAYS   [name] 0.625 MG tablet Generic drug:  estrogens (conjugated) TAKE 1 TABLET BY MOUTH EVERY DAY FOR 21 DAYS. DO NOT TAKE FOR 7 DAYS   traMADol 50 MG tablet Commonly known as:  ULTRAM Take 1 tablet (50 mg total) by mouth every 6 (six) hours as needed.   traZODone 50 MG tablet Commonly known as:  DESYREL TAKE 1 TABLET BY MOUTH AT BEDTIME       Allergies:  Allergies  Allergen Reactions  . Augmentin [Amoxicillin-Pot Clavulanate] Diarrhea    c diff  . Morphine And Related Other (See Comments)    Hallucinations and combative  . Codeine Nausea Only    Past Medical History, Surgical history, Social history, and Family History were reviewed and updated.  Review of Systems: All other 10 point review of systems is negative.   Physical Exam:  weight is 93 lb (42.2 kg). Marissa Cruz oral temperature is 98.1 F (36.7 C). Marissa Cruz blood pressure is 155/72 (abnormal) and Marissa Cruz pulse is 100. Marissa Cruz oxygen saturation is 100%.   Wt Readings from Last 3 Encounters:  07/03/17 93 lb (42.2 kg)  06/26/17 91 lb 3.2 oz (41.4 kg)  06/05/17 91 lb 8 oz (41.5 kg)    Ocular: Sclerae unicteric, pupils equal, round and reactive to light Ear-nose-throat: Oropharynx clear, dentition fair Lymphatic: No cervical, supraclavicular or axillary adenopathy Lungs no rales or rhonchi, good excursion bilaterally Heart regular rate and rhythm, no murmur appreciated Abd soft, nontender, positive bowel sounds, no liver or spleen tip palpated on exam, no fluid wave  MSK no focal spinal tenderness, no joint edema Neuro: non-focal, well-oriented, appropriate affect Breasts: Deferred   Lab Results  Component Value Date   WBC 19.1 (H) 07/03/2017   HGB 12.2 07/03/2017   HCT 37.6 07/03/2017   MCV 100.8 07/03/2017   PLT 485 (H) 07/03/2017   Lab Results  Component Value Date   FERRITIN 177 06/05/2017   IRON 84 06/05/2017   TIBC 214 (L)  06/05/2017   UIBC 130 06/05/2017   IRONPCTSAT 39 06/05/2017   Lab Results  Component Value Date   RBC 3.73 07/03/2017   No results found for: KPAFRELGTCHN, LAMBDASER, KAPLAMBRATIO No results found for: IGGSERUM, IGA, IGMSERUM No results found for: Odetta Pink, SPEI   Chemistry      Component Value Date/Time   NA 144 07/03/2017 1159   NA 140 01/30/2017 1138   NA 136 03/07/2016 1055   K 5.1 (H) 07/03/2017 1159   K 4.2 01/30/2017 1138   K 4.1 03/07/2016 1055   CL 107 07/03/2017 1159   CL 102 01/30/2017 1138  CO2 32 07/03/2017 1159   CO2 28 01/30/2017 1138   CO2 26 03/07/2016 1055   BUN 71 (H) 07/03/2017 1159   BUN 34 (H) 01/30/2017 1138   BUN 28.7 (H) 03/07/2016 1055   CREATININE 1.70 (H) 07/03/2017 1159   CREATININE 2.7 (H) 01/30/2017 1138   CREATININE 1.3 (H) 03/07/2016 1055      Component Value Date/Time   CALCIUM 9.5 07/03/2017 1159   CALCIUM 9.3 01/30/2017 1138   CALCIUM 9.5 03/07/2016 1055   ALKPHOS 62 07/03/2017 1159   ALKPHOS 59 01/30/2017 1138   ALKPHOS 83 03/07/2016 1055   AST 26 07/03/2017 1159   AST 17 03/07/2016 1055   ALT 31 07/03/2017 1159   ALT 16 01/30/2017 1138   ALT 9 03/07/2016 1055   BILITOT 0.5 07/03/2017 1159   BILITOT 0.26 03/07/2016 1055      Impression and Plan: Marissa Cruz is a very pleasant 83 yo caucasian female with locally advanced, inoperable, stage IIIb squamous cell carcinoma of the right lung, with high PD-L1 score.  Marissa Cruz is doing well over all and still gaining a little weight. Marissa Cruz promises to increase Marissa Cruz fluid intake, Marissa Cruz BUN and Creatinine remain elevated.  We will repeat a PET scan in 4 weeks and plan to see Marissa Cruz back for follow-up in 6 weeks.  They will contact our office with any questions or concerns. We can certainly see Marissa Cruz sooner if need be.   Marissa Peace, NP 5/2/20191:32 PM

## 2017-07-03 NOTE — Patient Instructions (Signed)
Implanted Port Insertion, Care After °This sheet gives you information about how to care for yourself after your procedure. Your health care provider may also give you more specific instructions. If you have problems or questions, contact your health care provider. °What can I expect after the procedure? °After your procedure, it is common to have: °· Discomfort at the port insertion site. °· Bruising on the skin over the port. This should improve over 3-4 days. ° °Follow these instructions at home: °Port care °· After your port is placed, you will get a manufacturer's information card. The card has information about your port. Keep this card with you at all times. °· Take care of the port as told by your health care provider. Ask your health care provider if you or a family member can get training for taking care of the port at home. A home health care nurse may also take care of the port. °· Make sure to remember what type of port you have. °Incision care °· Follow instructions from your health care provider about how to take care of your port insertion site. Make sure you: °? Wash your hands with soap and water before you change your bandage (dressing). If soap and water are not available, use hand sanitizer. °? Change your dressing as told by your health care provider. °? Leave stitches (sutures), skin glue, or adhesive strips in place. These skin closures may need to stay in place for 2 weeks or longer. If adhesive strip edges start to loosen and curl up, you may trim the loose edges. Do not remove adhesive strips completely unless your health care provider tells you to do that. °· Check your port insertion site every day for signs of infection. Check for: °? More redness, swelling, or pain. °? More fluid or blood. °? Warmth. °? Pus or a bad smell. °General instructions °· Do not take baths, swim, or use a hot tub until your health care provider approves. °· Do not lift anything that is heavier than 10 lb (4.5  kg) for a week, or as told by your health care provider. °· Ask your health care provider when it is okay to: °? Return to work or school. °? Resume usual physical activities or sports. °· Do not drive for 24 hours if you were given a medicine to help you relax (sedative). °· Take over-the-counter and prescription medicines only as told by your health care provider. °· Wear a medical alert bracelet in case of an emergency. This will tell any health care providers that you have a port. °· Keep all follow-up visits as told by your health care provider. This is important. °Contact a health care provider if: °· You cannot flush your port with saline as directed, or you cannot draw blood from the port. °· You have a fever or chills. °· You have more redness, swelling, or pain around your port insertion site. °· You have more fluid or blood coming from your port insertion site. °· Your port insertion site feels warm to the touch. °· You have pus or a bad smell coming from the port insertion site. °Get help right away if: °· You have chest pain or shortness of breath. °· You have bleeding from your port that you cannot control. °Summary °· Take care of the port as told by your health care provider. °· Change your dressing as told by your health care provider. °· Keep all follow-up visits as told by your health care provider. °  This information is not intended to replace advice given to you by your health care provider. Make sure you discuss any questions you have with your health care provider. °Document Released: 12/09/2012 Document Revised: 01/10/2016 Document Reviewed: 01/10/2016 °Elsevier Interactive Patient Education © 2017 Elsevier Inc. ° °

## 2017-07-04 LAB — IRON AND TIBC
IRON: 150 ug/dL — AB (ref 41–142)
Saturation Ratios: 49 % (ref 21–57)
TIBC: 308 ug/dL (ref 236–444)
UIBC: 158 ug/dL

## 2017-07-04 LAB — FERRITIN: Ferritin: 146 ng/mL (ref 9–269)

## 2017-07-17 ENCOUNTER — Other Ambulatory Visit: Payer: Self-pay | Admitting: Family Medicine

## 2017-07-24 ENCOUNTER — Other Ambulatory Visit: Payer: Self-pay | Admitting: Family Medicine

## 2017-07-30 ENCOUNTER — Encounter (HOSPITAL_COMMUNITY): Payer: Medicare Other

## 2017-07-31 ENCOUNTER — Encounter (HOSPITAL_COMMUNITY)
Admission: RE | Admit: 2017-07-31 | Discharge: 2017-07-31 | Disposition: A | Discharge status: Home / Self Care | Payer: Medicare Other | Source: Ambulatory Visit | Attending: Family | Admitting: Family

## 2017-07-31 DIAGNOSIS — C3490 Malignant neoplasm of unspecified part of unspecified bronchus or lung: Secondary | ICD-10-CM | POA: Diagnosis not present

## 2017-07-31 DIAGNOSIS — C3431 Malignant neoplasm of lower lobe, right bronchus or lung: Secondary | ICD-10-CM | POA: Insufficient documentation

## 2017-07-31 LAB — GLUCOSE, CAPILLARY: Glucose-Capillary: 79 mg/dL (ref 65–99)

## 2017-07-31 MED ORDER — FLUDEOXYGLUCOSE F - 18 (FDG) INJECTION
4.9000 | Freq: Once | INTRAVENOUS | Status: AC | PRN
  Administered 2017-07-31: 4.9 via INTRAVENOUS

## 2017-08-07 ENCOUNTER — Inpatient Hospital Stay: Payer: Medicare Other | Attending: Hematology & Oncology

## 2017-08-07 ENCOUNTER — Encounter: Payer: Self-pay | Admitting: Family

## 2017-08-07 ENCOUNTER — Other Ambulatory Visit: Payer: Self-pay

## 2017-08-07 ENCOUNTER — Inpatient Hospital Stay: Payer: Medicare Other

## 2017-08-07 ENCOUNTER — Inpatient Hospital Stay (HOSPITAL_BASED_OUTPATIENT_CLINIC_OR_DEPARTMENT_OTHER): Payer: Medicare Other | Admitting: Family

## 2017-08-07 VITALS — BP 163/85 | HR 104 | Temp 98.0°F | Resp 18 | Wt 95.0 lb

## 2017-08-07 DIAGNOSIS — D509 Iron deficiency anemia, unspecified: Secondary | ICD-10-CM | POA: Diagnosis not present

## 2017-08-07 DIAGNOSIS — C3431 Malignant neoplasm of lower lobe, right bronchus or lung: Secondary | ICD-10-CM

## 2017-08-07 DIAGNOSIS — R599 Enlarged lymph nodes, unspecified: Secondary | ICD-10-CM | POA: Diagnosis not present

## 2017-08-07 DIAGNOSIS — D500 Iron deficiency anemia secondary to blood loss (chronic): Secondary | ICD-10-CM

## 2017-08-07 LAB — CBC WITH DIFFERENTIAL (CANCER CENTER ONLY)
BASOS ABS: 0.1 10*3/uL (ref 0.0–0.1)
Basophils Relative: 1 %
EOS ABS: 0.3 10*3/uL (ref 0.0–0.5)
EOS PCT: 3 %
HCT: 36.4 % (ref 34.8–46.6)
HEMOGLOBIN: 11.6 g/dL (ref 11.6–15.9)
LYMPHS ABS: 2.5 10*3/uL (ref 0.9–3.3)
LYMPHS PCT: 20 %
MCH: 32.4 pg (ref 26.0–34.0)
MCHC: 31.9 g/dL — ABNORMAL LOW (ref 32.0–36.0)
MCV: 101.7 fL — ABNORMAL HIGH (ref 81.0–101.0)
Monocytes Absolute: 1.3 10*3/uL — ABNORMAL HIGH (ref 0.1–0.9)
Monocytes Relative: 11 %
NEUTROS PCT: 65 %
Neutro Abs: 8 10*3/uL — ABNORMAL HIGH (ref 1.5–6.5)
PLATELETS: 370 10*3/uL (ref 145–400)
RBC: 3.58 MIL/uL — ABNORMAL LOW (ref 3.70–5.32)
RDW: 14.8 % (ref 11.1–15.7)
WBC Count: 12.2 10*3/uL — ABNORMAL HIGH (ref 3.9–10.0)

## 2017-08-07 LAB — CMP (CANCER CENTER ONLY)
ALBUMIN: 3.3 g/dL — AB (ref 3.5–5.0)
ALK PHOS: 72 U/L (ref 26–84)
ALT: 26 U/L (ref 10–47)
AST: 24 U/L (ref 11–38)
Anion gap: 10 (ref 5–15)
BUN: 47 mg/dL — AB (ref 7–22)
CALCIUM: 9.3 mg/dL (ref 8.0–10.3)
CO2: 29 mmol/L (ref 18–33)
CREATININE: 1.9 mg/dL — AB (ref 0.60–1.20)
Chloride: 104 mmol/L (ref 98–108)
GLUCOSE: 107 mg/dL (ref 73–118)
POTASSIUM: 4.5 mmol/L (ref 3.3–4.7)
SODIUM: 143 mmol/L (ref 128–145)
TOTAL PROTEIN: 6.6 g/dL (ref 6.4–8.1)
Total Bilirubin: 0.4 mg/dL (ref 0.2–1.6)

## 2017-08-07 NOTE — Progress Notes (Signed)
Hematology and Oncology Follow Up Visit  Marissa Cruz [id number] [birth year] 83 y.o. 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 She had a PET scan last week which showed enlarging retroperitoneal lymph nodes suspicious for metastatic disease.  Her calcium is stable at 9.3.  Despite this she is doing well and has no complaints at this time.  She has some episodes of mild SOB at night when lying flat. Her dry cough is unchanged.  No fever, chills, n/v, rash, dizziness, chest pain, palpitations, abdominal pain or changes in bowel or bladder habits. No diarrhea.  No episodes of bleeding, no bruising or petechiae.  No swelling, tenderness, numbness or tingling in her extremities.  No lymphadenopathy noted on exam.  She still does not have much of an appetite but is trying to eat. She is staying hydrated and her weight is up 2 lbs since her last visit.   ECOG Performance Status: 2 - Symptomatic, <50% confined to bed  Medications:  Allergies as of 08/07/2017      Reactions   Augmentin [amoxicillin-pot Clavulanate] Diarrhea   c diff   Morphine And Related Other (See Comments)   Hallucinations and combative   Codeine Nausea Only      Medication List        Accurate as of 08/07/17 11:48 AM. Always use your most recent med list.          allopurinol 100 MG tablet Commonly known as:  ZYLOPRIM Take 1 tablet (100 mg total) by mouth at bedtime.   ALPRAZolam 0.25 MG tablet Commonly known as:  XANAX Take 1 tablet (0.25 mg total) by mouth 3 (three) times daily as needed for anxiety.     aspirin 81 MG tablet Take 81 mg by mouth daily.   azithromycin 250 MG tablet Commonly known as:  ZITHROMAX Z-PAK As directed   cetirizine 10 MG tablet Commonly known as:  ZYRTEC Take 10 mg by mouth at bedtime.   diphenoxylate-atropine 2.5-0.025 MG tablet Commonly known as:  LOMOTIL Take 1 tablet by mouth 4 (four) times daily as needed for diarrhea or loose stools.   HM MULTIVITAMIN ADULT GUMMY Chew Chew 2 tablets by mouth every morning.   lidocaine-prilocaine cream Commonly known as:  EMLA APPLY EXTERNALLY TO THE AFFECTED AREA 1 TIME   LORazepam 0.5 MG tablet Commonly known as:  ATIVAN Take 1 tablet (0.5 mg total) by mouth every 6 (six) hours as needed (Nausea or vomiting).   megestrol 40 MG/ML suspension Commonly known as:  MEGACE TAKE 10 ML BY MOUTH DAILY   metoprolol succinate 25 MG 24 hr tablet Commonly known as:  TOPROL-XL TAKE 1 TABLET BY MOUTH EVERY MORNING* NEEDS OFFICE VISIT BEFORE ANY MORE REFILLS   mirtazapine 15 MG disintegrating tablet Commonly known as:  REMERON SOL-TAB DISSOLVE 1 TABLET UNDER THE TONGUE DAILY AT BEDTIME   mirtazapine 15 MG disintegrating tablet Commonly known as:  REMERON SOL-TAB DISSOLVE 1 TABLET UNDER THE TONGUE DAILY AT BEDTIME   nicotine 7 mg/24hr patch Commonly known as:  NICODERM CQ - dosed in mg/24 hr Place 7 mg onto the skin daily.  omeprazole 20 MG capsule Commonly known as:  PRILOSEC Take 1 capsule (20 mg total) by mouth 2 (two) times daily before a meal.   ondansetron 8 MG disintegrating tablet Commonly known as:  ZOFRAN-ODT Take 1 tablet (8 mg total) by mouth every 8 (eight) hours as needed for nausea or vomiting. Reported on 05/17/2015   oxandrolone 10 MG tablet Commonly known as:  OXANDRIN Take 1 tablet (10 mg total) by mouth daily with breakfast.   PARoxetine 10 MG tablet Commonly known as:  PAXIL TAKE 1 TABLET BY MOUTH DAILY FOR DEPRESSION   predniSONE 10 MG tablet Commonly known as:  DELTASONE TAKE 3  TABLETS PO QD FOR 3 DAYS THEN TAKE 2 TABLETS PO QD FOR 3 DAYS THEN TAKE 1 TABLET PO QD FOR 3 DAYS THEN TAKE 1/2 TAB PO QD FOR 3 DAYS   [name] 0.625 MG tablet Generic drug:  estrogens (conjugated) TAKE 1 TABLET BY MOUTH EVERY DAY FOR 21 DAYS. DO NOT TAKE FOR 7 DAYS   traMADol 50 MG tablet Commonly known as:  ULTRAM Take 1 tablet (50 mg total) by mouth every 6 (six) hours as needed.   traZODone 50 MG tablet Commonly known as:  DESYREL Take 1 tablet (50 mg total) by mouth at bedtime. Needs follow up       Allergies:  Allergies  Allergen Reactions  . Augmentin [Amoxicillin-Pot Clavulanate] Diarrhea    c diff  . Morphine And Related Other (See Comments)    Hallucinations and combative  . Codeine Nausea Only    Past Medical History, Surgical history, Social history, and Family History were reviewed and updated.  Review of Systems: All other 10 point review of systems is negative.   Physical Exam:  vitals were not taken for this visit.   Wt Readings from Last 3 Encounters:  07/03/17 93 lb (42.2 kg)  06/26/17 91 lb 3.2 oz (41.4 kg)  06/05/17 91 lb 8 oz (41.5 kg)    Ocular: Sclerae unicteric, pupils equal, round and reactive to light Ear-nose-throat: Oropharynx clear, dentition fair Lymphatic: No cervical, supraclavicular or axillary adenopathy Lungs no rales or rhonchi, good excursion bilaterally Heart regular rate and rhythm, no murmur appreciated Abd soft, nontender, positive bowel sounds, no liver or spleen tip palpated on exam, no fluid wave  MSK no focal spinal tenderness, no joint edema Neuro: non-focal, well-oriented, appropriate affect Breasts: Deferred   Lab Results  Component Value Date   WBC 12.2 (H) 08/07/2017   HGB 11.6 08/07/2017   HCT 36.4 08/07/2017   MCV 101.7 (H) 08/07/2017   PLT 370 08/07/2017   Lab Results  Component Value Date   FERRITIN 146 07/03/2017   IRON 150 (H) 07/03/2017   TIBC 308 07/03/2017   UIBC 158 07/03/2017   IRONPCTSAT 49  07/03/2017   Lab Results  Component Value Date   RBC 3.58 (L) 08/07/2017   No results found for: KPAFRELGTCHN, LAMBDASER, KAPLAMBRATIO No results found for: IGGSERUM, IGA, IGMSERUM No results found for: Odetta Pink, SPEI   Chemistry      Component Value Date/Time   NA 144 07/03/2017 1159   NA 140 01/30/2017 1138   NA 136 03/07/2016 1055   K 5.1 (H) 07/03/2017 1159   K 4.2 01/30/2017 1138   K 4.1 03/07/2016 1055   CL 107 07/03/2017 1159   CL 102 01/30/2017 1138   CO2 32 07/03/2017 1159   CO2 28 01/30/2017 1138   CO2 26 03/07/2016 1055  BUN 71 (H) 07/03/2017 1159   BUN 34 (H) 01/30/2017 1138   BUN 28.7 (H) 03/07/2016 1055   CREATININE 1.70 (H) 07/03/2017 1159   CREATININE 2.7 (H) 01/30/2017 1138   CREATININE 1.3 (H) 03/07/2016 1055      Component Value Date/Time   CALCIUM 9.5 07/03/2017 1159   CALCIUM 9.3 01/30/2017 1138   CALCIUM 9.5 03/07/2016 1055   ALKPHOS 62 07/03/2017 1159   ALKPHOS 59 01/30/2017 1138   ALKPHOS 83 03/07/2016 1055   AST 26 07/03/2017 1159   AST 17 03/07/2016 1055   ALT 31 07/03/2017 1159   ALT 16 01/30/2017 1138   ALT 9 03/07/2016 1055   BILITOT 0.5 07/03/2017 1159   BILITOT 0.26 03/07/2016 1055      Impression and Plan: Ms. Lindell is a very pleasant 83 yo caucasian female with locally advanced, inoperable, stage IIIb squamous cell carcinoma of the right lung, high PD-L1 score.  Her PET scan last week showed enlarging retroperitoneal lymph nodes suspicious for metastatic disease. Dr. Marin Olp was able to step in as well and speak with the patient and her daughter about this. A biopsy would be quite difficult. We will plan to repeat her PET scan in another 2 months to reassess.  Her calcium remains stable and over all she seems to be doing well.  We will see her for follow-up a week after her scan.  They will contact our office with any questions or concerns. We can certainly see her sooner if  need be.   Laverna Peace, NP 6/6/201911:48 AM

## 2017-08-08 LAB — IRON AND TIBC
Iron: 116 ug/dL (ref 41–142)
Saturation Ratios: 37 % (ref 21–57)
TIBC: 316 ug/dL (ref 236–444)
UIBC: 200 ug/dL

## 2017-08-08 LAB — FERRITIN: Ferritin: 107 ng/mL (ref 9–269)

## 2017-08-13 ENCOUNTER — Other Ambulatory Visit: Payer: Self-pay | Admitting: Family Medicine

## 2017-08-14 ENCOUNTER — Other Ambulatory Visit: Payer: Self-pay

## 2017-08-14 MED ORDER — MIRTAZAPINE 15 MG PO TBDP: ORAL_TABLET | ORAL | 0 refills | Status: DC

## 2017-08-15 ENCOUNTER — Other Ambulatory Visit: Payer: Medicare Other

## 2017-08-15 ENCOUNTER — Ambulatory Visit: Payer: Medicare Other | Admitting: Family

## 2017-08-19 DIAGNOSIS — H43813 Vitreous degeneration, bilateral: Secondary | ICD-10-CM | POA: Diagnosis not present

## 2017-08-19 DIAGNOSIS — H353232 Exudative age-related macular degeneration, bilateral, with inactive choroidal neovascularization: Secondary | ICD-10-CM | POA: Diagnosis not present

## 2017-09-05 ENCOUNTER — Other Ambulatory Visit: Payer: Self-pay | Admitting: Family Medicine

## 2017-09-09 ENCOUNTER — Other Ambulatory Visit: Payer: Self-pay | Admitting: Family Medicine

## 2017-09-20 ENCOUNTER — Other Ambulatory Visit: Payer: Self-pay | Admitting: Hematology & Oncology

## 2017-09-20 DIAGNOSIS — C3431 Malignant neoplasm of lower lobe, right bronchus or lung: Secondary | ICD-10-CM

## 2017-10-02 ENCOUNTER — Ambulatory Visit (HOSPITAL_COMMUNITY)
Admission: RE | Admit: 2017-10-02 | Discharge: 2017-10-02 | Disposition: A | Discharge status: Home / Self Care | Payer: Medicare Other | Source: Ambulatory Visit | Attending: Family | Admitting: Family

## 2017-10-02 DIAGNOSIS — C3431 Malignant neoplasm of lower lobe, right bronchus or lung: Secondary | ICD-10-CM | POA: Diagnosis not present

## 2017-10-02 DIAGNOSIS — C3490 Malignant neoplasm of unspecified part of unspecified bronchus or lung: Secondary | ICD-10-CM | POA: Diagnosis not present

## 2017-10-02 DIAGNOSIS — R590 Localized enlarged lymph nodes: Secondary | ICD-10-CM | POA: Diagnosis not present

## 2017-10-02 LAB — GLUCOSE, CAPILLARY: Glucose-Capillary: 67 mg/dL — ABNORMAL LOW (ref 70–99)

## 2017-10-02 MED ORDER — FLUDEOXYGLUCOSE F - 18 (FDG) INJECTION
5.0000 | Freq: Once | INTRAVENOUS | Status: AC | PRN
  Administered 2017-10-02: 5 via INTRAVENOUS

## 2017-10-09 ENCOUNTER — Encounter: Payer: Self-pay | Admitting: Hematology & Oncology

## 2017-10-09 ENCOUNTER — Telehealth: Payer: Self-pay | Admitting: Pharmacist

## 2017-10-09 ENCOUNTER — Telehealth: Payer: Self-pay | Admitting: Pharmacy Technician

## 2017-10-09 ENCOUNTER — Other Ambulatory Visit: Payer: Self-pay | Admitting: *Deleted

## 2017-10-09 ENCOUNTER — Inpatient Hospital Stay: Payer: Medicare Other | Attending: Hematology & Oncology | Admitting: Hematology & Oncology

## 2017-10-09 ENCOUNTER — Other Ambulatory Visit: Payer: Self-pay | Admitting: Family Medicine

## 2017-10-09 ENCOUNTER — Other Ambulatory Visit: Payer: Self-pay

## 2017-10-09 ENCOUNTER — Inpatient Hospital Stay: Payer: Medicare Other

## 2017-10-09 VITALS — BP 177/66 | HR 95 | Temp 97.5°F | Resp 18 | Wt 101.0 lb

## 2017-10-09 DIAGNOSIS — C3431 Malignant neoplasm of lower lobe, right bronchus or lung: Secondary | ICD-10-CM

## 2017-10-09 DIAGNOSIS — Z9181 History of falling: Secondary | ICD-10-CM | POA: Diagnosis not present

## 2017-10-09 DIAGNOSIS — D500 Iron deficiency anemia secondary to blood loss (chronic): Secondary | ICD-10-CM

## 2017-10-09 LAB — CMP (CANCER CENTER ONLY)
ALT: 17 U/L (ref 10–47)
ANION GAP: 0 — AB (ref 5–15)
AST: 23 U/L (ref 11–38)
Albumin: 3.4 g/dL — ABNORMAL LOW (ref 3.5–5.0)
Alkaline Phosphatase: 65 U/L (ref 26–84)
BILIRUBIN TOTAL: 0.4 mg/dL (ref 0.2–1.6)
BUN: 57 mg/dL — ABNORMAL HIGH (ref 7–22)
CHLORIDE: 109 mmol/L — AB (ref 98–108)
CO2: 31 mmol/L (ref 18–33)
Calcium: 9.4 mg/dL (ref 8.0–10.3)
Creatinine: 1.8 mg/dL — ABNORMAL HIGH (ref 0.60–1.20)
GLUCOSE: 127 mg/dL — AB (ref 73–118)
Potassium: 5.1 mmol/L — ABNORMAL HIGH (ref 3.3–4.7)
Sodium: 135 mmol/L (ref 128–145)
Total Protein: 6.6 g/dL (ref 6.4–8.1)

## 2017-10-09 LAB — CBC WITH DIFFERENTIAL (CANCER CENTER ONLY)
Basophils Absolute: 0.1 10*3/uL (ref 0.0–0.1)
Basophils Relative: 1 %
EOS PCT: 2 %
Eosinophils Absolute: 0.3 10*3/uL (ref 0.0–0.5)
HEMATOCRIT: 37.8 % (ref 34.8–46.6)
Hemoglobin: 12 g/dL (ref 11.6–15.9)
Lymphocytes Relative: 19 %
Lymphs Abs: 2.8 10*3/uL (ref 0.9–3.3)
MCH: 32.3 pg (ref 26.0–34.0)
MCHC: 31.7 g/dL — AB (ref 32.0–36.0)
MCV: 101.9 fL — AB (ref 81.0–101.0)
MONO ABS: 1.3 10*3/uL — AB (ref 0.1–0.9)
MONOS PCT: 9 %
Neutro Abs: 10 10*3/uL — ABNORMAL HIGH (ref 1.5–6.5)
Neutrophils Relative %: 69 %
PLATELETS: 361 10*3/uL (ref 145–400)
RBC: 3.71 MIL/uL (ref 3.70–5.32)
RDW: 14 % (ref 11.1–15.7)
WBC Count: 14.4 10*3/uL — ABNORMAL HIGH (ref 3.9–10.0)

## 2017-10-09 MED ORDER — AFATINIB DIMALEATE 30 MG PO TABS: 30.0000 mg | ORAL_TABLET | Freq: Every day | ORAL | 4 refills | Status: DC

## 2017-10-09 NOTE — Patient Instructions (Signed)
Implanted Port Home Guide An implanted port is a type of central line that is placed under the skin. Central lines are used to provide IV access when treatment or nutrition needs to be given through a person's veins. Implanted ports are used for long-term IV access. An implanted port may be placed because:  You need IV medicine that would be irritating to the small veins in your hands or arms.  You need long-term IV medicines, such as antibiotics.  You need IV nutrition for a long period.  You need frequent blood draws for lab tests.  You need dialysis.  Implanted ports are usually placed in the chest area, but they can also be placed in the upper arm, the abdomen, or the leg. An implanted port has two main parts:  Reservoir. The reservoir is round and will appear as a small, raised area under your skin. The reservoir is the part where a needle is inserted to give medicines or draw blood.  Catheter. The catheter is a thin, flexible tube that extends from the reservoir. The catheter is placed into a large vein. Medicine that is inserted into the reservoir goes into the catheter and then into the vein.  How will I care for my incision site? Do not get the incision site wet. Bathe or shower as directed by your health care provider. How is my port accessed? Special steps must be taken to access the port:  Before the port is accessed, a numbing cream can be placed on the skin. This helps numb the skin over the port site.  Your health care provider uses a sterile technique to access the port. ? Your health care provider must put on a mask and sterile gloves. ? The skin over your port is cleaned carefully with an antiseptic and allowed to dry. ? The port is gently pinched between sterile gloves, and a needle is inserted into the port.  Only "non-coring" port needles should be used to access the port. Once the port is accessed, a blood return should be checked. This helps ensure that the port  is in the vein and is not clogged.  If your port needs to remain accessed for a constant infusion, a clear (transparent) bandage will be placed over the needle site. The bandage and needle will need to be changed every week, or as directed by your health care provider.  Keep the bandage covering the needle clean and dry. Do not get it wet. Follow your health care provider's instructions on how to take a shower or bath while the port is accessed.  If your port does not need to stay accessed, no bandage is needed over the port.  What is flushing? Flushing helps keep the port from getting clogged. Follow your health care provider's instructions on how and when to flush the port. Ports are usually flushed with saline solution or a medicine called heparin. The need for flushing will depend on how the port is used.  If the port is used for intermittent medicines or blood draws, the port will need to be flushed: ? After medicines have been given. ? After blood has been drawn. ? As part of routine maintenance.  If a constant infusion is running, the port may not need to be flushed.  How long will my port stay implanted? The port can stay in for as long as your health care provider thinks it is needed. When it is time for the port to come out, surgery will be   done to remove it. The procedure is similar to the one performed when the port was put in. When should I seek immediate medical care? When you have an implanted port, you should seek immediate medical care if:  You notice a bad smell coming from the incision site.  You have swelling, redness, or drainage at the incision site.  You have more swelling or pain at the port site or the surrounding area.  You have a fever that is not controlled with medicine.  This information is not intended to replace advice given to you by your health care provider. Make sure you discuss any questions you have with your health care provider. Document  Released: 02/18/2005 Document Revised: 07/27/2015 Document Reviewed: 10/26/2012 [employer] Interactive Patient Education  2017 [employer]  

## 2017-10-09 NOTE — Telephone Encounter (Signed)
Oral Oncology Patient Advocate Encounter  Received notification from [facility] that prior authorization for Gilotrif is required.  PA submitted on CoverMyMeds Key [id number] Status is pending  Oral Oncology Clinic will continue to follow.  Marissa Cruz Phone [phone] Fax [phone] 10/09/2017 3:38 PM

## 2017-10-09 NOTE — Progress Notes (Signed)
Hematology and Oncology Follow Up Visit  Marissa Cruz [id number] 12-05-1934 83 y.o. 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 Wisnewski is here today with her daughter for follow-up. She had a PET scan last week which showed further enlargement of the  retroperitoneal lymph nodes.  The SUV also was increasing.  I suspect that this clearly indicates that the Beryle Flock is no longer working.  She actually has been off treatment for quite a while.  I think the option at this point, because she has squamous cell cancer, is to try afatinib.  This is approved for second line therapy in squamous cell carcinoma of the lung.  I talked to she and her daughter about afatinib.  I think that this would be something that is reasonable.  It is oral.  I think it has a very good toxicity profile.  I would not use full dose of afatinib.  I would consider reduced dose of afatinib of 30 mg daily.  Ms. Milham is gaining weight.  She is eating a little bit better.  She is having no pain.  There is no nausea or vomiting.  She is not having any diarrhea.  Overall, I will see her performance status is ECOG 2.    Medications:  Allergies as of 10/09/2017      Reactions   Augmentin [amoxicillin-pot Clavulanate] Diarrhea   c diff   Morphine And Related Other (See Comments)   Hallucinations and combative   Codeine Nausea Only   Morphine    delusions      Medication List        Accurate as of 10/09/17  1:45 PM. Always use your most recent med list.          afatinib dimaleate 30 MG tablet Commonly known as:  GILOTRIF Take 1  tablet (30 mg total) by mouth daily. Take on an empty stomach 1hr before or 2hrs after meals.   allopurinol 100 MG tablet Commonly known as:  ZYLOPRIM TAKE 1 TABLET(100 MG) BY MOUTH AT BEDTIME   ALPRAZolam 0.25 MG tablet Commonly known as:  XANAX Take 1 tablet (0.25 mg total) by mouth 3 (three) times daily as needed for anxiety.   aspirin 81 MG tablet Take 81 mg by mouth daily.   cetirizine 10 MG tablet Commonly known as:  ZYRTEC Take 10 mg by mouth at bedtime.   diphenoxylate-atropine 2.5-0.025 MG tablet Commonly known as:  LOMOTIL Take 1 tablet by mouth 4 (four) times daily as needed for diarrhea or loose stools.   HM MULTIVITAMIN ADULT GUMMY Chew Chew 2 tablets by mouth every morning.   lidocaine-prilocaine cream Commonly known as:  EMLA APPLY EXTERNALLY TO THE AFFECTED AREA 1 TIME   LORazepam 0.5 MG tablet Commonly known as:  ATIVAN Take 1 tablet (0.5 mg total) by mouth every 6 (six) hours as needed (Nausea or vomiting).   megestrol 40 MG/ML suspension Commonly known as:  MEGACE TAKE 10 ML BY MOUTH DAILY   metoprolol succinate 25 MG 24 hr tablet Commonly known as:  TOPROL-XL TAKE 1 TABLET BY MOUTH EVERY MORNING* NEEDS OFFICE VISIT BEFORE  ANY MORE REFILLS   mirtazapine 15 MG disintegrating tablet Commonly known as:  REMERON SOL-TAB DISSOLVE 1 TABLET UNDER THE TONGUE DAILY AT BEDTIME   nicotine 7 mg/24hr patch Commonly known as:  NICODERM CQ - dosed in mg/24 hr Place 7 mg onto the skin daily.   omeprazole 20 MG capsule Commonly known as:  PRILOSEC TAKE 1 CAPSULE(20 MG) BY MOUTH TWICE DAILY BEFORE A MEAL   ondansetron 8 MG disintegrating tablet Commonly known as:  ZOFRAN-ODT Take 1 tablet (8 mg total) by mouth every 8 (eight) hours as needed for nausea or vomiting. Reported on 05/17/2015   oxandrolone 10 MG tablet Commonly known as:  OXANDRIN Take 1 tablet (10 mg total) by mouth daily with breakfast.   PARoxetine 10 MG tablet Commonly known as:   PAXIL TAKE 1 TABLET BY MOUTH DAILY FOR DEPRESSION   predniSONE 10 MG tablet Commonly known as:  DELTASONE TAKE 3 TABLETS PO QD FOR 3 DAYS THEN TAKE 2 TABLETS PO QD FOR 3 DAYS THEN TAKE 1 TABLET PO QD FOR 3 DAYS THEN TAKE 1/2 TAB PO QD FOR 3 DAYS   [name] 0.625 MG tablet Generic drug:  estrogens (conjugated) TAKE 1 TABLET BY MOUTH EVERY DAY FOR 21 DAYS. DO NOT TAKE FOR 7 DAYS   traMADol 50 MG tablet Commonly known as:  ULTRAM Take 1 tablet (50 mg total) by mouth every 6 (six) hours as needed.   traZODone 50 MG tablet Commonly known as:  DESYREL Take 1 tablet (50 mg total) by mouth at bedtime. Needs follow up       Allergies:  Allergies  Allergen Reactions  . Augmentin [Amoxicillin-Pot Clavulanate] Diarrhea    c diff  . Morphine And Related Other (See Comments)    Hallucinations and combative  . Codeine Nausea Only  . Morphine     delusions    Past Medical History, Surgical history, Social history, and Family History were reviewed and updated.  Review of Systems:  Review of Systems  Constitutional: Negative.   HENT: Negative.   Eyes: Negative.   Respiratory: Negative.   Cardiovascular: Negative.   Gastrointestinal: Negative.   Genitourinary: Negative.   Musculoskeletal: Negative.   Skin: Negative.   Neurological: Negative.   Endo/Heme/Allergies: Negative.   Psychiatric/Behavioral: Negative.       Physical Exam:  weight is 101 lb (45.8 kg). Her oral temperature is 97.5 F (36.4 C) (abnormal). Her blood pressure is 177/66 (abnormal) and her pulse is 95. Her respiration is 18 and oxygen saturation is 98%.   Wt Readings from Last 3 Encounters:  10/09/17 101 lb (45.8 kg)  08/07/17 95 lb (43.1 kg)  07/03/17 93 lb (42.2 kg)    Physical Exam  Constitutional: She is oriented to person, place, and time.  HENT:  Head: Normocephalic and atraumatic.  Mouth/Throat: Oropharynx is clear and moist.  Eyes: Pupils are equal, round, and reactive to light. EOM are  normal.  Neck: Normal range of motion.  Cardiovascular: Normal rate, regular rhythm and normal heart sounds.  Pulmonary/Chest: Effort normal and breath sounds normal.  Abdominal: Soft. Bowel sounds are normal.  Musculoskeletal: Normal range of motion. She exhibits no edema, tenderness or deformity.  Lymphadenopathy:    She has no cervical adenopathy.  Neurological: She is alert and oriented to person, place, and time.  Skin: Skin is warm and dry. No rash noted. No erythema.  Psychiatric: She has a normal mood and affect. Her behavior is normal. Judgment and thought content normal.  Vitals reviewed.    Lab Results  Component Value Date   WBC 14.4 (H) 10/09/2017   HGB 12.0 10/09/2017   HCT 37.8 10/09/2017   MCV 101.9 (H) 10/09/2017   PLT 361 10/09/2017   Lab Results  Component Value Date   FERRITIN 107 08/07/2017   IRON 116 08/07/2017   TIBC 316 08/07/2017   UIBC 200 08/07/2017   IRONPCTSAT 37 08/07/2017   Lab Results  Component Value Date   RBC 3.71 10/09/2017   No results found for: KPAFRELGTCHN, LAMBDASER, KAPLAMBRATIO No results found for: IGGSERUM, IGA, IGMSERUM No results found for: Odetta Pink, SPEI   Chemistry      Component Value Date/Time   NA 135 10/09/2017 1155   NA 140 01/30/2017 1138   NA 136 03/07/2016 1055   K 5.1 (H) 10/09/2017 1155   K 4.2 01/30/2017 1138   K 4.1 03/07/2016 1055   CL 109 (H) 10/09/2017 1155   CL 102 01/30/2017 1138   CO2 31 10/09/2017 1155   CO2 28 01/30/2017 1138   CO2 26 03/07/2016 1055   BUN 57 (H) 10/09/2017 1155   BUN 34 (H) 01/30/2017 1138   BUN 28.7 (H) 03/07/2016 1055   CREATININE 1.80 (H) 10/09/2017 1155   CREATININE 2.7 (H) 01/30/2017 1138   CREATININE 1.3 (H) 03/07/2016 1055      Component Value Date/Time   CALCIUM 9.4 10/09/2017 1155   CALCIUM 9.3 01/30/2017 1138   CALCIUM 9.5 03/07/2016 1055   ALKPHOS 65 10/09/2017 1155   ALKPHOS 59 01/30/2017 1138    ALKPHOS 83 03/07/2016 1055   AST 23 10/09/2017 1155   AST 17 03/07/2016 1055   ALT 17 10/09/2017 1155   ALT 16 01/30/2017 1138   ALT 9 03/07/2016 1055   BILITOT 0.4 10/09/2017 1155   BILITOT 0.26 03/07/2016 1055      Impression and Plan: Ms. Tiedeman is a very pleasant 83 yo caucasian female with locally advanced, inoperable, stage IIIb squamous cell carcinoma of the right lung.  She had done very well with immunotherapy with Keytruda.  She is not a candidate for chemotherapy.  Again, afatinib is a good idea.  I do not see a problem with trying her on this.  I think that the response rate probably is about 30-35%.  She and her daughter both know that this is not anything that is curable and and that afatinib, if it works, is not going to get rid of this.  We will go ahead and try her on this.  We probably need to give it a good 2-3 months before we repeat another scan.  I spent a good 45 minutes with she and her daughter.  All the time was spent face-to-face.  I counseled them about the afatinib.  I went over the scans that she had done.  I answered all their questions.  I helped coordinate future care.  I will see her back in 3 weeks.  Volanda Napoleon, MD 8/8/20191:45 PM

## 2017-10-09 NOTE — Telephone Encounter (Signed)
Oral Oncology Pharmacist Encounter  Received new prescription for Gilotrif (afatinib) for the treatment of unresectable non-small cell squamous cell carcimona, planned duration until disease progression or unacceptable drug toxicity.  CMP from 10/09/17 assessed, her SCr is elevated. Based on her SCr her CrCl is estimated to for 75mL/min. For severe real impairment (CrCl 15-107mL/min) the recommendation is to dose decrease to 30mg  daily. This is the dose that has been prescribed.   Current medication list in Epic reviewed, no DDIs with Gilotrif (afatinib) identified.  Prescription has been e-scribed to the [facility] for benefits analysis and approval.  Oral Oncology Clinic will continue to follow for insurance authorization, copayment issues, initial counseling and start date.  Darl Pikes, PharmD, BCPS, [facility] Hematology/Oncology Clinical Pharmacist [facility] [phone]  10/09/2017 3:07 PM

## 2017-10-10 LAB — IRON AND TIBC
IRON: 102 ug/dL (ref 41–142)
SATURATION RATIOS: 31 % (ref 21–57)
TIBC: 333 ug/dL (ref 236–444)
UIBC: 230 ug/dL

## 2017-10-10 LAB — FERRITIN: Ferritin: 64 ng/mL (ref 11–307)

## 2017-10-10 NOTE — Telephone Encounter (Signed)
Oral Oncology Patient Advocate Encounter  Received notification from [employer] that the request for prior authorization for Gilotrif has been denied because the criteria for medical necessity was not met.   The insurance company will mail a letter to patient explaining her rights to reconsideration.    In order for her plan to reconsider its denial they have to receive a written request from the member.  Alyson Leonard, Oral Oncology Pharmacist, spoke with Stacey, the patients daughter, and informed her to be expecting a letter from the insurance company.  Once the letter is received, Stacey will call us and we will start the appeal process.  This encounter will continue to be updated until final determination.    Judy Mcadams CPHT Specialty Pharmacy Patient Advocate [facility] Phone [phone] Fax [phone] 10/10/2017 9:44 AM   

## 2017-10-13 ENCOUNTER — Encounter: Payer: Self-pay | Admitting: Hematology & Oncology

## 2017-10-14 NOTE — Telephone Encounter (Signed)
I have spoken with Marissa Cruz, she is going to have her mom sign the form required to allow [place] to appeal on her behalf and email/fax that to me along with the letter they received in the mail.  After I receive that information, I will send Dr. Marin Olp an appeal letter to sign to send to [facility] for appeal consideration.   Darl Pikes, PharmD, BCPS, [facility] Hematology/Oncology Clinical Pharmacist [facility] [phone]  10/14/2017 2:14 PM

## 2017-10-15 NOTE — Telephone Encounter (Signed)
Oral Oncology Patient Advocate Encounter  I have filed an urgent appeal for the prior authorization denial of Gilotrif.   Appeal packet was faxed on 10/15/17 to [phone].   The insurance company states that a 40 hour turn around time is to be expected for urgent redeterminations of denial.   This encounter will continue to be updated until final appeal determination.    [place] Patient [place] Phone [phone] Fax [phone] 10/15/2017 10:03 AM

## 2017-10-20 ENCOUNTER — Telehealth: Payer: Self-pay | Admitting: Pharmacist

## 2017-10-20 ENCOUNTER — Other Ambulatory Visit: Payer: Self-pay | Admitting: Family Medicine

## 2017-10-20 NOTE — Telephone Encounter (Signed)
Oral Oncology Patient Advocate Encounter  Received notification from [facility] that the appeal has been approved.  Prior Authorization for Marissa Cruz has been approved.    PA# [id number] Effective dates: 09/17/2017 through 10/17/2018  Oral Oncology Clinic will continue to follow.   [place] Patient [place] Phone [phone] Fax [phone] 10/20/2017 8:55 AM

## 2017-10-20 NOTE — Telephone Encounter (Signed)
Metoprolol and Paxil refills sent to pharmacy. Pt is due for follow up with Dr Carollee Herter. Please call pt to schedule appt soon. Thanks!

## 2017-10-21 MED FILL — GILOTRIF 30 MG TABLET: 30 | 30 days supply | Qty: 30 | Fill #0

## 2017-10-21 NOTE — Telephone Encounter (Signed)
Oral Chemotherapy Pharmacist Encounter  The appeal for Gilotrif was approved.  Patient Education I spoke with patient's daughter Erline Levine for overview of new oral chemotherapy medication: Gilotrif (afatinib) for the treatment of unresectable non-small cell squamous cell carcimona, planned duration until disease progression or unacceptable drug toxicity.   Counseled Stacey on administration, dosing, side effects, monitoring, drug-food interactions, safe handling, storage, and disposal. Patient will take 1 tablet (30 mg total) by mouth daily. Take on an empty stomach 1hr before or 2hrs after meals.  Side effects include but not limited to: diarrhea, rash, acne-like rash, changes in renal/hepatic function, nail changes, mouth sores, low potassium .    Reviewed with Erline Levine the importance of keeping a medication schedule and plan for any missed doses.  Ms. Ciaramitaro voiced understanding and appreciation. All questions answered. Medication handout placed in the mail.   Provided patient with [facility] phone number. Patient knows to call the office with questions or concerns. Oral [facility] will continue to follow.  Darl Pikes, PharmD, BCPS, BCOP Hematology/Oncology Clinical Pharmacist [facility] [phone]  10/21/2017 10:20 AM

## 2017-10-21 NOTE — Telephone Encounter (Signed)
Pt scheduled 11/06/17 @ 1:45pm with Dr. Carollee Herter

## 2017-10-22 ENCOUNTER — Telehealth: Payer: Self-pay | Admitting: Pharmacy Technician

## 2017-10-22 NOTE — Telephone Encounter (Signed)
Oral Oncology Patient Advocate Encounter   Was successful in obtaining a copay card for Gilotrif.  This copay card will make the patients copay $0.00.  The billing information is as follows and has been shared with [facility].   RxBin: [id number] Member ID: [id number] Group ID: [id number] Expiration date 03/03/18  [place] [place] Phone [phone] Fax [phone] 10/22/2017 8:54 AM

## 2017-10-28 ENCOUNTER — Encounter (HOSPITAL_BASED_OUTPATIENT_CLINIC_OR_DEPARTMENT_OTHER): Payer: Self-pay | Admitting: Emergency Medicine

## 2017-10-28 ENCOUNTER — Emergency Department (HOSPITAL_BASED_OUTPATIENT_CLINIC_OR_DEPARTMENT_OTHER): Payer: Medicare Other

## 2017-10-28 ENCOUNTER — Emergency Department (HOSPITAL_BASED_OUTPATIENT_CLINIC_OR_DEPARTMENT_OTHER)
Admission: EM | Admit: 2017-10-28 | Discharge: 2017-10-28 | Disposition: A | Discharge status: Home / Self Care | Payer: Medicare Other | Attending: Emergency Medicine | Admitting: Emergency Medicine

## 2017-10-28 ENCOUNTER — Other Ambulatory Visit: Payer: Self-pay

## 2017-10-28 DIAGNOSIS — R519 Headache, unspecified: Secondary | ICD-10-CM

## 2017-10-28 DIAGNOSIS — N179 Acute kidney failure, unspecified: Secondary | ICD-10-CM | POA: Diagnosis not present

## 2017-10-28 DIAGNOSIS — Z87891 Personal history of nicotine dependence: Secondary | ICD-10-CM | POA: Insufficient documentation

## 2017-10-28 DIAGNOSIS — I10 Essential (primary) hypertension: Secondary | ICD-10-CM | POA: Insufficient documentation

## 2017-10-28 DIAGNOSIS — Z79899 Other long term (current) drug therapy: Secondary | ICD-10-CM | POA: Diagnosis not present

## 2017-10-28 DIAGNOSIS — R51 Headache: Secondary | ICD-10-CM | POA: Insufficient documentation

## 2017-10-28 LAB — URINALYSIS, ROUTINE W REFLEX MICROSCOPIC
BILIRUBIN URINE: NEGATIVE
Glucose, UA: NEGATIVE mg/dL
Hgb urine dipstick: NEGATIVE
KETONES UR: NEGATIVE mg/dL
NITRITE: NEGATIVE
PROTEIN: 100 mg/dL — AB
Specific Gravity, Urine: 1.01 (ref 1.005–1.030)
pH: 5.5 (ref 5.0–8.0)

## 2017-10-28 LAB — URINALYSIS, MICROSCOPIC (REFLEX)

## 2017-10-28 LAB — CBC WITH DIFFERENTIAL/PLATELET
BASOS ABS: 0.1 10*3/uL (ref 0.0–0.1)
BASOS PCT: 0 %
EOS ABS: 0.5 10*3/uL (ref 0.0–0.7)
Eosinophils Relative: 3 %
HCT: 38 % (ref 36.0–46.0)
Hemoglobin: 12.3 g/dL (ref 12.0–15.0)
Lymphocytes Relative: 13 %
Lymphs Abs: 1.9 10*3/uL (ref 0.7–4.0)
MCH: 32.3 pg (ref 26.0–34.0)
MCHC: 32.4 g/dL (ref 30.0–36.0)
MCV: 99.7 fL (ref 78.0–100.0)
Monocytes Absolute: 1.5 10*3/uL — ABNORMAL HIGH (ref 0.1–1.0)
Monocytes Relative: 10 %
NEUTROS PCT: 74 %
Neutro Abs: 10.8 10*3/uL (ref 1.7–7.7)
PLATELETS: 336 10*3/uL (ref 150–400)
RBC: 3.81 MIL/uL — AB (ref 3.87–5.11)
RDW: 14.1 % (ref 11.5–15.5)
WBC: 14.7 10*3/uL — AB (ref 4.0–10.5)

## 2017-10-28 LAB — BASIC METABOLIC PANEL
ANION GAP: 11 (ref 5–15)
BUN: 76 mg/dL — ABNORMAL HIGH (ref 8–23)
CALCIUM: 8.7 mg/dL — AB (ref 8.9–10.3)
CO2: 27 mmol/L (ref 22–32)
CREATININE: 2.2 mg/dL — AB (ref 0.44–1.00)
Chloride: 99 mmol/L (ref 98–111)
GFR, EST AFRICAN AMERICAN: 23 mL/min — AB (ref >60)
GFR, EST NON AFRICAN AMERICAN: 20 mL/min — AB (ref >60)
Glucose, Bld: 107 mg/dL — ABNORMAL HIGH (ref 70–99)
Potassium: 5 mmol/L (ref 3.5–5.1)
Sodium: 137 mmol/L (ref 135–145)

## 2017-10-28 MED ORDER — METOCLOPRAMIDE HCL 5 MG/ML IJ SOLN
5.0000 mg | Freq: Once | INTRAMUSCULAR | Status: AC
  Administered 2017-10-28: 5 mg via INTRAVENOUS
  Filled 2017-10-28: qty 2

## 2017-10-28 MED ORDER — SODIUM CHLORIDE 0.9 % IV BOLUS
500.0000 mL | Freq: Once | INTRAVENOUS | Status: AC
  Administered 2017-10-28: 500 mL via INTRAVENOUS

## 2017-10-28 MED ORDER — METOCLOPRAMIDE HCL 5 MG PO TABS: 5.0000 mg | ORAL_TABLET | Freq: Four times a day (QID) | ORAL | 0 refills | Status: DC | PRN

## 2017-10-28 NOTE — ED Notes (Signed)
Patient transported to CT 

## 2017-10-28 NOTE — ED Notes (Signed)
Pt and family member refuses to have pharmacy preference to be changed to a 24 hour open pharmacy near by.

## 2017-10-28 NOTE — ED Notes (Signed)
ED Provider at bedside. 

## 2017-10-28 NOTE — ED Triage Notes (Signed)
Pt states she has 7/10 HA and neck pain, states she took some Aleve 1.5 with some relief, but still have the HA.

## 2017-10-28 NOTE — ED Provider Notes (Signed)
[facility] DEPT [facility] Provider Note: Georgena Spurling, MD, FACEP  CSN: [id number] MRN: [id number] ARRIVAL: 10/28/17 at [place]: [place]  Headache   HISTORY OF PRESENT ILLNESS  10/28/17 3:54 AM Marissa Cruz is a 83 y.o. female with metastatic lung cancer who was started on Gilotrif four days ago.  She has been having intermittent headaches for the past 2 months which have not been severe.  She is here with a more severe headache that began about 9 PM yesterday evening.  The onset was gradual.  She rates her pain as a 7 out of 10.  The pain is located on the left side of her head into her neck.  It is not significantly changed with movement of the neck.  She denies associated neurologic changes, visual changes, nausea or vomiting.  She does have generalized weakness, diarrhea and oral irritation which she attributes to the Gilotrif.  She took Aleve earlier with difficult but not complete relief of the headache.   Past Medical History:  Diagnosis Date  . Age-related macular degeneration, dry, right eye   . Age-related macular degeneration, wet, left eye ([place])   . Anemia   . Anxiety   . Arthritis    "thumbs, hands" (08/18/2014)  . CAP (community acquired pneumonia) 08/19/2014  . Carotid artery disease ([place])   . Chicken pox   . Chronic lower back pain   . Colon polyp   . Depression   . Diverticulitis   . GERD (gastroesophageal reflux disease)   . Goals of care, counseling/discussion 03/07/2016  . History of blood transfusion 1976; 2013   "w/hysterectomy; hip OR"  . History of gout   . Hypertension   . Iron deficiency anemia due to chronic blood loss 06/06/2016  . 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 Kidney disease   . 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 TIA (transient ischemic attack) ~ 2009 X 2  . Vocal cord cancer ([place]) ~ 2009   S/P radiation    Past Surgical History:  Procedure Laterality Date  . ABDOMINAL HYSTERECTOMY  1976    . APPENDECTOMY  1954  . CAROTID ENDARTERECTOMY Left ~ 2014  . CATARACT EXTRACTION W/ INTRAOCULAR LENS  IMPLANT, BILATERAL Bilateral ~ 2009  . COLONOSCOPY N/A 01/31/2014   Procedure: COLONOSCOPY;  Surgeon: Ladene Artist, MD;  Location: [facility];  Service: Endoscopy;  Laterality: N/A;  . COLONOSCOPY N/A 07/15/2016   Procedure: COLONOSCOPY;  Surgeon: Milus Banister, MD;  Location: [facility] ENDOS[facility];  Service: Endoscopy;  Laterality: N/A;  . DILATION AND CURETTAGE OF UTERUS    . IR GENERIC HISTORICAL  03/12/2016   IR US GUIDE VASC ACCESS RIGHT 03/12/2016 Jacqulynn Cadet, MD [facility]  . IR GENERIC HISTORICAL  03/12/2016   IR FLUORO GUIDE PORT INSERTION RIGHT 03/12/2016 Jacqulynn Cadet, MD [facility]  . JOINT REPLACEMENT    . MOUTH FLOOR MASS EXCISION  03/2014   "removed cancer underneath my tongue"  . TONSILLECTOMY  1941  . TOTAL HIP ARTHROPLASTY Left 2013    Family History  Problem Relation Age of Onset  . Hypertension Father   . Skin cancer Father   . Emphysema Paternal Grandfather   . Liver cancer Mother   . Stomach cancer Maternal Grandfather     Social History   Tobacco Use  . Smoking status: Light Tobacco Smoker    Packs/day: 1.00    Years: 60.00    Pack years: 60.00  Types: Cigarettes    Last attempt to quit: 05/15/2016    Years since quitting: 1.4  . Smokeless tobacco: Never Used  . Tobacco comment: states "one cigarette a week"  Substance Use Topics  . Alcohol use: Yes    Alcohol/week: 7.0 standard drinks    Types: 7 Shots of liquor per week    Comment:  " shots brandy q night"  . Drug use: No    Prior to Admission medications   Medication Sig Start Date End Date Taking? Authorizing Provider  afatinib dimaleate (GILOTRIF) 30 MG tablet Take 1 tablet (30 mg total) by mouth daily. Take on an empty stomach 1hr before or 2hrs after meals. 10/09/17   Volanda Napoleon, MD  allopurinol (ZYLOPRIM) 100 MG tablet TAKE 1 TABLET(100 MG) BY MOUTH AT BEDTIME 09/05/17   Carollee Herter, Alferd Apa, DO  ALPRAZolam (XANAX) 0.25 MG tablet Take 1 tablet (0.25 mg total) by mouth 3 (three) times daily as needed for anxiety. 09/26/16   Volanda Napoleon, MD  aspirin 81 MG tablet Take 81 mg by mouth daily.    [provider]  cetirizine (ZYRTEC) 10 MG tablet Take 10 mg by mouth at bedtime.    [provider]  diphenoxylate-atropine (LOMOTIL) 2.5-0.025 MG tablet Take 1 tablet by mouth 4 (four) times daily as needed for diarrhea or loose stools. 03/20/17   Volanda Napoleon, MD  lidocaine-prilocaine (EMLA) cream APPLY EXTERNALLY TO THE AFFECTED AREA 1 TIME 03/20/17   Volanda Napoleon, MD  LORazepam (ATIVAN) 0.5 MG tablet Take 1 tablet (0.5 mg total) by mouth every 6 (six) hours as needed (Nausea or vomiting). 03/13/16   Volanda Napoleon, MD  megestrol (MEGACE) 40 MG/ML suspension TAKE 10 ML BY MOUTH DAILY 09/20/17   Volanda Napoleon, MD  metoprolol succinate (TOPROL-XL) 25 MG 24 hr tablet TAKE 1 TABLET BY MOUTH EVERY MORNING* NEEDS OFFICE VISIT BEFORE ANY MORE REFILLS 10/20/17   Roma Schanz R, DO  mirtazapine (REMERON SOL-TAB) 15 MG disintegrating tablet DISSOLVE 1 TABLET UNDER THE TONGUE DAILY AT BEDTIME 09/09/17   Carollee Herter, Alferd Apa, DO  Multiple Vitamins-Minerals (HM MULTIVITAMIN ADULT GUMMY) CHEW Chew 2 tablets by mouth every morning.    [provider]  nicotine (NICODERM CQ - DOSED IN MG/24 HR) 7 mg/24hr patch Place 7 mg onto the skin daily.    [provider]  omeprazole (PRILOSEC) 20 MG capsule TAKE 1 CAPSULE(20 MG) BY MOUTH TWICE DAILY BEFORE A MEAL 09/09/17   Carollee Herter, Yvonne R, DO  ondansetron (ZOFRAN-ODT) 8 MG disintegrating tablet Take 1 tablet (8 mg total) by mouth every 8 (eight) hours as needed for nausea or vomiting. Reported on 05/17/2015 03/05/16   Ann Held, DO  oxandrolone (OXANDRIN) 10 MG tablet Take 1 tablet (10 mg total) by mouth daily with breakfast. 11/14/16   Volanda Napoleon, MD  PARoxetine (PAXIL) 10 MG tablet TAKE  1 TABLET BY MOUTH DAILY FOR DEPRESSION 10/20/17   Carollee Herter, Alferd Apa, DO  predniSONE (DELTASONE) 10 MG tablet TAKE 3 TABLETS PO QD FOR 3 DAYS THEN TAKE 2 TABLETS PO QD FOR 3 DAYS THEN TAKE 1 TABLET PO QD FOR 3 DAYS THEN TAKE 1/2 TAB PO QD FOR 3 DAYS 06/26/17   Carollee Herter, Yvonne R, DO  [name] 0.625 MG tablet TAKE 1 TABLET BY MOUTH EVERY DAY FOR 21 DAYS, DO NOT TAKE FOR 7 DAYS AS DIRECTED 10/10/17   Ann Held, DO  traMADol (ULTRAM) 50 MG tablet Take 1 tablet (50 mg total) by mouth every 6 (six) hours as needed. 09/26/16   Volanda Napoleon, MD  traZODone (DESYREL) 50 MG tablet TAKE 1 TABLET(50 MG) BY MOUTH AT BEDTIME. NEED FOLLOW UP 10/10/17   Ann Held, DO    Allergies Augmentin [amoxicillin-pot clavulanate]; Morphine and related; Codeine; and Morphine   REVIEW OF SYSTEMS  Negative except as noted here or in the History of Present Illness.   PHYSICAL EXAMINATION  Initial Vital Signs Blood pressure (!) 196/88, pulse (!) 102, resp. rate 18, height 5\' 7"  (1.702 m), weight 45.8 kg, SpO2 98 %.  Examination General: Well-developed, cachectic female in no acute distress; appearance consistent with age of record HENT: normocephalic; atraumatic Eyes: pupils equal, round and sluggish; extraocular muscles intact Neck: supple; no meningeal signs; nontender Heart: regular rate and rhythm Lungs: clear to auscultation bilaterally Abdomen: soft; nondistended; nontender; bowel sounds present Extremities: No deformity; full range of motion; pulses normal Neurologic: Awake, alert and oriented; motor function intact in all extremities and symmetric; no facial droop Skin: Warm and dry Psychiatric: Normal mood and affect   RESULTS  Summary of this visit's results, reviewed by myself:   EKG Interpretation  Date/Time:    Ventricular Rate:    PR Interval:    QRS Duration:   QT Interval:    QTC Calculation:   R Axis:     Text Interpretation:        Laboratory  Studies: Results for orders placed or performed during the hospital encounter of 10/28/17 (from the past 24 hour(s))  Urinalysis, Routine w reflex microscopic     Status: Abnormal   Collection Time: 10/28/17  4:53 AM  Result Value Ref Range   Color, Urine YELLOW YELLOW   APPearance CLEAR CLEAR   Specific Gravity, Urine 1.010 1.005 - 1.030   pH 5.5 5.0 - 8.0   Glucose, UA NEGATIVE NEGATIVE mg/dL   Hgb urine dipstick NEGATIVE NEGATIVE   Bilirubin Urine NEGATIVE NEGATIVE   Ketones, ur NEGATIVE NEGATIVE mg/dL   Protein, ur 100 (A) NEGATIVE mg/dL   Nitrite NEGATIVE NEGATIVE   Leukocytes, UA SMALL (A) NEGATIVE  CBC with Differential/Platelet     Status: Abnormal   Collection Time: 10/28/17  4:53 AM  Result Value Ref Range   WBC 14.7 (H) 4.0 - 10.5 K/uL   RBC 3.81 (L) 3.87 - 5.11 MIL/uL   Hemoglobin 12.3 12.0 - 15.0 g/dL   HCT 38.0 36.0 - 46.0 %   MCV 99.7 78.0 - 100.0 fL   MCH 32.3 26.0 - 34.0 pg   MCHC 32.4 30.0 - 36.0 g/dL   RDW 14.1 11.5 - 15.5 %   Platelets 336 150 - 400 K/uL   Neutrophils Relative % 74 %   Neutro Abs 10.8 1.7 - 7.7 K/uL   Lymphocytes Relative 13 %   Lymphs Abs 1.9 0.7 - 4.0 K/uL   Monocytes Relative 10 %   Monocytes Absolute 1.5 (H) 0.1 - 1.0 K/uL   Eosinophils Relative 3 %   Eosinophils Absolute 0.5 0.0 - 0.7 K/uL   Basophils Relative 0 %   Basophils Absolute 0.1 0.0 - 0.1 K/uL   WBC Morphology WHITE COUNT CONFIRMED ON SMEAR    Smear Review PLATELET COUNT CONFIRMED BY SMEAR   Basic metabolic panel     Status: Abnormal   Collection Time: 10/28/17  4:53 AM  Result Value Ref Range   Sodium 137 135 - 145 mmol/L  Potassium 5.0 3.5 - 5.1 mmol/L   Chloride 99 98 - 111 mmol/L   CO2 27 22 - 32 mmol/L   Glucose, Bld 107 (H) 70 - 99 mg/dL   BUN 76 (H) 8 - 23 mg/dL   Creatinine, Ser 2.20 (H) 0.44 - 1.00 mg/dL   Calcium 8.7 (L) 8.9 - 10.3 mg/dL   GFR calc non Af Amer 20 (L) >60 mL/min   GFR calc Af Amer 23 (L) >60 mL/min   Anion gap 11 5 - 15  Urinalysis,  Microscopic (reflex)     Status: Abnormal   Collection Time: 10/28/17  4:53 AM  Result Value Ref Range   RBC / HPF 0-5 0 - 5 RBC/hpf   WBC, UA 21-50 0 - 5 WBC/hpf   Bacteria, UA FEW (A) NONE SEEN   Squamous Epithelial / LPF 0-5 0 - 5   Imaging Studies: Ct Head Wo Contrast  Result Date: 10/28/2017 CLINICAL DATA:  83 year old female with headache. History of small cell lung cancer. EXAM: CT HEAD WITHOUT CONTRAST TECHNIQUE: Contiguous axial images were obtained from the base of the skull through the vertex without intravenous contrast. COMPARISON:  None. FINDINGS: Brain: There is moderate age-related atrophy and chronic microvascular ischemic changes. Focal area of old infarct in the left frontal lobe. There is no acute intracranial hemorrhage. No mass effect or midline shift. No extra-axial fluid collection. Vascular: No hyperdense vessel or unexpected calcification. Skull: Normal. Negative for fracture or focal lesion. Sinuses/Orbits: Complete opacification of the right mastoid air cells. There is dilatation of the right jugular foramen. The remainder of the visualized paranasal sinuses and left mastoid air cells are clear. Postsurgical changes of right mastoid surgery. Other: None IMPRESSION: 1. No acute intracranial pathology. 2. Age-related atrophy and chronic microvascular ischemic changes. 3. Postsurgical changes of the right mastoid bone with right mastoid effusion. 4. Dilated right jugular foramen without irregular or disrupted cortication. Electronically Signed   By: Anner Crete M.D.   On: 10/28/2017 05:18    ED COURSE and MDM  Nursing notes and initial vitals signs, including pulse oximetry, reviewed.  Vitals:   10/28/17 0341  BP: (!) 196/88  Pulse: (!) 102  Resp: 18  SpO2: 98%  Weight: 45.8 kg  Height: 5\' 7"  (1.702 m)   5:41 AM Headache improved after IV Reglan.  Normal saline bolus given elevated BUN and creatinine.  This is possibly due to the new gastric chemotherapeutic  agent or possibly fluid loss through resultant diarrhea.  Patient would prefer not to be treated for urinary tract infection at this time as she has frequent UTIs and history of C. difficile due to antibiotic use.  She has a follow-up appointment with her oncologist the day after tomorrow and will have him review culture results and decide whether to treat.  She was advised to limit her use of Aleve given her renal insufficiency.   PROCEDURES    ED DIAGNOSES     ICD-10-CM   1. Bad headache R51   2. AKI (acute kidney injury) ([place]) N17.9        Ailyne Pawley, MD 10/28/17 [phone]

## 2017-10-28 NOTE — ED Notes (Signed)
Patient c/o intermittent headache X 2 months which she states worsened tonight; she reports starting a new chemo medication Gilotrif and wonders if this may be a side effect.

## 2017-10-29 LAB — URINE CULTURE

## 2017-10-30 ENCOUNTER — Inpatient Hospital Stay (HOSPITAL_BASED_OUTPATIENT_CLINIC_OR_DEPARTMENT_OTHER): Payer: Medicare Other | Admitting: Hematology & Oncology

## 2017-10-30 ENCOUNTER — Inpatient Hospital Stay: Payer: Medicare Other

## 2017-10-30 ENCOUNTER — Encounter: Payer: Self-pay | Admitting: Hematology & Oncology

## 2017-10-30 ENCOUNTER — Other Ambulatory Visit: Payer: Self-pay

## 2017-10-30 ENCOUNTER — Other Ambulatory Visit: Payer: Self-pay | Admitting: *Deleted

## 2017-10-30 VITALS — BP 161/68 | HR 102 | Temp 98.1°F | Resp 19 | Wt 102.0 lb

## 2017-10-30 DIAGNOSIS — E43 Unspecified severe protein-calorie malnutrition: Secondary | ICD-10-CM

## 2017-10-30 DIAGNOSIS — K909 Intestinal malabsorption, unspecified: Secondary | ICD-10-CM

## 2017-10-30 DIAGNOSIS — C3431 Malignant neoplasm of lower lobe, right bronchus or lung: Secondary | ICD-10-CM | POA: Diagnosis not present

## 2017-10-30 DIAGNOSIS — D500 Iron deficiency anemia secondary to blood loss (chronic): Secondary | ICD-10-CM | POA: Diagnosis not present

## 2017-10-30 DIAGNOSIS — R319 Hematuria, unspecified: Secondary | ICD-10-CM

## 2017-10-30 DIAGNOSIS — Z9181 History of falling: Secondary | ICD-10-CM | POA: Diagnosis not present

## 2017-10-30 LAB — CBC WITH DIFFERENTIAL (CANCER CENTER ONLY)
Basophils Absolute: 0.1 10*3/uL (ref 0.0–0.1)
Basophils Relative: 0 %
Eosinophils Absolute: 0.5 10*3/uL (ref 0.0–0.5)
Eosinophils Relative: 4 %
HCT: 34.5 % — ABNORMAL LOW (ref 34.8–46.6)
Hemoglobin: 11 g/dL — ABNORMAL LOW (ref 11.6–15.9)
LYMPHS ABS: 1.3 10*3/uL (ref 0.9–3.3)
LYMPHS PCT: 9 %
MCH: 32.3 pg (ref 26.0–34.0)
MCHC: 31.9 g/dL — ABNORMAL LOW (ref 32.0–36.0)
MCV: 101.2 fL — AB (ref 81.0–101.0)
MONO ABS: 0.8 10*3/uL (ref 0.1–0.9)
MONOS PCT: 6 %
Neutro Abs: 10.8 10*3/uL — ABNORMAL HIGH (ref 1.5–6.5)
Neutrophils Relative %: 81 %
Platelet Count: 320 10*3/uL (ref 145–400)
RBC: 3.41 MIL/uL — ABNORMAL LOW (ref 3.70–5.32)
RDW: 13.8 % (ref 11.1–15.7)
WBC Count: 13.3 10*3/uL — ABNORMAL HIGH (ref 3.9–10.0)

## 2017-10-30 LAB — CMP (CANCER CENTER ONLY)
ALT: 20 U/L (ref 10–47)
ANION GAP: 6 (ref 5–15)
AST: 29 U/L (ref 11–38)
Albumin: 3.2 g/dL — ABNORMAL LOW (ref 3.5–5.0)
Alkaline Phosphatase: 67 U/L (ref 26–84)
BUN: 75 mg/dL — ABNORMAL HIGH (ref 7–22)
CO2: 25 mmol/L (ref 18–33)
Calcium: 8.6 mg/dL (ref 8.0–10.3)
Chloride: 104 mmol/L (ref 98–108)
Creatinine: 2.3 mg/dL — ABNORMAL HIGH (ref 0.60–1.20)
Glucose, Bld: 165 mg/dL — ABNORMAL HIGH (ref 73–118)
POTASSIUM: 4.5 mmol/L (ref 3.3–4.7)
Sodium: 135 mmol/L (ref 128–145)
Total Bilirubin: 0.4 mg/dL (ref 0.2–1.6)
Total Protein: 6.4 g/dL (ref 6.4–8.1)

## 2017-10-30 MED ORDER — HEPARIN SOD (PORK) LOCK FLUSH 100 UNIT/ML IV SOLN
500.0000 [IU] | Freq: Once | INTRAVENOUS | Status: AC
  Administered 2017-10-30: 500 [IU] via INTRAVENOUS
  Filled 2017-10-30: qty 5

## 2017-10-30 MED ORDER — SODIUM CHLORIDE 0.9 % IV SOLN
Freq: Once | INTRAVENOUS | Status: AC
  Administered 2017-10-30: 15:00:00 via INTRAVENOUS
  Filled 2017-10-30: qty 250

## 2017-10-30 MED ORDER — SODIUM CHLORIDE 0.9% FLUSH
10.0000 mL | INTRAVENOUS | Status: DC | PRN
  Administered 2017-10-30: 10 mL via INTRAVENOUS
  Filled 2017-10-30: qty 10

## 2017-10-30 NOTE — Patient Instructions (Signed)
Implanted Port Insertion, Care After °This sheet gives you information about how to care for yourself after your procedure. Your health care provider may also give you more specific instructions. If you have problems or questions, contact your health care provider. °What can I expect after the procedure? °After your procedure, it is common to have: °· Discomfort at the port insertion site. °· Bruising on the skin over the port. This should improve over 3-4 days. ° °Follow these instructions at home: °Port care °· After your port is placed, you will get a manufacturer's information card. The card has information about your port. Keep this card with you at all times. °· Take care of the port as told by your health care provider. Ask your health care provider if you or a family member can get training for taking care of the port at home. A home health care nurse may also take care of the port. °· Make sure to remember what type of port you have. °Incision care °· Follow instructions from your health care provider about how to take care of your port insertion site. Make sure you: °? Wash your hands with soap and water before you change your bandage (dressing). If soap and water are not available, use hand sanitizer. °? Change your dressing as told by your health care provider. °? Leave stitches (sutures), skin glue, or adhesive strips in place. These skin closures may need to stay in place for 2 weeks or longer. If adhesive strip edges start to loosen and curl up, you may trim the loose edges. Do not remove adhesive strips completely unless your health care provider tells you to do that. °· Check your port insertion site every day for signs of infection. Check for: °? More redness, swelling, or pain. °? More fluid or blood. °? Warmth. °? Pus or a bad smell. °General instructions °· Do not take baths, swim, or use a hot tub until your health care provider approves. °· Do not lift anything that is heavier than 10 lb (4.5  kg) for a week, or as told by your health care provider. °· Ask your health care provider when it is okay to: °? Return to work or school. °? Resume usual physical activities or sports. °· Do not drive for 24 hours if you were given a medicine to help you relax (sedative). °· Take over-the-counter and prescription medicines only as told by your health care provider. °· Wear a medical alert bracelet in case of an emergency. This will tell any health care providers that you have a port. °· Keep all follow-up visits as told by your health care provider. This is important. °Contact a health care provider if: °· You cannot flush your port with saline as directed, or you cannot draw blood from the port. °· You have a fever or chills. °· You have more redness, swelling, or pain around your port insertion site. °· You have more fluid or blood coming from your port insertion site. °· Your port insertion site feels warm to the touch. °· You have pus or a bad smell coming from the port insertion site. °Get help right away if: °· You have chest pain or shortness of breath. °· You have bleeding from your port that you cannot control. °Summary °· Take care of the port as told by your health care provider. °· Change your dressing as told by your health care provider. °· Keep all follow-up visits as told by your health care provider. °  This information is not intended to replace advice given to you by your health care provider. Make sure you discuss any questions you have with your health care provider. °Document Released: 12/09/2012 Document Revised: 01/10/2016 Document Reviewed: 01/10/2016 °Elsevier Interactive Patient Education © 2017 Elsevier Inc. ° °

## 2017-10-30 NOTE — Progress Notes (Signed)
Hematology and Oncology Follow Up Visit  Kindred Heying [id number] June 09, 1934 83 y.o. 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 Roca is here today with her daughter for follow-up.  Unfortunately, she really could not tolerate afatinib at all.  After only 4 days, she had to go to the emergency room.  She had diarrhea.  She fell.  She really tore up her right knee.  She was not eating.  She really was miserable.  I think it is obvious that she is not going to be a candidate for targeted therapy.  I thought we might be able to use this but this is not can be the case.  We clearly have to make sure that her quality of life is focused upon.  I really do not think she is a candidate for chemotherapy.  Her performance status is just not that good (ECOG 3 at best) and I think that toxicity far outweighs benefit.  I am pretty sure her daughter agrees.  Her daughter talk to me in private about her situation.  She was wondering if we need to get hospice involved yet.  I told her that I thought that this might be something that we would get her into before the end of the year.  She is not hurting which is a blessing.  She has had no headache.  She has had no leg swelling.  She has had no bleeding, so that when she fell and took the skin of her right knee.  Again, her performance status is no better than ECOG 3.   Medications:  Allergies as of 10/30/2017      Reactions   Augmentin [amoxicillin-pot Clavulanate] Diarrhea   c diff   Morphine And Related Other (See Comments)   Hallucinations and  combative   Codeine Nausea Only   Morphine    delusions      Medication List        Accurate as of 10/30/17  5:13 PM. Always use your most recent med list.          afatinib dimaleate 30 MG tablet Commonly known as:  GILOTRIF Take 1 tablet (30 mg total) by mouth daily. Take on an empty stomach 1hr before or 2hrs after meals.   allopurinol 100 MG tablet Commonly known as:  ZYLOPRIM TAKE 1 TABLET(100 MG) BY MOUTH AT BEDTIME   ALPRAZolam 0.25 MG tablet Commonly known as:  XANAX Take 1 tablet (0.25 mg total) by mouth 3 (three) times daily as needed for anxiety.   aspirin 81 MG tablet Take 81 mg by mouth daily.   cetirizine 10 MG tablet Commonly known as:  ZYRTEC Take 10 mg by mouth at bedtime.   diphenoxylate-atropine 2.5-0.025 MG tablet Commonly known as:  LOMOTIL Take 1 tablet by mouth 4 (four) times daily as needed for diarrhea or loose stools.   HM MULTIVITAMIN ADULT GUMMY Chew Chew 2 tablets by mouth every morning.   lidocaine-prilocaine cream Commonly known as:  EMLA APPLY EXTERNALLY TO THE AFFECTED AREA 1 TIME   LORazepam 0.5 MG tablet Commonly known as:  ATIVAN Take 1 tablet (0.5 mg total) by mouth every 6 (six) hours as needed (Nausea or vomiting).   megestrol 40 MG/ML suspension Commonly known as:  MEGACE TAKE 10 ML BY MOUTH DAILY   metoCLOPramide 5 MG tablet Commonly known as:  REGLAN Take 1 tablet (5 mg total) by mouth every 6 (six) hours as needed for nausea or vomiting (or headache).   metoprolol succinate 25 MG 24 hr tablet Commonly known as:  TOPROL-XL TAKE 1 TABLET BY MOUTH EVERY MORNING* NEEDS OFFICE VISIT BEFORE ANY MORE REFILLS   mirtazapine 15 MG disintegrating tablet Commonly known as:  REMERON SOL-TAB DISSOLVE 1 TABLET UNDER THE TONGUE DAILY AT BEDTIME   nicotine 7 mg/24hr patch Commonly known as:  NICODERM CQ - dosed in mg/24 hr Place 7 mg onto the skin daily.   omeprazole 20 MG capsule Commonly known as:  PRILOSEC TAKE 1  CAPSULE(20 MG) BY MOUTH TWICE DAILY BEFORE A MEAL   ondansetron 8 MG disintegrating tablet Commonly known as:  ZOFRAN-ODT Take 1 tablet (8 mg total) by mouth every 8 (eight) hours as needed for nausea or vomiting. Reported on 05/17/2015   oxandrolone 10 MG tablet Commonly known as:  OXANDRIN Take 1 tablet (10 mg total) by mouth daily with breakfast.   PARoxetine 10 MG tablet Commonly known as:  PAXIL TAKE 1 TABLET BY MOUTH DAILY FOR DEPRESSION   predniSONE 10 MG tablet Commonly known as:  DELTASONE TAKE 3 TABLETS PO QD FOR 3 DAYS THEN TAKE 2 TABLETS PO QD FOR 3 DAYS THEN TAKE 1 TABLET PO QD FOR 3 DAYS THEN TAKE 1/2 TAB PO QD FOR 3 DAYS   [name] 0.625 MG tablet Generic drug:  estrogens (conjugated) TAKE 1 TABLET BY MOUTH EVERY DAY FOR 21 DAYS, DO NOT TAKE FOR 7 DAYS AS DIRECTED   traMADol 50 MG tablet Commonly known as:  ULTRAM Take 1 tablet (50 mg total) by mouth every 6 (six) hours as needed.   traZODone 50 MG tablet Commonly known as:  DESYREL TAKE 1 TABLET(50 MG) BY MOUTH AT BEDTIME. NEED FOLLOW UP       Allergies:  Allergies  Allergen Reactions  . Augmentin [Amoxicillin-Pot Clavulanate] Diarrhea    c diff  . Morphine And Related Other (See Comments)    Hallucinations and combative  . Codeine Nausea Only  . Morphine     delusions    Past Medical History, Surgical history, Social history, and Family History were reviewed and updated.  Review of Systems:  Review of Systems  Constitutional: Negative.   HENT: Negative.   Eyes: Negative.   Respiratory: Negative.   Cardiovascular: Negative.   Gastrointestinal: Negative.   Genitourinary: Negative.   Musculoskeletal: Negative.   Skin: Negative.   Neurological: Negative.   Endo/Heme/Allergies: Negative.   Psychiatric/Behavioral: Negative.       Physical Exam:  weight is 102 lb (46.3 kg). Her oral temperature is 98.1 F (36.7 C). Her blood pressure is 161/68 (abnormal) and her pulse is 102 (abnormal). Her  respiration is 19 and oxygen saturation is 98%.   Wt Readings from Last 3 Encounters:  10/30/17 102 lb (46.3 kg)  10/28/17 101 lb (45.8 kg)  10/09/17 101 lb (45.8 kg)    Physical Exam  Constitutional: She is oriented to person, place, and time.  HENT:  Head: Normocephalic and atraumatic.  Mouth/Throat: Oropharynx is clear and moist.  Eyes: Pupils are equal, round, and reactive to light. EOM are normal.  Neck: Normal range of motion.  Cardiovascular: Normal rate, regular rhythm and normal heart sounds.  Pulmonary/Chest: Effort normal and breath sounds normal.  Abdominal: Soft. Bowel sounds are normal.  Musculoskeletal: Normal range of motion. She exhibits no edema, tenderness or deformity.  Lymphadenopathy:    She has no cervical adenopathy.  Neurological: She is alert and oriented to person, place, and time.  Skin: Skin is warm and dry. No rash noted. No erythema.  Psychiatric: She has a normal mood and affect. Her behavior is normal. Judgment and thought content normal.  Vitals reviewed.    Lab Results  Component Value Date   WBC 13.3 (H) 10/30/2017   HGB 11.0 (L) 10/30/2017   HCT 34.5 (L) 10/30/2017   MCV 101.2 (H) 10/30/2017   PLT 320 10/30/2017   Lab Results  Component Value Date   FERRITIN 64 10/09/2017   IRON 102 10/09/2017   TIBC 333 10/09/2017   UIBC 230 10/09/2017   IRONPCTSAT 31 10/09/2017   Lab Results  Component Value Date   RBC 3.41 (L) 10/30/2017   No results found for: KPAFRELGTCHN, LAMBDASER, KAPLAMBRATIO No results found for: Kandis Cocking, IGMSERUM No results found for: Odetta Pink, SPEI   Chemistry      Component Value Date/Time   NA 135 10/30/2017 1350   NA 140 01/30/2017 1138   NA 136 03/07/2016 1055   K 4.5 10/30/2017 1350   K 4.2 01/30/2017 1138   K 4.1 03/07/2016 1055   CL 104 10/30/2017 1350   CL 102 01/30/2017 1138   CO2 25 10/30/2017 1350   CO2 28 01/30/2017 1138   CO2 26  03/07/2016 1055   BUN 75 (H) 10/30/2017 1350   BUN 34 (H) 01/30/2017 1138   BUN 28.7 (H) 03/07/2016 1055   CREATININE 2.30 (H) 10/30/2017 1350   CREATININE 2.7 (H) 01/30/2017 1138   CREATININE 1.3 (H) 03/07/2016 1055      Component Value Date/Time   CALCIUM 8.6 10/30/2017 1350   CALCIUM 9.3 01/30/2017 1138   CALCIUM 9.5 03/07/2016 1055   ALKPHOS 67 10/30/2017 1350   ALKPHOS 59 01/30/2017 1138   ALKPHOS 83 03/07/2016 1055   AST 29 10/30/2017 1350   AST 17 03/07/2016 1055   ALT 20 10/30/2017 1350   ALT 16 01/30/2017 1138   ALT 9 03/07/2016 1055   BILITOT 0.4 10/30/2017 1350   BILITOT 0.26 03/07/2016 1055      Impression and Plan: Ms. Maultsby is a very pleasant 83 yo caucasian female with locally advanced, inoperable, stage IIIb squamous cell carcinoma of the right lung.  She had done very well with immunotherapy with Keytruda.  She is not a candidate for chemotherapy.  I spent about 40 minutes with him today.  All the time spent face-to-face with them.  I helped counseled them.  I talked her about the fact that we likely not going to do any more therapy.  She is okay with this.  I would like to see her back in about 3 weeks or so.  I want to see how she is feeling.  Hopefully, she will be feeling a little bit better.  I probably get a PET scan on her in early November to see how she has progressed.  At that time, I would think hospice might be the right time to "jump on board."  Volanda Napoleon, MD 8/29/20195:13 PM

## 2017-10-31 DIAGNOSIS — D500 Iron deficiency anemia secondary to blood loss (chronic): Secondary | ICD-10-CM | POA: Diagnosis not present

## 2017-10-31 DIAGNOSIS — C3431 Malignant neoplasm of lower lobe, right bronchus or lung: Secondary | ICD-10-CM | POA: Diagnosis not present

## 2017-10-31 DIAGNOSIS — Z9181 History of falling: Secondary | ICD-10-CM | POA: Diagnosis not present

## 2017-10-31 LAB — URINALYSIS, COMPLETE (UACMP) WITH MICROSCOPIC
Bilirubin Urine: NEGATIVE
Glucose, UA: NEGATIVE mg/dL
KETONES UR: NEGATIVE mg/dL
NITRITE: NEGATIVE
PROTEIN: 30 mg/dL — AB
Specific Gravity, Urine: 1.01 (ref 1.005–1.030)
pH: 5.5 (ref 5.0–8.0)

## 2017-10-31 LAB — LACTATE DEHYDROGENASE: LDH: 225 U/L — ABNORMAL HIGH (ref 98–192)

## 2017-11-04 ENCOUNTER — Telehealth: Payer: Self-pay | Admitting: *Deleted

## 2017-11-04 NOTE — Telephone Encounter (Addendum)
Patient's daughter is aware of results. Appointment made to bring in urine specimen   ----- Message from Volanda Napoleon, MD sent at 11/04/2017  6:56 AM EDT ----- Call her dgtr -- there is likely another UTI.  We really need to get a urine culture!!  Laurey Arrow

## 2017-11-05 ENCOUNTER — Other Ambulatory Visit: Payer: Self-pay | Admitting: *Deleted

## 2017-11-05 DIAGNOSIS — R319 Hematuria, unspecified: Secondary | ICD-10-CM

## 2017-11-06 ENCOUNTER — Inpatient Hospital Stay: Payer: Medicare Other | Attending: Hematology & Oncology

## 2017-11-06 ENCOUNTER — Ambulatory Visit (INDEPENDENT_AMBULATORY_CARE_PROVIDER_SITE_OTHER): Payer: Medicare Other | Admitting: Family Medicine

## 2017-11-06 ENCOUNTER — Encounter: Payer: Self-pay | Admitting: Family Medicine

## 2017-11-06 VITALS — BP 193/87 | HR 100 | Temp 97.7°F | Resp 16 | Ht 67.0 in | Wt 101.6 lb

## 2017-11-06 DIAGNOSIS — R82998 Other abnormal findings in urine: Secondary | ICD-10-CM | POA: Diagnosis not present

## 2017-11-06 DIAGNOSIS — F410 Panic disorder [episodic paroxysmal anxiety] without agoraphobia: Secondary | ICD-10-CM | POA: Diagnosis not present

## 2017-11-06 DIAGNOSIS — Z23 Encounter for immunization: Secondary | ICD-10-CM

## 2017-11-06 DIAGNOSIS — C3431 Malignant neoplasm of lower lobe, right bronchus or lung: Secondary | ICD-10-CM | POA: Insufficient documentation

## 2017-11-06 DIAGNOSIS — Z79899 Other long term (current) drug therapy: Secondary | ICD-10-CM | POA: Insufficient documentation

## 2017-11-06 DIAGNOSIS — F411 Generalized anxiety disorder: Secondary | ICD-10-CM

## 2017-11-06 DIAGNOSIS — R319 Hematuria, unspecified: Secondary | ICD-10-CM

## 2017-11-06 DIAGNOSIS — I10 Essential (primary) hypertension: Secondary | ICD-10-CM

## 2017-11-06 DIAGNOSIS — K909 Intestinal malabsorption, unspecified: Secondary | ICD-10-CM | POA: Insufficient documentation

## 2017-11-06 DIAGNOSIS — D509 Iron deficiency anemia, unspecified: Secondary | ICD-10-CM | POA: Diagnosis not present

## 2017-11-06 MED ORDER — METOPROLOL SUCCINATE ER 50 MG PO TB24: 50.0000 mg | ORAL_TABLET | Freq: Every day | ORAL | 3 refills | Status: DC

## 2017-11-06 MED ORDER — ALPRAZOLAM 0.25 MG PO TABS: 0.2500 mg | ORAL_TABLET | Freq: Three times a day (TID) | ORAL | 1 refills | Status: DC | PRN

## 2017-11-06 NOTE — Assessment & Plan Note (Signed)
Poorly controlled will alter medications, encouraged DASH diet, minimize caffeine and obtain adequate sleep. Report concerning symptoms and follow up as directed and as needed 

## 2017-11-06 NOTE — Progress Notes (Signed)
Patient ID: Marissa Cruz, female   DOB: 1934/05/27, 83 y.o.   MRN: [id number]    Subjective:  I acted as a Education administrator for Dr. Carollee Herter.  Guerry Bruin, [place]   Patient ID: Marissa Cruz, female    DOB: Jan 19, 1935, 83 y.o.   MRN: [id number]  Chief Complaint  Patient presents with  . Hypertension    HPI  Patient is in today for follow up blood pressure.  Her bp has been running high and she has had headaches.  No chest pain.  Patient Care Team: Carollee Herter, Alferd Apa, DO as PCP - General (Family Medicine) Ladene Artist, MD as Consulting Physician (Gastroenterology) Michaela Corner, DDS as Consulting Physician (Oral Surgery) Renda Rolls Jennefer Bravo, MD as Referring Physician (Dermatology) Milus Height, MD as Referring Physician (Ophthalmology)   Past Medical History:  Diagnosis Date  . Age-related macular degeneration, dry, right eye   . Age-related macular degeneration, wet, left eye ([place])   . Anemia   . Anxiety   . Arthritis    "thumbs, hands" (08/18/2014)  . CAP (community acquired pneumonia) 08/19/2014  . Carotid artery disease ([place])   . Chicken pox   . Chronic lower back pain   . Colon polyp   . Depression   . Diverticulitis   . GERD (gastroesophageal reflux disease)   . Goals of care, counseling/discussion 03/07/2016  . History of blood transfusion 1976; 2013   "w/hysterectomy; hip OR"  . History of gout   . Hypertension   . Iron deficiency anemia due to chronic blood loss 06/06/2016  . 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 Kidney disease   . 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 TIA (transient ischemic attack) ~ 2009 X 2  . Vocal cord cancer ([place]) ~ 2009   S/P radiation    Past Surgical History:  Procedure Laterality Date  . ABDOMINAL HYSTERECTOMY  1976  . APPENDECTOMY  1954  . CAROTID ENDARTERECTOMY Left ~ 2014  . CATARACT EXTRACTION W/ INTRAOCULAR LENS  IMPLANT, BILATERAL Bilateral ~ 2009  . COLONOSCOPY N/A 01/31/2014   Procedure:  COLONOSCOPY;  Surgeon: Ladene Artist, MD;  Location: [facility];  Service: Endoscopy;  Laterality: N/A;  . COLONOSCOPY N/A 07/15/2016   Procedure: COLONOSCOPY;  Surgeon: Milus Banister, MD;  Location: [facility];  Service: Endoscopy;  Laterality: N/A;  . DILATION AND CURETTAGE OF UTERUS    . IR GENERIC HISTORICAL  03/12/2016   IR US GUIDE VASC ACCESS RIGHT 03/12/2016 Jacqulynn Cadet, MD [facility]  . IR GENERIC HISTORICAL  03/12/2016   IR FLUORO GUIDE PORT INSERTION RIGHT 03/12/2016 Jacqulynn Cadet, MD [facility]  . JOINT REPLACEMENT    . MOUTH FLOOR MASS EXCISION  03/2014   "removed cancer underneath my tongue"  . TONSILLECTOMY  1941  . TOTAL HIP ARTHROPLASTY Left 2013    Family History  Problem Relation Age of Onset  . Hypertension Father   . Skin cancer Father   . Emphysema Paternal Grandfather   . Liver cancer Mother   . Stomach cancer Maternal Grandfather     Social History   Socioeconomic History  . Marital status: Widowed    Spouse name: Not on file  . Number of children: 1  . Years of education: Not on file  . Highest education level: Not on file  Occupational History  . Occupation: Retired  Scientific laboratory technician  . Financial resource strain: Not on file  . Food insecurity:    Worry: Not  on file    Inability: Not on file  . Transportation needs:    Medical: Not on file    Non-medical: Not on file  Tobacco Use  . Smoking status: Light Tobacco Smoker    Packs/day: 1.00    Years: 60.00    Pack years: 60.00    Types: Cigarettes    Last attempt to quit: 05/15/2016    Years since quitting: 1.4  . Smokeless tobacco: Never Used  . Tobacco comment: states "one cigarette a week"  Substance and Sexual Activity  . Alcohol use: Yes    Alcohol/week: 7.0 standard drinks    Types: 7 Shots of liquor per week    Comment:  " shots brandy q night"  . Drug use: No  . Sexual activity: Not Currently  Lifestyle  . Physical activity:    Days per week: Not on file    Minutes  per session: Not on file  . Stress: Not on file  Relationships  . Social connections:    Talks on phone: Not on file    Gets together: Not on file    Attends religious service: Not on file    Active member of club or organization: Not on file    Attends meetings of clubs or organizations: Not on file    Relationship status: Not on file  . Intimate partner violence:    Fear of current or ex partner: Not on file    Emotionally abused: Not on file    Physically abused: Not on file    Forced sexual activity: Not on file  Other Topics Concern  . Not on file  Social History Narrative  . Not on file    Outpatient Medications Prior to Visit  Medication Sig Dispense Refill  . allopurinol (ZYLOPRIM) 100 MG tablet TAKE 1 TABLET(100 MG) BY MOUTH AT BEDTIME 90 tablet 1  . aspirin 81 MG tablet Take 81 mg by mouth daily.    . cetirizine (ZYRTEC) 10 MG tablet Take 10 mg by mouth at bedtime.    . diphenoxylate-atropine (LOMOTIL) 2.5-0.025 MG tablet Take 1 tablet by mouth 4 (four) times daily as needed for diarrhea or loose stools. 100 tablet 0  . lidocaine-prilocaine (EMLA) cream APPLY EXTERNALLY TO THE AFFECTED AREA 1 TIME 30 g 0  . megestrol (MEGACE) 40 MG/ML suspension TAKE 10 ML BY MOUTH DAILY 480 mL 0  . mirtazapine (REMERON SOL-TAB) 15 MG disintegrating tablet DISSOLVE 1 TABLET UNDER THE TONGUE DAILY AT BEDTIME 30 tablet 4  . Multiple Vitamins-Minerals (HM MULTIVITAMIN ADULT GUMMY) CHEW Chew 2 tablets by mouth every morning.    . nicotine (NICODERM CQ - DOSED IN MG/24 HR) 7 mg/24hr patch Place 7 mg onto the skin daily.    Marland Kitchen omeprazole (PRILOSEC) 20 MG capsule TAKE 1 CAPSULE(20 MG) BY MOUTH TWICE DAILY BEFORE A MEAL 60 capsule 4  . ondansetron (ZOFRAN-ODT) 8 MG disintegrating tablet Take 1 tablet (8 mg total) by mouth every 8 (eight) hours as needed for nausea or vomiting. Reported on 05/17/2015 90 tablet 5  . oxandrolone (OXANDRIN) 10 MG tablet Take 1 tablet (10 mg total) by mouth daily with  breakfast. 30 tablet 2  . PARoxetine (PAXIL) 10 MG tablet TAKE 1 TABLET BY MOUTH DAILY FOR DEPRESSION 90 tablet 0  . predniSONE (DELTASONE) 10 MG tablet TAKE 3 TABLETS PO QD FOR 3 DAYS THEN TAKE 2 TABLETS PO QD FOR 3 DAYS THEN TAKE 1 TABLET PO QD FOR 3 DAYS THEN TAKE 1/2  TAB PO QD FOR 3 DAYS 20 tablet 0  . [name] 0.625 MG tablet TAKE 1 TABLET BY MOUTH EVERY DAY FOR 21 DAYS, DO NOT TAKE FOR 7 DAYS AS DIRECTED 90 tablet 0  . traMADol (ULTRAM) 50 MG tablet Take 1 tablet (50 mg total) by mouth every 6 (six) hours as needed. 60 tablet 0  . traZODone (DESYREL) 50 MG tablet TAKE 1 TABLET(50 MG) BY MOUTH AT BEDTIME. NEED FOLLOW UP 90 tablet 0  . ALPRAZolam (XANAX) 0.25 MG tablet Take 1 tablet (0.25 mg total) by mouth 3 (three) times daily as needed for anxiety. 30 tablet 0  . LORazepam (ATIVAN) 0.5 MG tablet Take 1 tablet (0.5 mg total) by mouth every 6 (six) hours as needed (Nausea or vomiting). 30 tablet 0  . metoprolol succinate (TOPROL-XL) 25 MG 24 hr tablet TAKE 1 TABLET BY MOUTH EVERY MORNING* NEEDS OFFICE VISIT BEFORE ANY MORE REFILLS 90 tablet 0  . afatinib dimaleate (GILOTRIF) 30 MG tablet Take 1 tablet (30 mg total) by mouth daily. Take on an empty stomach 1hr before or 2hrs after meals. 30 tablet 4  . metoCLOPramide (REGLAN) 5 MG tablet Take 1 tablet (5 mg total) by mouth every 6 (six) hours as needed for nausea or vomiting (or headache). 12 tablet 0   Facility-Administered Medications Prior to Visit  Medication Dose Route Frequency Provider Last Rate Last Dose  . sodium chloride flush (NS) 0.9 % injection 10 mL  10 mL Intracatheter PRN Volanda Napoleon, MD   10 mL at 04/25/16 1355  . sodium chloride flush (NS) 0.9 % injection 10 mL  10 mL Intravenous PRN Volanda Napoleon, MD   10 mL at 03/20/17 1151    Allergies  Allergen Reactions  . Augmentin [Amoxicillin-Pot Clavulanate] Diarrhea    c diff  . Morphine And Related Other (See Comments)    Hallucinations and combative  . Codeine Nausea  Only  . Morphine     delusions    Review of Systems  Constitutional: Negative for fever and malaise/fatigue.  HENT: Negative for congestion.   Eyes: Negative for blurred vision.  Respiratory: Negative for cough and shortness of breath.   Cardiovascular: Negative for chest pain, palpitations and leg swelling.  Gastrointestinal: Negative for vomiting.  Musculoskeletal: Negative for back pain.  Skin: Negative for rash.  Neurological: Negative for loss of consciousness and headaches.       Objective:    Physical Exam  Constitutional: She is oriented to person, place, and time. She appears cachectic.  HENT:  Head: Normocephalic and atraumatic.  Eyes: Conjunctivae and EOM are normal.  Neck: Normal range of motion. Neck supple. No JVD present. Carotid bruit is not present. No thyromegaly present.  Cardiovascular: Normal rate, regular rhythm and normal heart sounds.  No murmur heard. Pulmonary/Chest: Effort normal and breath sounds normal. No respiratory distress. She has no wheezes. She has no rales. She exhibits no tenderness.  Musculoskeletal: She exhibits no edema.  Neurological: She is alert and oriented to person, place, and time.  Psychiatric: She has a normal mood and affect.  Nursing note and vitals reviewed.   BP (!) 193/87   Pulse 100   Temp 97.7 F (36.5 C) (Oral)   Resp 16   Ht _0  (1.702 m)   Wt 101 lb 9.6 oz (46.1 kg)   SpO2 98%   BMI 15.91 kg/m  Wt Readings from Last 3 Encounters:  11/06/17 101 lb 9.6 oz (46.1 kg)  10/30/17 102  lb (46.3 kg)  10/28/17 101 lb (45.8 kg)   BP Readings from Last 3 Encounters:  11/06/17 (!) 193/87  10/30/17 (!) 161/68  10/28/17 (!) 192/110     Immunization History  Administered Date(s) Administered  . Influenza, High Dose Seasonal PF 11/17/2014, 11/21/2015, 01/02/2017, 11/06/2017  . Influenza-Unspecified 12/02/2013  . Pneumococcal Conjugate-13 10/21/2013, 11/17/2014  . Pneumococcal Polysaccharide-23 10/21/2013  .  Tdap 05/02/2013  . Zoster 11/02/2013    Health Maintenance  Topic Date Due  . TETANUS/TDAP  05/03/2023  . INFLUENZA VACCINE  Completed  . DEXA SCAN  Completed  . PNA vac Low Risk Adult  Completed    Lab Results  Component Value Date   WBC 13.3 (H) 10/30/2017   HGB 11.0 (L) 10/30/2017   HCT 34.5 (L) 10/30/2017   PLT 320 10/30/2017   GLUCOSE 165 (H) 10/30/2017   CHOL 183 11/21/2015   TRIG 124.0 11/21/2015   HDL 94.30 11/21/2015   LDLDIRECT 73.2 10/21/2013   LDLCALC 64 11/21/2015   ALT 20 10/30/2017   AST 29 10/30/2017   NA 135 10/30/2017   K 4.5 10/30/2017   CL 104 10/30/2017   CREATININE 2.30 (H) 10/30/2017   BUN 75 (H) 10/30/2017   CO2 25 10/30/2017   TSH 3.535 09/06/2016   INR 1.00 07/13/2016    Lab Results  Component Value Date   TSH 3.535 09/06/2016   Lab Results  Component Value Date   WBC 13.3 (H) 10/30/2017   HGB 11.0 (L) 10/30/2017   HCT 34.5 (L) 10/30/2017   MCV 101.2 (H) 10/30/2017   PLT 320 10/30/2017   Lab Results  Component Value Date   NA 135 10/30/2017   K 4.5 10/30/2017   CHLORIDE 101 03/07/2016   CO2 25 10/30/2017   GLUCOSE 165 (H) 10/30/2017   BUN 75 (H) 10/30/2017   CREATININE 2.30 (H) 10/30/2017   BILITOT 0.4 10/30/2017   ALKPHOS 67 10/30/2017   AST 29 10/30/2017   ALT 20 10/30/2017   PROT 6.4 10/30/2017   ALBUMIN 3.2 (L) 10/30/2017   CALCIUM 8.6 10/30/2017   ANIONGAP 6 10/30/2017   EGFR 38 (L) 03/07/2016   GFR 41.62 (L) 12/21/2015   Lab Results  Component Value Date   CHOL 183 11/21/2015   Lab Results  Component Value Date   HDL 94.30 11/21/2015   Lab Results  Component Value Date   LDLCALC 64 11/21/2015   Lab Results  Component Value Date   TRIG 124.0 11/21/2015   Lab Results  Component Value Date   CHOLHDL 2 11/21/2015   No results found for: HGBA1C       Assessment & Plan:   Problem List Items Addressed This Visit      Unprioritized   Generalized anxiety disorder    With panic attacks Refill xanax  -- if needs more than 1 a day regularly consider adding zoloft       Relevant Medications   ALPRAZolam (XANAX) 0.25 MG tablet   HTN (hypertension) - Primary    Poorly controlled will alter medications, encouraged DASH diet, minimize caffeine and obtain adequate sleep. Report concerning symptoms and follow up as directed and as needed      Relevant Medications   metoprolol succinate (TOPROL-XL) 50 MG 24 hr tablet   Leukocytes in urine    Other Visit Diagnoses    Immunization due       Relevant Orders   Flu vaccine HIGH DOSE PF (Fluzone High dose) (Completed)  I have discontinued Tamryn Woodside's LORazepam, afatinib dimaleate, metoprolol succinate, and metoCLOPramide. I am also having her start on metoprolol succinate. Additionally, I am having her maintain her aspirin, HM MULTIVITAMIN ADULT GUMMY, ondansetron, cetirizine, traMADol, oxandrolone, diphenoxylate-atropine, lidocaine-prilocaine, nicotine, predniSONE, allopurinol, omeprazole, mirtazapine, megestrol, [name], traZODone, PARoxetine, and ALPRAZolam.  Meds ordered this encounter  Medications  . metoprolol succinate (TOPROL-XL) 50 MG 24 hr tablet    Sig: Take 1 tablet (50 mg total) by mouth daily. Take with or immediately following a meal.    Dispense:  90 tablet    Refill:  3  . DISCONTD: ALPRAZolam (XANAX) 0.25 MG tablet    Sig: Take 1 tablet (0.25 mg total) by mouth 3 (three) times daily as needed for anxiety.    Dispense:  30 tablet    Refill:  1  . ALPRAZolam (XANAX) 0.25 MG tablet    Sig: Take 1 tablet (0.25 mg total) by mouth 3 (three) times daily as needed for anxiety.    Dispense:  30 tablet    Refill:  1    CMA served as scribe during this visit. History, Physical and Plan performed by medical provider. Documentation and orders reviewed and attested to.  Ann Held, DO

## 2017-11-06 NOTE — Patient Instructions (Signed)

## 2017-11-06 NOTE — Assessment & Plan Note (Signed)
With panic attacks Refill xanax -- if needs more than 1 a day regularly consider adding zoloft

## 2017-11-07 LAB — URINE CULTURE

## 2017-11-10 ENCOUNTER — Encounter: Payer: Self-pay | Admitting: *Deleted

## 2017-11-19 ENCOUNTER — Ambulatory Visit: Payer: Medicare Other

## 2017-11-19 ENCOUNTER — Other Ambulatory Visit: Payer: Medicare Other

## 2017-11-19 ENCOUNTER — Ambulatory Visit: Payer: Medicare Other | Admitting: Hematology & Oncology

## 2017-12-01 ENCOUNTER — Inpatient Hospital Stay: Payer: Medicare Other

## 2017-12-01 ENCOUNTER — Inpatient Hospital Stay (HOSPITAL_BASED_OUTPATIENT_CLINIC_OR_DEPARTMENT_OTHER): Payer: Medicare Other | Admitting: Hematology & Oncology

## 2017-12-01 ENCOUNTER — Other Ambulatory Visit: Payer: Self-pay

## 2017-12-01 ENCOUNTER — Encounter: Payer: Self-pay | Admitting: Hematology & Oncology

## 2017-12-01 VITALS — BP 123/62 | HR 96 | Temp 98.3°F | Resp 18 | Wt 102.0 lb

## 2017-12-01 DIAGNOSIS — Z79899 Other long term (current) drug therapy: Secondary | ICD-10-CM | POA: Diagnosis not present

## 2017-12-01 DIAGNOSIS — F410 Panic disorder [episodic paroxysmal anxiety] without agoraphobia: Secondary | ICD-10-CM

## 2017-12-01 DIAGNOSIS — F411 Generalized anxiety disorder: Secondary | ICD-10-CM

## 2017-12-01 DIAGNOSIS — Z95828 Presence of other vascular implants and grafts: Secondary | ICD-10-CM

## 2017-12-01 DIAGNOSIS — C3431 Malignant neoplasm of lower lobe, right bronchus or lung: Secondary | ICD-10-CM

## 2017-12-01 DIAGNOSIS — K909 Intestinal malabsorption, unspecified: Secondary | ICD-10-CM

## 2017-12-01 DIAGNOSIS — D509 Iron deficiency anemia, unspecified: Secondary | ICD-10-CM | POA: Diagnosis not present

## 2017-12-01 DIAGNOSIS — E43 Unspecified severe protein-calorie malnutrition: Secondary | ICD-10-CM

## 2017-12-01 LAB — CBC WITH DIFFERENTIAL (CANCER CENTER ONLY)
BASOS PCT: 1 %
Basophils Absolute: 0.1 10*3/uL (ref 0.0–0.1)
EOS ABS: 0.3 10*3/uL (ref 0.0–0.5)
EOS PCT: 2 %
HCT: 37.2 % (ref 34.8–46.6)
Hemoglobin: 11.7 g/dL (ref 11.6–15.9)
Lymphocytes Relative: 18 %
Lymphs Abs: 2.4 10*3/uL (ref 0.9–3.3)
MCH: 31.6 pg (ref 26.0–34.0)
MCHC: 31.5 g/dL — AB (ref 32.0–36.0)
MCV: 100.5 fL (ref 81.0–101.0)
MONO ABS: 1.2 10*3/uL — AB (ref 0.1–0.9)
Monocytes Relative: 9 %
NEUTROS ABS: 9.5 10*3/uL — AB (ref 1.5–6.5)
Neutrophils Relative %: 70 %
PLATELETS: 411 10*3/uL — AB (ref 145–400)
RBC: 3.7 MIL/uL (ref 3.70–5.32)
RDW: 13.5 % (ref 11.1–15.7)
WBC Count: 13.5 10*3/uL — ABNORMAL HIGH (ref 3.9–10.0)

## 2017-12-01 LAB — CMP (CANCER CENTER ONLY)
ALBUMIN: 3.3 g/dL — AB (ref 3.5–5.0)
ALT: 20 U/L (ref 10–47)
AST: 24 U/L (ref 11–38)
Alkaline Phosphatase: 74 U/L (ref 26–84)
Anion gap: 0 — ABNORMAL LOW (ref 5–15)
BUN: 48 mg/dL — ABNORMAL HIGH (ref 7–22)
CHLORIDE: 108 mmol/L (ref 98–108)
CO2: 30 mmol/L (ref 18–33)
Calcium: 9.2 mg/dL (ref 8.0–10.3)
Creatinine: 1.7 mg/dL — ABNORMAL HIGH (ref 0.60–1.20)
GLUCOSE: 124 mg/dL — AB (ref 73–118)
Potassium: 4.3 mmol/L (ref 3.3–4.7)
SODIUM: 138 mmol/L (ref 128–145)
Total Bilirubin: 0.4 mg/dL (ref 0.2–1.6)
Total Protein: 6.6 g/dL (ref 6.4–8.1)

## 2017-12-01 LAB — PREALBUMIN: Prealbumin: 30.8 mg/dL (ref 18–38)

## 2017-12-01 LAB — LACTATE DEHYDROGENASE: LDH: 193 U/L — ABNORMAL HIGH (ref 98–192)

## 2017-12-01 MED ORDER — SODIUM CHLORIDE 0.9% FLUSH
10.0000 mL | INTRAVENOUS | Status: AC | PRN
  Administered 2017-12-01: 10 mL via INTRAVENOUS
  Filled 2017-12-01: qty 10

## 2017-12-01 MED ORDER — ALPRAZOLAM 0.25 MG PO TABS: 0.2500 mg | ORAL_TABLET | Freq: Three times a day (TID) | ORAL | 1 refills | Status: DC | PRN

## 2017-12-01 MED ORDER — HEPARIN SOD (PORK) LOCK FLUSH 100 UNIT/ML IV SOLN
500.0000 [IU] | Freq: Once | INTRAVENOUS | Status: AC
  Administered 2017-12-01: 500 [IU] via INTRAVENOUS
  Filled 2017-12-01: qty 5

## 2017-12-01 NOTE — Progress Notes (Signed)
Hematology and Oncology Follow Up Visit  Marissa Cruz [id number] 03-Nov-1934 83 y.o. 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  She now is having some issues with panic attacks.  They seem to be mostly at nighttime.  She went to see her family doctor.  As always, Dr. Etter Sjogren gave her a thorough evaluation.  She put her on Xanax which is helping her quite a bit.   Her appetite is doing okay.  Her weight is holding steady.  I think that her weight is a very good indicator as to how well she is doing.  She is had a little bit of hoarseness with her voice.  She is having no cough.  There is no increased shortness of breath.  There is no diarrhea.  She is had no leg swelling.  She does have macular degeneration so she really cannot see all that well.  She feels that this probably is the main source of her panic attacks I which occur mostly at nighttime because she just cannot see well.  Overall, her performance status is ECOG 3.     Medications:  Allergies as of 12/01/2017      Reactions   Augmentin [amoxicillin-pot Clavulanate] Diarrhea   c diff   Morphine And Related Other (See Comments)   Hallucinations and combative   Codeine Nausea Only   Morphine Other (See Comments)   delusions delusions      Medication List        Accurate as of 12/01/17  2:13 PM. Always use your most recent med list.          allopurinol 100 MG tablet Commonly known as:  ZYLOPRIM TAKE 1 TABLET(100 MG) BY MOUTH AT BEDTIME   ALPRAZolam 0.25  MG tablet Commonly known as:  XANAX Take 1 tablet (0.25 mg total) by mouth 3 (three) times daily as needed for anxiety.   aspirin 81 MG tablet Take 81 mg by mouth daily.   cetirizine 10 MG tablet Commonly known as:  ZYRTEC Take 10 mg by mouth at bedtime.   HM MULTIVITAMIN ADULT GUMMY Chew Chew 2 tablets by mouth every morning.   lidocaine-prilocaine cream Commonly known as:  EMLA APPLY EXTERNALLY TO THE AFFECTED AREA 1 TIME   megestrol 40 MG/ML suspension Commonly known as:  MEGACE TAKE 10 ML BY MOUTH DAILY   metoprolol succinate 50 MG 24 hr tablet Commonly known as:  TOPROL-XL Take 1 tablet (50 mg total) by mouth daily. Take with or immediately following a meal.   mirtazapine 15 MG disintegrating tablet Commonly known as:  REMERON SOL-TAB DISSOLVE 1 TABLET UNDER THE TONGUE DAILY AT BEDTIME   nicotine 7 mg/24hr patch Commonly known as:  NICODERM CQ - dosed in mg/24 hr Place 7 mg onto the skin daily.   omeprazole 20 MG capsule Commonly known as:  PRILOSEC TAKE 1 CAPSULE(20 MG) BY MOUTH TWICE DAILY BEFORE A MEAL   ondansetron 8 MG disintegrating tablet Commonly known as:  ZOFRAN-ODT Take 1 tablet (8 mg total) by mouth every 8 (  eight) hours as needed for nausea or vomiting. Reported on 05/17/2015   oxandrolone 10 MG tablet Commonly known as:  OXANDRIN Take 1 tablet (10 mg total) by mouth daily with breakfast.   PARoxetine 10 MG tablet Commonly known as:  PAXIL TAKE 1 TABLET BY MOUTH DAILY FOR DEPRESSION   [name] 0.625 MG tablet Generic drug:  estrogens (conjugated) TAKE 1 TABLET BY MOUTH EVERY DAY FOR 21 DAYS, DO NOT TAKE FOR 7 DAYS AS DIRECTED   traMADol 50 MG tablet Commonly known as:  ULTRAM Take 1 tablet (50 mg total) by mouth every 6 (six) hours as needed.   traZODone 50 MG tablet Commonly known as:  DESYREL TAKE 1 TABLET(50 MG) BY MOUTH AT BEDTIME. NEED FOLLOW UP       Allergies:  Allergies  Allergen Reactions  . Augmentin [Amoxicillin-Pot  Clavulanate] Diarrhea    c diff  . Morphine And Related Other (See Comments)    Hallucinations and combative  . Codeine Nausea Only  . Morphine Other (See Comments)    delusions delusions    Past Medical History, Surgical history, Social history, and Family History were reviewed and updated.  Review of Systems:  Review of Systems  Constitutional: Negative.   HENT: Negative.   Eyes: Negative.   Respiratory: Negative.   Cardiovascular: Negative.   Gastrointestinal: Negative.   Genitourinary: Negative.   Musculoskeletal: Negative.   Skin: Negative.   Neurological: Negative.   Endo/Heme/Allergies: Negative.   Psychiatric/Behavioral: Negative.       Physical Exam:  weight is 102 lb (46.3 kg). Her oral temperature is 98.3 F (36.8 C). Her blood pressure is 123/62 and her pulse is 96. Her respiration is 18 and oxygen saturation is 98%.   Wt Readings from Last 3 Encounters:  12/01/17 102 lb (46.3 kg)  11/06/17 101 lb 9.6 oz (46.1 kg)  10/30/17 102 lb (46.3 kg)    Physical Exam  Constitutional: She is oriented to person, place, and time.  HENT:  Head: Normocephalic and atraumatic.  Mouth/Throat: Oropharynx is clear and moist.  Eyes: Pupils are equal, round, and reactive to light. EOM are normal.  Neck: Normal range of motion.  Cardiovascular: Normal rate, regular rhythm and normal heart sounds.  Pulmonary/Chest: Effort normal and breath sounds normal.  Abdominal: Soft. Bowel sounds are normal.  Musculoskeletal: Normal range of motion. She exhibits no edema, tenderness or deformity.  Lymphadenopathy:    She has no cervical adenopathy.  Neurological: She is alert and oriented to person, place, and time.  Skin: Skin is warm and dry. No rash noted. No erythema.  Psychiatric: She has a normal mood and affect. Her behavior is normal. Judgment and thought content normal.  Vitals reviewed.    Lab Results  Component Value Date   WBC 13.5 (H) 12/01/2017   HGB 11.7  12/01/2017   HCT 37.2 12/01/2017   MCV 100.5 12/01/2017   PLT 411 (H) 12/01/2017   Lab Results  Component Value Date   FERRITIN 64 10/09/2017   IRON 102 10/09/2017   TIBC 333 10/09/2017   UIBC 230 10/09/2017   IRONPCTSAT 31 10/09/2017   Lab Results  Component Value Date   RBC 3.70 12/01/2017   No results found for: KPAFRELGTCHN, LAMBDASER, KAPLAMBRATIO No results found for: IGGSERUM, IGA, IGMSERUM No results found for: Ronnald Ramp, A1GS, A2GS, Tillman Sers, SPEI   Chemistry      Component Value Date/Time   NA 138 12/01/2017 1256   NA 140 01/30/2017 1138  NA 136 03/07/2016 1055   K 4.3 12/01/2017 1256   K 4.2 01/30/2017 1138   K 4.1 03/07/2016 1055   CL 108 12/01/2017 1256   CL 102 01/30/2017 1138   CO2 30 12/01/2017 1256   CO2 28 01/30/2017 1138   CO2 26 03/07/2016 1055   BUN 48 (H) 12/01/2017 1256   BUN 34 (H) 01/30/2017 1138   BUN 28.7 (H) 03/07/2016 1055   CREATININE 1.70 (H) 12/01/2017 1256   CREATININE 2.7 (H) 01/30/2017 1138   CREATININE 1.3 (H) 03/07/2016 1055      Component Value Date/Time   CALCIUM 9.2 12/01/2017 1256   CALCIUM 9.3 01/30/2017 1138   CALCIUM 9.5 03/07/2016 1055   ALKPHOS 74 12/01/2017 1256   ALKPHOS 59 01/30/2017 1138   ALKPHOS 83 03/07/2016 1055   AST 24 12/01/2017 1256   AST 17 03/07/2016 1055   ALT 20 12/01/2017 1256   ALT 16 01/30/2017 1138   ALT 9 03/07/2016 1055   BILITOT 0.4 12/01/2017 1256   BILITOT 0.26 03/07/2016 1055      Impression and Plan: Marissa Cruz is a very pleasant 83 yo caucasian female with locally advanced, inoperable, stage IIIb squamous cell carcinoma of the right lung.  She had done very well with immunotherapy with Keytruda.  She is not a candidate for chemotherapy.  I do think that it would help to get another PET scan on her.  I will get one at the end of October.  This will give him a very good idea as to how quickly or slowly her tumor is progressing.  This will allow [place] to  make better management decisions with regards to her care.  I do think that we probably will need to get hospice involved.  I think this would be a big help to the family and to Marissa Cruz.  In November, a second great grandchild will be born.  Marissa Cruz is not looking forward to this as there will be a lot of noise in her house.  Volanda Napoleon, MD 9/30/20192:13 PM

## 2017-12-01 NOTE — Addendum Note (Signed)
Addended by: [place] on: 12/01/2017 02:44 PM   Modules accepted: Orders, SmartSet

## 2017-12-02 LAB — IRON AND TIBC
Iron: 99 ug/dL (ref 41–142)
Saturation Ratios: 27 % (ref 21–57)
TIBC: 369 ug/dL (ref 236–444)
UIBC: 269 ug/dL

## 2017-12-02 LAB — FERRITIN: Ferritin: 40 ng/mL (ref 11–307)

## 2017-12-15 ENCOUNTER — Other Ambulatory Visit: Payer: Self-pay | Admitting: Hematology & Oncology

## 2017-12-15 DIAGNOSIS — C3431 Malignant neoplasm of lower lobe, right bronchus or lung: Secondary | ICD-10-CM

## 2017-12-31 ENCOUNTER — Ambulatory Visit (HOSPITAL_COMMUNITY): Payer: Medicare Other

## 2018-01-01 ENCOUNTER — Ambulatory Visit (HOSPITAL_COMMUNITY)
Admission: RE | Admit: 2018-01-01 | Discharge: 2018-01-01 | Disposition: A | Discharge status: Home / Self Care | Payer: Medicare Other | Source: Ambulatory Visit | Attending: Hematology & Oncology | Admitting: Hematology & Oncology

## 2018-01-01 DIAGNOSIS — F411 Generalized anxiety disorder: Secondary | ICD-10-CM | POA: Insufficient documentation

## 2018-01-01 DIAGNOSIS — C3490 Malignant neoplasm of unspecified part of unspecified bronchus or lung: Secondary | ICD-10-CM | POA: Diagnosis not present

## 2018-01-01 LAB — GLUCOSE, CAPILLARY: GLUCOSE-CAPILLARY: 84 mg/dL (ref 70–99)

## 2018-01-01 MED ORDER — FLUDEOXYGLUCOSE F - 18 (FDG) INJECTION
5.1000 | Freq: Once | INTRAVENOUS | Status: AC
  Administered 2018-01-01: 5.1 via INTRAVENOUS

## 2018-01-02 ENCOUNTER — Telehealth: Payer: Self-pay | Admitting: *Deleted

## 2018-01-02 NOTE — Telephone Encounter (Signed)
Unable to reach pt at home #, no vm available.Called spoke with Rosary Lively, gave PET results. Erline Levine advised she would share these results with Pt. No further concerns.

## 2018-01-02 NOTE — Telephone Encounter (Signed)
-----   Message from Volanda Napoleon, MD sent at 01/01/2018  4:33 PM EDT ----- Call - the PET scan shows that nodes in the chest are better.  In the abdomen, some nodes are better others might a little more active.  Overall, I think the PET scan looks stable!!  Pete

## 2018-01-04 ENCOUNTER — Other Ambulatory Visit: Payer: Self-pay | Admitting: Family Medicine

## 2018-01-06 DIAGNOSIS — H353232 Exudative age-related macular degeneration, bilateral, with inactive choroidal neovascularization: Secondary | ICD-10-CM | POA: Diagnosis not present

## 2018-01-06 DIAGNOSIS — H43813 Vitreous degeneration, bilateral: Secondary | ICD-10-CM | POA: Diagnosis not present

## 2018-01-08 ENCOUNTER — Encounter: Payer: Self-pay | Admitting: Hematology & Oncology

## 2018-01-08 ENCOUNTER — Inpatient Hospital Stay: Payer: Medicare Other | Attending: Hematology & Oncology | Admitting: Hematology & Oncology

## 2018-01-08 ENCOUNTER — Inpatient Hospital Stay: Payer: Medicare Other

## 2018-01-08 ENCOUNTER — Other Ambulatory Visit: Payer: Self-pay

## 2018-01-08 ENCOUNTER — Other Ambulatory Visit: Payer: Self-pay | Admitting: Oncology

## 2018-01-08 ENCOUNTER — Telehealth: Payer: Self-pay | Admitting: *Deleted

## 2018-01-08 VITALS — BP 188/72 | HR 92 | Temp 98.6°F | Resp 18 | Wt 103.5 lb

## 2018-01-08 DIAGNOSIS — C3431 Malignant neoplasm of lower lobe, right bronchus or lung: Secondary | ICD-10-CM

## 2018-01-08 DIAGNOSIS — D509 Iron deficiency anemia, unspecified: Secondary | ICD-10-CM | POA: Insufficient documentation

## 2018-01-08 DIAGNOSIS — R918 Other nonspecific abnormal finding of lung field: Secondary | ICD-10-CM

## 2018-01-08 DIAGNOSIS — R599 Enlarged lymph nodes, unspecified: Secondary | ICD-10-CM | POA: Diagnosis not present

## 2018-01-08 DIAGNOSIS — D500 Iron deficiency anemia secondary to blood loss (chronic): Secondary | ICD-10-CM

## 2018-01-08 DIAGNOSIS — I10 Essential (primary) hypertension: Secondary | ICD-10-CM

## 2018-01-08 DIAGNOSIS — Z79899 Other long term (current) drug therapy: Secondary | ICD-10-CM | POA: Diagnosis not present

## 2018-01-08 DIAGNOSIS — F411 Generalized anxiety disorder: Secondary | ICD-10-CM

## 2018-01-08 LAB — CBC WITH DIFFERENTIAL (CANCER CENTER ONLY)
Abs Immature Granulocytes: 0.19 10*3/uL — ABNORMAL HIGH (ref 0.00–0.07)
BASOS ABS: 0.1 10*3/uL (ref 0.0–0.1)
Basophils Relative: 1 %
EOS ABS: 0.4 10*3/uL (ref 0.0–0.5)
EOS PCT: 3 %
HCT: 39.3 % (ref 36.0–46.0)
Hemoglobin: 12.2 g/dL (ref 12.0–15.0)
Immature Granulocytes: 1 %
LYMPHS PCT: 17 %
Lymphs Abs: 2.5 10*3/uL (ref 0.7–4.0)
MCH: 30.5 pg (ref 26.0–34.0)
MCHC: 31 g/dL (ref 30.0–36.0)
MCV: 98.3 fL (ref 80.0–100.0)
Monocytes Absolute: 1.3 10*3/uL — ABNORMAL HIGH (ref 0.1–1.0)
Monocytes Relative: 9 %
NRBC: 0 % (ref 0.0–0.2)
Neutro Abs: 10 10*3/uL — ABNORMAL HIGH (ref 1.7–7.7)
Neutrophils Relative %: 69 %
Platelet Count: 394 10*3/uL (ref 150–400)
RBC: 4 MIL/uL (ref 3.87–5.11)
RDW: 13.5 % (ref 11.5–15.5)
WBC: 14.5 10*3/uL — AB (ref 4.0–10.5)

## 2018-01-08 LAB — CMP (CANCER CENTER ONLY)
ALBUMIN: 3.8 g/dL (ref 3.5–5.0)
ALT: 20 U/L (ref 10–47)
AST: 28 U/L (ref 11–38)
Alkaline Phosphatase: 78 U/L (ref 26–84)
Anion gap: 7 (ref 5–15)
BUN: 53 mg/dL — AB (ref 7–22)
CALCIUM: 9.6 mg/dL (ref 8.0–10.3)
CHLORIDE: 107 mmol/L (ref 98–108)
CO2: 31 mmol/L (ref 18–33)
CREATININE: 1.9 mg/dL — AB (ref 0.60–1.20)
Glucose, Bld: 82 mg/dL (ref 73–118)
POTASSIUM: 4.6 mmol/L (ref 3.3–4.7)
SODIUM: 145 mmol/L (ref 128–145)
TOTAL PROTEIN: 7.3 g/dL (ref 6.4–8.1)
Total Bilirubin: 0.4 mg/dL (ref 0.2–1.6)

## 2018-01-08 MED ORDER — METOPROLOL SUCCINATE ER 100 MG PO TB24: 100.0000 mg | ORAL_TABLET | Freq: Every day | ORAL | 5 refills | Status: AC

## 2018-01-08 MED ORDER — HEPARIN SOD (PORK) LOCK FLUSH 100 UNIT/ML IV SOLN
500.0000 [IU] | Freq: Once | INTRAVENOUS | Status: AC | PRN
  Administered 2018-01-08: 500 [IU] via INTRAVENOUS
  Filled 2018-01-08: qty 5

## 2018-01-08 MED ORDER — LIDOCAINE-PRILOCAINE 2.5-2.5 % EX CREA: TOPICAL_CREAM | CUTANEOUS | 3 refills | Status: AC

## 2018-01-08 MED ORDER — SODIUM CHLORIDE 0.9% FLUSH
10.0000 mL | INTRAVENOUS | Status: DC | PRN
  Administered 2018-01-08: 10 mL via INTRAVENOUS
  Filled 2018-01-08: qty 10

## 2018-01-08 NOTE — Telephone Encounter (Signed)
Spoke with Mechele Claude at [facility] and gave order for Hospice Referral. They will reach out to patient.

## 2018-01-08 NOTE — Progress Notes (Signed)
Hematology and Oncology Follow Up Visit  Marissa Cruz [id number] [birth year] 83 y.o. 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  She seems to be holding her own.  She really has not had any new problems since we last saw her.  Her blood pressure was quite high today.  I will increase her metoprolol up to 100 mg a day.  We did do a PET scan on her.  This was done on October 31.  The PET scan essentially showed stable disease.  She had some improvement in a right paratracheal node and right retroperitoneal lymph node.  There was a new subcentimeter aortocaval lymph node.  She has had a little bit of weakness.  It is hard to to say what this really signifies.  She still has the macular degeneration.  She is had no leg swelling.  Overall, her performance status is ECOG 3.     Medications:  Allergies as of 01/08/2018      Reactions   Augmentin [amoxicillin-pot Clavulanate] Diarrhea   c diff   Morphine And Related Other (See Comments)   Hallucinations and combative   Codeine Nausea Only   Morphine Other (See Comments)   delusions delusions      Medication List        Accurate as of 01/08/18  1:57 PM. Always use your most recent med list.          allopurinol 100 MG tablet Commonly known as:  ZYLOPRIM TAKE 1 TABLET(100 MG) BY MOUTH AT BEDTIME   ALPRAZolam 0.25 MG tablet Commonly known as:  XANAX Take 1 tablet (0.25 mg total) by mouth 3 (three) times daily as needed for anxiety.   aspirin 81 MG tablet Take 81 mg  by mouth daily.   cetirizine 10 MG tablet Commonly known as:  ZYRTEC Take 10 mg by mouth at bedtime.   HM MULTIVITAMIN ADULT GUMMY Chew Chew 2 tablets by mouth every morning.   lidocaine-prilocaine cream Commonly known as:  EMLA APPLY EXTERNALLY TO THE AFFECTED AREA 1 TIME   megestrol 40 MG/ML suspension Commonly known as:  MEGACE TAKE 10 ML BY MOUTH DAILY   metoprolol succinate 50 MG 24 hr tablet Commonly known as:  TOPROL-XL Take 1 tablet (50 mg total) by mouth daily. Take with or immediately following a meal.   mirtazapine 15 MG disintegrating tablet Commonly known as:  REMERON SOL-TAB DISSOLVE 1 TABLET UNDER THE TONGUE DAILY AT BEDTIME   nicotine 7 mg/24hr patch Commonly known as:  NICODERM CQ - dosed in mg/24 hr Place 7 mg onto the skin daily.   omeprazole 20 MG capsule Commonly known as:  PRILOSEC TAKE 1 CAPSULE(20 MG) BY MOUTH TWICE DAILY BEFORE A MEAL   ondansetron 8 MG disintegrating tablet Commonly known as:  ZOFRAN-ODT Take 1 tablet (8 mg total) by mouth every 8 (eight) hours as needed for nausea or vomiting. Reported on 05/17/2015   oxandrolone 10 MG tablet Commonly known as:  OXANDRIN Take 1 tablet (10 mg total) by mouth daily with breakfast.  PARoxetine 10 MG tablet Commonly known as:  PAXIL TAKE 1 TABLET BY MOUTH DAILY FOR DEPRESSION   [name] 0.625 MG tablet Generic drug:  estrogens (conjugated) TAKE 1 TABLET BY MOUTH EVERY DAY FOR 21 DAYS, DO NOT TAKE FOR 7 DAYS AS DIRECTED   traMADol 50 MG tablet Commonly known as:  ULTRAM Take 1 tablet (50 mg total) by mouth every 6 (six) hours as needed.   traZODone 50 MG tablet Commonly known as:  DESYREL TAKE 1 TABLET(50 MG) BY MOUTH AT BEDTIME. NEED FOLLOW UP       Allergies:  Allergies  Allergen Reactions  . Augmentin [Amoxicillin-Pot Clavulanate] Diarrhea    c diff  . Morphine And Related Other (See Comments)    Hallucinations and combative  . Codeine Nausea Only  . Morphine Other (See  Comments)    delusions delusions    Past Medical History, Surgical history, Social history, and Family History were reviewed and updated.  Review of Systems:  Review of Systems  Constitutional: Negative.   HENT: Negative.   Eyes: Negative.   Respiratory: Negative.   Cardiovascular: Negative.   Gastrointestinal: Negative.   Genitourinary: Negative.   Musculoskeletal: Negative.   Skin: Negative.   Neurological: Negative.   Endo/Heme/Allergies: Negative.   Psychiatric/Behavioral: Negative.       Physical Exam:  weight is 103 lb 8 oz (46.9 kg). Her oral temperature is 98.6 F (37 C). Her blood pressure is 188/72 (abnormal) and her pulse is 92. Her respiration is 18 and oxygen saturation is 95%.   Wt Readings from Last 3 Encounters:  01/08/18 103 lb 8 oz (46.9 kg)  12/01/17 102 lb (46.3 kg)  11/06/17 101 lb 9.6 oz (46.1 kg)    Physical Exam  Constitutional: She is oriented to person, place, and time.  HENT:  Head: Normocephalic and atraumatic.  Mouth/Throat: Oropharynx is clear and moist.  Eyes: Pupils are equal, round, and reactive to light. EOM are normal.  Neck: Normal range of motion.  Cardiovascular: Normal rate, regular rhythm and normal heart sounds.  Pulmonary/Chest: Effort normal and breath sounds normal.  Abdominal: Soft. Bowel sounds are normal.  Musculoskeletal: Normal range of motion. She exhibits no edema, tenderness or deformity.  Lymphadenopathy:    She has no cervical adenopathy.  Neurological: She is alert and oriented to person, place, and time.  Skin: Skin is warm and dry. No rash noted. No erythema.  Psychiatric: She has a normal mood and affect. Her behavior is normal. Judgment and thought content normal.  Vitals reviewed.    Lab Results  Component Value Date   WBC 14.5 (H) 01/08/2018   HGB 12.2 01/08/2018   HCT 39.3 01/08/2018   MCV 98.3 01/08/2018   PLT 394 01/08/2018   Lab Results  Component Value Date   FERRITIN 40 12/01/2017    IRON 99 12/01/2017   TIBC 369 12/01/2017   UIBC 269 12/01/2017   IRONPCTSAT 27 12/01/2017   Lab Results  Component Value Date   RBC 4.00 01/08/2018   No results found for: KPAFRELGTCHN, LAMBDASER, KAPLAMBRATIO No results found for: IGGSERUM, IGA, IGMSERUM No results found for: Odetta Pink, SPEI   Chemistry      Component Value Date/Time   NA 138 12/01/2017 1256   NA 140 01/30/2017 1138   NA 136 03/07/2016 1055   K 4.3 12/01/2017 1256   K 4.2 01/30/2017 1138   K 4.1 03/07/2016 1055   CL 108 12/01/2017 1256  CL 102 01/30/2017 1138   CO2 30 12/01/2017 1256   CO2 28 01/30/2017 1138   CO2 26 03/07/2016 1055   BUN 48 (H) 12/01/2017 1256   BUN 34 (H) 01/30/2017 1138   BUN 28.7 (H) 03/07/2016 1055   CREATININE 1.70 (H) 12/01/2017 1256   CREATININE 2.7 (H) 01/30/2017 1138   CREATININE 1.3 (H) 03/07/2016 1055      Component Value Date/Time   CALCIUM 9.2 12/01/2017 1256   CALCIUM 9.3 01/30/2017 1138   CALCIUM 9.5 03/07/2016 1055   ALKPHOS 74 12/01/2017 1256   ALKPHOS 59 01/30/2017 1138   ALKPHOS 83 03/07/2016 1055   AST 24 12/01/2017 1256   AST 17 03/07/2016 1055   ALT 20 12/01/2017 1256   ALT 16 01/30/2017 1138   ALT 9 03/07/2016 1055   BILITOT 0.4 12/01/2017 1256   BILITOT 0.26 03/07/2016 1055      Impression and Plan: Marissa Cruz is a very pleasant 83 yo caucasian female with locally advanced, inoperable, stage IIIb squamous cell carcinoma of the right lung.  She had done very well with immunotherapy with Keytruda.  She is not a candidate for chemotherapy.  Overall, I really believe that her disease is relatively stable right now.  I think the way for [place] to tell is that she will start to lose weight and become much more weak.  She never does have much of a good appetite.  So might be hard to tell how the appetite will progress.  I did talk to them about hospice.  I explained how hospice works.  I explained how  hospice could make things better for them.  It does not cost anything to get enrolled into the hospice program.  They agreed with hospice.  We will get [facility] involved.    We will plan to get her back in another 4 weeks or so.  I am not sure about another PET scan on her.  If we do one, he probably will not be for another 2 or 3 months.  Her quality of life really is what we are interested in.  This is our major focus.   Volanda Napoleon, MD 11/7/20191:57 PM

## 2018-01-09 LAB — IRON AND TIBC
Iron: 68 ug/dL (ref 41–142)
SATURATION RATIOS: 17 % — AB (ref 21–57)
TIBC: 394 ug/dL (ref 236–444)
UIBC: 326 ug/dL (ref 120–384)

## 2018-01-09 LAB — FERRITIN: Ferritin: 39 ng/mL (ref 11–307)

## 2018-01-12 DIAGNOSIS — Z8521 Personal history of malignant neoplasm of larynx: Secondary | ICD-10-CM | POA: Diagnosis not present

## 2018-01-12 DIAGNOSIS — Z87891 Personal history of nicotine dependence: Secondary | ICD-10-CM | POA: Diagnosis not present

## 2018-01-12 DIAGNOSIS — R64 Cachexia: Secondary | ICD-10-CM | POA: Diagnosis not present

## 2018-01-12 DIAGNOSIS — I129 Hypertensive chronic kidney disease with stage 1 through stage 4 chronic kidney disease, or unspecified chronic kidney disease: Secondary | ICD-10-CM | POA: Diagnosis not present

## 2018-01-12 DIAGNOSIS — N184 Chronic kidney disease, stage 4 (severe): Secondary | ICD-10-CM | POA: Diagnosis not present

## 2018-01-12 DIAGNOSIS — D649 Anemia, unspecified: Secondary | ICD-10-CM | POA: Diagnosis not present

## 2018-01-12 DIAGNOSIS — C3431 Malignant neoplasm of lower lobe, right bronchus or lung: Secondary | ICD-10-CM | POA: Diagnosis not present

## 2018-01-12 DIAGNOSIS — Z452 Encounter for adjustment and management of vascular access device: Secondary | ICD-10-CM | POA: Diagnosis not present

## 2018-01-12 DIAGNOSIS — Z85819 Personal history of malignant neoplasm of unspecified site of lip, oral cavity, and pharynx: Secondary | ICD-10-CM | POA: Diagnosis not present

## 2018-01-14 ENCOUNTER — Other Ambulatory Visit: Payer: Self-pay | Admitting: Family Medicine

## 2018-01-14 DIAGNOSIS — Z452 Encounter for adjustment and management of vascular access device: Secondary | ICD-10-CM | POA: Diagnosis not present

## 2018-01-14 DIAGNOSIS — C3431 Malignant neoplasm of lower lobe, right bronchus or lung: Secondary | ICD-10-CM | POA: Diagnosis not present

## 2018-01-14 DIAGNOSIS — N184 Chronic kidney disease, stage 4 (severe): Secondary | ICD-10-CM | POA: Diagnosis not present

## 2018-01-14 DIAGNOSIS — D649 Anemia, unspecified: Secondary | ICD-10-CM | POA: Diagnosis not present

## 2018-01-14 DIAGNOSIS — I129 Hypertensive chronic kidney disease with stage 1 through stage 4 chronic kidney disease, or unspecified chronic kidney disease: Secondary | ICD-10-CM | POA: Diagnosis not present

## 2018-01-14 DIAGNOSIS — R64 Cachexia: Secondary | ICD-10-CM | POA: Diagnosis not present

## 2018-01-15 ENCOUNTER — Inpatient Hospital Stay: Payer: Medicare Other

## 2018-01-15 VITALS — BP 198/74 | HR 78 | Temp 98.1°F | Resp 20

## 2018-01-15 DIAGNOSIS — I129 Hypertensive chronic kidney disease with stage 1 through stage 4 chronic kidney disease, or unspecified chronic kidney disease: Secondary | ICD-10-CM | POA: Diagnosis not present

## 2018-01-15 DIAGNOSIS — D509 Iron deficiency anemia, unspecified: Secondary | ICD-10-CM | POA: Diagnosis not present

## 2018-01-15 DIAGNOSIS — Z79899 Other long term (current) drug therapy: Secondary | ICD-10-CM | POA: Diagnosis not present

## 2018-01-15 DIAGNOSIS — D649 Anemia, unspecified: Secondary | ICD-10-CM | POA: Diagnosis not present

## 2018-01-15 DIAGNOSIS — R64 Cachexia: Secondary | ICD-10-CM | POA: Diagnosis not present

## 2018-01-15 DIAGNOSIS — D500 Iron deficiency anemia secondary to blood loss (chronic): Secondary | ICD-10-CM

## 2018-01-15 DIAGNOSIS — C3431 Malignant neoplasm of lower lobe, right bronchus or lung: Secondary | ICD-10-CM | POA: Diagnosis not present

## 2018-01-15 DIAGNOSIS — N184 Chronic kidney disease, stage 4 (severe): Secondary | ICD-10-CM | POA: Diagnosis not present

## 2018-01-15 DIAGNOSIS — Z452 Encounter for adjustment and management of vascular access device: Secondary | ICD-10-CM | POA: Diagnosis not present

## 2018-01-15 MED ORDER — SODIUM CHLORIDE 0.9 % IV SOLN
510.0000 mg | Freq: Once | INTRAVENOUS | Status: AC
  Administered 2018-01-15: 510 mg via INTRAVENOUS
  Filled 2018-01-15: qty 17

## 2018-01-15 MED ORDER — SODIUM CHLORIDE 0.9% FLUSH
10.0000 mL | INTRAVENOUS | Status: DC | PRN
  Administered 2018-01-15: 10 mL via INTRAVENOUS
  Filled 2018-01-15: qty 10

## 2018-01-15 MED ORDER — HEPARIN SOD (PORK) LOCK FLUSH 100 UNIT/ML IV SOLN
500.0000 [IU] | Freq: Once | INTRAVENOUS | Status: AC
  Administered 2018-01-15: 500 [IU] via INTRAVENOUS
  Filled 2018-01-15: qty 5

## 2018-01-15 MED ORDER — SODIUM CHLORIDE 0.9 % IV SOLN
INTRAVENOUS | Status: DC
  Administered 2018-01-15: 15:00:00 via INTRAVENOUS
  Filled 2018-01-15: qty 250

## 2018-01-15 NOTE — Patient Instructions (Signed)

## 2018-01-18 ENCOUNTER — Other Ambulatory Visit: Payer: Self-pay | Admitting: Family Medicine

## 2018-01-19 DIAGNOSIS — Z452 Encounter for adjustment and management of vascular access device: Secondary | ICD-10-CM | POA: Diagnosis not present

## 2018-01-19 DIAGNOSIS — D649 Anemia, unspecified: Secondary | ICD-10-CM | POA: Diagnosis not present

## 2018-01-19 DIAGNOSIS — I129 Hypertensive chronic kidney disease with stage 1 through stage 4 chronic kidney disease, or unspecified chronic kidney disease: Secondary | ICD-10-CM | POA: Diagnosis not present

## 2018-01-19 DIAGNOSIS — N184 Chronic kidney disease, stage 4 (severe): Secondary | ICD-10-CM | POA: Diagnosis not present

## 2018-01-19 DIAGNOSIS — C3431 Malignant neoplasm of lower lobe, right bronchus or lung: Secondary | ICD-10-CM | POA: Diagnosis not present

## 2018-01-19 DIAGNOSIS — R64 Cachexia: Secondary | ICD-10-CM | POA: Diagnosis not present

## 2018-01-26 DIAGNOSIS — C3431 Malignant neoplasm of lower lobe, right bronchus or lung: Secondary | ICD-10-CM | POA: Diagnosis not present

## 2018-01-26 DIAGNOSIS — N184 Chronic kidney disease, stage 4 (severe): Secondary | ICD-10-CM | POA: Diagnosis not present

## 2018-01-26 DIAGNOSIS — D649 Anemia, unspecified: Secondary | ICD-10-CM | POA: Diagnosis not present

## 2018-01-26 DIAGNOSIS — I129 Hypertensive chronic kidney disease with stage 1 through stage 4 chronic kidney disease, or unspecified chronic kidney disease: Secondary | ICD-10-CM | POA: Diagnosis not present

## 2018-01-26 DIAGNOSIS — Z452 Encounter for adjustment and management of vascular access device: Secondary | ICD-10-CM | POA: Diagnosis not present

## 2018-01-26 DIAGNOSIS — R64 Cachexia: Secondary | ICD-10-CM | POA: Diagnosis not present

## 2018-02-01 DIAGNOSIS — Z85819 Personal history of malignant neoplasm of unspecified site of lip, oral cavity, and pharynx: Secondary | ICD-10-CM | POA: Diagnosis not present

## 2018-02-01 DIAGNOSIS — R64 Cachexia: Secondary | ICD-10-CM | POA: Diagnosis not present

## 2018-02-01 DIAGNOSIS — Z8521 Personal history of malignant neoplasm of larynx: Secondary | ICD-10-CM | POA: Diagnosis not present

## 2018-02-01 DIAGNOSIS — D649 Anemia, unspecified: Secondary | ICD-10-CM | POA: Diagnosis not present

## 2018-02-01 DIAGNOSIS — Z452 Encounter for adjustment and management of vascular access device: Secondary | ICD-10-CM | POA: Diagnosis not present

## 2018-02-01 DIAGNOSIS — C3431 Malignant neoplasm of lower lobe, right bronchus or lung: Secondary | ICD-10-CM | POA: Diagnosis not present

## 2018-02-01 DIAGNOSIS — Z87891 Personal history of nicotine dependence: Secondary | ICD-10-CM | POA: Diagnosis not present

## 2018-02-01 DIAGNOSIS — N184 Chronic kidney disease, stage 4 (severe): Secondary | ICD-10-CM | POA: Diagnosis not present

## 2018-02-01 DIAGNOSIS — I129 Hypertensive chronic kidney disease with stage 1 through stage 4 chronic kidney disease, or unspecified chronic kidney disease: Secondary | ICD-10-CM | POA: Diagnosis not present

## 2018-02-03 DIAGNOSIS — R64 Cachexia: Secondary | ICD-10-CM | POA: Diagnosis not present

## 2018-02-03 DIAGNOSIS — C3431 Malignant neoplasm of lower lobe, right bronchus or lung: Secondary | ICD-10-CM | POA: Diagnosis not present

## 2018-02-03 DIAGNOSIS — D649 Anemia, unspecified: Secondary | ICD-10-CM | POA: Diagnosis not present

## 2018-02-03 DIAGNOSIS — N184 Chronic kidney disease, stage 4 (severe): Secondary | ICD-10-CM | POA: Diagnosis not present

## 2018-02-03 DIAGNOSIS — I129 Hypertensive chronic kidney disease with stage 1 through stage 4 chronic kidney disease, or unspecified chronic kidney disease: Secondary | ICD-10-CM | POA: Diagnosis not present

## 2018-02-03 DIAGNOSIS — Z452 Encounter for adjustment and management of vascular access device: Secondary | ICD-10-CM | POA: Diagnosis not present

## 2018-02-09 ENCOUNTER — Ambulatory Visit: Payer: Medicare Other | Admitting: Family Medicine

## 2018-02-09 DIAGNOSIS — C3431 Malignant neoplasm of lower lobe, right bronchus or lung: Secondary | ICD-10-CM | POA: Diagnosis not present

## 2018-02-09 DIAGNOSIS — Z452 Encounter for adjustment and management of vascular access device: Secondary | ICD-10-CM | POA: Diagnosis not present

## 2018-02-09 DIAGNOSIS — N184 Chronic kidney disease, stage 4 (severe): Secondary | ICD-10-CM | POA: Diagnosis not present

## 2018-02-09 DIAGNOSIS — I129 Hypertensive chronic kidney disease with stage 1 through stage 4 chronic kidney disease, or unspecified chronic kidney disease: Secondary | ICD-10-CM | POA: Diagnosis not present

## 2018-02-09 DIAGNOSIS — R64 Cachexia: Secondary | ICD-10-CM | POA: Diagnosis not present

## 2018-02-09 DIAGNOSIS — D649 Anemia, unspecified: Secondary | ICD-10-CM | POA: Diagnosis not present

## 2018-02-12 ENCOUNTER — Inpatient Hospital Stay

## 2018-02-12 ENCOUNTER — Inpatient Hospital Stay: Attending: Hematology & Oncology | Admitting: Hematology & Oncology

## 2018-02-12 ENCOUNTER — Encounter: Payer: Self-pay | Admitting: Hematology & Oncology

## 2018-02-12 ENCOUNTER — Other Ambulatory Visit: Payer: Self-pay

## 2018-02-12 ENCOUNTER — Telehealth: Payer: Self-pay | Admitting: Hematology & Oncology

## 2018-02-12 VITALS — BP 157/75 | HR 80 | Temp 98.3°F | Resp 18 | Wt 102.5 lb

## 2018-02-12 DIAGNOSIS — K909 Intestinal malabsorption, unspecified: Secondary | ICD-10-CM | POA: Insufficient documentation

## 2018-02-12 DIAGNOSIS — D509 Iron deficiency anemia, unspecified: Secondary | ICD-10-CM | POA: Diagnosis not present

## 2018-02-12 DIAGNOSIS — C3431 Malignant neoplasm of lower lobe, right bronchus or lung: Secondary | ICD-10-CM | POA: Insufficient documentation

## 2018-02-12 DIAGNOSIS — D500 Iron deficiency anemia secondary to blood loss (chronic): Secondary | ICD-10-CM

## 2018-02-12 DIAGNOSIS — Z95828 Presence of other vascular implants and grafts: Secondary | ICD-10-CM

## 2018-02-12 LAB — CMP (CANCER CENTER ONLY)
ALT: 34 U/L (ref 0–44)
AST: 23 U/L (ref 15–41)
Albumin: 3.8 g/dL (ref 3.5–5.0)
Alkaline Phosphatase: 62 U/L (ref 38–126)
Anion gap: 8 (ref 5–15)
BUN: 72 mg/dL — ABNORMAL HIGH (ref 8–23)
CO2: 29 mmol/L (ref 22–32)
Calcium: 9.3 mg/dL (ref 8.9–10.3)
Chloride: 102 mmol/L (ref 98–111)
Creatinine: 1.92 mg/dL — ABNORMAL HIGH (ref 0.44–1.00)
GFR, Est AFR Am: 28 mL/min — ABNORMAL LOW (ref >60)
GFR, Estimated: 24 mL/min — ABNORMAL LOW (ref >60)
GLUCOSE: 122 mg/dL — AB (ref 70–99)
Potassium: 4.5 mmol/L (ref 3.5–5.1)
Sodium: 139 mmol/L (ref 135–145)
TOTAL PROTEIN: 6.5 g/dL (ref 6.5–8.1)
Total Bilirubin: 0.3 mg/dL (ref 0.3–1.2)

## 2018-02-12 LAB — CBC WITH DIFFERENTIAL (CANCER CENTER ONLY)
Abs Immature Granulocytes: 0.82 10*3/uL — ABNORMAL HIGH (ref 0.00–0.07)
BASOS PCT: 0 %
Basophils Absolute: 0 10*3/uL (ref 0.0–0.1)
Eosinophils Absolute: 0 10*3/uL (ref 0.0–0.5)
Eosinophils Relative: 0 %
HCT: 40.6 % (ref 36.0–46.0)
HEMOGLOBIN: 12.6 g/dL (ref 12.0–15.0)
Immature Granulocytes: 4 %
LYMPHS ABS: 0.8 10*3/uL (ref 0.7–4.0)
LYMPHS PCT: 4 %
MCH: 30.7 pg (ref 26.0–34.0)
MCHC: 31 g/dL (ref 30.0–36.0)
MCV: 99 fL (ref 80.0–100.0)
Monocytes Absolute: 0.3 10*3/uL (ref 0.1–1.0)
Monocytes Relative: 1 %
NEUTROS ABS: 17.4 10*3/uL — AB (ref 1.7–7.7)
NRBC: 0 % (ref 0.0–0.2)
Neutrophils Relative %: 91 %
Platelet Count: 424 10*3/uL — ABNORMAL HIGH (ref 150–400)
RBC: 4.1 MIL/uL (ref 3.87–5.11)
RDW: 14.6 % (ref 11.5–15.5)
WBC: 19.3 10*3/uL — AB (ref 4.0–10.5)

## 2018-02-12 MED ORDER — HEPARIN SOD (PORK) LOCK FLUSH 100 UNIT/ML IV SOLN
500.0000 [IU] | Freq: Once | INTRAVENOUS | Status: AC
  Administered 2018-02-12: 500 [IU] via INTRAVENOUS
  Filled 2018-02-12: qty 5

## 2018-02-12 MED ORDER — TELMISARTAN 20 MG PO TABS: 20.0000 mg | ORAL_TABLET | Freq: Every day | ORAL | 5 refills | Status: AC

## 2018-02-12 MED ORDER — SODIUM CHLORIDE 0.9% FLUSH
10.0000 mL | INTRAVENOUS | Status: DC | PRN
  Administered 2018-02-12: 10 mL via INTRAVENOUS
  Filled 2018-02-12: qty 10

## 2018-02-12 NOTE — Telephone Encounter (Signed)
Appointments scheduled [facility]/[place] printed per 12/12 los

## 2018-02-12 NOTE — Progress Notes (Signed)
Hematology and Oncology Follow Up Visit  Marissa Cruz [id number] 09-10-1934 83 y.o. 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  It is amazing how well she is doing.  She really has been off therapy now for 1 year.  She stopped the pembrolizumab back in November 2018.  She now is on hospice.  Hospice is doing a good job with her.  She had a nice Thanksgiving.  A great grandson was born.  This made her happy.  She was able to hold him.  She is having problems with her blood pressure.  We increased her metoprolol up to 100 mg.  I will go ahead and add Micardis (20 mg po q day) to the metoprolol and the see if we cannot get her blood pressure down.  She is actually eating okay.  Her weight is holding steady.  I told her that if her cancer begins to grow, the thing that I would notice would be that her weight will start going down.   She is not having any pain.  Overall, her performance status is ECOG 3.     Medications:  Allergies as of 02/12/2018      Reactions   Augmentin [amoxicillin-pot Clavulanate] Diarrhea   c diff   Morphine And Related Other (See Comments)   Hallucinations and combative   Codeine Nausea Only   Morphine Other (See Comments)   delusions delusions      Medication List       Accurate as of February 12, 2018  2:26 PM. Always use your most recent med list.        allopurinol 100 MG tablet Commonly known as:  ZYLOPRIM TAKE 1 TABLET(100 MG) BY MOUTH AT BEDTIME   ALPRAZolam 0.25 MG  tablet Commonly known as:  XANAX Take 1 tablet (0.25 mg total) by mouth 3 (three) times daily as needed for anxiety.   aspirin 81 MG tablet Take 81 mg by mouth daily.   cetirizine 10 MG tablet Commonly known as:  ZYRTEC Take 10 mg by mouth at bedtime.   HM MULTIVITAMIN ADULT GUMMY Chew Chew 2 tablets by mouth every morning.   lidocaine-prilocaine cream Commonly known as:  EMLA APPLY EXTERNALLY TO THE AFFECTED AREA 1 TIME   megestrol 40 MG/ML suspension Commonly known as:  MEGACE TAKE 10 ML BY MOUTH DAILY   metoprolol succinate 100 MG 24 hr tablet Commonly known as:  TOPROL-XL Take 1 tablet (100 mg total) by mouth daily. Take with or immediately following a meal.   metoprolol succinate 25 MG 24 hr tablet Commonly known as:  TOPROL-XL TAKE 1 TABLET BY MOUTH EVERY MORNING* NEEDS OFFICE VISIT BEFORE ANY MORE REFILLS   mirtazapine 15 MG disintegrating tablet Commonly known as:  REMERON SOL-TAB DISSOLVE 1 TABLET UNDER THE TONGUE DAILY AT BEDTIME   nicotine 7 mg/24hr patch Commonly known as:  NICODERM CQ - dosed in mg/24 hr Place 7 mg onto the skin daily.   omeprazole 20 MG capsule Commonly known as:  PRILOSEC TAKE 1 CAPSULE(20 MG) BY  MOUTH TWICE DAILY BEFORE A MEAL   ondansetron 8 MG disintegrating tablet Commonly known as:  ZOFRAN-ODT Take 1 tablet (8 mg total) by mouth every 8 (eight) hours as needed for nausea or vomiting. Reported on 05/17/2015   oxandrolone 10 MG tablet Commonly known as:  OXANDRIN Take 1 tablet (10 mg total) by mouth daily with breakfast.   PARoxetine 10 MG tablet Commonly known as:  PAXIL TAKE 1 TABLET BY MOUTH DAILY FOR DEPRESSION   [name] 0.625 MG tablet Generic drug:  estrogens (conjugated) TAKE 1 TABLET BY MOUTH EVERY DAY FOR 21 DAYS, DO NOT TAKE FOR 7 DAYS AS DIRECTED   traMADol 50 MG tablet Commonly known as:  ULTRAM Take 1 tablet (50 mg total) by mouth every 6 (six) hours as needed.   traZODone 50 MG tablet Commonly known as:   DESYREL TAKE 1 TABLET(50 MG) BY MOUTH AT BEDTIME. NEED FOLLOW UP       Allergies:  Allergies  Allergen Reactions  . Augmentin [Amoxicillin-Pot Clavulanate] Diarrhea    c diff  . Morphine And Related Other (See Comments)    Hallucinations and combative  . Codeine Nausea Only  . Morphine Other (See Comments)    delusions delusions    Past Medical History, Surgical history, Social history, and Family History were reviewed and updated.  Review of Systems:  Review of Systems  Constitutional: Negative.   HENT: Negative.   Eyes: Negative.   Respiratory: Negative.   Cardiovascular: Negative.   Gastrointestinal: Negative.   Genitourinary: Negative.   Musculoskeletal: Negative.   Skin: Negative.   Neurological: Negative.   Endo/Heme/Allergies: Negative.   Psychiatric/Behavioral: Negative.       Physical Exam:  weight is 102 lb 8 oz (46.5 kg). Her oral temperature is 98.3 F (36.8 C). Her blood pressure is 157/75 (abnormal) and her pulse is 80. Her respiration is 18 and oxygen saturation is 97%.   Wt Readings from Last 3 Encounters:  02/12/18 102 lb 8 oz (46.5 kg)  01/08/18 103 lb 8 oz (46.9 kg)  12/01/17 102 lb (46.3 kg)    Physical Exam Vitals signs reviewed.  HENT:     Head: Normocephalic and atraumatic.  Eyes:     Pupils: Pupils are equal, round, and reactive to light.  Neck:     Musculoskeletal: Normal range of motion.  Cardiovascular:     Rate and Rhythm: Normal rate and regular rhythm.     Heart sounds: Normal heart sounds.  Pulmonary:     Effort: Pulmonary effort is normal.     Breath sounds: Normal breath sounds.  Abdominal:     General: Bowel sounds are normal.     Palpations: Abdomen is soft.  Musculoskeletal: Normal range of motion.        General: No tenderness or deformity.  Lymphadenopathy:     Cervical: No cervical adenopathy.  Skin:    General: Skin is warm and dry.     Findings: No erythema or rash.  Neurological:     Mental Status: She  is alert and oriented to person, place, and time.  Psychiatric:        Behavior: Behavior normal.        Thought Content: Thought content normal.        Judgment: Judgment normal.      Lab Results  Component Value Date   WBC 19.3 (H) 02/12/2018   HGB 12.6 02/12/2018   HCT 40.6 02/12/2018   MCV 99.0 02/12/2018   PLT 424 (  H) 02/12/2018   Lab Results  Component Value Date   FERRITIN 39 01/08/2018   IRON 68 01/08/2018   TIBC 394 01/08/2018   UIBC 326 01/08/2018   IRONPCTSAT 17 (L) 01/08/2018   Lab Results  Component Value Date   RBC 4.10 02/12/2018   No results found for: KPAFRELGTCHN, LAMBDASER, KAPLAMBRATIO No results found for: Kandis Cocking, IGMSERUM No results found for: Odetta Pink, SPEI   Chemistry      Component Value Date/Time   NA 145 01/08/2018 1316   NA 140 01/30/2017 1138   NA 136 03/07/2016 1055   K 4.6 01/08/2018 1316   K 4.2 01/30/2017 1138   K 4.1 03/07/2016 1055   CL 107 01/08/2018 1316   CL 102 01/30/2017 1138   CO2 31 01/08/2018 1316   CO2 28 01/30/2017 1138   CO2 26 03/07/2016 1055   BUN 53 (H) 01/08/2018 1316   BUN 34 (H) 01/30/2017 1138   BUN 28.7 (H) 03/07/2016 1055   CREATININE 1.90 (H) 01/08/2018 1316   CREATININE 2.7 (H) 01/30/2017 1138   CREATININE 1.3 (H) 03/07/2016 1055      Component Value Date/Time   CALCIUM 9.6 01/08/2018 1316   CALCIUM 9.3 01/30/2017 1138   CALCIUM 9.5 03/07/2016 1055   ALKPHOS 78 01/08/2018 1316   ALKPHOS 59 01/30/2017 1138   ALKPHOS 83 03/07/2016 1055   AST 28 01/08/2018 1316   AST 17 03/07/2016 1055   ALT 20 01/08/2018 1316   ALT 16 01/30/2017 1138   ALT 9 03/07/2016 1055   BILITOT 0.4 01/08/2018 1316   BILITOT 0.26 03/07/2016 1055      Impression and Plan: Marissa Cruz is a very pleasant 83 yo caucasian female with locally advanced, inoperable, stage IIIb squamous cell carcinoma of the right lung.  She had done very well with immunotherapy with  Keytruda.  She is not a candidate for chemotherapy.  I really do believe that a PET scan would help [place] out in figuring out what the prognosis is going to be.  Again, I think that weight loss will give [place] a better idea as to her gait cancer getting worse.  I think a PET scan in early January would be reasonable.  They are in agreement.  I would like to see her back in about a month or so.  She does have a Port-A-Cath in.  We will have to flushes we will see her back.  Volanda Napoleon, MD 12/12/20192:26 PM

## 2018-02-13 DIAGNOSIS — I129 Hypertensive chronic kidney disease with stage 1 through stage 4 chronic kidney disease, or unspecified chronic kidney disease: Secondary | ICD-10-CM | POA: Diagnosis not present

## 2018-02-13 DIAGNOSIS — Z452 Encounter for adjustment and management of vascular access device: Secondary | ICD-10-CM | POA: Diagnosis not present

## 2018-02-13 DIAGNOSIS — C3431 Malignant neoplasm of lower lobe, right bronchus or lung: Secondary | ICD-10-CM | POA: Diagnosis not present

## 2018-02-13 DIAGNOSIS — D649 Anemia, unspecified: Secondary | ICD-10-CM | POA: Diagnosis not present

## 2018-02-13 DIAGNOSIS — N184 Chronic kidney disease, stage 4 (severe): Secondary | ICD-10-CM | POA: Diagnosis not present

## 2018-02-13 DIAGNOSIS — R64 Cachexia: Secondary | ICD-10-CM | POA: Diagnosis not present

## 2018-02-13 LAB — IRON AND TIBC
IRON: 166 ug/dL — AB (ref 41–142)
SATURATION RATIOS: 55 % (ref 21–57)
TIBC: 303 ug/dL (ref 236–444)
UIBC: 137 ug/dL (ref 120–384)

## 2018-02-13 LAB — FERRITIN: FERRITIN: 361 ng/mL — AB (ref 11–307)

## 2018-02-15 ENCOUNTER — Other Ambulatory Visit: Payer: Self-pay | Admitting: Family Medicine

## 2018-02-16 DIAGNOSIS — Z452 Encounter for adjustment and management of vascular access device: Secondary | ICD-10-CM | POA: Diagnosis not present

## 2018-02-16 DIAGNOSIS — R64 Cachexia: Secondary | ICD-10-CM | POA: Diagnosis not present

## 2018-02-16 DIAGNOSIS — D649 Anemia, unspecified: Secondary | ICD-10-CM | POA: Diagnosis not present

## 2018-02-16 DIAGNOSIS — N184 Chronic kidney disease, stage 4 (severe): Secondary | ICD-10-CM | POA: Diagnosis not present

## 2018-02-16 DIAGNOSIS — I129 Hypertensive chronic kidney disease with stage 1 through stage 4 chronic kidney disease, or unspecified chronic kidney disease: Secondary | ICD-10-CM | POA: Diagnosis not present

## 2018-02-16 DIAGNOSIS — C3431 Malignant neoplasm of lower lobe, right bronchus or lung: Secondary | ICD-10-CM | POA: Diagnosis not present

## 2018-02-18 ENCOUNTER — Other Ambulatory Visit: Payer: Self-pay | Admitting: Hematology & Oncology

## 2018-02-18 DIAGNOSIS — F411 Generalized anxiety disorder: Secondary | ICD-10-CM

## 2018-02-19 ENCOUNTER — Other Ambulatory Visit: Payer: Self-pay | Admitting: *Deleted

## 2018-02-19 DIAGNOSIS — F411 Generalized anxiety disorder: Secondary | ICD-10-CM

## 2018-02-21 DIAGNOSIS — C3431 Malignant neoplasm of lower lobe, right bronchus or lung: Secondary | ICD-10-CM | POA: Diagnosis not present

## 2018-02-21 DIAGNOSIS — N184 Chronic kidney disease, stage 4 (severe): Secondary | ICD-10-CM | POA: Diagnosis not present

## 2018-02-21 DIAGNOSIS — R64 Cachexia: Secondary | ICD-10-CM | POA: Diagnosis not present

## 2018-02-21 DIAGNOSIS — D649 Anemia, unspecified: Secondary | ICD-10-CM | POA: Diagnosis not present

## 2018-02-21 DIAGNOSIS — Z452 Encounter for adjustment and management of vascular access device: Secondary | ICD-10-CM | POA: Diagnosis not present

## 2018-02-21 DIAGNOSIS — I129 Hypertensive chronic kidney disease with stage 1 through stage 4 chronic kidney disease, or unspecified chronic kidney disease: Secondary | ICD-10-CM | POA: Diagnosis not present

## 2018-02-23 DIAGNOSIS — D649 Anemia, unspecified: Secondary | ICD-10-CM | POA: Diagnosis not present

## 2018-02-23 DIAGNOSIS — R64 Cachexia: Secondary | ICD-10-CM | POA: Diagnosis not present

## 2018-02-23 DIAGNOSIS — I129 Hypertensive chronic kidney disease with stage 1 through stage 4 chronic kidney disease, or unspecified chronic kidney disease: Secondary | ICD-10-CM | POA: Diagnosis not present

## 2018-02-23 DIAGNOSIS — N184 Chronic kidney disease, stage 4 (severe): Secondary | ICD-10-CM | POA: Diagnosis not present

## 2018-02-23 DIAGNOSIS — C3431 Malignant neoplasm of lower lobe, right bronchus or lung: Secondary | ICD-10-CM | POA: Diagnosis not present

## 2018-02-23 DIAGNOSIS — Z452 Encounter for adjustment and management of vascular access device: Secondary | ICD-10-CM | POA: Diagnosis not present

## 2018-02-28 ENCOUNTER — Other Ambulatory Visit: Payer: Self-pay | Admitting: Family Medicine

## 2018-03-02 DIAGNOSIS — I129 Hypertensive chronic kidney disease with stage 1 through stage 4 chronic kidney disease, or unspecified chronic kidney disease: Secondary | ICD-10-CM | POA: Diagnosis not present

## 2018-03-02 DIAGNOSIS — Z452 Encounter for adjustment and management of vascular access device: Secondary | ICD-10-CM | POA: Diagnosis not present

## 2018-03-02 DIAGNOSIS — N184 Chronic kidney disease, stage 4 (severe): Secondary | ICD-10-CM | POA: Diagnosis not present

## 2018-03-02 DIAGNOSIS — C3431 Malignant neoplasm of lower lobe, right bronchus or lung: Secondary | ICD-10-CM | POA: Diagnosis not present

## 2018-03-02 DIAGNOSIS — R64 Cachexia: Secondary | ICD-10-CM | POA: Diagnosis not present

## 2018-03-02 DIAGNOSIS — D649 Anemia, unspecified: Secondary | ICD-10-CM | POA: Diagnosis not present

## 2018-03-03 DIAGNOSIS — Z452 Encounter for adjustment and management of vascular access device: Secondary | ICD-10-CM | POA: Diagnosis not present

## 2018-03-03 DIAGNOSIS — D649 Anemia, unspecified: Secondary | ICD-10-CM | POA: Diagnosis not present

## 2018-03-03 DIAGNOSIS — C3431 Malignant neoplasm of lower lobe, right bronchus or lung: Secondary | ICD-10-CM | POA: Diagnosis not present

## 2018-03-03 DIAGNOSIS — N184 Chronic kidney disease, stage 4 (severe): Secondary | ICD-10-CM | POA: Diagnosis not present

## 2018-03-03 DIAGNOSIS — R64 Cachexia: Secondary | ICD-10-CM | POA: Diagnosis not present

## 2018-03-03 DIAGNOSIS — I129 Hypertensive chronic kidney disease with stage 1 through stage 4 chronic kidney disease, or unspecified chronic kidney disease: Secondary | ICD-10-CM | POA: Diagnosis not present

## 2018-03-04 DIAGNOSIS — D649 Anemia, unspecified: Secondary | ICD-10-CM | POA: Diagnosis not present

## 2018-03-04 DIAGNOSIS — I129 Hypertensive chronic kidney disease with stage 1 through stage 4 chronic kidney disease, or unspecified chronic kidney disease: Secondary | ICD-10-CM | POA: Diagnosis not present

## 2018-03-04 DIAGNOSIS — Z87891 Personal history of nicotine dependence: Secondary | ICD-10-CM | POA: Diagnosis not present

## 2018-03-04 DIAGNOSIS — N184 Chronic kidney disease, stage 4 (severe): Secondary | ICD-10-CM | POA: Diagnosis not present

## 2018-03-04 DIAGNOSIS — Z452 Encounter for adjustment and management of vascular access device: Secondary | ICD-10-CM | POA: Diagnosis not present

## 2018-03-04 DIAGNOSIS — Z85819 Personal history of malignant neoplasm of unspecified site of lip, oral cavity, and pharynx: Secondary | ICD-10-CM | POA: Diagnosis not present

## 2018-03-04 DIAGNOSIS — R64 Cachexia: Secondary | ICD-10-CM | POA: Diagnosis not present

## 2018-03-04 DIAGNOSIS — C3431 Malignant neoplasm of lower lobe, right bronchus or lung: Secondary | ICD-10-CM | POA: Diagnosis not present

## 2018-03-04 DIAGNOSIS — Z8521 Personal history of malignant neoplasm of larynx: Secondary | ICD-10-CM | POA: Diagnosis not present

## 2018-03-05 DIAGNOSIS — R64 Cachexia: Secondary | ICD-10-CM | POA: Diagnosis not present

## 2018-03-05 DIAGNOSIS — D649 Anemia, unspecified: Secondary | ICD-10-CM | POA: Diagnosis not present

## 2018-03-05 DIAGNOSIS — Z452 Encounter for adjustment and management of vascular access device: Secondary | ICD-10-CM | POA: Diagnosis not present

## 2018-03-05 DIAGNOSIS — C3431 Malignant neoplasm of lower lobe, right bronchus or lung: Secondary | ICD-10-CM | POA: Diagnosis not present

## 2018-03-05 DIAGNOSIS — I129 Hypertensive chronic kidney disease with stage 1 through stage 4 chronic kidney disease, or unspecified chronic kidney disease: Secondary | ICD-10-CM | POA: Diagnosis not present

## 2018-03-05 DIAGNOSIS — N184 Chronic kidney disease, stage 4 (severe): Secondary | ICD-10-CM | POA: Diagnosis not present

## 2018-03-08 DIAGNOSIS — D649 Anemia, unspecified: Secondary | ICD-10-CM | POA: Diagnosis not present

## 2018-03-08 DIAGNOSIS — N184 Chronic kidney disease, stage 4 (severe): Secondary | ICD-10-CM | POA: Diagnosis not present

## 2018-03-08 DIAGNOSIS — R64 Cachexia: Secondary | ICD-10-CM | POA: Diagnosis not present

## 2018-03-08 DIAGNOSIS — I129 Hypertensive chronic kidney disease with stage 1 through stage 4 chronic kidney disease, or unspecified chronic kidney disease: Secondary | ICD-10-CM | POA: Diagnosis not present

## 2018-03-08 DIAGNOSIS — C3431 Malignant neoplasm of lower lobe, right bronchus or lung: Secondary | ICD-10-CM | POA: Diagnosis not present

## 2018-03-08 DIAGNOSIS — Z452 Encounter for adjustment and management of vascular access device: Secondary | ICD-10-CM | POA: Diagnosis not present

## 2018-03-09 DIAGNOSIS — I129 Hypertensive chronic kidney disease with stage 1 through stage 4 chronic kidney disease, or unspecified chronic kidney disease: Secondary | ICD-10-CM | POA: Diagnosis not present

## 2018-03-09 DIAGNOSIS — N184 Chronic kidney disease, stage 4 (severe): Secondary | ICD-10-CM | POA: Diagnosis not present

## 2018-03-09 DIAGNOSIS — D649 Anemia, unspecified: Secondary | ICD-10-CM | POA: Diagnosis not present

## 2018-03-09 DIAGNOSIS — C3431 Malignant neoplasm of lower lobe, right bronchus or lung: Secondary | ICD-10-CM | POA: Diagnosis not present

## 2018-03-09 DIAGNOSIS — Z452 Encounter for adjustment and management of vascular access device: Secondary | ICD-10-CM | POA: Diagnosis not present

## 2018-03-09 DIAGNOSIS — R64 Cachexia: Secondary | ICD-10-CM | POA: Diagnosis not present

## 2018-03-10 ENCOUNTER — Encounter: Payer: Self-pay | Admitting: Hematology & Oncology

## 2018-03-10 DIAGNOSIS — Z452 Encounter for adjustment and management of vascular access device: Secondary | ICD-10-CM | POA: Diagnosis not present

## 2018-03-10 DIAGNOSIS — D649 Anemia, unspecified: Secondary | ICD-10-CM | POA: Diagnosis not present

## 2018-03-10 DIAGNOSIS — R64 Cachexia: Secondary | ICD-10-CM | POA: Diagnosis not present

## 2018-03-10 DIAGNOSIS — N184 Chronic kidney disease, stage 4 (severe): Secondary | ICD-10-CM | POA: Diagnosis not present

## 2018-03-10 DIAGNOSIS — C3431 Malignant neoplasm of lower lobe, right bronchus or lung: Secondary | ICD-10-CM | POA: Diagnosis not present

## 2018-03-10 DIAGNOSIS — I129 Hypertensive chronic kidney disease with stage 1 through stage 4 chronic kidney disease, or unspecified chronic kidney disease: Secondary | ICD-10-CM | POA: Diagnosis not present

## 2018-03-12 ENCOUNTER — Ambulatory Visit (HOSPITAL_COMMUNITY): Payer: Federal, State, Local not specified - PPO

## 2018-03-12 DIAGNOSIS — Z452 Encounter for adjustment and management of vascular access device: Secondary | ICD-10-CM | POA: Diagnosis not present

## 2018-03-12 DIAGNOSIS — N184 Chronic kidney disease, stage 4 (severe): Secondary | ICD-10-CM | POA: Diagnosis not present

## 2018-03-12 DIAGNOSIS — I129 Hypertensive chronic kidney disease with stage 1 through stage 4 chronic kidney disease, or unspecified chronic kidney disease: Secondary | ICD-10-CM | POA: Diagnosis not present

## 2018-03-12 DIAGNOSIS — R64 Cachexia: Secondary | ICD-10-CM | POA: Diagnosis not present

## 2018-03-12 DIAGNOSIS — D649 Anemia, unspecified: Secondary | ICD-10-CM | POA: Diagnosis not present

## 2018-03-12 DIAGNOSIS — C3431 Malignant neoplasm of lower lobe, right bronchus or lung: Secondary | ICD-10-CM | POA: Diagnosis not present

## 2018-03-13 ENCOUNTER — Telehealth: Payer: Self-pay | Admitting: *Deleted

## 2018-03-13 DIAGNOSIS — I129 Hypertensive chronic kidney disease with stage 1 through stage 4 chronic kidney disease, or unspecified chronic kidney disease: Secondary | ICD-10-CM | POA: Diagnosis not present

## 2018-03-13 DIAGNOSIS — C3431 Malignant neoplasm of lower lobe, right bronchus or lung: Secondary | ICD-10-CM | POA: Diagnosis not present

## 2018-03-13 DIAGNOSIS — Z452 Encounter for adjustment and management of vascular access device: Secondary | ICD-10-CM | POA: Diagnosis not present

## 2018-03-13 DIAGNOSIS — R64 Cachexia: Secondary | ICD-10-CM | POA: Diagnosis not present

## 2018-03-13 DIAGNOSIS — N184 Chronic kidney disease, stage 4 (severe): Secondary | ICD-10-CM | POA: Diagnosis not present

## 2018-03-13 DIAGNOSIS — D649 Anemia, unspecified: Secondary | ICD-10-CM | POA: Diagnosis not present

## 2018-03-13 NOTE — Telephone Encounter (Signed)
Received voice mail message from Tanda Rockers, Hospice nurse, stating,"the family has noticed a big decline in the patient and want her moved to [facility]. She was moved today. We want Dr. Marin Olp to know this. Return number is [phone]."

## 2018-03-14 DIAGNOSIS — C3431 Malignant neoplasm of lower lobe, right bronchus or lung: Secondary | ICD-10-CM | POA: Diagnosis not present

## 2018-03-14 DIAGNOSIS — Z452 Encounter for adjustment and management of vascular access device: Secondary | ICD-10-CM | POA: Diagnosis not present

## 2018-03-14 DIAGNOSIS — D649 Anemia, unspecified: Secondary | ICD-10-CM | POA: Diagnosis not present

## 2018-03-14 DIAGNOSIS — N184 Chronic kidney disease, stage 4 (severe): Secondary | ICD-10-CM | POA: Diagnosis not present

## 2018-03-14 DIAGNOSIS — I129 Hypertensive chronic kidney disease with stage 1 through stage 4 chronic kidney disease, or unspecified chronic kidney disease: Secondary | ICD-10-CM | POA: Diagnosis not present

## 2018-03-14 DIAGNOSIS — R64 Cachexia: Secondary | ICD-10-CM | POA: Diagnosis not present

## 2018-03-15 ENCOUNTER — Encounter: Payer: Self-pay | Admitting: Hematology & Oncology

## 2018-03-15 ENCOUNTER — Encounter: Payer: Self-pay | Admitting: Family Medicine

## 2018-03-16 ENCOUNTER — Telehealth: Payer: Self-pay

## 2018-03-16 NOTE — Telephone Encounter (Signed)
This is Kennedy Bucker, Beecher Mcardle Fromm's daughter.   Would you please let Dr. Carollee Herter know that mom passed away 2022/05/27 night (03/23/2018) peacefully at [facility] in [place].    Thank you  Erline Levine  [phone]

## 2018-03-17 NOTE — Telephone Encounter (Signed)
Dr. Etter Sjogren spoke with daughter yesterday.

## 2018-03-18 ENCOUNTER — Encounter: Payer: Self-pay | Admitting: Family

## 2018-03-26 ENCOUNTER — Ambulatory Visit: Payer: Federal, State, Local not specified - PPO | Admitting: Hematology & Oncology

## 2018-03-26 ENCOUNTER — Other Ambulatory Visit: Payer: Federal, State, Local not specified - PPO

## 2018-04-04 DEATH — deceased

## 2018-10-09 IMAGING — CT CT CHEST W/O CM
2 of 3 series · 15 of 36 positions shown, 18 images · non-contrast
Comparison: Chest x-ray of 12/18/2015, 12/06/2005, and 06/28/2014

CLINICAL DATA: Opacity questioned at the right lung base on chest
x-ray

EXAM:
CT CHEST WITHOUT CONTRAST
TECHNIQUE: Multidetector CT imaging of the chest was performed following the
standard protocol without IV contrast.

[Series 2: thorax · axial · 0.69mm/px · z∈[-357,-73]mm · 12 of 168 slices shown, 15 images]
[im 13/168  mediastinal]
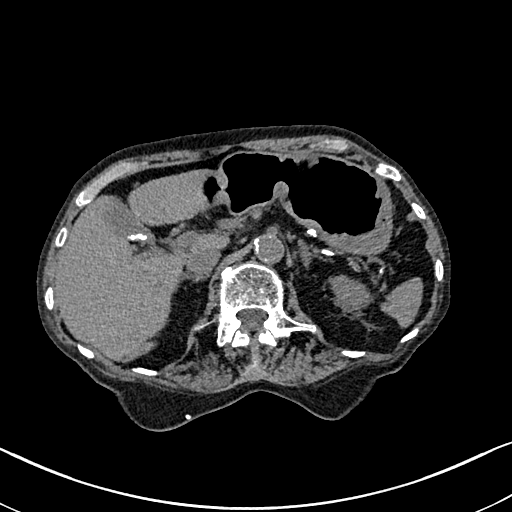
[im 13/168  lung]
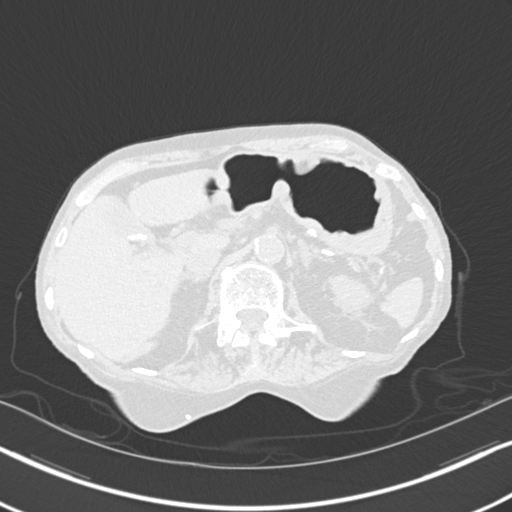
[im 25/168  lung]
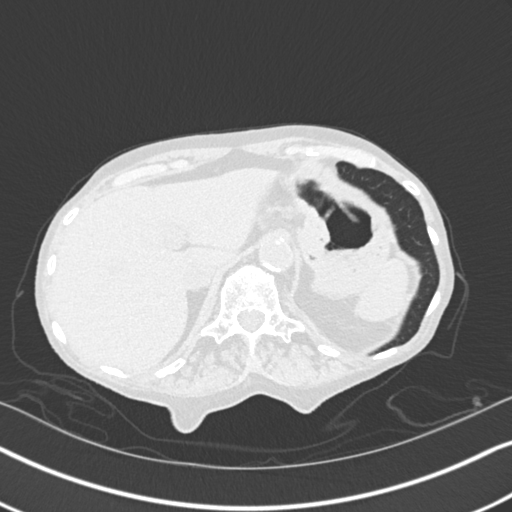
[im 38/168  lung]
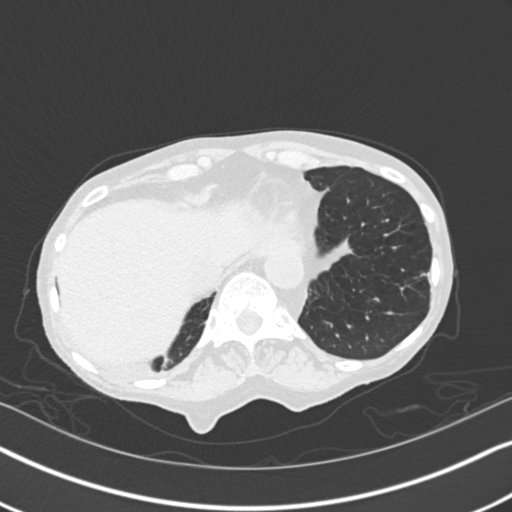
[im 50/168  lung]
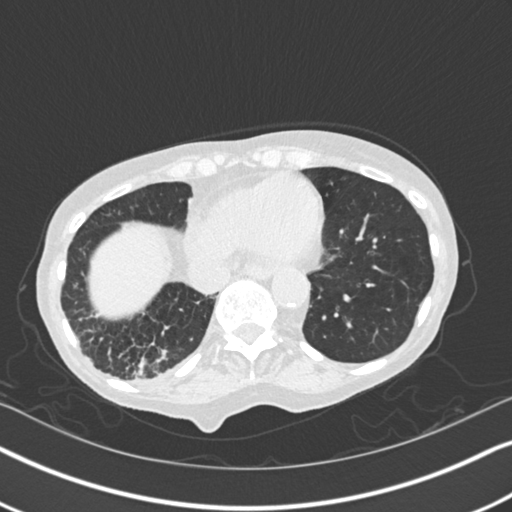
[im 62/168  mediastinal]
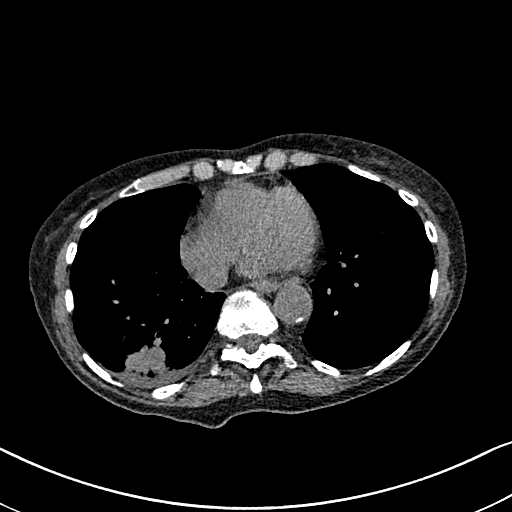
[im 62/168  lung]
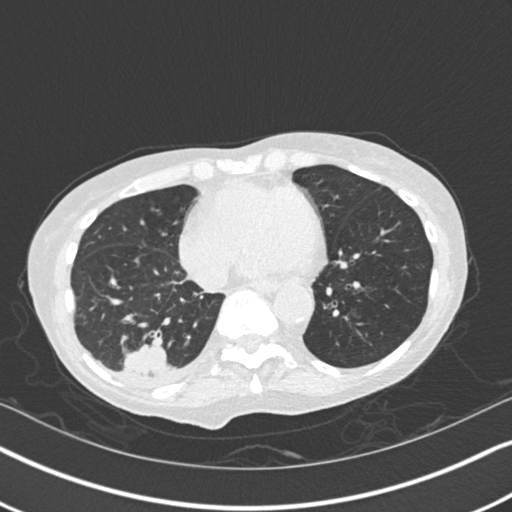
[im 75/168  lung]
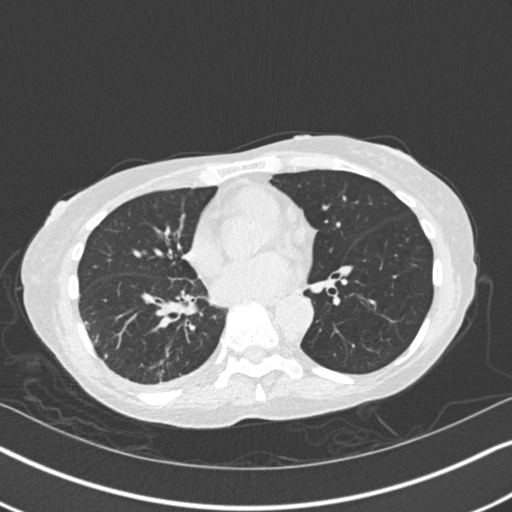
[im 93/168  lung]
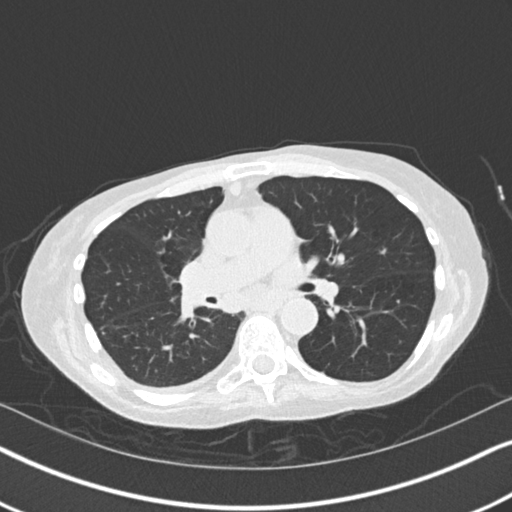
[im 106/168  lung]
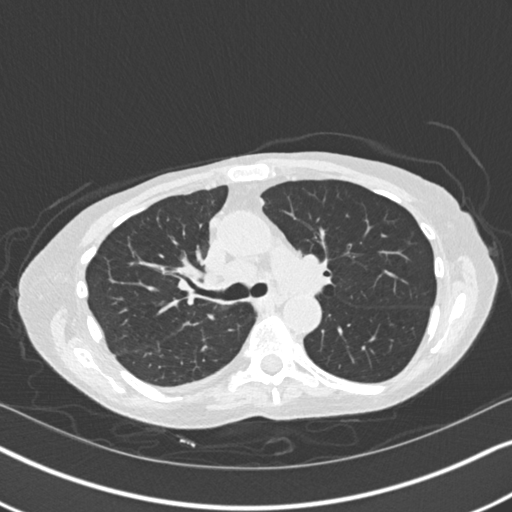
[im 118/168  mediastinal]
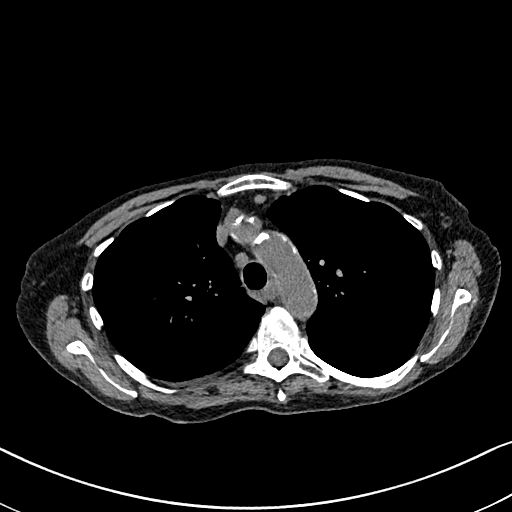
[im 118/168  lung]
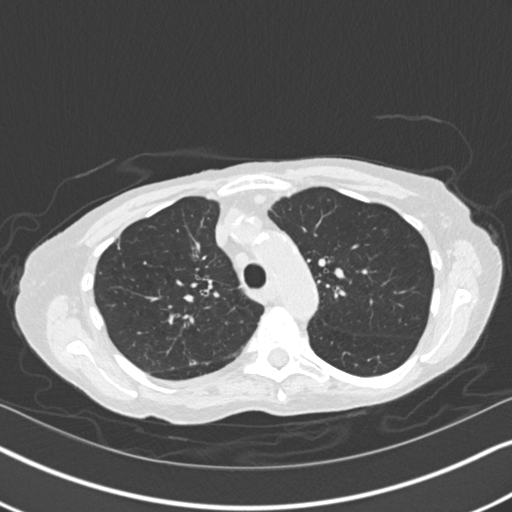
[im 130/168  lung]
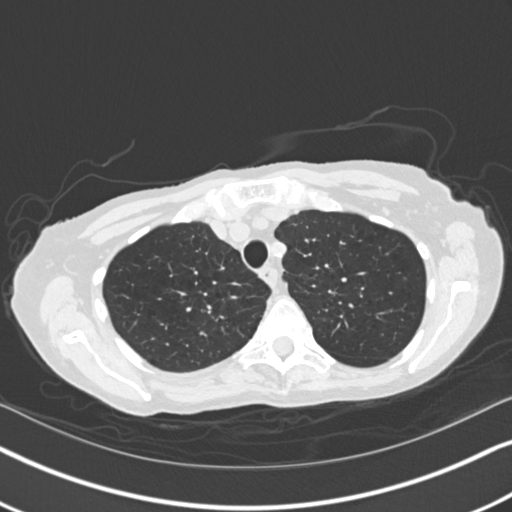
[im 143/168  lung]
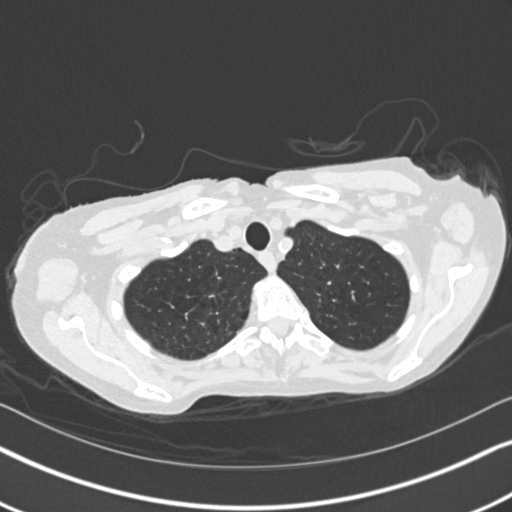
[im 155/168  lung]
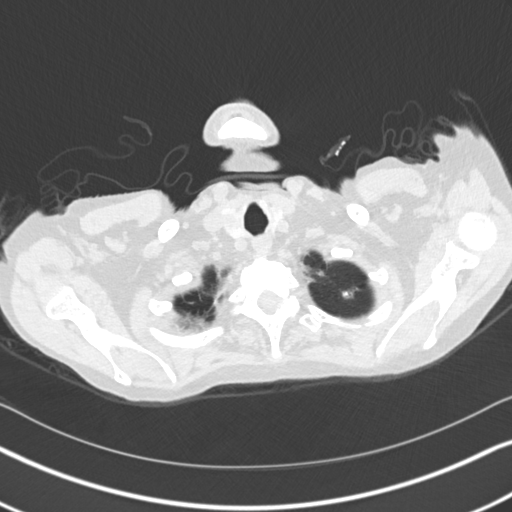

[Series 5: coronal · coronal · 0.67mm/px · 3 of 98 slices shown]
[im 20/98  lung]
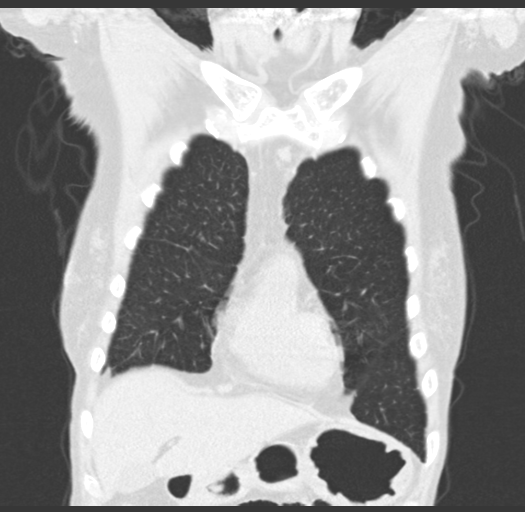
[im 39/98  lung]
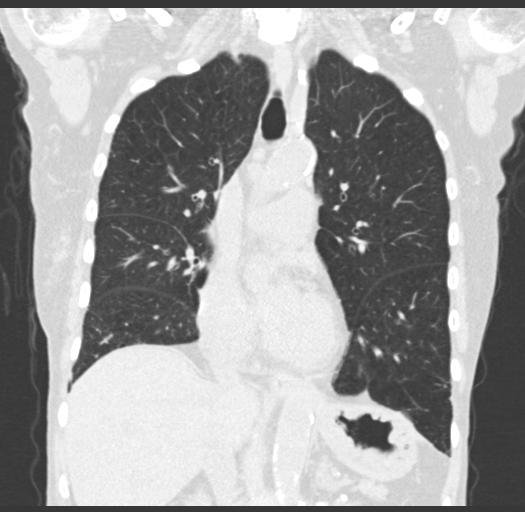
[im 59/98  lung]
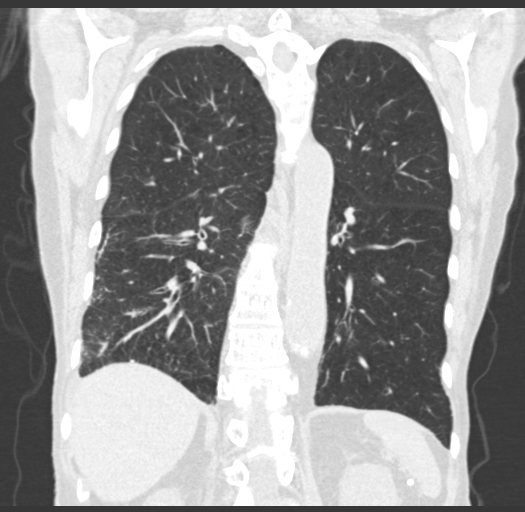

[15 of 36 positions shown; findings below may reference images not displayed]

FINDINGS: Cardiovascular: The heart is within upper limits of normal. No
pericardial effusion is seen. The mid ascending thoracic aorta
measures 2.9 cm diameter. The pulmonary arteries are unremarkable on
this unenhanced study.

Mediastinum/Nodes: There are pathologically enlarged lymph nodes
present the largest in the precarinal region measuring 14 mm in
short axis diameter on image number 63 series 2. Also there appears
to be subcarinal adenopathy, with a node in the azygo- esophageal
recess measuring 15 mm in short axis diameter. Also, there is a
rounded soft tissue structure adjacent to the descending thoracic
aorta on image 101 series 2 measuring 10 mm in short axis diameter.
This could represent and an enlarged node or possibly aneurysmal
focal dilatation of the descending thoracic aorta. CT angiogram is
recommended to assess further. The hilar regions are difficult to
assess on this unenhanced study. A small node in the anterior fat
planes anterior to the ascending aorta measures 7 mm in short axis
diameter.

Lungs/Pleura: Changes of centrilobular emphysema are noted primarily
involving the upper lobes. However, at the site questioned on recent
chest x-ray there is a lobular soft tissue mass posteriorly within
the right lower lobe measuring approximately 2.8 x 2.2 cm on image
108 series 3. This is worrisome for right lower lobe lung neoplasm.
There is a small amount of adjacent pleural fluid present. No
additional pulmonary nodule is noted.

Upper Abdomen: Gallstones are noted layering within the gallbladder.
No other abnormality within the upper abdomen is evident.

Musculoskeletal: The thoracic vertebrae are in normal alignment.
There is slight compression deformity of L1 vertebral body of
uncertain age most likely old with some spurring present.
IMPRESSION: 1. Lobular mass corresponds to the opacity noted on recent chest
x-ray within the posterior right lower lobe worrisome for primary
lung carcinoma.
2. Small right pleural effusion.
3. Mediastinal adenopathy.
4. Possible adenopathy versus focal aneurysmal dilatation of the
descending thoracic aorta. Consider CT angio chest to assess
further.
5. Incidental gallstones.

## 2018-11-20 IMAGING — CR DG CHEST 1V
2 series · 2 of 2 positions shown · non-contrast
Comparison: CT biopsy of 02/01/2016

CLINICAL DATA: Right lung biopsy under CT guidance, evaluate for
pneumothorax

EXAM:
CHEST 1 VIEW

[x chest ap (1 of 2)]
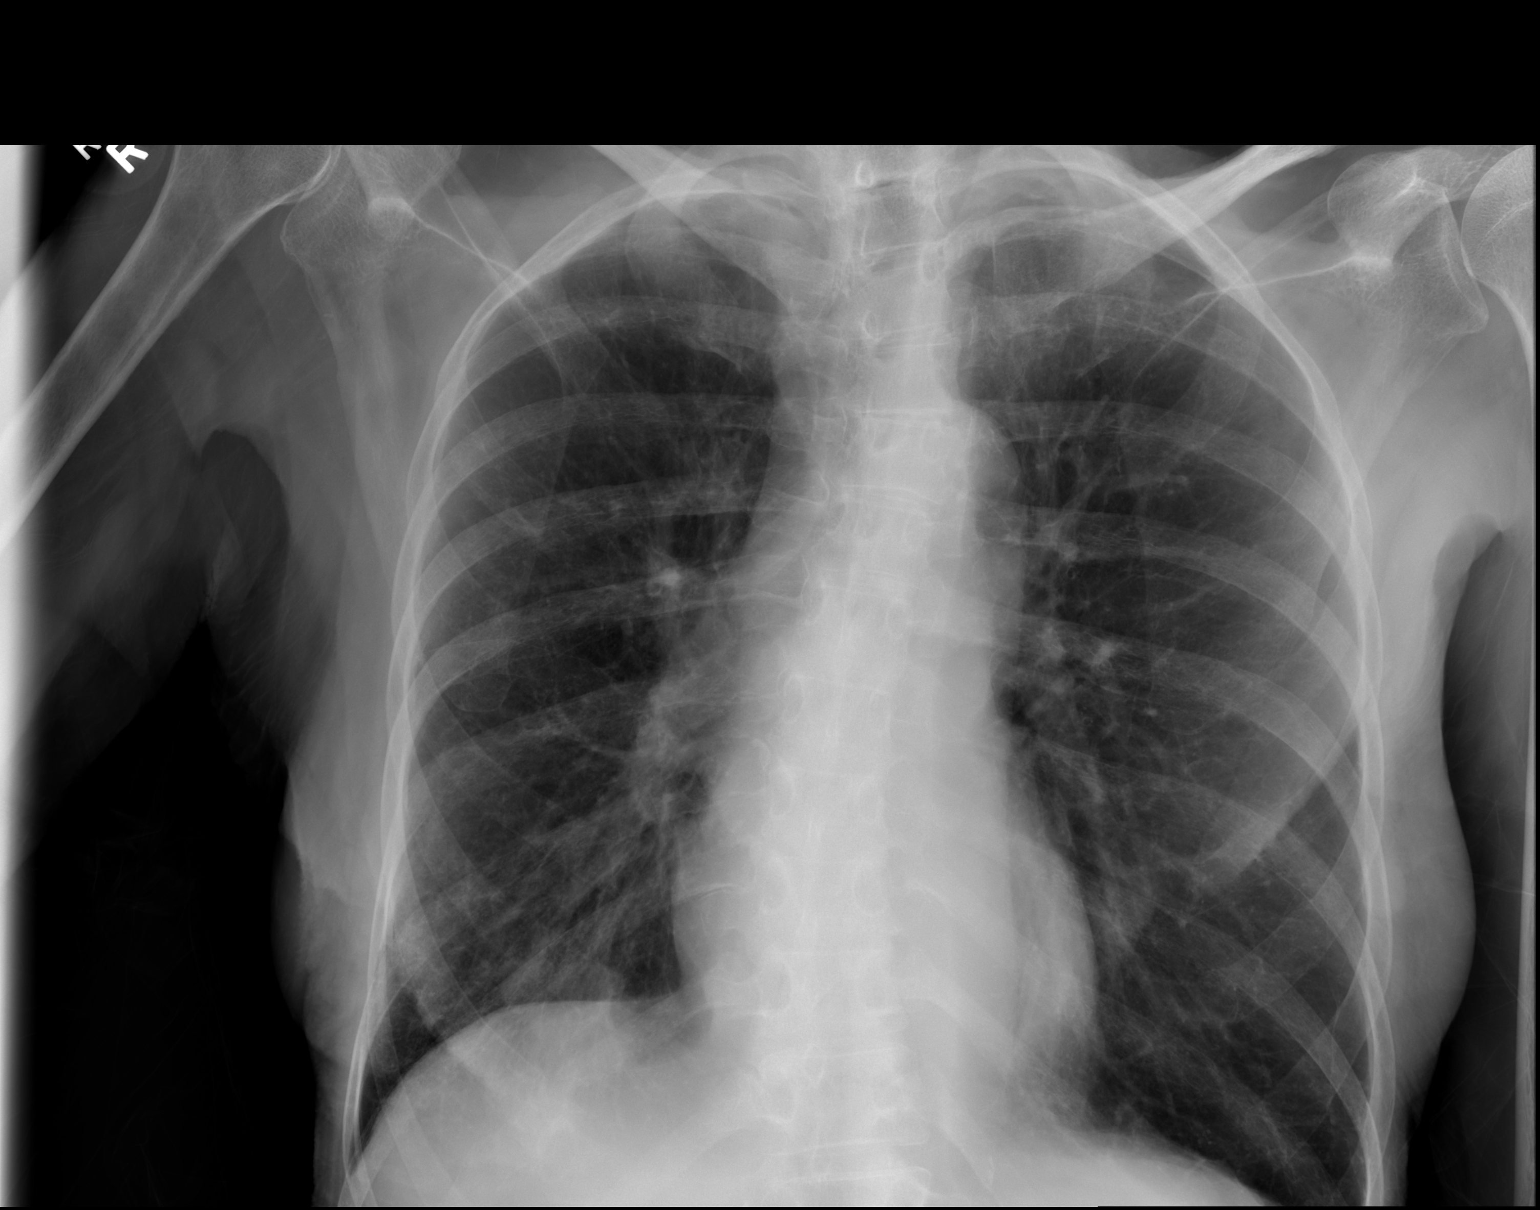

[x chest ap (2 of 2)]
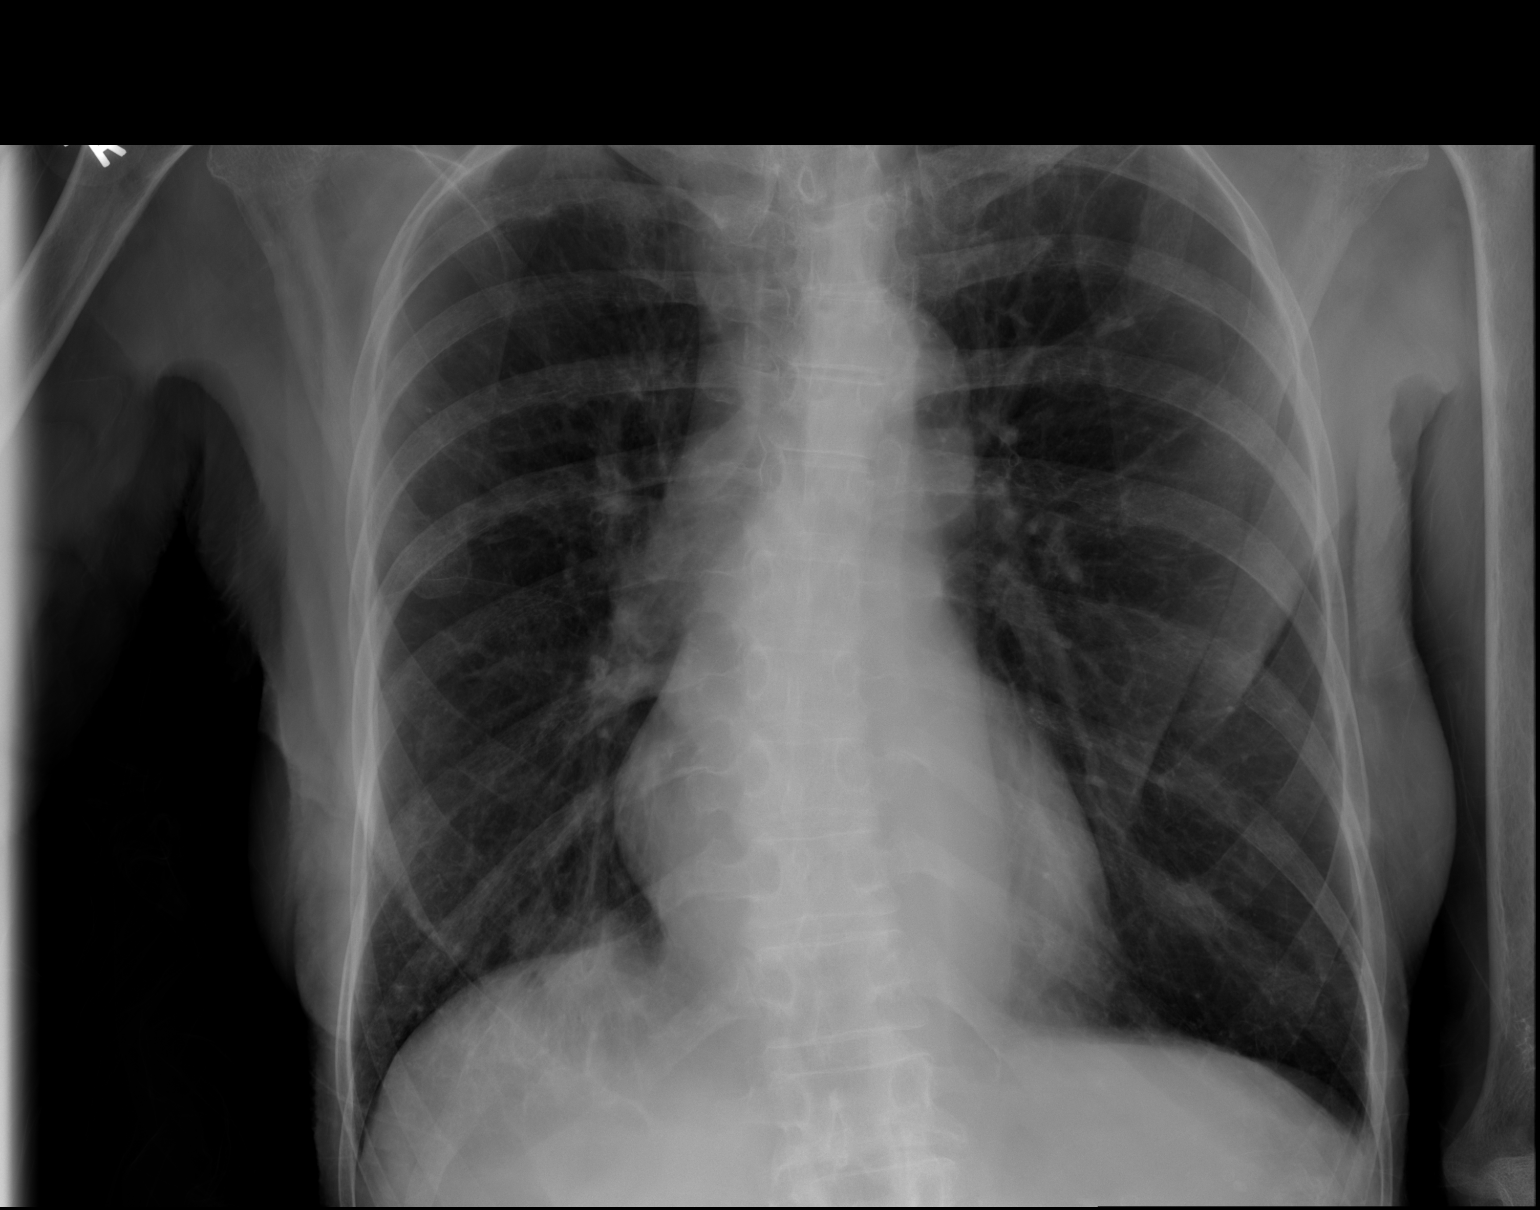

[2 of 2 positions shown; findings below may reference images not displayed]

FINDINGS: The mass at the right lung base is well visualized. No pneumothorax
is seen. No effusion is noted. The left lung is clear.
IMPRESSION: No pneumothorax after right lung mass biopsy.
